# Patient Record
Sex: Male | Born: 1935 | Race: White | Hispanic: No | Marital: Married | Smoking: Never smoker
Health system: Southern US, Community
[De-identification: ages and names within clinical notes are randomized; demographics above are authoritative.]

## PROBLEM LIST (undated history)

## (undated) DIAGNOSIS — Z974 Presence of external hearing-aid: Secondary | ICD-10-CM

## (undated) DIAGNOSIS — C439 Malignant melanoma of skin, unspecified: Secondary | ICD-10-CM

## (undated) DIAGNOSIS — Z973 Presence of spectacles and contact lenses: Secondary | ICD-10-CM

## (undated) DIAGNOSIS — E7800 Pure hypercholesterolemia, unspecified: Secondary | ICD-10-CM

## (undated) DIAGNOSIS — L570 Actinic keratosis: Secondary | ICD-10-CM

## (undated) DIAGNOSIS — Z972 Presence of dental prosthetic device (complete) (partial): Secondary | ICD-10-CM

## (undated) DIAGNOSIS — K635 Polyp of colon: Secondary | ICD-10-CM

## (undated) DIAGNOSIS — K8020 Calculus of gallbladder without cholecystitis without obstruction: Secondary | ICD-10-CM

## (undated) DIAGNOSIS — R809 Proteinuria, unspecified: Secondary | ICD-10-CM

## (undated) DIAGNOSIS — S46119A Strain of muscle, fascia and tendon of long head of biceps, unspecified arm, initial encounter: Secondary | ICD-10-CM

## (undated) DIAGNOSIS — N529 Male erectile dysfunction, unspecified: Secondary | ICD-10-CM

## (undated) DIAGNOSIS — K449 Diaphragmatic hernia without obstruction or gangrene: Secondary | ICD-10-CM

## (undated) DIAGNOSIS — I4891 Unspecified atrial fibrillation: Secondary | ICD-10-CM

## (undated) DIAGNOSIS — K5790 Diverticulosis of intestine, part unspecified, without perforation or abscess without bleeding: Secondary | ICD-10-CM

## (undated) DIAGNOSIS — N400 Enlarged prostate without lower urinary tract symptoms: Secondary | ICD-10-CM

## (undated) HISTORY — DX: Actinic keratosis: L57.0

## (undated) HISTORY — PX: CATARACT EXTRACTION: SUR2

## (undated) HISTORY — DX: Male erectile dysfunction, unspecified: N52.9

## (undated) HISTORY — PX: COLONOSCOPY: SHX174

## (undated) HISTORY — DX: Malignant melanoma of skin, unspecified: C43.9

## (undated) HISTORY — DX: Pure hypercholesterolemia, unspecified: E78.00

## (undated) HISTORY — DX: Proteinuria, unspecified: R80.9

## (undated) HISTORY — DX: Benign prostatic hyperplasia without lower urinary tract symptoms: N40.0

## (undated) HISTORY — DX: Diaphragmatic hernia without obstruction or gangrene: K44.9

## (undated) HISTORY — DX: Strain of muscle, fascia and tendon of long head of biceps, unspecified arm, initial encounter: S46.119A

## (undated) HISTORY — DX: Polyp of colon: K63.5

---
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
## 1987-02-25 HISTORY — PX: INGUINAL HERNIA REPAIR: SHX194

## 2002-10-11 ENCOUNTER — Encounter (INDEPENDENT_AMBULATORY_CARE_PROVIDER_SITE_OTHER): Payer: Self-pay | Admitting: Specialist

## 2002-10-11 ENCOUNTER — Ambulatory Visit (HOSPITAL_COMMUNITY): Admission: RE | Admit: 2002-10-11 | Discharge: 2002-10-11 | Payer: Self-pay | Admitting: Gastroenterology

## 2003-02-25 HISTORY — PX: TOTAL KNEE ARTHROPLASTY: SHX125

## 2003-03-08 ENCOUNTER — Inpatient Hospital Stay (HOSPITAL_COMMUNITY): Admission: RE | Admit: 2003-03-08 | Discharge: 2003-03-13 | Payer: Self-pay | Admitting: Orthopedic Surgery

## 2004-02-25 DIAGNOSIS — E7800 Pure hypercholesterolemia, unspecified: Secondary | ICD-10-CM

## 2004-02-25 HISTORY — DX: Pure hypercholesterolemia, unspecified: E78.00

## 2009-01-24 DIAGNOSIS — S46119A Strain of muscle, fascia and tendon of long head of biceps, unspecified arm, initial encounter: Secondary | ICD-10-CM

## 2009-01-24 HISTORY — DX: Strain of muscle, fascia and tendon of long head of biceps, unspecified arm, initial encounter: S46.119A

## 2010-02-24 HISTORY — PX: TOTAL KNEE ARTHROPLASTY: SHX125

## 2010-03-25 LAB — DIFFERENTIAL
Basophils Relative: 0 % (ref 0–1)
Eosinophils Absolute: 0.1 10*3/uL (ref 0.0–0.7)
Eosinophils Relative: 2 % (ref 0–5)
Lymphs Abs: 1.2 10*3/uL (ref 0.7–4.0)
Monocytes Relative: 7 % (ref 3–12)

## 2010-03-25 LAB — COMPREHENSIVE METABOLIC PANEL
Albumin: 3.9 g/dL (ref 3.5–5.2)
Alkaline Phosphatase: 70 U/L (ref 39–117)
BUN: 19 mg/dL (ref 6–23)
CO2: 26 mEq/L (ref 19–32)
Chloride: 105 mEq/L (ref 96–112)
Glucose, Bld: 108 mg/dL — ABNORMAL HIGH (ref 70–99)
Potassium: 4 mEq/L (ref 3.5–5.1)
Total Bilirubin: 0.6 mg/dL (ref 0.3–1.2)

## 2010-03-25 LAB — CBC
MCH: 31.9 pg (ref 26.0–34.0)
MCHC: 34.8 g/dL (ref 30.0–36.0)
MCV: 91.5 fL (ref 78.0–100.0)
Platelets: 201 10*3/uL (ref 150–400)
RDW: 12.6 % (ref 11.5–15.5)

## 2010-03-25 LAB — URINALYSIS, ROUTINE W REFLEX MICROSCOPIC
Bilirubin Urine: NEGATIVE
Ketones, ur: NEGATIVE mg/dL
Nitrite: NEGATIVE
Protein, ur: NEGATIVE mg/dL
Urobilinogen, UA: 0.2 mg/dL (ref 0.0–1.0)

## 2010-03-25 LAB — SURGICAL PCR SCREEN: MRSA, PCR: NEGATIVE

## 2010-03-25 LAB — APTT: aPTT: 28 seconds (ref 24–37)

## 2010-04-03 ENCOUNTER — Inpatient Hospital Stay (HOSPITAL_COMMUNITY): Payer: Medicare Other

## 2010-04-03 ENCOUNTER — Inpatient Hospital Stay (HOSPITAL_COMMUNITY)
Admission: RE | Admit: 2010-04-03 | Discharge: 2010-04-08 | DRG: 470 | Disposition: A | Discharge status: Home Health | Payer: Medicare Other | Attending: Orthopedic Surgery | Admitting: Orthopedic Surgery

## 2010-04-03 DIAGNOSIS — Z96659 Presence of unspecified artificial knee joint: Secondary | ICD-10-CM

## 2010-04-03 DIAGNOSIS — E119 Type 2 diabetes mellitus without complications: Secondary | ICD-10-CM | POA: Diagnosis present

## 2010-04-03 DIAGNOSIS — M1710 Unilateral primary osteoarthritis, unspecified knee: Principal | ICD-10-CM | POA: Diagnosis present

## 2010-04-03 DIAGNOSIS — E7800 Pure hypercholesterolemia, unspecified: Secondary | ICD-10-CM | POA: Diagnosis present

## 2010-04-03 DIAGNOSIS — M959 Acquired deformity of musculoskeletal system, unspecified: Secondary | ICD-10-CM | POA: Diagnosis present

## 2010-04-03 DIAGNOSIS — I10 Essential (primary) hypertension: Secondary | ICD-10-CM | POA: Diagnosis present

## 2010-04-03 LAB — ABO/RH: ABO/RH(D): A POS

## 2010-04-03 LAB — GLUCOSE, CAPILLARY: Glucose-Capillary: 180 mg/dL — ABNORMAL HIGH (ref 70–99)

## 2010-04-03 LAB — HEMOGLOBIN AND HEMATOCRIT, BLOOD
HCT: 36.3 % — ABNORMAL LOW (ref 39.0–52.0)
Hemoglobin: 13 g/dL (ref 13.0–17.0)

## 2010-04-04 LAB — PROTIME-INR
INR: 1.18 (ref 0.00–1.49)
Prothrombin Time: 15.2 seconds (ref 11.6–15.2)

## 2010-04-04 LAB — HEMOGLOBIN AND HEMATOCRIT, BLOOD: Hemoglobin: 10.9 g/dL — ABNORMAL LOW (ref 13.0–17.0)

## 2010-04-04 LAB — GLUCOSE, CAPILLARY

## 2010-04-05 LAB — GLUCOSE, CAPILLARY: Glucose-Capillary: 123 mg/dL — ABNORMAL HIGH (ref 70–99)

## 2010-04-06 LAB — PROTIME-INR
INR: 1.73 — ABNORMAL HIGH (ref 0.00–1.49)
Prothrombin Time: 20.4 seconds — ABNORMAL HIGH (ref 11.6–15.2)

## 2010-04-07 LAB — PROTIME-INR
INR: 1.75 — ABNORMAL HIGH (ref 0.00–1.49)
Prothrombin Time: 20.6 seconds — ABNORMAL HIGH (ref 11.6–15.2)

## 2010-04-07 LAB — GLUCOSE, CAPILLARY
Glucose-Capillary: 209 mg/dL — ABNORMAL HIGH (ref 70–99)
Glucose-Capillary: 165 mg/dL — ABNORMAL HIGH (ref 70–99)
Glucose-Capillary: 147 mg/dL — ABNORMAL HIGH (ref 70–99)
Glucose-Capillary: 144 mg/dL — ABNORMAL HIGH (ref 70–99)
Glucose-Capillary: 141 mg/dL — ABNORMAL HIGH (ref 70–99)
Glucose-Capillary: 109 mg/dL — ABNORMAL HIGH (ref 70–99)
Glucose-Capillary: 128 mg/dL — ABNORMAL HIGH (ref 70–99)
Glucose-Capillary: 131 mg/dL — ABNORMAL HIGH (ref 70–99)
Glucose-Capillary: 140 mg/dL — ABNORMAL HIGH (ref 70–99)

## 2010-04-08 LAB — PROTIME-INR: INR: 2.03 — ABNORMAL HIGH (ref 0.00–1.49)

## 2010-04-08 LAB — GLUCOSE, CAPILLARY: Glucose-Capillary: 147 mg/dL — ABNORMAL HIGH (ref 70–99)

## 2010-04-08 NOTE — H&P (Addendum)
NAME:  Andre Walker, Andre Walker                 ACCOUNT NO.:  [id number]  MEDICAL RECORD NO.:  [id number]           PATIENT TYPE:  I  LOCATION:  1S                           FACILITY:  [facility]  PHYSICIAN:  Andre Walker. Gioffre, M.D.DATE OF BIRTH:  [birth year]  DATE OF ADMISSION:  03/15/2010 DATE OF DISCHARGE:                             HISTORY & PHYSICAL   CHIEF COMPLAINT:  Right knee pain.  BRIEF HISTORY:  Andre Walker has been followed by Dr. Darrelyn Walker for worsening pain in his right knee.  He had left total knee done in 2005 and he has done excellent with that.  The right knee has continued to worsen.  He has failed conservative treatment.  He is now to a point where he feels it is keeping him from doing things he would like to do and he presents for right total knee arthroplasty.  Andre Walker has been cleared for surgery by his primary care physician, Dr. Cain Walker.  MEDICATION ALLERGIES:  PENICILLIN.  The patient denies food, latex ormetal allergies.  CURRENT MEDICATIONS: 1. Metformin 500 mg 1 tablet p.o. b.i.d. 2. Simvastatin 20 mg 1 tablet p.o. daily. 3. Indomethacin 25 mg 2 tablets p.o. b.i.d.  He will discontinue this     5 days prior to surgery. 4. Terazosin 5 mg. 5. Lisinopril 5 mg.  PAST MEDICAL HISTORY: 1. End-stage arthritis of the right knee. 2. Hypertension. 3. Non-insulin dependent diabetes. 4. Hypercholesterolemia. 5. Arthritis.  PAST SURGICAL HISTORY: 1. Left total knee arthroplasty. 2. Hernia repair. 3. Appendectomy.  FAMILY HISTORY:  Father passed at the age of 83 in a car accident. Mother passed at the age of 61 also with a car accident.  SOCIAL HISTORY:  The patient is married.  He is retired.  He denies past or present use of alcohol or tobacco products.  He has 2 children.  He lives at home with his wife.  He does plan to go home following his hospital stay.  REVIEW OF SYSTEMS:  GENERAL:  Negative for fevers, chills or weight change.  HEENT/NEURO:  Negative  headache, blurred vision or insomnia. DERMATOLOGIC:  Negative for rash or lesion.  RESPIRATORY:  Negative for shortness of breath at rest or with exertion.  CARDIOVASCULAR:  Negative for chest pain or palpitations.  GI: Negative for nausea, vomiting or diarrhea.  GU: Negative for hematuria or dysuria.  MUSCULOSKELETAL: Positive for joint pain.  PHYSICAL EXAMINATION:  VITAL SIGNS:  Pulse 80, respirations 18, blood pressure 144/60 in the left arm. GENERAL:  Andre Walker is a pleasant 77 year old male, who is a stated height of 6 feet and a stated weight of 198 pounds.  He is alert and oriented x3, in no apparent distress. HEENT: Normocephalic, atraumatic.  Extraocular movements intact. NECK:  Supple.  Full range of motion without lymphadenopathy. CHEST:  Lungs are clear to auscultation bilaterally without wheezing. HEART:  Regular rate and rhythm without murmur.  S1, S2 sounds are appreciated. ABDOMEN: Bowel sounds present in all 4 quadrants.  Abdomen is soft, nontender, and nondistended to palpation. EXTREMITIES:  Right knee, he has moderate knee effusion.  No erythema.  Tenderness with palpation over the medial joint line.  Decreased range of motion, moderate crepitus throughout the range. SKIN:  Unremarkable. NEUROLOGIC:  Intact. PERIPHERAL VASCULAR:  Carotid pulses 2+ bilaterally without bruit.  RADIOGRAPHS:  AP and lateral view of the right knee reveals complete collapse of the medial joint space as well as patellofemoral involvement.  IMPRESSION:  End-stage arthritis of the right knee.  PLAN:  Right total knee arthroplasty to be performed by Dr. Darrelyn Walker.     Andre Walker, PAC   ______________________________ Andre Walker Andre Walker, M.D.    LD/MEDQ  D:  04/02/2010  T:  04/02/2010  Job:  [id number]  cc:   Andre Walker Fax: [phone]  Electronically Signed by Andre Walker M.D. on 04/08/2010 09:03:20 AM Electronically Signed by Andre Walker  on 04/08/2010 09:21:25 AM

## 2010-04-08 NOTE — Op Note (Signed)
NAME:  Andre Walker, Andre Walker                 ACCOUNT NO.:  [id number]  MEDICAL RECORD NO.:  [id number]           PATIENT TYPE:  I  LOCATION:  0005                         FACILITY:  [facility]  PHYSICIAN:  Georges Lynch. Virgia Kelner, M.D.DATE OF BIRTH:  1932-11-14  DATE OF PROCEDURE:  04/03/2010 DATE OF DISCHARGE:                              OPERATIVE REPORT   SURGEON:  Georges Lynch. Darrelyn Hillock, M.D.  ASSISTANT:  Rozell Searing, [facility]  PREOPERATIVE DIAGNOSIS:  Severe degenerative arthritis to the right knee with a varus deformity.  POSTOPERATIVE DIAGNOSIS:  Severe degenerative arthritis to the right knee with a varus deformity.  OPERATION:  Right total knee arthroplasty utilizing the DePuy system. The sizes used was a size patella 41 mm with 3 pegs.  The tibial tray was a size 5 cemented, all components were cemented by the way.  The tibial insert was a size 5, 12.5-mm thickness.  The femoral component was a size 5 right.  I did use gentamicin in the cement.  Also, we utilized some of the intercondylar cancellous bone in the bone graft up into the femoral canal as he had a very small few millimeters penetration of the medial femoral cortex.  DESCRIPTION OF PROCEDURE:  Under general anesthesia, routine orthopedic prepping and draping of the right lower extremity was carried out. Initially, he had an attempt by the anesthesiologist, Dr. Shireen Quan to do a femoral block on the right.  Apparently, the block was aborted as it was not satisfactory.  Following that, in the holding area, I did mark the appropriate leg.  The patient was on the proper table with the sterile prep and draping carried out in the right lower extremity.  He had 2 g IV Ancef.  This time, the leg was exsanguinated with Esmarch, tourniquet was elevated at 300 mmHg.  Prior to making the incision, the appropriate time-out was carried out.  Following that, the leg was exsanguinated with Esmarch, tourniquet was elevated to 300 mmHg.  I  then flexed the knee and made an anterior approach in the knee.  Bleeders were identified and cauterized.  I then carried out a median parapatellar approach, reflecting the patella laterally, flexed the knee, and did excise the anterior posterior cruciate ligaments as well as the medial lateral menisci.  I then removed 12 mm thickness off of the distal femur by utilizing intramedullary guide, and note, I believe there was a small penetration of that medial cortex, did bring the C-arm in to check, and there was a question about it, so I just inserted some bone graft material up into the canal medially just to make sure.  After the femur was prepared, we did measure the femur to be a size 5 right femur.  We then prepared the tibial plateau, removed 4 mm off the affected medial side of the tibial plateau after the plateau cut was made.  We then inserted our spacer blocks and checked for flexion and extension.  We had excellent stability and good motion.  I then removed the spacer blocks and then used my intramedullary guide for the tibia and made my appropriate keel cut  after we removed 4 mm thickness off the medial side tibia.  Note, we had a nice even cut of the tibia.  The tibia was smooth and even.  After we did that, we cut our keel cut.  We made our flexion and extension gap measurements after we cut our tibial plateau obviously.  I then went on and prepared the main part of the tibia by cutting my keel cut in the usual fashion.  Following that, I cut my notch cut out of the distal femur.  All temporary components were inserted, and we had excellent function with a 12.5-mm thickness tibial insert.  We did try both the 10-mm thickness and a 12.5.  I then did the resurfacing procedure on the patella.  Three drill holes were made in the patella for a size 41-mm patella.  Following that, we then removed all trial components and cemented all 3 components in simultaneously. As I mentioned  prior to doing that, I inserted 3 pieces of bone graft up into the femoral canal at the appropriate distance and then inserted my thrombin-soaked Gelfoam to prevent any leakage of cement.  All 3 components were cemented.  Then, the loose pieces of cement were finally removed after the cement was hardened.  We water picked out the knee and searched for cement posteriorly as well.  At that particular point, I then injected 10 mL of Surgiflo into the knee, closed the knee layered in the usual fashion over a Hemovac drain.  Tourniquet was let down prior to closing the wound.  We had good control of the bleeding.  The subcutaneous was closed with 0 Vicryl, skin with metal staples. Neosporin bundle dressing was applied.          ______________________________ Georges Lynch Darrelyn Hillock, M.D.     RAG/MEDQ  D:  04/03/2010  T:  04/03/2010  Job:  [id number]  Electronically Signed by Ranee Gosselin M.D. on 04/08/2010 09:03:21 AM

## 2010-05-06 NOTE — Discharge Summary (Addendum)
NAME:  Andre Walker, Andre Walker                 ACCOUNT NO.:  [id number]  MEDICAL RECORD NO.:  [id number]           PATIENT TYPE:  I  LOCATION:  1603                         FACILITY:  [facility]  PHYSICIAN:  Georges Lynch. Deah Ottaway, M.D.DATE OF BIRTH:  04/15/1932  DATE OF ADMISSION:  04/03/2010 DATE OF DISCHARGE:  04/08/2010                              DISCHARGE SUMMARY   ADMITTING DIAGNOSES: 1. End-stage arthritis of the right knee. 2. Hypertension. 3. Non-insulin dependent diabetes. 4. Hypercholesterolemia. 5. Arthritis.  DISCHARGE DIAGNOSES: 1. End-stage arthritis of the right knee status post right total knee     arthroplasty. 2. Hypertension. 3. Non-insulin-dependent diabetes. 4. Hypercholesterolemia. 5. Arthritis.  LABORATORY DATA:  Preoperative CBC revealed white count of 6.1, hemoglobin 13.8, hematocrit 39.6, and a platelet count of 201. Preoperative INR is 1.01.  Preoperative chemistry panel reveals very slightly elevated glucose at 108.  Preoperative urinalysis is unremarkable.  Preoperative PCR for MRSA and Staph aureus are both negative.  On postoperative day #1, the patient's hemoglobin had dropped to 10.9.  His INR had reached 1.18 on postoperative day 2.  His hemoglobin had dropped to 9.4.  His INR had reached 1.68.  Postoperative day #3, the patient's INR was at 1.73.  Postoperative day #4, it was 1.75.  On postop day #5, it was therapeutic at 2.03.  The patient's vital signs remained stable throughout his hospital stay.  PROCEDURE:  On April 03, 2010, Andre Walker was taken to the operating room by surgeon Dr. Ranee Gosselin, assistant Rozell Searing, PA-C.  He underwent right total knee arthroplasty.  Routine orthopedic prep and drape were carried out.  The procedure was performed under general anesthesia.  The patient did receive IV antibiotics preoperatively. There were no complications with the procedure.  The patient was returned to the recovery room in satisfactory  condition.  Postoperative radiographs revealed right total knee arthroplasty with expected postoperative changes.  HOSPITAL COURSE:  Andre Walker was admitted to [facility] on April 03, 2010.  He underwent the above-stated procedure without complication.  After adequate time in the recovery room, he was then taken to the orthopedic floor on PCA, reduced dose Dilaudid as well as muscle relaxants for pain control.  On operative day, he was started on Coumadin and heparin per pharmacy protocol.  On postoperative day #1, the patient's Foley was discontinued as was the reduced dose Dilaudid PCA.  Physical therapy was started.  On postoperative day #2, dressing was changed and wound appeared to be well-healing, no signs of infection.  On postop day #3, the patient continued to progress.  The patient was discharged from [facility] on April 08, 2010.  DISPOSITION:  Disposition to home on April 08, 2010.  DISCHARGE MEDICATIONS: 1. Acetaminophen. 2. Calcium carbonate. 3. Maalox. 4. Metformin. 5. Methocarbamol. 6. Multivitamin. 7. Simvastatin. 8. Terazosin. 9. Coumadin. 10.Calcium citrate. 11.CoQ10. 12.Glucosamine. 13.Lisinopril. 14.Percocet.  ACTIVITY:  The patient is to increase activity slowly.  He is to ambulate with a walker with assistance.  He is able to shower.  WOUND CARE:  He will need daily dressing change.  SPECIAL INSTRUCTIONS:  [name] will come out to do physical therapy and to monitor the patient's INR and dose of Coumadin appropriately.  FOLLOWUP:  Andre Walker will follow up with Dr. Darrelyn Hillock in the office 2 weeks from the day of surgery.  CONDITION ON DISCHARGE:  Improving.     Rozell Searing, PAC   ______________________________ Georges Lynch Darrelyn Hillock, M.D.    LD/MEDQ  D:  05/03/2010  T:  05/03/2010  Job:  [id number]  Electronically Signed by Rozell Searing  on 05/06/2010 08:53:26 AM Electronically Signed by Ranee Gosselin  M.D. on 05/08/2010 08:29:11 AM

## 2010-05-29 ENCOUNTER — Ambulatory Visit (INDEPENDENT_AMBULATORY_CARE_PROVIDER_SITE_OTHER): Payer: Medicare Other | Admitting: Family Medicine

## 2010-05-29 DIAGNOSIS — Z79899 Other long term (current) drug therapy: Secondary | ICD-10-CM

## 2010-05-29 DIAGNOSIS — E7800 Pure hypercholesterolemia, unspecified: Secondary | ICD-10-CM

## 2010-05-29 DIAGNOSIS — Z125 Encounter for screening for malignant neoplasm of prostate: Secondary | ICD-10-CM

## 2010-05-29 DIAGNOSIS — E119 Type 2 diabetes mellitus without complications: Secondary | ICD-10-CM

## 2010-06-06 ENCOUNTER — Ambulatory Visit: Payer: Medicare Other

## 2010-07-12 NOTE — Op Note (Signed)
NAME:  Andre Walker, Andre Walker                           ACCOUNT NO.:  [id number]   MEDICAL RECORD NO.:  [id number]                   PATIENT TYPE:  INP   LOCATION:  X002                                 FACILITY:  [facility]   PHYSICIAN:  Georges Lynch. Darrelyn Hillock, M.D.             DATE OF BIRTH:  1932-04-13   DATE OF PROCEDURE:  03/07/2002  DATE OF DISCHARGE:                                 OPERATIVE REPORT   SURGEON:  Georges Lynch. Darrelyn Hillock, M.D.   ASSISTANT:  Ebbie Ridge. Paitsel, P.A.   PREOPERATIVE DIAGNOSES:  Degenerative arthritis of his left knee with a genu  varus deformity.   POSTOPERATIVE DIAGNOSES:  Degenerative arthritis of his left knee with a  genu varus deformity.   OPERATION:  Osteonics left total knee arthroplasty utilizing a size 28 mm  patella, a size 11 left posterior cruciate stabilized femoral component, a  size 9 tibial tray with a 12 mm thickness insert.  Note, all three  components were cemented, and I did use vancomycin in the cement.   DESCRIPTION OF PROCEDURE:  First I attempted a spinal anesthesia and then a  general anesthesia and apparently he had no response to the spinal, so they  decided to give him a general anesthetic.  Following this, a routine  orthopedic prep draping of the left lower extremity was carried out.  The  leg was exsanguinated with an Esmarch, and the tourniquet was elevated to  350 mmHg.  At this time, an incision was made over the anterior aspect of  the left knee.  Bleeders were identified and cauterized.  Two flaps were  created.  I then carried out a median parapatellar approach to the left  knee, and the patella was reflected laterally, and all of the spurs were  removed.  I flexed the knee and did lateral medial meniscectomies and  excised the anterior-posterior cruciate ligaments.  His knee was quite  tight, so I did a nice medial release on him.  Following this, initial drill  hole was made in the intercondylar notch.  The #1 jig was inserted, and  we  removed 10 mm thickness off the distal femur.  A #2 jig then was inserted  for a size 11 left femur, and we did our anterior, posterior, and chamfering  cuts.  Following this, we then prepared the tibia by removing 4 mm thickness  off of the affected medial side of the tibia.  We then tried our trial  components and felt that a 12 mm thickness tray was the most stable.  We  then cut our patella, removed 10 mm thickness off the articular surface of  the patella.  Three drill holes were made in the patella for a size 28  patella.  Following this, we then thoroughly irrigated out the knee.  We  initially cut our groove cut before we did any trials.  We cut our patellar  notch cut and the intercondylar notch cut.  We cut that out prior to going  through our trials.  After this, we then cut our keel cut out of the  proximal tibia in the usual fashion for a size 9 tray.  We then thoroughly  waterpicked out the knee and cemented all three components in  simultaneously.  We thoroughly checked to make sure there were no loose  pieces of the cement.  We first tried a size 10 mm thickness tibial tray and  then went to a 12.  The 12 was the most stable.  We then inserted our  permanent tray after we made sure everything was cleaned out of the joint.  We then reduced the knee, went through range of motion, and had excellent  motion.  We then carried out a lateral release.  Note, we did use vancomycin  in the cement, as I mentioned.  After all three  components were cemented and the cement was dry, we then inserted a Hemovac  drain and closed the knee in layers in the usual fashion.  Sterile Neosporin  dressing were applied.  The patient left the operating room in satisfactory  condition.  He had 1 g of IV Ancef preop.                                               Ronald A. Darrelyn Hillock, M.D.    RAG/MEDQ  D:  03/08/2003  T:  03/08/2003  Job:  [id number]

## 2010-07-12 NOTE — H&P (Signed)
NAME:  Andre Walker, Andre Walker                             ACCOUNT NO.:  [id number]   MEDICAL RECORD NO.:  [id number]                    PATIENT TYPE:   LOCATION:                                       FACILITY:   PHYSICIAN:  Georges Lynch. Gioffre, M.D.             DATE OF BIRTH:  1932/11/25   DATE OF ADMISSION:  03/08/2003  DATE OF DISCHARGE:                                HISTORY & PHYSICAL   HISTORY:  The patient has had left knee pain for the past four to five  years.  Over the past several months he has had increasing pain.  He has  tried Celebrex in the past with no relief.  He has also had multiple  cortisone injections in this left knee without relief.  Due to his increased  pain and difficulty ambulating, he has elected to proceed with a left total  knee arthroplasty.   PAST MEDICAL HISTORY:  1. Degenerative arthritis.  2. Non-insulin-dependent diabetes, diet controlled.   ALLERGIES:  PENICILLIN causes a rash.   PRIMARY CARE PHYSICIAN:  Dr. Margrett Rud   CURRENT MEDICATIONS:  1. Hytrin 5 mg daily.  2. Celebrex 200 mg b.i.d.   FAMILY HISTORY:  Brother has coronary artery disease and diabetes.  Sister  also has diabetes.   REVIEW OF SYSTEMS:  GENERAL:  Denies weight change, fever, chills, fatigue.  HEENT:  Denies headache, visual changes, tinnitus, hearing loss, sore  throat.  CARDIOVASCULAR:  Denies chest pain, palpitations, shortness of  breath, orthopnea.  PULMONARY:  Denies dyspnea, wheezing, cough, sputum  production, hemoptysis.  GASTROINTESTINAL:  Denies dysphagia, nausea,  vomiting, hematemesis, abdominal pain.  GENITOURINARY:  Denies dysuria,  frequency, urgency, hematuria.  ENDOCRINE:  Denies polyuria, polydipsia,  appetite change, heat or cold intolerance.  MUSCULOSKELETAL:  The patient  has left knee pain, but denies weakness or numbness.  SKIN:  Denies itching,  rashes, masses, or moles.   PHYSICAL EXAMINATION:  VITAL SIGNS:  Temperature 98.3, pulse 62,  respirations 18, blood pressure 130/68 right arm sitting.  GENERAL:  A 77 year old male in no acute distress.  HEENT:  PERRL.  EOMs intact.  Pharynx clear.  TMs intact.  NECK:  Supple without masses.  CHEST:  Clear to auscultation bilaterally.  No wheezes, rales, or rhonchi  noted.  HEART:  Regular rate and rhythm without murmur.  ABDOMEN:  Positive bowel sounds, soft, nontender.  EXTREMITIES:  Examination of his left knee reveals decreased range of  motion, crepitus with range of motion and he lacks complete flexion.  SKIN:  Warm and dry.   LABORATORIES:  X-rays of his left knee show collapse of the medial joint  space with arthritic changes in the lateral aspect with spurring.   IMPRESSION:  Degenerative arthritis left knee.   PLAN:  The patient is to be admitted to [facility] on March 08, 2003 to undergo a left total knee  arthroplasty.     Ebbie Ridge. Paitsel, P.A.                     Ronald A. Darrelyn Hillock, M.D.    Tilden Dome  D:  02/28/2003  T:  02/28/2003  Job:  [id number]

## 2010-07-12 NOTE — Op Note (Signed)
NAME:  Andre Walker, Andre Walker                           ACCOUNT NO.:  [id number]   MEDICAL RECORD NO.:  [id number]                   PATIENT TYPE:  AMB   LOCATION:  ENDO                                 FACILITY:  [facility]   PHYSICIAN:  Petra Kuba, M.D.                 DATE OF BIRTH:  1933-01-15   DATE OF PROCEDURE:  10/11/2002  DATE OF DISCHARGE:                                 OPERATIVE REPORT   PROCEDURE:  Colonoscopy with polypectomy.   INDICATIONS FOR PROCEDURE:  Colon screening.   Consent was signed after risks, benefits, methods, and options were  thoroughly discussed in the office.   MEDICINES USED:  Demerol 70, Versed 8.   DESCRIPTION OF PROCEDURE:  Rectal inspection was pertinent for small  external hemorrhoids. Digital exam was negative. The video colonoscope was  inserted, easily advanced around the colon to the cecum. This required some  abdominal pressure but no position changes. No obvious abnormality was seen  on insertion. The cecum was identified by the appendiceal orifice and the  ileocecal valve. The prep was adequate. There was some liquid stool that  required washing and suctioning. On slow withdrawal through the colon, two  tiny to small ascending polyps were seen and were hot biopsied x1 and put in  the first container. A more distal transverse polyp was seen and was also  hot biopsied x1 and put in the same container, the scope was slowly  withdrawn. In the distal sigmoid and rectum, three tiny polyps were seen and  were all hot biopsied and put in the second container. No other  abnormalities were seen as we slowly withdrew back to the rectum. - Anal  rectal pull through and retroflexion confirmed some hemorrhoids, small.  The  scope was reinserted a short ways up the left side of the colon, air was  suctioned, scope removed. The patient tolerated the procedure well. There  was no obvious or immediate complications.   ENDOSCOPIC DIAGNOSES:  1. Internal and  external small hemorrhoids.  2. Three tiny rectal and distal sigmoid polyps hot biopsied.  3. Three transverse and ascending tiny to small polyps hot biopsied.  4. Otherwise within normal limits to the cecum.    PLAN:  Await pathology to determine future colonic screening. Happy to see  back p.r.n. otherwise return care to Dr. Andrey Campanile for the customary health  care maintenance to include yearly rectals and guaiacs.                                               Petra Kuba, M.D.    MEM/MEDQ  D:  10/11/2002  T:  10/11/2002  Job:  [id number]   cc:   [place]ale Haven. Andrey Campanile, M.D.  [address]  [place]  [place] [address]  Fax: [phone]

## 2010-07-12 NOTE — Discharge Summary (Signed)
NAME:  Andre Walker, Andre Walker                           ACCOUNT NO.:  [id number]   MEDICAL RECORD NO.:  [id number]                   PATIENT TYPE:  INP   LOCATION:  0480                                 FACILITY:  [facility]   PHYSICIAN:  Georges Lynch. Darrelyn Walker, M.D.             DATE OF BIRTH:  1932/07/17   DATE OF ADMISSION:  03/08/2003  DATE OF DISCHARGE:  03/13/2003                                 DISCHARGE SUMMARY   ADMISSION DIAGNOSES:  1. Degenerative arthritis, left knee.  2. Noninsulin-dependent diabetes.   DISCHARGE DIAGNOSES:  1. Degenerative arthritis, left knee, status post left total knee     arthroplasty.  2. Noninsulin-dependent diabetes.   PROCEDURES:  The patient was taken to the operating room on March 08, 2003, to undergo a left total knee arthroplasty.  Surgeon Ranee Gosselin,  M.D.  Assistant Terie Purser, P.A.-C.  The surgery was performed under  spinal anesthesia.  A Hemovac drain was placed at the time of surgery.   CONSULTS:  1. Physical therapy.  2. Occupational therapy.  3. Rehab.   BRIEF HISTORY:  This is a 77 year old male, who has had left knee pain for  the past 4-5 years and for the past several months, he has had increasing  pain.  He has tried Celebrex and other nonsteroidal anti-inflammatory  medications in the past without relief.  He has had multiple cortisone  injections in his left knee with no relief.  Due to the increased pain and  difficulty ambulating, he has elected to proceed with a left total knee  arthroplasty.   LABORATORY DATA:  Admission CBC:  WBC 7.4, RBC 4.67, hemoglobin 15.2,  hematocrit 43.1, platelet count 268, PT 12.2, INR 0.9, PTT 27.  Chemistry:  Sodium 135, potassium 4.1, chloride 103, CO2 28, glucose elevated at 207,  BUN 25, slightly elevated creatinine at 1.2, calcium 10.1, albumin 4.4.  Urinalysis:  11-20 WBC per high-powered field.  The patient's blood type is  A positive, negative antibody screen.  Preoperative EKGs reveal  normal sinus  rhythm at a rate of 62.  Preadmission chest x-ray:  No acute findings.  Preadmission x-ray of his left knee:  Collapse of the medial joint space  with arthritic changes in the lateral aspect with spurring.  Postoperative x-  ray left knee:  Left total knee prosthesis in good alignment.   HOSPITAL COURSE:  The patient was admitted to [facility] and taken  to the operating room for the above-stated procedure.  He tolerated the  procedure well and was allowed to return to the recovery room and then to  the orthopedic floor to continue his postoperative care.  A Hemovac drain  was placed at the time of surgery.  It was discontinued on postoperative day  one.  The patient was placed on PCA for pain control; the patient was weaned  over to oral analgesics by postoperative day three,  and the PCA was  discontinued.  Hemoglobin and hematocrit was monitored throughout  hospitalization.  There was a postoperative drop in his hemoglobin to 10.1,  but that stabilized prior to discharge, not requiring blood transfusion.  PT/OT and rehab was consulted.  PT showed good progress with gait training.  The patient was able to ambulate greater than 100 feet by postoperative day  five, and it was felt that the patient was not a candidate for rehab and  could go home.   DISPOSITION:  The patient was discharged home on March 13, 2003.   DISCHARGE MEDICATIONS:  1. Percocet 10/650, 1-2 q.6h. p.r.n. pain.  2. Robaxin 500 mg 1 q.6h. p.r.n. muscle spasm.  3. Coumadin 5 mg daily.   DIET:  As tolerated.   ACTIVITY:  Full weightbearing with walker.   HOME CARE:  [facility].   FOLLOW UP:  The patient is scheduled to follow up with Dr. Darrelyn Walker 2 weeks  from the date of the surgery.   CONDITION ON DISCHARGE:  Improved.     Andre Walker, P.A.                     Andre Walker, M.D.    [place] Dome  D:  04/05/2003  T:  04/05/2003  Job:  [id number]

## 2010-07-17 ENCOUNTER — Telehealth (INDEPENDENT_AMBULATORY_CARE_PROVIDER_SITE_OTHER): Payer: Medicare Other | Admitting: Family Medicine

## 2010-07-17 DIAGNOSIS — E7800 Pure hypercholesterolemia, unspecified: Secondary | ICD-10-CM

## 2010-07-17 MED ORDER — SIMVASTATIN 20 MG PO TABS: 20.0000 mg | ORAL_TABLET | Freq: Every day | ORAL | Status: DC

## 2010-07-17 NOTE — Telephone Encounter (Signed)
E-rx'd to pharmacy

## 2010-08-12 ENCOUNTER — Telehealth: Payer: Self-pay | Admitting: *Deleted

## 2010-08-12 ENCOUNTER — Other Ambulatory Visit (INDEPENDENT_AMBULATORY_CARE_PROVIDER_SITE_OTHER): Payer: Medicare Other

## 2010-08-12 DIAGNOSIS — D649 Anemia, unspecified: Secondary | ICD-10-CM

## 2010-08-12 LAB — HEMOCCULT GUIAC POC 1CARD (OFFICE)
Card #2 Fecal Occult Blod, POC: NEGATIVE
Fecal Occult Blood, POC: NEGATIVE

## 2010-08-12 NOTE — Telephone Encounter (Deleted)
Message copied by Melonie Florida on Mon Aug 12, 2010  2:43 PM ------      Message from: KNAPP, EVE      Created: Mon Aug 12, 2010  1:44 PM       Advise pt that 1/3 +hemoccult, 2 were negative. Still likely is related to hemorrhoids, but he may want to leave a message for Dr. Marlane Hatcher office advising them of 1/3 + hemoccult and see if they want to see him sooner than what was previously recommended

## 2010-08-12 NOTE — Telephone Encounter (Signed)
Message copied by Melonie Florida on Mon Aug 12, 2010  2:46 PM ------      Message from: KNAPP, EVE      Created: Mon Aug 12, 2010  1:44 PM       Advise pt that 1/3 +hemoccult, 2 were negative. Still likely is related to hemorrhoids, but he may want to leave a message for Dr. Marlane Hatcher office advising them of 1/3 + hemoccult and see if they want to see him sooner than what was previously recommended

## 2010-08-12 NOTE — Telephone Encounter (Signed)
Left message for patient informing him that 1/3 stool cards were positive and that he may want to give DrMagod's office a call to see if they want him to come in sooner than originally recommended.

## 2010-08-20 ENCOUNTER — Other Ambulatory Visit: Payer: Self-pay | Admitting: Dermatology

## 2010-08-21 ENCOUNTER — Other Ambulatory Visit: Payer: Medicare Other

## 2010-08-22 ENCOUNTER — Telehealth: Payer: Self-pay | Admitting: Family Medicine

## 2010-08-22 NOTE — Telephone Encounter (Signed)
FAXED LAST LABS & STOOL CARD RESULTS TO DR. MAGOD PER PT'S WIFE'S REQUEST-LM 6/28

## 2010-08-23 ENCOUNTER — Encounter: Payer: Self-pay | Admitting: Family Medicine

## 2010-09-09 ENCOUNTER — Other Ambulatory Visit: Payer: Self-pay | Admitting: *Deleted

## 2010-09-09 MED ORDER — LISINOPRIL 5 MG PO TABS: 5.0000 mg | ORAL_TABLET | Freq: Every day | ORAL | Status: DC

## 2010-09-20 ENCOUNTER — Encounter (INDEPENDENT_AMBULATORY_CARE_PROVIDER_SITE_OTHER): Payer: Self-pay | Admitting: Surgery

## 2010-09-20 ENCOUNTER — Ambulatory Visit (INDEPENDENT_AMBULATORY_CARE_PROVIDER_SITE_OTHER): Payer: Medicare Other | Admitting: Surgery

## 2010-09-20 VITALS — BP 118/72 | HR 68 | Ht 72.0 in | Wt 193.5 lb

## 2010-09-20 DIAGNOSIS — D0361 Melanoma in situ of right upper limb, including shoulder: Secondary | ICD-10-CM

## 2010-09-20 DIAGNOSIS — C4360 Malignant melanoma of unspecified upper limb, including shoulder: Secondary | ICD-10-CM

## 2010-09-20 NOTE — Progress Notes (Signed)
[name] came in today to  evaluate Andre Walker' melanoma of his right arm that Andre Walker i biopsied. It's been there for a while and l appears to be a fairly broad-based pigmented lesion about a centimeter and a half in diameter. Andre Walker's biopsy showed this to be a superficial spreading melanoma Clark's level at least 4 Breslow's at least 1.1 because the deep margin was involved.  Interestingly he has had a right biceps tendon ruptures where there is a muscle mass medial. His skin is fairly redundant and I think I might be able to close this primarily although I explained a skin graft if I need to do that.  I listened to his heart and lungs and that all sounds fine. Will set him up for a  lymphoscintigram to assess the right upper extremities drainage and we'll plan to do a excision melanoma and sentinel lymph node mapping and biopsy at [facility].

## 2010-09-23 ENCOUNTER — Other Ambulatory Visit (INDEPENDENT_AMBULATORY_CARE_PROVIDER_SITE_OTHER): Payer: Self-pay | Admitting: Surgery

## 2010-09-23 DIAGNOSIS — D0362 Melanoma in situ of left upper limb, including shoulder: Secondary | ICD-10-CM

## 2010-10-03 ENCOUNTER — Encounter (HOSPITAL_COMMUNITY)
Admission: RE | Admit: 2010-10-03 | Discharge: 2010-10-03 | Disposition: A | Discharge status: Home / Self Care | Payer: Medicare Other | Source: Ambulatory Visit | Attending: Surgery | Admitting: Surgery

## 2010-10-03 DIAGNOSIS — C4360 Malignant melanoma of unspecified upper limb, including shoulder: Secondary | ICD-10-CM | POA: Insufficient documentation

## 2010-10-03 DIAGNOSIS — D0361 Melanoma in situ of right upper limb, including shoulder: Secondary | ICD-10-CM

## 2010-10-03 MED ORDER — TECHNETIUM TC 99M SULFUR COLLOID FILTERED
0.5000 | Freq: Once | INTRAVENOUS | Status: AC | PRN
  Administered 2010-10-03: 0.5 via INTRADERMAL

## 2010-10-04 ENCOUNTER — Ambulatory Visit
Admission: RE | Admit: 2010-10-04 | Discharge: 2010-10-04 | Disposition: A | Discharge status: Home / Self Care | Payer: Medicare Other | Source: Ambulatory Visit | Attending: Surgery | Admitting: Surgery

## 2010-10-04 ENCOUNTER — Encounter (HOSPITAL_BASED_OUTPATIENT_CLINIC_OR_DEPARTMENT_OTHER)
Admission: RE | Admit: 2010-10-04 | Discharge: 2010-10-04 | Disposition: A | Discharge status: Home / Self Care | Payer: Medicare Other | Source: Ambulatory Visit | Attending: Surgery | Admitting: Surgery

## 2010-10-04 ENCOUNTER — Other Ambulatory Visit (INDEPENDENT_AMBULATORY_CARE_PROVIDER_SITE_OTHER): Payer: Self-pay | Admitting: Surgery

## 2010-10-04 DIAGNOSIS — Z01811 Encounter for preprocedural respiratory examination: Secondary | ICD-10-CM

## 2010-10-04 LAB — BASIC METABOLIC PANEL
CO2: 27 mEq/L (ref 19–32)
Calcium: 9.4 mg/dL (ref 8.4–10.5)
Chloride: 101 mEq/L (ref 96–112)
Glucose, Bld: 222 mg/dL — ABNORMAL HIGH (ref 70–99)
Potassium: 4.9 mEq/L (ref 3.5–5.1)
Sodium: 136 mEq/L (ref 135–145)

## 2010-10-08 ENCOUNTER — Ambulatory Visit (HOSPITAL_BASED_OUTPATIENT_CLINIC_OR_DEPARTMENT_OTHER)
Admission: RE | Admit: 2010-10-08 | Discharge: 2010-10-08 | Disposition: A | Discharge status: Home / Self Care | Payer: Medicare Other | Source: Ambulatory Visit | Attending: Surgery | Admitting: Surgery

## 2010-10-08 ENCOUNTER — Ambulatory Visit (HOSPITAL_COMMUNITY)
Admission: RE | Admit: 2010-10-08 | Discharge: 2010-10-08 | Disposition: A | Discharge status: Home / Self Care | Payer: Medicare Other | Source: Ambulatory Visit | Attending: Surgery | Admitting: Surgery

## 2010-10-08 ENCOUNTER — Other Ambulatory Visit (INDEPENDENT_AMBULATORY_CARE_PROVIDER_SITE_OTHER): Payer: Self-pay | Admitting: Surgery

## 2010-10-08 DIAGNOSIS — C4360 Malignant melanoma of unspecified upper limb, including shoulder: Secondary | ICD-10-CM

## 2010-10-08 DIAGNOSIS — E119 Type 2 diabetes mellitus without complications: Secondary | ICD-10-CM | POA: Insufficient documentation

## 2010-10-08 DIAGNOSIS — D0362 Melanoma in situ of left upper limb, including shoulder: Secondary | ICD-10-CM

## 2010-10-08 DIAGNOSIS — E669 Obesity, unspecified: Secondary | ICD-10-CM | POA: Insufficient documentation

## 2010-10-08 DIAGNOSIS — Z01812 Encounter for preprocedural laboratory examination: Secondary | ICD-10-CM | POA: Insufficient documentation

## 2010-10-08 DIAGNOSIS — I10 Essential (primary) hypertension: Secondary | ICD-10-CM | POA: Insufficient documentation

## 2010-10-08 HISTORY — PX: MELANOMA EXCISION: SHX5266

## 2010-10-08 LAB — GLUCOSE, CAPILLARY
Glucose-Capillary: 130 mg/dL — ABNORMAL HIGH (ref 70–99)
Glucose-Capillary: 125 mg/dL — ABNORMAL HIGH (ref 70–99)

## 2010-10-08 MED ORDER — TECHNETIUM TC 99M SULFUR COLLOID FILTERED
0.5000 | Freq: Once | INTRAVENOUS | Status: AC | PRN
  Administered 2010-10-08: 0.5 via INTRADERMAL

## 2010-10-17 ENCOUNTER — Ambulatory Visit (INDEPENDENT_AMBULATORY_CARE_PROVIDER_SITE_OTHER): Payer: Medicare Other | Admitting: Surgery

## 2010-10-17 ENCOUNTER — Encounter (INDEPENDENT_AMBULATORY_CARE_PROVIDER_SITE_OTHER): Payer: Self-pay | Admitting: Surgery

## 2010-10-17 VITALS — BP 122/62 | HR 76

## 2010-10-17 DIAGNOSIS — C439 Malignant melanoma of skin, unspecified: Secondary | ICD-10-CM

## 2010-10-17 NOTE — Progress Notes (Signed)
Mr. Flagg comes in today in followup after his melanoma was excised from his right arm. There was residual melanoma in the excisional specimen. Margins were all negative. The sentinel lymph node biopsy in the right axilla was negative. He is not having any problems the present time. I removed the nylon sutures from his excision site.. Steri-Strips were applied.  His arm is not swollen and it looks good. A copy of his path report was given to them. He did have some ecchymosis beneath his axillary biopsy site. Appears to be doing well. Return in 2 months for checkup. Advised to follow up with Dr. Terri Piedra for ongoing dermatologic screening.

## 2010-10-29 ENCOUNTER — Other Ambulatory Visit: Payer: Self-pay | Admitting: Gastroenterology

## 2010-10-29 HISTORY — PX: ESOPHAGOGASTRODUODENOSCOPY: SHX1529

## 2010-10-30 ENCOUNTER — Telehealth: Payer: Self-pay | Admitting: Family Medicine

## 2010-10-30 DIAGNOSIS — E119 Type 2 diabetes mellitus without complications: Secondary | ICD-10-CM

## 2010-10-30 MED ORDER — METFORMIN HCL ER 500 MG PO TB24: 500.0000 mg | ORAL_TABLET | Freq: Two times a day (BID) | ORAL | Status: DC

## 2010-10-30 MED ORDER — LISINOPRIL 5 MG PO TABS: 5.0000 mg | ORAL_TABLET | Freq: Every day | ORAL | Status: DC

## 2010-10-30 NOTE — Op Note (Signed)
  NAME:  Andre Walker, Andre Walker                 ACCOUNT NO.:  [id number]  MEDICAL RECORD NO.:  [id number]  LOCATION:  [facility]C                          FACILITY:  [facility]  PHYSICIAN:  Thornton Park. Daphine Deutscher, MD  DATE OF BIRTH:  04-Feb-1933  DATE OF PROCEDURE:  10/08/2010 DATE OF DISCHARGE:                              OPERATIVE REPORT   PREOPERATIVE DIAGNOSES:  Melanoma, right arm, Clark level IV, Breslow level indeterminate.  POSTOPERATIVE DIAGNOSES:  Melanoma, right arm, Clark level IV, Breslow level indeterminate.  PROCEDURE:  Right sentinel lymph node mapping and sentinel lymph node biopsy, wide re-excision of melanoma right arm with primary closure.  SURGEON:  Thornton Park. Daphine Deutscher, MD  ANESTHESIA:  General by LMA.  DESCRIPTION OF PROCEDURE:  This 78 year old man had melanoma biopsy by Dr.  Para Skeans.  He was referred and seen.  Informed consent was obtained regarding possible excision with split thicking of the skin graft and axillary sentinel lymph node sampling.  The patient was taken to room 3 [facility] on Tuesday, October 08, 2010, given general in the upper extremity which had  previous been marked and prepped with PCMX and draped sterilely.  I first mapped the axilla and found the hot spot, cut down on this, went deep and found about 2 cm breath node on one part which was hot and blue.  I had previously injected with about 0.5 mL of methylene blue.  I then excised this node using clips to the vascular supply to it and then sent this in toto with a marking suture on the hot part of node and sent it fresh.  The wound was closed 4-0 Vicryl and with Dermabond.  I then excised the lesion using an ellipse that was approximately 10 cm in length and this seemed to go well between 0.5 and 1 cm margin around the edges of the nevus, it had been biopsied by Dr. Terri Piedra.  This excised down and removed off the fascia.  It was marked the long suture proximally and a short suture medially.  The  wound rendered hemostasis. It was closed with 4-0 Vicryl and with a running 5-0 nylon.  The patient was given Vicodin 5/500 to take for pain and will followed up in the office in about 10 days for suture removal.     Molli Hazard B. Daphine Deutscher, MD     MBM/MEDQ  D:  10/08/2010  T:  10/08/2010  Job:  [id number]  cc:   Gelene Mink A. Worthy Rancher, M.D.  Electronically Signed by Luretha Murphy MD on 10/30/2010 01:37:28 PM

## 2010-10-30 NOTE — Telephone Encounter (Signed)
done

## 2010-10-30 NOTE — Telephone Encounter (Signed)
PT NEEDS METFORMIN 500 MG SENT TO [facility] [address]  THIS IS A NEW PHARM.  ALSO NEEDS LISINOPRIL 5 MG SENT IN TO [facility]

## 2010-12-02 ENCOUNTER — Ambulatory Visit (INDEPENDENT_AMBULATORY_CARE_PROVIDER_SITE_OTHER): Payer: Medicare Other | Admitting: Family Medicine

## 2010-12-02 ENCOUNTER — Encounter: Payer: Self-pay | Admitting: Family Medicine

## 2010-12-02 VITALS — BP 122/60 | HR 64 | Ht 72.0 in | Wt 191.0 lb

## 2010-12-02 DIAGNOSIS — Z79899 Other long term (current) drug therapy: Secondary | ICD-10-CM

## 2010-12-02 DIAGNOSIS — E119 Type 2 diabetes mellitus without complications: Secondary | ICD-10-CM

## 2010-12-02 DIAGNOSIS — E7800 Pure hypercholesterolemia, unspecified: Secondary | ICD-10-CM

## 2010-12-02 DIAGNOSIS — R809 Proteinuria, unspecified: Secondary | ICD-10-CM

## 2010-12-02 DIAGNOSIS — Z23 Encounter for immunization: Secondary | ICD-10-CM

## 2010-12-02 LAB — HEPATIC FUNCTION PANEL
AST: 19 U/L (ref 0–37)
Albumin: 4.7 g/dL (ref 3.5–5.2)
Alkaline Phosphatase: 106 U/L (ref 39–117)
Bilirubin, Direct: 0.1 mg/dL (ref 0.0–0.3)
Total Bilirubin: 0.5 mg/dL (ref 0.3–1.2)

## 2010-12-02 LAB — LIPID PANEL
Cholesterol: 143 mg/dL (ref 0–200)
Total CHOL/HDL Ratio: 3.3 Ratio
VLDL: 19 mg/dL (ref 0–40)

## 2010-12-02 LAB — POCT GLYCOSYLATED HEMOGLOBIN (HGB A1C): Hemoglobin A1C: 5.8

## 2010-12-02 LAB — GLUCOSE, RANDOM: Glucose, Bld: 129 mg/dL — ABNORMAL HIGH (ref 70–99)

## 2010-12-02 NOTE — Progress Notes (Signed)
Patient presents for 6 month follow-up.  He is fasting for labs today.  He has no other specific concerns or complaints.  Started taking 2000 mg of fish oil since last visit.  Denies side effects.  His diabetes has been well controlled. He checks his feet regularly and denies numbness or concerns.  He denies headaches, chest pain, edema, or dizziness.  Gets routine eye exams.  He had colonoscopy and EGD since last visit (for +hemoccult), all reportedly normal . Also had melanoma excised since last visit.  Past Medical History  Diagnosis Date  . Diabetes mellitus 2004  . BPH (benign prostatic hyperplasia)     controlled on Hytrin  . ED (erectile dysfunction)   . Colon polyps     Dr. Ewing Schlein  . Microalbuminuria     on ACEI for microalbuminuria (does NOT have HTN)  . Pure hypercholesterolemia 2006  . Actinic keratosis     Dr. Terri Piedra  . Traumatic partial tear of biceps tendon 01/2009    "popeye" injury  . Hearing loss     high frequency  . Hiatal hernia     small, noted on EGD 10/2010    Past Surgical History  Procedure Date  . Total knee arthroplasty 2012    right  . Appendectomy 1955  . Inguinal hernia repair 1989    right  . Total knee arthroplasty 2005    left  . Colonoscopy 11/09, 10/29/10    Dr. Ewing Schlein  . Esophagogastroduodenoscopy 10/29/10    Dr. Ewing Schlein  . Melanoma excision 10/08/10    R upper arm, Dr. Daphine Deutscher    History   Social History  . Marital Status: Married    Spouse Name: N/A    Number of Children: 2  . Years of Education: N/A   Occupational History  . Retired (from post office)    Social History Main Topics  . Smoking status: Never Smoker   . Smokeless tobacco: Never Used  . Alcohol Use: 0.0 oz/week    2-3 Glasses of wine per week     1 glass of wine 1-2 times per month.  . Drug Use: No  . Sexually Active: Not on file   Other Topics Concern  . Not on file   Social History Narrative   1 son in [place], 1 son in [place].  Still does some handyman  work    Family History  Problem Relation Age of Onset  . Diabetes Sister   . Diabetes Brother     Current outpatient prescriptions:aspirin 81 MG tablet, Take 81 mg by mouth daily.  , Disp: , Rfl: ;  Calcium Carbonate-Vitamin D (CALCIUM 600+D HIGH POTENCY) 600-400 MG-UNIT per tablet, Take 2 tablets by mouth daily.  , Disp: , Rfl: ;  Coenzyme Q10 (COQ10) 100 MG CAPS, Take 1 capsule by mouth daily.  , Disp: , Rfl: ;  fish oil-omega-3 fatty acids 1000 MG capsule, Take 2 g by mouth daily.  , Disp: , Rfl:  glucosamine-chondroitin 500-400 MG tablet, Take 2 tablets by mouth daily.  , Disp: , Rfl: ;  lisinopril (PRINIVIL,ZESTRIL) 5 MG tablet, Take 1 tablet (5 mg total) by mouth daily., Disp: 90 tablet, Rfl: 0;  metFORMIN (GLUCOPHAGE-XR) 500 MG 24 hr tablet, Take 1 tablet (500 mg total) by mouth 2 (two) times daily., Disp: 60 tablet, Rfl: 2;  Multiple Vitamins-Minerals (MULTIVITAMIN WITH MINERALS) tablet, Take 1 tablet by mouth daily.  , Disp: , Rfl:  sildenafil (VIAGRA) 100 MG tablet, Take 100 mg by  mouth daily as needed.  , Disp: , Rfl: ;  simvastatin (ZOCOR) 20 MG tablet, Take 1 tablet (20 mg total) by mouth at bedtime., Disp: 90 tablet, Rfl: 1;  terazosin (HYTRIN) 5 MG capsule, Take 5 mg by mouth at bedtime.  , Disp: , Rfl:   Allergies  Allergen Reactions  . Penicillins Rash   ROS:  Denies weight changes, headaches, dizziness, chest pain, URI symptoms, cough, SOB, GI complaints, denies heartburn, blood in stool, skin rashes or other complaints.  Denies numbness, tingling, back pain  PHYSICAL EXAM: BP 122/60  Pulse 64  Ht 6' (1.829 m)  Wt 191 lb (86.637 kg)  BMI 25.90 kg/m2 Well developed, pleasant male in no distress Neck: no lymphadenopathy, thyromegaly or carotid bruit Heart: regular rate and rhythm without murmur Lungs: clear bilaterally Abdomen: soft, nontender, no organomegaly Extremities: no edema, 2+ pulse Skin: WHSS RUE, no rashes Psych: normal mood, affect, hygiene and  grooming  ASSESSMENT/PLAN: 1. Type II or unspecified type diabetes mellitus without mention of complication, not stated as uncontrolled  POCT HgB A1C, Glucose, random  2. Need for prophylactic vaccination and inoculation against influenza  Flu vaccine greater than or equal to 3yo preservative free IM  3. Need for Tdap vaccination  Tdap vaccine greater than or equal to 7yo IM  4. Pure hypercholesterolemia  Lipid panel  5. Encounter for long-term (current) use of other medications  Hepatic function panel  6. Microalbuminuria     resolved on ACEI   F/u in 6 months for CPE

## 2010-12-03 ENCOUNTER — Encounter: Payer: Self-pay | Admitting: Family Medicine

## 2010-12-11 ENCOUNTER — Encounter (INDEPENDENT_AMBULATORY_CARE_PROVIDER_SITE_OTHER): Payer: Self-pay | Admitting: Surgery

## 2010-12-11 ENCOUNTER — Ambulatory Visit (INDEPENDENT_AMBULATORY_CARE_PROVIDER_SITE_OTHER): Payer: Medicare Other | Admitting: Surgery

## 2010-12-11 VITALS — BP 128/64 | HR 52 | Temp 98.0°F | Resp 16 | Ht 72.0 in | Wt 196.8 lb

## 2010-12-11 DIAGNOSIS — C439 Malignant melanoma of skin, unspecified: Secondary | ICD-10-CM

## 2010-12-11 NOTE — Progress Notes (Signed)
Incisions have healed very well.  No complications.  To see Dr. Terri Piedra within next few weeks.

## 2010-12-11 NOTE — Patient Instructions (Signed)
Follow up with Dr Terri Piedra for ongoing screening for skin problems

## 2011-01-26 ENCOUNTER — Other Ambulatory Visit: Payer: Self-pay | Admitting: Family Medicine

## 2011-02-02 ENCOUNTER — Other Ambulatory Visit: Payer: Self-pay | Admitting: Family Medicine

## 2011-02-03 ENCOUNTER — Telehealth: Payer: Self-pay | Admitting: *Deleted

## 2011-02-03 NOTE — Telephone Encounter (Signed)
Refill ok'd.

## 2011-04-24 ENCOUNTER — Other Ambulatory Visit: Payer: Self-pay | Admitting: Family Medicine

## 2011-04-24 DIAGNOSIS — E7800 Pure hypercholesterolemia, unspecified: Secondary | ICD-10-CM

## 2011-04-25 NOTE — Telephone Encounter (Signed)
done

## 2011-04-25 NOTE — Telephone Encounter (Signed)
Is this ok?

## 2011-05-22 ENCOUNTER — Other Ambulatory Visit: Payer: Self-pay | Admitting: Family Medicine

## 2011-05-30 ENCOUNTER — Encounter: Payer: Self-pay | Admitting: Internal Medicine

## 2011-06-04 ENCOUNTER — Encounter: Payer: Self-pay | Admitting: Family Medicine

## 2011-06-04 ENCOUNTER — Ambulatory Visit (INDEPENDENT_AMBULATORY_CARE_PROVIDER_SITE_OTHER): Payer: Medicare Other | Admitting: Family Medicine

## 2011-06-04 VITALS — BP 124/64 | HR 60 | Ht 70.25 in | Wt 198.0 lb

## 2011-06-04 DIAGNOSIS — Z0000 Encounter for general adult medical examination without abnormal findings: Secondary | ICD-10-CM

## 2011-06-04 DIAGNOSIS — Z23 Encounter for immunization: Secondary | ICD-10-CM

## 2011-06-04 DIAGNOSIS — Z79899 Other long term (current) drug therapy: Secondary | ICD-10-CM

## 2011-06-04 DIAGNOSIS — E7800 Pure hypercholesterolemia, unspecified: Secondary | ICD-10-CM

## 2011-06-04 DIAGNOSIS — Z125 Encounter for screening for malignant neoplasm of prostate: Secondary | ICD-10-CM

## 2011-06-04 DIAGNOSIS — E119 Type 2 diabetes mellitus without complications: Secondary | ICD-10-CM

## 2011-06-04 LAB — POCT URINALYSIS DIPSTICK
Bilirubin, UA: NEGATIVE
Glucose, UA: NEGATIVE
Ketones, UA: NEGATIVE
Leukocytes, UA: NEGATIVE
Nitrite, UA: NEGATIVE

## 2011-06-04 LAB — COMPREHENSIVE METABOLIC PANEL
Alkaline Phosphatase: 87 U/L (ref 39–117)
BUN: 29 mg/dL — ABNORMAL HIGH (ref 6–23)
CO2: 24 mEq/L (ref 19–32)
Creat: 0.97 mg/dL (ref 0.50–1.35)
Glucose, Bld: 120 mg/dL — ABNORMAL HIGH (ref 70–99)
Total Bilirubin: 0.6 mg/dL (ref 0.3–1.2)

## 2011-06-04 LAB — CBC WITH DIFFERENTIAL/PLATELET
Basophils Relative: 0 % (ref 0–1)
Eosinophils Absolute: 0.2 10*3/uL (ref 0.0–0.7)
Eosinophils Relative: 2 % (ref 0–5)
Lymphs Abs: 1.6 10*3/uL (ref 0.7–4.0)
MCH: 31.6 pg (ref 26.0–34.0)
MCHC: 33.3 g/dL (ref 30.0–36.0)
MCV: 95 fL (ref 78.0–100.0)
Platelets: 251 10*3/uL (ref 150–400)
RBC: 4.43 MIL/uL (ref 4.22–5.81)
RDW: 13 % (ref 11.5–15.5)

## 2011-06-04 LAB — TSH: TSH: 3.625 u[IU]/mL (ref 0.350–4.500)

## 2011-06-04 LAB — HEMOGLOBIN A1C
Hgb A1c MFr Bld: 6 % — ABNORMAL HIGH (ref <5.7)
Mean Plasma Glucose: 126 mg/dL — ABNORMAL HIGH (ref <117)

## 2011-06-04 LAB — LIPID PANEL
HDL: 45 mg/dL (ref >39)
Total CHOL/HDL Ratio: 3 Ratio
Triglycerides: 85 mg/dL (ref <150)

## 2011-06-04 NOTE — Progress Notes (Signed)
Andre Walker is a 78 y.o. male who presents for a complete physical.  He has the following concerns:  Diabetes follow-up:  Blood sugars at home are running 113-140.  Denies hypoglycemia.  Denies polydipsia and polyuria.  Last eye exam was 02/2011.  Patient follows a low sugar diet and checks feet regularly without concerns.  Hyperlipidemia follow-up:  Patient is reportedly following a low-fat, low cholesterol diet.  Compliant with medications and denies medication side effects.  Health Maintenance: Immunization History  Administered Date(s) Administered  . Influenza Split 12/02/2010  . Pneumococcal Polysaccharide 12/25/2004  . Td 04/24/2004  . Tdap 12/02/2010  . Zoster 10/23/2007   Last colonoscopy: 10/2010 had EGD and colonoscopy Last PSA: 05/2010 Dentist: twice yearly, has appt scheduled for Monday Ophtho: 02/2011 Exercise: [employer] 3x/week and walks daily, golfs 1x/week  Past Medical History  Diagnosis Date  . Diabetes mellitus 2004  . BPH (benign prostatic hyperplasia)     controlled on Hytrin  . ED (erectile dysfunction)   . Colon polyps     Dr. Ewing Schlein  . Microalbuminuria     on ACEI for microalbuminuria (does NOT have HTN)  . Pure hypercholesterolemia 2006  . Actinic keratosis     Dr. Terri Piedra  . Traumatic partial tear of biceps tendon 01/2009    "popeye" injury  . Hearing loss     high frequency  . Hiatal hernia     small, noted on EGD 10/2010    Past Surgical History  Procedure Date  . Total knee arthroplasty 2012    right  . Appendectomy 1955  . Inguinal hernia repair 1989    right  . Total knee arthroplasty 2005    left  . Colonoscopy 11/09, 10/29/10    Dr. Ewing Schlein  . Esophagogastroduodenoscopy 10/29/10    Dr. Ewing Schlein  . Melanoma excision 10/08/10    R upper arm, Dr. Daphine Deutscher    History   Social History  . Marital Status: Married    Spouse Name: N/A    Number of Children: 2  . Years of Education: N/A   Occupational History  . Retired (from post office)     Social History Main Topics  . Smoking status: Never Smoker   . Smokeless tobacco: Never Used  . Alcohol Use: 0.0 oz/week    2-3 Glasses of wine per week     1 glass of wine 1-2 times per month.  . Drug Use: No  . Sexually Active: Not on file   Other Topics Concern  . Not on file   Social History Narrative   1 son in [place], 1 son in [place].  Still does some handyman work    Family History  Problem Relation Age of Onset  . Diabetes Sister   . Diabetes Brother   . Heart disease Brother   . Hyperlipidemia Brother   . Hypertension Brother   . Stroke Brother   . Diabetes Sister   . Cancer Paternal Uncle    Current Outpatient Prescriptions on File Prior to Visit  Medication Sig Dispense Refill  . aspirin 81 MG tablet Take 81 mg by mouth daily.        . Calcium Carbonate-Vitamin D (CALCIUM 600+D HIGH POTENCY) 600-400 MG-UNIT per tablet Take 2 tablets by mouth daily.        . Coenzyme Q10 (COQ10) 100 MG CAPS Take 1 capsule by mouth daily.        . fish oil-omega-3 fatty acids 1000 MG capsule Take 2 g  by mouth daily.        Marland Kitchen glucosamine-chondroitin 500-400 MG tablet Take 2 tablets by mouth daily.        Marland Kitchen lisinopril (PRINIVIL,ZESTRIL) 5 MG tablet Take 1 tablet (5 mg total) by mouth daily.  90 tablet  0  . metFORMIN (GLUCOPHAGE-XR) 500 MG 24 hr tablet TAKE ONE TABLET BY MOUTH TWO TIMES DAILY.  60 tablet  5  . Multiple Vitamins-Minerals (MULTIVITAMIN WITH MINERALS) tablet Take 1 tablet by mouth daily.        . sildenafil (VIAGRA) 100 MG tablet Take 100 mg by mouth daily as needed.        . simvastatin (ZOCOR) 20 MG tablet TAKE ONE TABLET BY MOUTH AT BEDTIME  90 tablet  0  . terazosin (HYTRIN) 5 MG capsule TAKE ONE CAPSULE BY MOUTH AT BEDTIME  90 capsule  3    Allergies  Allergen Reactions  . Penicillins Rash   ROS:  The patient denies anorexia, fever, weight changes, headaches,  vision loss, decreased hearing, ear pain, hoarseness, chest pain, palpitations, dizziness,  syncope, dyspnea on exertion, cough, swelling, nausea, vomiting, diarrhea, constipation, abdominal pain, melena, hematochezia, indigestion/heartburn, hematuria, incontinence, nocturia, weakened urine stream, dysuria, genital lesions, joint pains, numbness, tingling, weakness, tremor, suspicious skin lesions (sees derm and is getting AK's treated), depression, anxiety, abnormal bleeding/bruising, or enlarged lymph nodes  PHYSICAL EXAM: BP 124/64  Pulse 60  Ht 5' 10.25" (1.784 m)  Wt 198 lb (89.812 kg)  BMI 28.21 kg/m2  General Appearance:    Alert, cooperative, no distress, appears stated age  Head:    Normocephalic, without obvious abnormality, atraumatic  Eyes:    PERRL, conjunctiva/corneas clear, EOM's intact, fundi    benign  Ears:    Normal TM's and external ear canals  Nose:   Nares normal, mucosa normal, no drainage or sinus   tenderness  Throat:   Lips, mucosa, and tongue normal; teeth and gums normal  Neck:   Supple, no lymphadenopathy;  thyroid:  no   enlargement/tenderness/nodules; no carotid   bruit or JVD  Back:    Spine nontender, no curvature, ROM normal, no CVA     tenderness  Lungs:     Clear to auscultation bilaterally without wheezes, rales or     ronchi; respirations unlabored  Chest Wall:    No tenderness or deformity   Heart:    Regular rate and rhythm, S1 and S2 normal, no murmur, rub   or gallop  Breast Exam:    No chest wall tenderness, masses or gynecomastia  Abdomen:     Soft, non-tender, nondistended, normoactive bowel sounds,    no masses, no hepatosplenomegaly  Genitalia:    Normal male external genitalia without lesions.  Testicles without masses.  No inguinal hernias.  Rectal:    Normal sphincter tone, no masses or tenderness; guaiac negative stool.  Prostate smooth, no nodules, not enlarged.  Extremities:   No clubbing, cyanosis or edema  Pulses:   2+ and symmetric all extremities  Skin:   Skin color, texture, turgor normal, no rashes or lesions.  Dry  skin on feet, onychomycotic toenails.  Normal diabetic foot exam.  Lymph nodes:   Cervical, supraclavicular, and axillary nodes normal  Neurologic:   CNII-XII intact, normal strength, sensation and gait; reflexes 2+ and symmetric throughout          Psych:   Normal mood, affect, hygiene and grooming.      ASSESSMENT/PLAN:  1. Routine general medical examination at  a health care facility  POCT Urinalysis Dipstick, Visual acuity screening  2. Pure hypercholesterolemia  Lipid panel, Comprehensive metabolic panel  3. Type II or unspecified type diabetes mellitus without mention of complication, not stated as uncontrolled  Comprehensive metabolic panel, TSH, Microalbumin / creatinine urine ratio, Hemoglobin A1c  4. Special screening for malignant neoplasm of prostate  PSA, Medicare  5. Encounter for long-term (current) use of other medications  Comprehensive metabolic panel, CBC with Differential  6. Need for pneumococcal vaccination  Pneumococcal polysaccharide vaccine 23-valent greater than or equal to 2yo subcutaneous/IM   Discussed PSA screening (risks/benefits), recommended at least 30 minutes of aerobic activity at least 5 days/week; proper sunscreen use reviewed; healthy diet and alcohol recommendations (less than or equal to 2 drinks/day) reviewed; regular seatbelt use; changing batteries in smoke detectors. Self-testicular exams. Immunization recommendations discussed--pneumovax given today.  Colonoscopy recommendations reviewed--UTD  F/u 6 months, sooner prn

## 2011-06-04 NOTE — Patient Instructions (Signed)

## 2011-06-05 ENCOUNTER — Encounter: Payer: Self-pay | Admitting: Family Medicine

## 2011-06-05 LAB — MICROALBUMIN / CREATININE URINE RATIO
Creatinine, Urine: 123.9 mg/dL
Microalb Creat Ratio: 8.4 mg/g (ref 0.0–30.0)
Microalb, Ur: 1.04 mg/dL (ref 0.00–1.89)

## 2011-07-15 ENCOUNTER — Other Ambulatory Visit: Payer: Self-pay | Admitting: Dermatology

## 2011-07-28 ENCOUNTER — Other Ambulatory Visit: Payer: Self-pay | Admitting: Family Medicine

## 2011-07-31 ENCOUNTER — Other Ambulatory Visit: Payer: Self-pay | Admitting: Family Medicine

## 2011-09-29 ENCOUNTER — Other Ambulatory Visit: Payer: Self-pay | Admitting: Family Medicine

## 2011-10-06 ENCOUNTER — Other Ambulatory Visit: Payer: Self-pay | Admitting: Family Medicine

## 2011-12-03 ENCOUNTER — Ambulatory Visit (INDEPENDENT_AMBULATORY_CARE_PROVIDER_SITE_OTHER): Payer: Medicare Other | Admitting: Family Medicine

## 2011-12-03 ENCOUNTER — Encounter: Payer: Self-pay | Admitting: Family Medicine

## 2011-12-03 VITALS — BP 130/70 | HR 60 | Ht 71.0 in | Wt 198.0 lb

## 2011-12-03 DIAGNOSIS — Z79899 Other long term (current) drug therapy: Secondary | ICD-10-CM

## 2011-12-03 DIAGNOSIS — L570 Actinic keratosis: Secondary | ICD-10-CM | POA: Insufficient documentation

## 2011-12-03 DIAGNOSIS — E7800 Pure hypercholesterolemia, unspecified: Secondary | ICD-10-CM

## 2011-12-03 DIAGNOSIS — E119 Type 2 diabetes mellitus without complications: Secondary | ICD-10-CM

## 2011-12-03 DIAGNOSIS — Z23 Encounter for immunization: Secondary | ICD-10-CM

## 2011-12-03 LAB — LIPID PANEL
HDL: 39 mg/dL — ABNORMAL LOW (ref >39)
LDL Cholesterol: 71 mg/dL (ref 0–99)

## 2011-12-03 LAB — HEPATIC FUNCTION PANEL
AST: 19 U/L (ref 0–37)
Albumin: 4.3 g/dL (ref 3.5–5.2)
Alkaline Phosphatase: 83 U/L (ref 39–117)
Total Bilirubin: 0.6 mg/dL (ref 0.3–1.2)
Total Protein: 6.6 g/dL (ref 6.0–8.3)

## 2011-12-03 MED ORDER — SIMVASTATIN 20 MG PO TABS: 20.0000 mg | ORAL_TABLET | Freq: Every day | ORAL | Status: DC

## 2011-12-03 MED ORDER — METFORMIN HCL ER 500 MG PO TB24: 1000.0000 mg | ORAL_TABLET | Freq: Every day | ORAL | Status: DC

## 2011-12-03 MED ORDER — INFLUENZA VIRUS VACC SPLIT PF IM SUSP: 0.5000 mL | Freq: Once | INTRAMUSCULAR | Status: DC

## 2011-12-03 NOTE — Progress Notes (Signed)
Chief Complaint  Patient presents with  . Diabetes    fasting med check.   HPI:  Diabetes follow-up: Blood sugars at home are running 104-135, mostly 110-125. Denies hypoglycemia. Denies polydipsia and polyuria. Last eye exam was 02/2011. Patient follows a low sugar diet and checks feet regularly without concerns.   Hyperlipidemia follow-up: Patient is reportedly following a low-fat, low cholesterol diet. Compliant with medications and denies medication side effects.  [facility], playing golf at least once a week, and walks 20 minutes daily  Past Medical History  Diagnosis Date  . Diabetes mellitus 2004  . BPH (benign prostatic hyperplasia)     controlled on Hytrin  . ED (erectile dysfunction)   . Colon polyps     Dr. Ewing Schlein  . Microalbuminuria     on ACEI for microalbuminuria (does NOT have HTN)  . Pure hypercholesterolemia 2006  . Actinic keratosis     Dr. Terri Piedra  . Traumatic partial tear of biceps tendon 01/2009    "popeye" injury  . Hearing loss     high frequency  . Hiatal hernia     small, noted on EGD 10/2010   Past Surgical History  Procedure Date  . Total knee arthroplasty 2012    right  . Appendectomy 1955  . Inguinal hernia repair 1989    right  . Total knee arthroplasty 2005    left  . Colonoscopy 11/09, 10/29/10    Dr. Ewing Schlein  . Esophagogastroduodenoscopy 10/29/10    Dr. Ewing Schlein  . Melanoma excision 10/08/10    R upper arm, Dr. Daphine Deutscher   History   Social History  . Marital Status: Married    Spouse Name: N/A    Number of Children: 2  . Years of Education: N/A   Occupational History  . Retired (from post office)    Social History Main Topics  . Smoking status: Never Smoker   . Smokeless tobacco: Never Used  . Alcohol Use: 0.0 oz/week    2-3 Glasses of wine per week     1 glass of wine 1-2 times per month.  . Drug Use: No  . Sexually Active: Not on file   Other Topics Concern  . Not on file   Social History Narrative   1 son in  [place], 1 son in [place].  Still does some handyman work   Current Outpatient Prescriptions on File Prior to Visit  Medication Sig Dispense Refill  . aspirin 81 MG tablet Take 81 mg by mouth daily.        . Calcium Carbonate-Vitamin D (CALCIUM 600+D HIGH POTENCY) 600-400 MG-UNIT per tablet Take 2 tablets by mouth daily.        . Coenzyme Q10 (COQ10) 100 MG CAPS Take 1 capsule by mouth daily.        . fish oil-omega-3 fatty acids 1000 MG capsule Take 2 g by mouth daily.        Marland Kitchen glucosamine-chondroitin 500-400 MG tablet Take 2 tablets by mouth daily.        Marland Kitchen lisinopril (PRINIVIL,ZESTRIL) 5 MG tablet Take 1 tablet (5 mg total) by mouth daily.  90 tablet  0  . metFORMIN (GLUCOPHAGE-XR) 500 MG 24 hr tablet TAKE ONE TABLET BY MOUTH TWO TIMES DAILY.  60 tablet  1  . Multiple Vitamins-Minerals (MULTIVITAMIN WITH MINERALS) tablet Take 1 tablet by mouth daily.        . simvastatin (ZOCOR) 20 MG tablet TAKE ONE TABLET BY MOUTH AT  BEDTIME  90 tablet  1  . terazosin (HYTRIN) 5 MG capsule TAKE ONE CAPSULE BY MOUTH AT BEDTIME  90 capsule  3  . DISCONTD: lisinopril (PRINIVIL,ZESTRIL) 5 MG tablet TAKE ONE TABLET BY MOUTH EVERY DAY  90 tablet  1   No current facility-administered medications on file prior to visit.   Allergies  Allergen Reactions  . Penicillins Rash   ROS:  Denies fever, URI symptoms, cough, shortness of breath, chest pain, headaches, dizziness, nausea, vomiting, heartburn, bowel changes, dysuria. Up to void just once each night. Denies myalgias or significant joint pains  PHYSICAL EXAM: BP 130/70  Pulse 60  Ht 5\' 11"  (1.803 m)  Wt 198 lb (89.812 kg)  BMI 27.62 kg/m2  Well developed, pleasant male in no distress  Neck: no lymphadenopathy, thyromegaly or carotid bruit  Heart: regular rate and rhythm without murmur  Lungs: clear bilaterally  Abdomen: soft, nontender, no organomegaly  Extremities: no edema, 2+ pulse  Skin: WHSS RUE, no rashes.  Extensive actinic damage to  both forearms. Psych: normal mood, affect, hygiene and grooming Neuro: alert and oriented, normal gait, strength, sensation.  Lab Results  Component Value Date   HGBA1C 5.9 12/03/2011   ASSESSMENT/PLAN: 1. Type II or unspecified type diabetes mellitus without mention of complication, not stated as uncontrolled  HgB A1c, metFORMIN (GLUCOPHAGE-XR) 500 MG 24 hr tablet, Glucose, random  2. Need for prophylactic vaccination and inoculation against influenza  influenza  inactive virus vaccine (FLUZONE/FLUARIX) injection 0.5 mL  3. Pure hypercholesterolemia  simvastatin (ZOCOR) 20 MG tablet, Hepatic function panel, Lipid panel  4. Encounter for long-term (current) use of other medications  Hepatic function panel  5. Actinic keratoses     AK's--gets treated every winter through Dr. Terri Piedra, has appt scheduled for next month. Lipid, glu, lft's today.  F/u April for CPE (med check plus with [facility])--will need yearly labs then (PSA, TSH, urine microalb, as well as CBC, in addition to c-met and lipids and A1c--wants to do at visit rather than prior).

## 2011-12-03 NOTE — Patient Instructions (Signed)
Continue your current medications and daily exercise. Follow up with Dr. Terri Piedra as scheduled for treatments of your precancerous skin lesions.  Return in April for a physical

## 2011-12-04 ENCOUNTER — Encounter: Payer: Self-pay | Admitting: Family Medicine

## 2012-01-13 ENCOUNTER — Other Ambulatory Visit: Payer: Self-pay | Admitting: Dermatology

## 2012-01-20 ENCOUNTER — Encounter (INDEPENDENT_AMBULATORY_CARE_PROVIDER_SITE_OTHER): Payer: Medicare Other | Admitting: Surgery

## 2012-03-05 ENCOUNTER — Encounter (INDEPENDENT_AMBULATORY_CARE_PROVIDER_SITE_OTHER): Payer: Self-pay | Admitting: Surgery

## 2012-03-05 ENCOUNTER — Ambulatory Visit (INDEPENDENT_AMBULATORY_CARE_PROVIDER_SITE_OTHER): Payer: Medicare Other | Admitting: Surgery

## 2012-03-05 VITALS — BP 132/66 | HR 64 | Temp 97.6°F | Resp 16 | Ht 72.0 in | Wt 203.2 lb

## 2012-03-05 DIAGNOSIS — C4360 Malignant melanoma of unspecified upper limb, including shoulder: Secondary | ICD-10-CM

## 2012-03-05 NOTE — Progress Notes (Signed)
Chief Complaint:  Recurrent or new melanoma Clark's level 4 lentiginous in right arm.  History of Present Illness:  Andre Walker is an 79 y.o. male who had a melanoma excised from the right arm with sentinel node biopsy in August 2012.  Recurrent nodule located near prior biosy site was biopsied by Dr. Terri Piedra with diagnosis showing  : Lentiginous. Clark's Level: IV Breslow's measurement (millimeters): At least 2.35 mm. Host response: Focally brisk. Mitoses: Greater than 1 per millimeter square. Regression: Not seen. Ulceration: Not seen. Satellitosis: Not seen. Vascular Invasion: Not seen. Margin: Laterally, close to deep edge.  Past Medical History  Diagnosis Date  . Diabetes mellitus 2004  . BPH (benign prostatic hyperplasia)     controlled on Hytrin  . ED (erectile dysfunction)   . Colon polyps     Dr. Ewing Schlein  . Microalbuminuria     on ACEI for microalbuminuria (does NOT have HTN)  . Pure hypercholesterolemia 2006  . Actinic keratosis     Dr. Terri Piedra  . Traumatic partial tear of biceps tendon 01/2009    "popeye" injury  . Hearing loss     high frequency  . Hiatal hernia     small, noted on EGD 10/2010    Past Surgical History  Procedure Date  . Total knee arthroplasty 2012    right  . Appendectomy 1955  . Inguinal hernia repair 1989    right  . Total knee arthroplasty 2005    left  . Colonoscopy 11/09, 10/29/10    Dr. Ewing Schlein  . Esophagogastroduodenoscopy 10/29/10    Dr. Ewing Schlein  . Melanoma excision 10/08/10    R upper arm, Dr. Daphine Deutscher    Current Outpatient Prescriptions  Medication Sig Dispense Refill  . aspirin 81 MG tablet Take 81 mg by mouth daily.        . Calcium Carbonate-Vitamin D (CALCIUM 600+D HIGH POTENCY) 600-400 MG-UNIT per tablet Take 2 tablets by mouth daily.        . Coenzyme Q10 (COQ10) 100 MG CAPS Take 1 capsule by mouth daily.        . fish oil-omega-3 fatty acids 1000 MG capsule Take 2 g by mouth daily.        Marland Kitchen glucosamine-chondroitin 500-400 MG  tablet Take 2 tablets by mouth daily.        Marland Kitchen lisinopril (PRINIVIL,ZESTRIL) 5 MG tablet Take 1 tablet (5 mg total) by mouth daily.  90 tablet  0  . metFORMIN (GLUCOPHAGE-XR) 500 MG 24 hr tablet Take 2 tablets (1,000 mg total) by mouth daily with breakfast.  60 tablet  5  . Multiple Vitamins-Minerals (MULTIVITAMIN WITH MINERALS) tablet Take 1 tablet by mouth daily.        . simvastatin (ZOCOR) 20 MG tablet Take 1 tablet (20 mg total) by mouth at bedtime.  90 tablet  1  . terazosin (HYTRIN) 5 MG capsule TAKE ONE CAPSULE BY MOUTH AT BEDTIME  90 capsule  3   Penicillins Family History  Problem Relation Age of Onset  . Diabetes Sister   . Diabetes Brother   . Heart disease Brother   . Hyperlipidemia Brother   . Hypertension Brother   . Stroke Brother   . Diabetes Sister   . Cancer Paternal Uncle    Social History:   reports that he has never smoked. He has never used smokeless tobacco. He reports that he drinks alcohol. He reports that he does not use illicit drugs.   REVIEW OF SYSTEMS - PERTINENT  POSITIVES ONLY: Non contributory  Physical Exam:   Blood pressure 132/66, pulse 64, temperature 97.6 F (36.4 C), temperature source Temporal, resp. rate 16, height 6' (1.829 m), weight 203 lb 3.2 oz (92.171 kg). Body mass index is 27.56 kg/(m^2).  Gen:  WDWN WM NAD  Neurological: Alert and oriented to person, place, and time. Motor and sensory function is grossly intact  Head: Normocephalic and atraumatic.  Eyes: Conjunctivae are normal. Pupils are equal, round, and reactive to light. No scleral icterus.  Neck: Normal range of motion. Neck supple. No tracheal deviation or thyromegaly present.  Cardiovascular:  SR without murmurs or gallops.  No carotid bruits Respiratory: Effort normal.  No respiratory distress. No chest wall tenderness. Breath sounds normal.  No wheezes, rales or rhonchi.  Abdomen:  nontender GU: Musculoskeletal: Normal range of motion. Extremities are nontender. No  cyanosis, edema or clubbing noted Lymphadenopathy: No cervical, preauricular, postauricular or axillary adenopathy is present Skin: Skin is warm and dry. No rash noted. No diaphoresis. No erythema. No pallor. Many solar keratosis.  Melanoma site is in prior excision site in the right arm.  Plan reexcision and hopeful primary closure.   Pscyh: Normal mood and affect. Behavior is normal. Judgment and thought content normal.   LABORATORY RESULTS: No results found for this or any previous visit (from the past 48 hour(s)).  RADIOLOGY RESULTS: No results found.  Problem List: Patient Active Problem List  Diagnosis  . Pure hypercholesterolemia  . Type II or unspecified type diabetes mellitus without mention of complication, not stated as uncontrolled  . Actinic keratoses    Assessment & Plan: Melanoma of right arm.  Plan excision of right arm melanoma with possible skin graft.      Matt B. Daphine Deutscher, MD, [facility] [phone] beeper [phone]  03/05/2012 3:29 PM

## 2012-03-10 ENCOUNTER — Encounter (HOSPITAL_BASED_OUTPATIENT_CLINIC_OR_DEPARTMENT_OTHER): Payer: Self-pay | Admitting: *Deleted

## 2012-03-10 NOTE — Progress Notes (Signed)
Pt was here 8/12 for melanoma and lymph node bx To come in for bmet-ekg No cardiac or resp problems

## 2012-03-11 ENCOUNTER — Encounter (HOSPITAL_BASED_OUTPATIENT_CLINIC_OR_DEPARTMENT_OTHER)
Admission: RE | Admit: 2012-03-11 | Discharge: 2012-03-11 | Disposition: A | Discharge status: Home / Self Care | Payer: Medicare Other | Source: Ambulatory Visit | Attending: Surgery | Admitting: Surgery

## 2012-03-11 ENCOUNTER — Other Ambulatory Visit: Payer: Self-pay

## 2012-03-11 LAB — BASIC METABOLIC PANEL
Chloride: 103 mEq/L (ref 96–112)
GFR calc Af Amer: 77 mL/min — ABNORMAL LOW (ref >90)
GFR calc non Af Amer: 66 mL/min — ABNORMAL LOW (ref >90)
Glucose, Bld: 153 mg/dL — ABNORMAL HIGH (ref 70–99)
Potassium: 4.4 mEq/L (ref 3.5–5.1)
Sodium: 138 mEq/L (ref 135–145)

## 2012-03-16 ENCOUNTER — Ambulatory Visit (HOSPITAL_BASED_OUTPATIENT_CLINIC_OR_DEPARTMENT_OTHER): Payer: Medicare Other | Admitting: Anesthesiology

## 2012-03-16 ENCOUNTER — Encounter (HOSPITAL_BASED_OUTPATIENT_CLINIC_OR_DEPARTMENT_OTHER)
Admission: RE | Disposition: A | Discharge status: Home / Self Care | Payer: Self-pay | Source: Ambulatory Visit | Attending: Surgery

## 2012-03-16 ENCOUNTER — Encounter (HOSPITAL_BASED_OUTPATIENT_CLINIC_OR_DEPARTMENT_OTHER): Payer: Self-pay | Admitting: Anesthesiology

## 2012-03-16 ENCOUNTER — Ambulatory Visit (HOSPITAL_BASED_OUTPATIENT_CLINIC_OR_DEPARTMENT_OTHER)
Admission: RE | Admit: 2012-03-16 | Discharge: 2012-03-16 | Disposition: A | Discharge status: Home / Self Care | Payer: Medicare Other | Source: Ambulatory Visit | Attending: Surgery | Admitting: Surgery

## 2012-03-16 DIAGNOSIS — E119 Type 2 diabetes mellitus without complications: Secondary | ICD-10-CM | POA: Insufficient documentation

## 2012-03-16 DIAGNOSIS — C4360 Malignant melanoma of unspecified upper limb, including shoulder: Secondary | ICD-10-CM | POA: Insufficient documentation

## 2012-03-16 DIAGNOSIS — Z01810 Encounter for preprocedural cardiovascular examination: Secondary | ICD-10-CM | POA: Insufficient documentation

## 2012-03-16 DIAGNOSIS — Z7982 Long term (current) use of aspirin: Secondary | ICD-10-CM | POA: Insufficient documentation

## 2012-03-16 DIAGNOSIS — K449 Diaphragmatic hernia without obstruction or gangrene: Secondary | ICD-10-CM | POA: Insufficient documentation

## 2012-03-16 DIAGNOSIS — Z96659 Presence of unspecified artificial knee joint: Secondary | ICD-10-CM | POA: Insufficient documentation

## 2012-03-16 DIAGNOSIS — E7800 Pure hypercholesterolemia, unspecified: Secondary | ICD-10-CM | POA: Insufficient documentation

## 2012-03-16 DIAGNOSIS — Z01812 Encounter for preprocedural laboratory examination: Secondary | ICD-10-CM | POA: Insufficient documentation

## 2012-03-16 DIAGNOSIS — Z79899 Other long term (current) drug therapy: Secondary | ICD-10-CM | POA: Insufficient documentation

## 2012-03-16 DIAGNOSIS — I10 Essential (primary) hypertension: Secondary | ICD-10-CM | POA: Insufficient documentation

## 2012-03-16 HISTORY — DX: Presence of dental prosthetic device (complete) (partial): Z97.2

## 2012-03-16 HISTORY — DX: Presence of spectacles and contact lenses: Z97.3

## 2012-03-16 HISTORY — PX: MELANOMA EXCISION: SHX5266

## 2012-03-16 LAB — POCT HEMOGLOBIN-HEMACUE: Hemoglobin: 15.1 g/dL (ref 13.0–17.0)

## 2012-03-16 SURGERY — EXCISION, MELANOMA
Anesthesia: General | Site: Arm Upper | Laterality: Right | Wound class: Clean

## 2012-03-16 MED ORDER — SODIUM CHLORIDE 0.9 % IV SOLN: 250.0000 mL | INTRAVENOUS | Status: DC | PRN

## 2012-03-16 MED ORDER — CHLORHEXIDINE GLUCONATE 4 % EX LIQD: 1.0000 "application " | Freq: Once | CUTANEOUS | Status: DC

## 2012-03-16 MED ORDER — ACETAMINOPHEN 325 MG PO TABS: 650.0000 mg | ORAL_TABLET | ORAL | Status: DC | PRN

## 2012-03-16 MED ORDER — LACTATED RINGERS IV SOLN
INTRAVENOUS | Status: DC
  Administered 2012-03-16 (×2): via INTRAVENOUS

## 2012-03-16 MED ORDER — EPHEDRINE SULFATE 50 MG/ML IJ SOLN
INTRAMUSCULAR | Status: DC | PRN
  Administered 2012-03-16 (×3): 10 mg via INTRAVENOUS

## 2012-03-16 MED ORDER — ONDANSETRON HCL 4 MG/2ML IJ SOLN: 4.0000 mg | Freq: Four times a day (QID) | INTRAMUSCULAR | Status: DC | PRN

## 2012-03-16 MED ORDER — LIDOCAINE HCL (CARDIAC) 20 MG/ML IV SOLN
INTRAVENOUS | Status: DC | PRN
  Administered 2012-03-16: 60 mg via INTRAVENOUS

## 2012-03-16 MED ORDER — SODIUM CHLORIDE 0.9 % IJ SOLN: 3.0000 mL | Freq: Two times a day (BID) | INTRAMUSCULAR | Status: DC

## 2012-03-16 MED ORDER — PROPOFOL 10 MG/ML IV BOLUS
INTRAVENOUS | Status: DC | PRN
  Administered 2012-03-16: 200 mg via INTRAVENOUS

## 2012-03-16 MED ORDER — OXYCODONE HCL 5 MG PO TABS: 5.0000 mg | ORAL_TABLET | Freq: Once | ORAL | Status: DC | PRN

## 2012-03-16 MED ORDER — HYDROMORPHONE HCL PF 1 MG/ML IJ SOLN: 0.2500 mg | INTRAMUSCULAR | Status: DC | PRN

## 2012-03-16 MED ORDER — ONDANSETRON HCL 4 MG/2ML IJ SOLN: 4.0000 mg | Freq: Once | INTRAMUSCULAR | Status: DC | PRN

## 2012-03-16 MED ORDER — OXYCODONE HCL 5 MG/5ML PO SOLN: 5.0000 mg | Freq: Once | ORAL | Status: DC | PRN

## 2012-03-16 MED ORDER — BUPIVACAINE HCL (PF) 0.5 % IJ SOLN
INTRAMUSCULAR | Status: DC | PRN
  Administered 2012-03-16: 10 mL

## 2012-03-16 MED ORDER — ONDANSETRON HCL 4 MG/2ML IJ SOLN
INTRAMUSCULAR | Status: DC | PRN
  Administered 2012-03-16: 4 mg via INTRAVENOUS

## 2012-03-16 MED ORDER — MIDAZOLAM HCL 5 MG/5ML IJ SOLN
INTRAMUSCULAR | Status: DC | PRN
  Administered 2012-03-16: 1 mg via INTRAVENOUS

## 2012-03-16 MED ORDER — ACETAMINOPHEN 650 MG RE SUPP: 650.0000 mg | RECTAL | Status: DC | PRN

## 2012-03-16 MED ORDER — SODIUM CHLORIDE 0.9 % IJ SOLN: 3.0000 mL | INTRAMUSCULAR | Status: DC | PRN

## 2012-03-16 MED ORDER — HEPARIN SODIUM (PORCINE) 5000 UNIT/ML IJ SOLN
5000.0000 [IU] | Freq: Once | INTRAMUSCULAR | Status: AC
  Administered 2012-03-16: 5000 [IU] via SUBCUTANEOUS

## 2012-03-16 MED ORDER — OXYCODONE HCL 5 MG PO TABS: 5.0000 mg | ORAL_TABLET | ORAL | Status: DC | PRN

## 2012-03-16 MED ORDER — DEXAMETHASONE SODIUM PHOSPHATE 4 MG/ML IJ SOLN
INTRAMUSCULAR | Status: DC | PRN
  Administered 2012-03-16: 10 mg via INTRAVENOUS

## 2012-03-16 MED ORDER — FENTANYL CITRATE 0.05 MG/ML IJ SOLN
INTRAMUSCULAR | Status: DC | PRN
  Administered 2012-03-16 (×3): 25 ug via INTRAVENOUS
  Administered 2012-03-16: 50 ug via INTRAVENOUS
  Administered 2012-03-16: 25 ug via INTRAVENOUS

## 2012-03-16 MED ORDER — OXYCODONE-ACETAMINOPHEN 5-325 MG PO TABS: 1.0000 | ORAL_TABLET | ORAL | Status: DC | PRN

## 2012-03-16 SURGICAL SUPPLY — 45 items
BANDAGE ELASTIC 4 VELCRO ST LF (GAUZE/BANDAGES/DRESSINGS) ×2 IMPLANT
BANDAGE GAUZE ELAST BULKY 4 IN (GAUZE/BANDAGES/DRESSINGS) ×2 IMPLANT
BENZOIN TINCTURE PRP APPL 2/3 (GAUZE/BANDAGES/DRESSINGS) IMPLANT
BLADE SURG 15 STRL LF DISP TIS (BLADE) ×1 IMPLANT
BLADE SURG 15 STRL SS (BLADE) ×1
BLADE SURG ROTATE 9660 (MISCELLANEOUS) IMPLANT
CANISTER SUCTION 1200CC (MISCELLANEOUS) IMPLANT
CLEANER CAUTERY TIP 5X5 PAD (MISCELLANEOUS) ×1 IMPLANT
CLOTH BEACON ORANGE TIMEOUT ST (SAFETY) ×2 IMPLANT
COVER MAYO STAND STRL (DRAPES) ×2 IMPLANT
COVER TABLE BACK 60X90 (DRAPES) ×2 IMPLANT
DECANTER SPIKE VIAL GLASS SM (MISCELLANEOUS) IMPLANT
DRAPE PED LAPAROTOMY (DRAPES) ×2 IMPLANT
DRAPE U-SHAPE 76X120 STRL (DRAPES) IMPLANT
DRSG EMULSION OIL 3X3 NADH (GAUZE/BANDAGES/DRESSINGS) ×2 IMPLANT
ELECT REM PT RETURN 9FT ADLT (ELECTROSURGICAL) ×2
ELECTRODE REM PT RTRN 9FT ADLT (ELECTROSURGICAL) ×1 IMPLANT
GAUZE SPONGE 4X4 12PLY STRL LF (GAUZE/BANDAGES/DRESSINGS) IMPLANT
GLOVE BIO SURGEON STRL SZ8 (GLOVE) ×2 IMPLANT
GLOVE ECLIPSE 6.5 STRL STRAW (GLOVE) ×2 IMPLANT
GLOVE SKINSENSE NS SZ7.0 (GLOVE) ×1
GLOVE SKINSENSE STRL SZ7.0 (GLOVE) ×1 IMPLANT
GOWN PREVENTION PLUS XLARGE (GOWN DISPOSABLE) ×2 IMPLANT
GOWN PREVENTION PLUS XXLARGE (GOWN DISPOSABLE) ×2 IMPLANT
NEEDLE 27GAX1X1/2 (NEEDLE) IMPLANT
NEEDLE HYPO 25X1 1.5 SAFETY (NEEDLE) ×2 IMPLANT
NS IRRIG 1000ML POUR BTL (IV SOLUTION) IMPLANT
PACK BASIN DAY SURGERY FS (CUSTOM PROCEDURE TRAY) ×2 IMPLANT
PAD CLEANER CAUTERY TIP 5X5 (MISCELLANEOUS) ×1
PENCIL BUTTON HOLSTER BLD 10FT (ELECTRODE) ×2 IMPLANT
SLEEVE SCD COMPRESS KNEE MED (MISCELLANEOUS) ×2 IMPLANT
STRIP CLOSURE SKIN 1/2X4 (GAUZE/BANDAGES/DRESSINGS) IMPLANT
SUT ETHILON 5 0 PS 2 18 (SUTURE) ×2 IMPLANT
SUT PROLENE 4 0 PS 2 18 (SUTURE) ×2 IMPLANT
SUT VIC AB 4-0 SH 18 (SUTURE) ×2 IMPLANT
SUT VIC AB 5-0 PS2 18 (SUTURE) IMPLANT
SUT VICRYL 3-0 CR8 SH (SUTURE) ×2 IMPLANT
SYR BULB 3OZ (MISCELLANEOUS) ×2 IMPLANT
SYR CONTROL 10ML LL (SYRINGE) ×2 IMPLANT
TOWEL OR 17X24 6PK STRL BLUE (TOWEL DISPOSABLE) ×2 IMPLANT
TRAY DSU PREP LF (CUSTOM PROCEDURE TRAY) ×2 IMPLANT
TUBE CONNECTING 20X1/4 (TUBING) IMPLANT
UNDERPAD 30X30 INCONTINENT (UNDERPADS AND DIAPERS) ×2 IMPLANT
WATER STERILE IRR 1000ML POUR (IV SOLUTION) ×2 IMPLANT
YANKAUER SUCT BULB TIP NO VENT (SUCTIONS) IMPLANT

## 2012-03-16 NOTE — H&P (View-Only) (Signed)
Chief Complaint:  Recurrent or new melanoma Clark's level 4 lentiginous in right arm.  History of Present Illness:  Andre Walker is an 79 y.o. male who had a melanoma excised from the right arm with sentinel node biopsy in August 2012.  Recurrent nodule located near prior biosy site was biopsied by Dr. Lupton with diagnosis showing  : Lentiginous. Clark's Level: IV Breslow's measurement (millimeters): At least 2.35 mm. Host response: Focally brisk. Mitoses: Greater than 1 per millimeter square. Regression: Not seen. Ulceration: Not seen. Satellitosis: Not seen. Vascular Invasion: Not seen. Margin: Laterally, close to deep edge.  Past Medical History  Diagnosis Date  . Diabetes mellitus 2004  . BPH (benign prostatic hyperplasia)     controlled on Hytrin  . ED (erectile dysfunction)   . Colon polyps     Dr. Magod  . Microalbuminuria     on ACEI for microalbuminuria (does NOT have HTN)  . Pure hypercholesterolemia 2006  . Actinic keratosis     Dr. Lupton  . Traumatic partial tear of biceps tendon 01/2009    "popeye" injury  . Hearing loss     high frequency  . Hiatal hernia     small, noted on EGD 10/2010    Past Surgical History  Procedure Date  . Total knee arthroplasty 2012    right  . Appendectomy 1955  . Inguinal hernia repair 1989    right  . Total knee arthroplasty 2005    left  . Colonoscopy 11/09, 10/29/10    Dr. Magod  . Esophagogastroduodenoscopy 10/29/10    Dr. Magod  . Melanoma excision 10/08/10    R upper arm, Dr. Rhiannan Kievit    Current Outpatient Prescriptions  Medication Sig Dispense Refill  . aspirin 81 MG tablet Take 81 mg by mouth daily.        . Calcium Carbonate-Vitamin D (CALCIUM 600+D HIGH POTENCY) 600-400 MG-UNIT per tablet Take 2 tablets by mouth daily.        . Coenzyme Q10 (COQ10) 100 MG CAPS Take 1 capsule by mouth daily.        . fish oil-omega-3 fatty acids 1000 MG capsule Take 2 g by mouth daily.        . glucosamine-chondroitin 500-400 MG  tablet Take 2 tablets by mouth daily.        . lisinopril (PRINIVIL,ZESTRIL) 5 MG tablet Take 1 tablet (5 mg total) by mouth daily.  90 tablet  0  . metFORMIN (GLUCOPHAGE-XR) 500 MG 24 hr tablet Take 2 tablets (1,000 mg total) by mouth daily with breakfast.  60 tablet  5  . Multiple Vitamins-Minerals (MULTIVITAMIN WITH MINERALS) tablet Take 1 tablet by mouth daily.        . simvastatin (ZOCOR) 20 MG tablet Take 1 tablet (20 mg total) by mouth at bedtime.  90 tablet  1  . terazosin (HYTRIN) 5 MG capsule TAKE ONE CAPSULE BY MOUTH AT BEDTIME  90 capsule  3   Penicillins Family History  Problem Relation Age of Onset  . Diabetes Sister   . Diabetes Brother   . Heart disease Brother   . Hyperlipidemia Brother   . Hypertension Brother   . Stroke Brother   . Diabetes Sister   . Cancer Paternal Uncle    Social History:   reports that he has never smoked. He has never used smokeless tobacco. He reports that he drinks alcohol. He reports that he does not use illicit drugs.   REVIEW OF SYSTEMS - PERTINENT   POSITIVES ONLY: Non contributory  Physical Exam:   Blood pressure 132/66, pulse 64, temperature 97.6 F (36.4 C), temperature source Temporal, resp. rate 16, height 6' (1.829 m), weight 203 lb 3.2 oz (92.171 kg). Body mass index is 27.56 kg/(m^2).  Gen:  WDWN WM NAD  Neurological: Alert and oriented to person, place, and time. Motor and sensory function is grossly intact  Head: Normocephalic and atraumatic.  Eyes: Conjunctivae are normal. Pupils are equal, round, and reactive to light. No scleral icterus.  Neck: Normal range of motion. Neck supple. No tracheal deviation or thyromegaly present.  Cardiovascular:  SR without murmurs or gallops.  No carotid bruits Respiratory: Effort normal.  No respiratory distress. No chest wall tenderness. Breath sounds normal.  No wheezes, rales or rhonchi.  Abdomen:  nontender GU: Musculoskeletal: Normal range of motion. Extremities are nontender. No  cyanosis, edema or clubbing noted Lymphadenopathy: No cervical, preauricular, postauricular or axillary adenopathy is present Skin: Skin is warm and dry. No rash noted. No diaphoresis. No erythema. No pallor. Many solar keratosis.  Melanoma site is in prior excision site in the right arm.  Plan reexcision and hopeful primary closure.   Pscyh: Normal mood and affect. Behavior is normal. Judgment and thought content normal.   LABORATORY RESULTS: No results found for this or any previous visit (from the past 48 hour(s)).  RADIOLOGY RESULTS: No results found.  Problem List: Patient Active Problem List  Diagnosis  . Pure hypercholesterolemia  . Type II or unspecified type diabetes mellitus without mention of complication, not stated as uncontrolled  . Actinic keratoses    Assessment & Plan: Melanoma of right arm.  Plan excision of right arm melanoma with possible skin graft.      Matt B. Laekyn Rayos, MD, FACS  [facility] [phone] beeper [phone]  03/05/2012 3:29 PM     

## 2012-03-16 NOTE — Anesthesia Preprocedure Evaluation (Signed)
Anesthesia Evaluation  Patient identified by MRN, date of birth, ID band Patient awake    Reviewed: Allergy & Precautions, H&P , NPO status , Patient's Chart, lab work & pertinent test results  Airway Mallampati: I TM Distance: >3 FB Neck ROM: Full    Dental  (+) Teeth Intact, Partial Upper and Dental Advisory Given   Pulmonary  breath sounds clear to auscultation        Cardiovascular hypertension, Pt. on medications Rhythm:Regular Rate:Normal     Neuro/Psych    GI/Hepatic hiatal hernia,   Endo/Other  diabetes, Well Controlled, Type 2, Oral Hypoglycemic Agents  Renal/GU      Musculoskeletal   Abdominal   Peds  Hematology   Anesthesia Other Findings   Reproductive/Obstetrics                           Anesthesia Physical Anesthesia Plan  ASA: III  Anesthesia Plan: General   Post-op Pain Management:    Induction: Intravenous  Airway Management Planned: LMA  Additional Equipment:   Intra-op Plan:   Post-operative Plan: Extubation in OR  Informed Consent: I have reviewed the patients History and Physical, chart, labs and discussed the procedure including the risks, benefits and alternatives for the proposed anesthesia with the patient or authorized representative who has indicated his/her understanding and acceptance.   Dental advisory given  Plan Discussed with: CRNA, Anesthesiologist and Surgeon  Anesthesia Plan Comments:         Anesthesia Quick Evaluation

## 2012-03-16 NOTE — Interval H&P Note (Signed)
History and Physical Interval Note:  03/16/2012 1:13 PM  Andre Walker  has presented today for surgery, with the diagnosis of melanoma of right arm  The various methods of treatment have been discussed with the patient and family. After consideration of risks, benefits and other options for treatment, the patient has consented to  Procedure(s) (LRB) with comments: MELANOMA EXCISION (Right) - excision of melanoma of right arm as a surgical intervention .  The patient's history has been reviewed, patient examined, no change in status, stable for surgery.  I have reviewed the patient's chart and labs.  Questions were answered to the patient's satisfaction.     Nil Xiong B

## 2012-03-16 NOTE — Transfer of Care (Signed)
Immediate Anesthesia Transfer of Care Note  Patient: Andre Walker  Procedure(s) Performed: Procedure(s) (LRB) with comments: MELANOMA EXCISION (Right) - excision of melanoma of right arm  Patient Location: PACU  Anesthesia Type:General  Level of Consciousness: awake and alert   Airway & Oxygen Therapy: Patient Spontanous Breathing and Patient connected to face mask oxygen  Post-op Assessment: Report given to PACU RN and Post -op Vital signs reviewed and stable  Post vital signs: Reviewed and stable  Complications: No apparent anesthesia complications

## 2012-03-16 NOTE — Anesthesia Postprocedure Evaluation (Signed)
  Anesthesia Post-op Note  Patient: Andre Walker  Procedure(s) Performed: Procedure(s) (LRB) with comments: MELANOMA EXCISION (Right) - excision of melanoma of right arm  Patient Location: PACU  Anesthesia Type:General  Level of Consciousness: awake, alert  and oriented  Airway and Oxygen Therapy: Patient Spontanous Breathing  Post-op Pain: mild  Post-op Assessment: Post-op Vital signs reviewed  Post-op Vital Signs: Reviewed  Complications: No apparent anesthesia complications

## 2012-03-16 NOTE — Brief Op Note (Signed)
03/16/2012  2:48 PM  PATIENT:  Andre Walker  79 y.o. male  PRE-OPERATIVE DIAGNOSIS:  melanoma of right arm  POST-OPERATIVE DIAGNOSIS:  melanoma of right arm  PROCEDURE:  Procedure(s) (LRB) with comments: MELANOMA EXCISION (Right) - excision of melanoma of right arm  SURGEON:  Surgeon(s) and Role:    * Valarie Merino, MD - Primary  PHYSICIAN ASSISTANT:   ASSISTANTS: none   ANESTHESIA:   general  EBL:  Total I/O In: 1000 [I.V.:1000] Out: -   BLOOD ADMINISTERED:none  DRAINS: none   LOCAL MEDICATIONS USED:  Marcaine   SPECIMEN:  Source of Specimen:  right arm  DISPOSITION OF SPECIMEN:  PATHOLOGY  COUNTS:  YES  TOURNIQUET:  * No tourniquets in log *  DICTATION: .Other Dictation: Dictation Number [id number]  PLAN OF CARE: Discharge to home after PACU  PATIENT DISPOSITION:  PACU - hemodynamically stable.   Delay start of Pharmacological VTE agent (>24hrs) due to surgical blood loss or risk of bleeding: not applicable

## 2012-03-16 NOTE — Anesthesia Procedure Notes (Signed)
Procedure Name: LMA Insertion Date/Time: 03/16/2012 1:25 PM Performed by: Caren Macadam Pre-anesthesia Checklist: Patient identified, Emergency Drugs available, Suction available and Patient being monitored Patient Re-evaluated:Patient Re-evaluated prior to inductionOxygen Delivery Method: Circle System Utilized Preoxygenation: Pre-oxygenation with 100% oxygen Intubation Type: IV induction Ventilation: Mask ventilation without difficulty LMA: LMA inserted LMA Size: 5.0 Number of attempts: 1 Airway Equipment and Method: bite block Placement Confirmation: positive ETCO2 and breath sounds checked- equal and bilateral Tube secured with: Tape Dental Injury: Teeth and Oropharynx as per pre-operative assessment

## 2012-03-17 ENCOUNTER — Encounter (HOSPITAL_BASED_OUTPATIENT_CLINIC_OR_DEPARTMENT_OTHER): Payer: Self-pay | Admitting: Surgery

## 2012-03-17 NOTE — Op Note (Signed)
NAME:  Andre Walker, Andre Walker                 ACCOUNT NO.:  [id number]  MEDICAL RECORD NO.:  [id number]  LOCATION:                                 FACILITY:  PHYSICIAN:  Thornton Park. Daphine Deutscher, MD  DATE OF BIRTH:  17-Dec-1932  DATE OF PROCEDURE:  03/16/2012 DATE OF DISCHARGE:                              OPERATIVE REPORT   PREOPERATIVE DIAGNOSIS:  Recurrent melanoma of the right arm.  PROCEDURE:  Reexcision with primary closure, melanoma, right arm.  SURGEON:  Thornton Park. Daphine Deutscher, MD  ASSISTANT:  None.  ANESTHESIA:  General.  LOCATION:  [facility], room 5.  DESCRIPTION OF PROCEDURE:  [name] was taken to room 5 and given general anesthesia and placed on the OR table in a supine position with the right arm abducted.  This area was prepped widely with PCMX and draped sterilely.  Preoperatively I had marked the area and the recurrence was right in the center of the old incision line and he and I marked it preoperatively.  I kind of created a series of bull's eyes around it, but then allowed my final excision margin which I think is more along the lines of about 2 cm to be gauged by mobilization of his very elastic skin.  After that I placed an elliptical incision marker and then followed that guideline to create an ellipse.  I subsequently placed a nylon suture on the distal end of the ellipse.  This was a distal margin.  Once I transected the dermis into the fat, I went down to the fascia and then used combination of sharp dissection with the scalpel and also the electrocautery to completely remove this full-thickness area off the fascia.  There was no evidence posteriorly that it penetrated through the fat and nor I could see any satellite lesions. The specimen was removed and placed for permanent sections.  The resultant large defect was able to be approximated after some mobilization with interrupted 3-0 Vicryl subcutaneously, which brought it together nicely and then I completed the  closure with running 4-0 nylon on the skin.  Adaptic, Kerlix, and Ace wrap were then applied to this.  I think these sutures need to stay in a minimum of 10 days and hopefully will be out between 10 and 12 days.  We will see him back in the office at that time.  FINAL DIAGNOSIS:  Recurrent melanoma of the right arm status post re- excision.  Prior negative sentinel lymph node.  Permanent sections are pending.     Thornton Park Daphine Deutscher, MD     MBM/MEDQ  D:  03/16/2012  T:  03/17/2012  Job:  [id number]  cc:   Gelene Mink A. Worthy Rancher, M.D.

## 2012-03-23 ENCOUNTER — Encounter (INDEPENDENT_AMBULATORY_CARE_PROVIDER_SITE_OTHER): Payer: Self-pay

## 2012-03-26 ENCOUNTER — Ambulatory Visit (INDEPENDENT_AMBULATORY_CARE_PROVIDER_SITE_OTHER): Payer: Medicare Other

## 2012-03-26 ENCOUNTER — Encounter (INDEPENDENT_AMBULATORY_CARE_PROVIDER_SITE_OTHER): Payer: Self-pay

## 2012-03-26 VITALS — BP 122/60 | HR 72 | Resp 16 | Ht 72.0 in | Wt 203.6 lb

## 2012-03-26 DIAGNOSIS — Z4802 Encounter for removal of sutures: Secondary | ICD-10-CM

## 2012-04-04 ENCOUNTER — Other Ambulatory Visit: Payer: Self-pay | Admitting: Family Medicine

## 2012-04-08 ENCOUNTER — Encounter (INDEPENDENT_AMBULATORY_CARE_PROVIDER_SITE_OTHER): Payer: Medicare Other | Admitting: Surgery

## 2012-04-09 ENCOUNTER — Telehealth (INDEPENDENT_AMBULATORY_CARE_PROVIDER_SITE_OTHER): Payer: Self-pay

## 2012-04-09 NOTE — Telephone Encounter (Signed)
LMOM for pt letting him know that his new appt has been set for [facility], March 3rd @ 3p

## 2012-04-23 ENCOUNTER — Telehealth (INDEPENDENT_AMBULATORY_CARE_PROVIDER_SITE_OTHER): Payer: Self-pay

## 2012-04-23 NOTE — Telephone Encounter (Signed)
Called pt to ask if could r/s the time for his appt on 3/3 from 3p to 130p.  Pt stated that this works for him.  I will r/s

## 2012-04-26 ENCOUNTER — Ambulatory Visit (INDEPENDENT_AMBULATORY_CARE_PROVIDER_SITE_OTHER): Payer: Medicare Other | Admitting: Surgery

## 2012-04-26 ENCOUNTER — Encounter (INDEPENDENT_AMBULATORY_CARE_PROVIDER_SITE_OTHER): Payer: Self-pay | Admitting: Surgery

## 2012-04-26 ENCOUNTER — Encounter (INDEPENDENT_AMBULATORY_CARE_PROVIDER_SITE_OTHER): Payer: Medicare Other | Admitting: Surgery

## 2012-04-26 VITALS — BP 142/60 | HR 60 | Temp 97.3°F | Resp 16 | Ht 72.0 in | Wt 201.4 lb

## 2012-04-26 DIAGNOSIS — C4361 Malignant melanoma of right upper limb, including shoulder: Secondary | ICD-10-CM

## 2012-04-26 DIAGNOSIS — C4360 Malignant melanoma of unspecified upper limb, including shoulder: Secondary | ICD-10-CM

## 2012-04-26 NOTE — Progress Notes (Signed)
Andre Walker 79 y.o.  Body mass index is 27.31 kg/(m^2).  Patient Active Problem List  Diagnosis  . Pure hypercholesterolemia  . Type II or unspecified type diabetes mellitus without mention of complication, not stated as uncontrolled  . Actinic keratoses  . Melanoma of right arm    Allergies  Allergen Reactions  . Penicillins Rash    Past Surgical History  Procedure Laterality Date  . Total knee arthroplasty  2012    right  . Appendectomy  1955  . Inguinal hernia repair  1989    right  . Total knee arthroplasty  2005    left  . Colonoscopy  11/09, 10/29/10    Dr. Ewing Schlein  . Esophagogastroduodenoscopy  10/29/10    Dr. Ewing Schlein  . Melanoma excision  10/08/10    R upper arm, Dr. Daphine Deutscher  . Melanoma excision  03/16/2012    Procedure: MELANOMA EXCISION;  Surgeon: Valarie Merino, MD;  Location: [facility];  Service: General;  Laterality: Right;  excision of melanoma of right arm   KNAPP,EVE A, MD No diagnosis found.  Andre Walker had a little Vicryl not visible and I removed that.His path report showed no residual melanoma. His arm is healing fine looks good. I will be glad to see him again whenever needed. Impression status post removal of melanoma of arm. Doing well Andre B. Daphine Deutscher, MD, [facility] [phone] beeper [phone]  04/26/2012 1:50 PM

## 2012-04-26 NOTE — Patient Instructions (Signed)
Thanks for your patience.  If you need further assistance after leaving the office, please call our office and speak with a [facility] nurse.  [phone].  If you want to leave a message for Dr. Martin, please call his office phone at [phone]. 

## 2012-05-18 ENCOUNTER — Other Ambulatory Visit: Payer: Self-pay | Admitting: Family Medicine

## 2012-06-02 ENCOUNTER — Ambulatory Visit (INDEPENDENT_AMBULATORY_CARE_PROVIDER_SITE_OTHER): Payer: Medicare Other | Admitting: Family Medicine

## 2012-06-02 ENCOUNTER — Encounter: Payer: Self-pay | Admitting: Family Medicine

## 2012-06-02 VITALS — BP 100/64 | HR 60 | Ht 71.0 in | Wt 196.0 lb

## 2012-06-02 DIAGNOSIS — Z125 Encounter for screening for malignant neoplasm of prostate: Secondary | ICD-10-CM

## 2012-06-02 DIAGNOSIS — R809 Proteinuria, unspecified: Secondary | ICD-10-CM

## 2012-06-02 DIAGNOSIS — E7800 Pure hypercholesterolemia, unspecified: Secondary | ICD-10-CM

## 2012-06-02 DIAGNOSIS — N529 Male erectile dysfunction, unspecified: Secondary | ICD-10-CM

## 2012-06-02 DIAGNOSIS — Z0000 Encounter for general adult medical examination without abnormal findings: Secondary | ICD-10-CM

## 2012-06-02 DIAGNOSIS — E119 Type 2 diabetes mellitus without complications: Secondary | ICD-10-CM

## 2012-06-02 LAB — POCT GLYCOSYLATED HEMOGLOBIN (HGB A1C): Hemoglobin A1C: 6.2

## 2012-06-02 MED ORDER — LISINOPRIL 5 MG PO TABS: ORAL_TABLET | ORAL | Status: DC

## 2012-06-02 MED ORDER — SIMVASTATIN 20 MG PO TABS: 20.0000 mg | ORAL_TABLET | Freq: Every day | ORAL | Status: DC

## 2012-06-02 MED ORDER — SILDENAFIL CITRATE 100 MG PO TABS: 50.0000 mg | ORAL_TABLET | Freq: Every day | ORAL | Status: DC | PRN

## 2012-06-02 MED ORDER — METFORMIN HCL ER 500 MG PO TB24: 1000.0000 mg | ORAL_TABLET | Freq: Every day | ORAL | Status: DC

## 2012-06-02 NOTE — Progress Notes (Signed)
Chief Complaint  Patient presents with  . Med check plus    fasting med check plus. No major concerns today, states that he is here just for routine exam.   Andre Walker is a 79 y.o. male who presents for a med check (f/u DM, hyperlipidemia) and Annual Wellness Visit.  He has no specific complaints today.  Diabetes follow-up:  Blood sugars at home are running 100-136.  Denies hypoglycemia.  Denies polydipsia and polyuria.  Last eye exam was 02/2012.  Patient follows a low sugar diet and checks feet regularly without concerns.  Hyperlipidemia follow-up: Patient is reportedly following a low-fat, low cholesterol diet. Compliant with medications and denies medication side effects.  Annual Wellness Visit: See attached/scanned questionnaires--ADL and depression screens are negative Other doctors caring for patient: Dr. Daphine Deutscher (general surgeon, recent melanoma excision) Dr. Terri Piedra (dermatologist) Dr. Dione Booze (ophtho) Dr. Providence Lanius (dentist) Dr. Ewing Schlein (GI)  They have their house for sale, planning to downsize, moving to [facility] (independent living) End of life issues:  He has living will and healthcare power of attorney  Immunization History  Administered Date(s) Administered  . Influenza Split 12/02/2010, 12/03/2011  . Pneumococcal Polysaccharide 12/25/2004, 06/04/2011  . Td 04/24/2004  . Tdap 12/02/2010  . Zoster 10/23/2007   Last colonoscopy: 10/2010 Last PSA: Ophtho: 02/2012 Dentist: twice yearly Exercise: [employer] 3x/week, yardwork, and walks  Past Medical History  Diagnosis Date  . Diabetes mellitus 2004  . BPH (benign prostatic hyperplasia)     controlled on Hytrin  . ED (erectile dysfunction)   . Colon polyps     Dr. Ewing Schlein  . Microalbuminuria     on ACEI for microalbuminuria (does NOT have HTN)  . Pure hypercholesterolemia 2006  . Actinic keratosis     Dr. Terri Piedra  . Traumatic partial tear of biceps tendon 01/2009    "popeye" injury  . Hearing loss     high  frequency  . Hiatal hernia     small, noted on EGD 10/2010  . Wears glasses   . Wears partial dentures     lower    Past Surgical History  Procedure Laterality Date  . Total knee arthroplasty  2012    right  . Appendectomy  1955  . Inguinal hernia repair  1989    right  . Total knee arthroplasty  2005    left  . Colonoscopy  11/09, 10/29/10    Dr. Ewing Schlein  . Esophagogastroduodenoscopy  10/29/10    Dr. Ewing Schlein  . Melanoma excision  10/08/10    R upper arm, Dr. Daphine Deutscher  . Melanoma excision  03/16/2012    Procedure: MELANOMA EXCISION;  Surgeon: Valarie Merino, MD;  Location: [facility];  Service: General;  Laterality: Right;  excision of melanoma of right arm    History   Social History  . Marital Status: Married    Spouse Name: N/A    Number of Children: 2  . Years of Education: N/A   Occupational History  . Retired (from post office)    Social History Main Topics  . Smoking status: Never Smoker   . Smokeless tobacco: Never Used  . Alcohol Use: 0.0 oz/week    2-3 Glasses of wine per week     Comment: 1 glass of wine 1-2 times per month.  . Drug Use: No  . Sexually Active: Not on file   Other Topics Concern  . Not on file   Social History Narrative   1 son  in [place], 1 son in [place].  Still does some handyman work    Family History  Problem Relation Age of Onset  . Diabetes Sister   . Diabetes Brother   . Heart disease Brother   . Hyperlipidemia Brother   . Hypertension Brother   . Stroke Brother   . Diabetes Sister   . Cancer Paternal Uncle    Current Outpatient Prescriptions on File Prior to Visit  Medication Sig Dispense Refill  . aspirin 81 MG tablet Take 81 mg by mouth daily.        . Calcium Carbonate-Vitamin D (CALCIUM 600+D HIGH POTENCY) 600-400 MG-UNIT per tablet Take 2 tablets by mouth daily.        . Coenzyme Q10 (COQ10) 100 MG CAPS Take 1 capsule by mouth daily.        . fish oil-omega-3 fatty acids 1000 MG capsule Take 2 g  by mouth daily.        Marland Kitchen glucosamine-chondroitin 500-400 MG tablet Take 2 tablets by mouth daily.        . Multiple Vitamins-Minerals (MULTIVITAMIN WITH MINERALS) tablet Take 1 tablet by mouth daily.        Marland Kitchen terazosin (HYTRIN) 5 MG capsule TAKE ONE CAPSULE BY MOUTH AT BEDTIME  90 capsule  0   No current facility-administered medications on file prior to visit.   Allergies  Allergen Reactions  . Penicillins Rash   ROS: The patient denies anorexia, fever, weight changes, headaches, vision loss, ear pain, hoarseness, chest pain, palpitations, dizziness, syncope, dyspnea on exertion, cough, swelling, nausea, vomiting, diarrhea, constipation, abdominal pain, melena, hematochezia, indigestion/heartburn, hematuria, incontinence, nocturia, weakened urine stream, dysuria, genital lesions, joint pains, numbness, tingling, weakness, tremor, suspicious skin lesions (sees derm and is getting AK's treated; and recent recurrence of melanoma removed), depression, anxiety, abnormal bleeding/bruising, or enlarged lymph nodes. Some high frequency hearing loss, and "selective hearing" per wife.  No other hearing problems noted.  Denies memory concerns  PHYSICAL EXAM: BP 100/64  Pulse 60  Ht 5\' 11"  (1.803 m)  Wt 196 lb (88.905 kg)  BMI 27.35 kg/m2  General Appearance:  Alert, cooperative, no distress, appears stated age   Head:  Normocephalic, without obvious abnormality, atraumatic   Eyes:  PERRL, conjunctiva/corneas clear, EOM's intact, fundi  benign   Ears:  Normal TM's and external ear canals   Nose:  Nares normal, mucosa normal, no drainage or sinus tenderness   Throat:  Lips, mucosa, and tongue normal; teeth and gums normal   Neck:  Supple, no lymphadenopathy; thyroid: no enlargement/tenderness/nodules; no carotid  bruit or JVD   Back:  Spine nontender, no curvature, ROM normal, no CVA tenderness   Lungs:  Clear to auscultation bilaterally without wheezes, rales or ronchi; respirations unlabored    Chest Wall:  No tenderness or deformity   Heart:  Regular rate and rhythm, S1 and S2 normal, no murmur, rub  or gallop   Breast Exam:  No chest wall tenderness, masses or gynecomastia   Abdomen:  Soft, non-tender, nondistended, normoactive bowel sounds,  no masses, no hepatosplenomegaly   Genitalia:  Normal male external genitalia without lesions. Testicles without masses. No inguinal hernias.   Rectal:  Normal sphincter tone, no masses or tenderness; guaiac negative stool. Prostate smooth, no nodules, mildly enlarged on R lobe>L.  No nodules   Extremities:  No clubbing, cyanosis or edema   Pulses:  2+ and symmetric all extremities   Skin:  Skin color, texture, turgor normal, no rashes or  lesions. Dry skin on feet, onychomycotic toenails. Normal diabetic foot exam.   Lymph nodes:  Cervical, supraclavicular, and axillary nodes normal   Neurologic:  CNII-XII intact, normal strength, sensation and gait; reflexes 2+ and symmetric throughout           Psych: Normal mood, affect, hygiene and grooming.    Lab Results  Component Value Date   HGBA1C 6.2 06/02/2012    ASSESSMENT/PLAN:  Type II or unspecified type diabetes mellitus without mention of complication, not stated as uncontrolled - Plan: HgB A1c, TSH, Comprehensive metabolic panel, Microalbumin / creatinine urine ratio, metFORMIN (GLUCOPHAGE-XR) 500 MG 24 hr tablet  Pure hypercholesterolemia - Plan: Comprehensive metabolic panel, Lipid panel, simvastatin (ZOCOR) 20 MG tablet  Special screening for malignant neoplasm of prostate - Plan: PSA, Medicare  Erectile dysfunction - Plan: sildenafil (VIAGRA) 100 MG tablet  Microalbuminuria - Plan: lisinopril (PRINIVIL,ZESTRIL) 5 MG tablet  Discussed PSA screening (risks/benefits), recommended at least 30 minutes of aerobic activity at least 5 days/week; proper sunscreen use reviewed; healthy diet and alcohol recommendations (less than or equal to 2 drinks/day) reviewed; regular seatbelt use;  changing batteries in smoke detectors.  Immunization recommendations discussed, UTD, see below.  Colonoscopy recommendations reviewed, UTD.  Discussed Hepatitis B vaccine recommendation for those with DM. He does not share his meter, nor have other risk factors.  Declines today.  AWV:  MOST form filled out. See copy of scanned form re: covered benefits

## 2012-06-02 NOTE — Patient Instructions (Signed)

## 2012-06-03 ENCOUNTER — Encounter: Payer: Self-pay | Admitting: Family Medicine

## 2012-06-03 LAB — COMPREHENSIVE METABOLIC PANEL
ALT: 22 U/L (ref 0–53)
AST: 18 U/L (ref 0–37)
Albumin: 4.3 g/dL (ref 3.5–5.2)
Alkaline Phosphatase: 76 U/L (ref 39–117)
Calcium: 9.2 mg/dL (ref 8.4–10.5)
Chloride: 103 mEq/L (ref 96–112)
Potassium: 4.2 mEq/L (ref 3.5–5.3)
Sodium: 139 mEq/L (ref 135–145)
Total Protein: 6.7 g/dL (ref 6.0–8.3)

## 2012-06-03 LAB — LIPID PANEL
Cholesterol: 134 mg/dL (ref 0–200)
Total CHOL/HDL Ratio: 3.1 Ratio
Triglycerides: 84 mg/dL (ref <150)
VLDL: 17 mg/dL (ref 0–40)

## 2012-06-03 LAB — TSH: TSH: 3.554 u[IU]/mL (ref 0.350–4.500)

## 2012-06-03 LAB — PSA, MEDICARE: PSA: 1.96 ng/mL (ref <=4.00)

## 2012-06-29 ENCOUNTER — Telehealth: Payer: Self-pay | Admitting: Family Medicine

## 2012-06-29 NOTE — Telephone Encounter (Signed)
FORM WAS DROPPED OFF TO BE COMPLETED. PT AND SPOUSE ARE WANTING TO MOVE TO ASSISTED LIVING AND THIS FORM PERTAINS TO THAT. SENDING BACK ON PT'S CHART TO BE ADDRESSED.  °

## 2012-07-19 ENCOUNTER — Other Ambulatory Visit: Payer: Self-pay | Admitting: Family Medicine

## 2012-08-13 ENCOUNTER — Telehealth: Payer: Self-pay | Admitting: Family Medicine

## 2012-08-13 MED ORDER — TERAZOSIN HCL 5 MG PO CAPS: ORAL_CAPSULE | ORAL | Status: DC

## 2012-08-13 NOTE — Telephone Encounter (Signed)
Hytrin renewed

## 2012-10-30 ENCOUNTER — Other Ambulatory Visit: Payer: Self-pay | Admitting: Family Medicine

## 2012-12-01 ENCOUNTER — Ambulatory Visit (INDEPENDENT_AMBULATORY_CARE_PROVIDER_SITE_OTHER): Payer: Medicare Other | Admitting: Family Medicine

## 2012-12-01 ENCOUNTER — Encounter: Payer: Self-pay | Admitting: Family Medicine

## 2012-12-01 VITALS — BP 120/58 | HR 60 | Ht 71.0 in | Wt 188.0 lb

## 2012-12-01 DIAGNOSIS — E7800 Pure hypercholesterolemia, unspecified: Secondary | ICD-10-CM

## 2012-12-01 DIAGNOSIS — Z23 Encounter for immunization: Secondary | ICD-10-CM

## 2012-12-01 DIAGNOSIS — E119 Type 2 diabetes mellitus without complications: Secondary | ICD-10-CM

## 2012-12-01 LAB — HEPATIC FUNCTION PANEL
AST: 14 U/L (ref 0–37)
Alkaline Phosphatase: 73 U/L (ref 39–117)
Bilirubin, Direct: 0.2 mg/dL (ref 0.0–0.3)
Total Bilirubin: 0.7 mg/dL (ref 0.3–1.2)

## 2012-12-01 MED ORDER — METFORMIN HCL ER 500 MG PO TB24: ORAL_TABLET | ORAL | Status: DC

## 2012-12-01 MED ORDER — SIMVASTATIN 20 MG PO TABS: ORAL_TABLET | ORAL | Status: DC

## 2012-12-01 NOTE — Patient Instructions (Signed)
Continue your current medications. Continue to check your feet regularly. Follow up regularly with the dermatologist--you have more pre-cancerous lesions that need to be treated (arms and legs)

## 2012-12-01 NOTE — Progress Notes (Signed)
Chief Complaint  Patient presents with  . Hypertension    fasting med check.   He has no specific complaints or concerns today, other than needing medication refills.  Diabetes follow-up: Blood sugars at home are running 105-130. Denies hypoglycemia. Denies polydipsia and polyuria. Last eye exam was 02/2012, scheduled for 02/2013. Patient follows a low sugar diet and checks feet regularly without concerns.   Hyperlipidemia follow-up: Patient is reportedly following a low-fat, low cholesterol diet. Compliant with medications and denies medication side effects.   Going to the [employer] 3 times/week and playing golf.  He is settled into his new home at [facility], and found a nice area to walk.  Past Medical History  Diagnosis Date  . Diabetes mellitus 2004  . BPH (benign prostatic hyperplasia)     controlled on Hytrin  . ED (erectile dysfunction)   . Colon polyps     Dr. Ewing Schlein  . Microalbuminuria     on ACEI for microalbuminuria (does NOT have HTN)  . Pure hypercholesterolemia 2006  . Actinic keratosis     Dr. Terri Piedra  . Traumatic partial tear of biceps tendon 01/2009    "popeye" injury  . Hearing loss     high frequency  . Hiatal hernia     small, noted on EGD 10/2010  . Wears glasses   . Wears partial dentures     lower   Past Surgical History  Procedure Laterality Date  . Total knee arthroplasty  2012    right  . Appendectomy  1955  . Inguinal hernia repair  1989    right  . Total knee arthroplasty  2005    left  . Colonoscopy  11/09, 10/29/10    Dr. Ewing Schlein  . Esophagogastroduodenoscopy  10/29/10    Dr. Ewing Schlein  . Melanoma excision  10/08/10    R upper arm, Dr. Daphine Deutscher  . Melanoma excision  03/16/2012    Procedure: MELANOMA EXCISION;  Surgeon: Valarie Merino, MD;  Location: [facility];  Service: General;  Laterality: Right;  excision of melanoma of right arm   History   Social History  . Marital Status: Married    Spouse Name: N/A    Number of  Children: 2  . Years of Education: N/A   Occupational History  . Retired (from post office)    Social History Main Topics  . Smoking status: Never Smoker   . Smokeless tobacco: Never Used  . Alcohol Use: 0.0 oz/week    2-3 Glasses of wine per week     Comment: 1 glass of wine 1-2 times per month.  . Drug Use: No  . Sexual Activity: Not on file   Other Topics Concern  . Not on file   Social History Narrative   1 son in [place], 1 son in [place].  Still does some handyman work.  Moved to [facility] [place] 09/2012    Current outpatient prescriptions:aspirin 81 MG tablet, Take 81 mg by mouth daily.  , Disp: , Rfl: ;  Calcium Carbonate-Vitamin D (CALCIUM 600+D HIGH POTENCY) 600-400 MG-UNIT per tablet, Take 2 tablets by mouth daily.  , Disp: , Rfl: ;  Coenzyme Q10 (COQ10) 100 MG CAPS, Take 1 capsule by mouth daily.  , Disp: , Rfl: ;  glucosamine-chondroitin 500-400 MG tablet, Take 2 tablets by mouth daily.  , Disp: , Rfl:  lisinopril (PRINIVIL,ZESTRIL) 5 MG tablet, TAKE ONE TABLET BY MOUTH EVERY DAY, Disp: 90 tablet, Rfl: 3;  metFORMIN (GLUCOPHAGE-XR)  500 MG 24 hr tablet, TAKE 2 TABLETS (1,000 MG TOTAL) BY MOUTH DAILY WITH BREAKFAST., Disp: 60 tablet, Rfl: 0;  Multiple Vitamins-Minerals (MULTIVITAMIN WITH MINERALS) tablet, Take 1 tablet by mouth daily.  , Disp: , Rfl: ;  Omega-3 Fatty Acids (FISH OIL) 1360 MG CAPS, Take 1 capsule by mouth daily., Disp: , Rfl:  sildenafil (VIAGRA) 100 MG tablet, Take 0.5-1 tablets (50-100 mg total) by mouth daily as needed for erectile dysfunction., Disp: 2 tablet, Rfl: 11;  simvastatin (ZOCOR) 20 MG tablet, TAKE 1 TABLET BY MOUTH DAILY, Disp: 90 tablet, Rfl: 1;  terazosin (HYTRIN) 5 MG capsule, TAKE ONE CAPSULE BY MOUTH AT BEDTIME, Disp: 90 capsule, Rfl: 1  Allergies  Allergen Reactions  . Penicillins Rash    ROS:  Denies headaches, dizziness, chest pain, palpitations, allergies or URI symptoms, cough, shortness of breath, edema.  Denies  numbness/tingling/weakness, depression, anxiety, insomnia.  Up once a night to void. No urinary complaints. No nausea, vomiting, heartburn, bowel changes or other concerns.  PHYSICAL EXAM: BP 120/58  Pulse 60  Ht 5\' 11"  (1.803 m)  Wt 188 lb (85.276 kg)  BMI 26.23 kg/m2  Well developed, pleasant male in no distress  Neck: no lymphadenopathy, thyromegaly or carotid bruit  Heart: regular rate and rhythm without murmur  Lungs: clear bilaterally  Abdomen: soft, nontender, no organomegaly  Extremities: no edema, 2+ pulse  Skin: WHSS RUE, no rashes. Extensive actinic damage to both forearms. AK's also noted on lower legs. Psych: normal mood, affect, hygiene and grooming  Neuro: alert and oriented, normal gait, strength, sensation. Diabetic foot exam performed:  He said "yes" to feeling monofilament many times when it wasn't touching him.  ?accuracy of testing.  Redid, and had some decrease in sensation over L 3rd toe, remainder normal.   HbA1c 5.8  ASSESSMENT/PLAN:  Type II or unspecified type diabetes mellitus without mention of complication, not stated as uncontrolled - well controlled.   - Plan: HgB A1c, metFORMIN (GLUCOPHAGE-XR) 500 MG 24 hr tablet, Glucose, random, HM Diabetes Foot Exam  Need for prophylactic vaccination and inoculation against influenza - Plan: Flu vaccine HIGH DOSE PF (Fluzone Tri High dose)  Pure hypercholesterolemia - at goal on current meds per labs 6 months ago.  continue current med/dose, check LFT's today - Plan: simvastatin (ZOCOR) 20 MG tablet, Hepatic function panel   Lipids have been very consistent--can plan to check just once yearly in April. Pt fasting today--check glucose and LFT's.  If LFT's remain consistently normal, then plan to just check them once yearly at April CPE's (and October med checks can be nonfasting, with just A1c).

## 2012-12-02 ENCOUNTER — Encounter: Payer: Self-pay | Admitting: Family Medicine

## 2013-01-12 ENCOUNTER — Emergency Department (HOSPITAL_COMMUNITY)
Admission: EM | Admit: 2013-01-12 | Discharge: 2013-01-12 | Disposition: A | Discharge status: Home / Self Care | Payer: Medicare Other | Attending: Emergency Medicine | Admitting: Emergency Medicine

## 2013-01-12 DIAGNOSIS — Y929 Unspecified place or not applicable: Secondary | ICD-10-CM | POA: Insufficient documentation

## 2013-01-12 DIAGNOSIS — Z860100 Personal history of colon polyps, unspecified: Secondary | ICD-10-CM | POA: Insufficient documentation

## 2013-01-12 DIAGNOSIS — Z8669 Personal history of other diseases of the nervous system and sense organs: Secondary | ICD-10-CM | POA: Insufficient documentation

## 2013-01-12 DIAGNOSIS — W1809XA Striking against other object with subsequent fall, initial encounter: Secondary | ICD-10-CM | POA: Insufficient documentation

## 2013-01-12 DIAGNOSIS — Z8719 Personal history of other diseases of the digestive system: Secondary | ICD-10-CM | POA: Insufficient documentation

## 2013-01-12 DIAGNOSIS — Y939 Activity, unspecified: Secondary | ICD-10-CM | POA: Insufficient documentation

## 2013-01-12 DIAGNOSIS — E7800 Pure hypercholesterolemia, unspecified: Secondary | ICD-10-CM | POA: Insufficient documentation

## 2013-01-12 DIAGNOSIS — S0181XA Laceration without foreign body of other part of head, initial encounter: Secondary | ICD-10-CM

## 2013-01-12 DIAGNOSIS — Z7982 Long term (current) use of aspirin: Secondary | ICD-10-CM | POA: Insufficient documentation

## 2013-01-12 DIAGNOSIS — Z880 Allergy status to penicillin: Secondary | ICD-10-CM | POA: Insufficient documentation

## 2013-01-12 DIAGNOSIS — Z87448 Personal history of other diseases of urinary system: Secondary | ICD-10-CM | POA: Insufficient documentation

## 2013-01-12 DIAGNOSIS — S0180XA Unspecified open wound of other part of head, initial encounter: Secondary | ICD-10-CM | POA: Insufficient documentation

## 2013-01-12 DIAGNOSIS — H9190 Unspecified hearing loss, unspecified ear: Secondary | ICD-10-CM | POA: Insufficient documentation

## 2013-01-12 DIAGNOSIS — Z79899 Other long term (current) drug therapy: Secondary | ICD-10-CM | POA: Insufficient documentation

## 2013-01-12 DIAGNOSIS — E119 Type 2 diabetes mellitus without complications: Secondary | ICD-10-CM | POA: Insufficient documentation

## 2013-01-12 NOTE — ED Notes (Signed)
Pt states he tripped on the sidewalk. Pt denies LOC. Pt has lac to chin. Bleeding controlled with pressure. Pt states he is not on any blood thinners. Pt also has small abrasion to L knee. Pt ambulatory to exam room with steady gait. Pt a/o x 4.

## 2013-01-12 NOTE — ED Provider Notes (Signed)
CSN: [id number]     Arrival date & time 01/12/13  2102 History  This chart was scribed for non-physician practitioner Cherrie Distance, PA-C working with Shon Baton, MD by Caryn Bee, ED Scribe. This patient was seen in room WTR8/WTR8 and the patient's care was started at 9:24 PM.    Chief Complaint  Patient presents with  . Laceration   The history is provided by the patient and the spouse. No language interpreter was used.   HPI Comments: Andre Walker is a 79 y.o. male who presents to the Emergency Department complaining of chin laceration that occurred when pt tripped and fell causing him to hit his chin on cement. Pt rates his pain as 2/10. He is not taking any blood thinning medication. Pt denies LOC, dental pain, jaw pain, or any other symptoms. Pt's tetanus is UTD.    Past Medical History  Diagnosis Date  . Diabetes mellitus 2004  . BPH (benign prostatic hyperplasia)     controlled on Hytrin  . ED (erectile dysfunction)   . Colon polyps     Dr. Ewing Schlein  . Microalbuminuria     on ACEI for microalbuminuria (does NOT have HTN)  . Pure hypercholesterolemia 2006  . Actinic keratosis     Dr. Terri Piedra  . Traumatic partial tear of biceps tendon 01/2009    "popeye" injury  . Hearing loss     high frequency  . Hiatal hernia     small, noted on EGD 10/2010  . Wears glasses   . Wears partial dentures     lower   Past Surgical History  Procedure Laterality Date  . Total knee arthroplasty  2012    right  . Appendectomy  1955  . Inguinal hernia repair  1989    right  . Total knee arthroplasty  2005    left  . Colonoscopy  11/09, 10/29/10    Dr. Ewing Schlein  . Esophagogastroduodenoscopy  10/29/10    Dr. Ewing Schlein  . Melanoma excision  10/08/10    R upper arm, Dr. Daphine Deutscher  . Melanoma excision  03/16/2012    Procedure: MELANOMA EXCISION;  Surgeon: Valarie Merino, MD;  Location: [facility];  Service: General;  Laterality: Right;  excision of melanoma of right arm    Family History  Problem Relation Age of Onset  . Diabetes Sister   . Diabetes Brother   . Heart disease Brother   . Hyperlipidemia Brother   . Hypertension Brother   . Stroke Brother   . Diabetes Sister   . Cancer Paternal Uncle    History  Substance Use Topics  . Smoking status: Never Smoker   . Smokeless tobacco: Never Used  . Alcohol Use: 0.0 oz/week    2-3 Glasses of wine per week     Comment: 1 glass of wine 1-2 times per month.    Review of Systems  HENT: Negative for dental problem and facial swelling.   Musculoskeletal: Negative for joint swelling.  Skin: Positive for wound.  Neurological: Negative for syncope.  All other systems reviewed and are negative.    Allergies  Penicillins  Home Medications   Current Outpatient Rx  Name  Route  Sig  Dispense  Refill  . aspirin 81 MG tablet   Oral   Take 81 mg by mouth daily.           . Calcium Carbonate-Vitamin D (CALCIUM 600+D HIGH POTENCY) 600-400 MG-UNIT per tablet   Oral  Take 2 tablets by mouth daily.           . Coenzyme Q10 (COQ10) 100 MG CAPS   Oral   Take 1 capsule by mouth daily.           Marland Kitchen glucosamine-chondroitin 500-400 MG tablet   Oral   Take 2 tablets by mouth daily.           Marland Kitchen lisinopril (PRINIVIL,ZESTRIL) 5 MG tablet      TAKE ONE TABLET BY MOUTH EVERY DAY   90 tablet   3   . metFORMIN (GLUCOPHAGE-XR) 500 MG 24 hr tablet      TAKE 2 TABLETS (1,000 MG TOTAL) BY MOUTH DAILY WITH BREAKFAST.   60 tablet   5   . Multiple Vitamins-Minerals (MULTIVITAMIN WITH MINERALS) tablet   Oral   Take 1 tablet by mouth daily.           . Omega-3 Fatty Acids (FISH OIL) 1360 MG CAPS   Oral   Take 1 capsule by mouth daily.         . sildenafil (VIAGRA) 100 MG tablet   Oral   Take 0.5-1 tablets (50-100 mg total) by mouth daily as needed for erectile dysfunction.   2 tablet   11   . simvastatin (ZOCOR) 20 MG tablet      TAKE 1 TABLET BY MOUTH DAILY   90 tablet   1   .  terazosin (HYTRIN) 5 MG capsule      TAKE ONE CAPSULE BY MOUTH AT BEDTIME   90 capsule   1    BP 144/73  Pulse 57  Temp(Src) 97.8 F (36.6 C) (Oral)  Resp 16  SpO2 98%  Physical Exam  Nursing note and vitals reviewed. Constitutional: He is oriented to person, place, and time. He appears well-developed and well-nourished. No distress.  HENT:  Head: Normocephalic.  2 cm laceration noted to the mandible - hemostatic  Eyes: EOM are normal.  Neck: Neck supple. No tracheal deviation present.  Cardiovascular: Normal rate.   Pulmonary/Chest: Effort normal. No respiratory distress.  Musculoskeletal: Normal range of motion.  Neurological: He is alert and oriented to person, place, and time.  Skin: Skin is warm and dry. Laceration (Chin) noted.  Psychiatric: He has a normal mood and affect. His behavior is normal.    ED Course  Procedures (including critical care time) DIAGNOSTIC STUDIES: Oxygen Saturation is 98% on room air, normal by my interpretation.    COORDINATION OF CARE: 9:27 PM-Discussed treatment plan with pt at bedside and pt agreed to plan.   Labs Review Labs Reviewed - No data to display Imaging Review No results found.  EKG Interpretation   None     LACERATION REPAIR Performed by: Cherrie Distance C. Authorized by: Patrecia Pour Consent: Verbal consent obtained. Risks and benefits: risks, benefits and alternatives were discussed Consent given by: patient Patient identity confirmed: provided demographic data Prepped and Draped in normal sterile fashion Wound explored  Laceration Location: chin  Laceration Length: 2 cm  No Foreign Bodies seen or palpated  Anesthesia: local infiltration  Local anesthetic: lidocaine 2 % without epinephrine  Anesthetic total: 2 ml  Irrigation method: syringe Amount of cleaning: standard  Skin closure: 6.0 nylon  Number of sutures: 5  Technique: simple interrupted  Patient tolerance: Patient tolerated the  procedure well with no immediate complications.  MDM  Chin laceration   Patient here with mechanical fall after tripping on the pavement at [facility], no LOC,  normal mentation, no evidence of fracture of mandible or teeth.  Wound hemostatic, not on blood thinners/  Sutured and patient tolerated well.    I personally performed the services described in this documentation, which was scribed in my presence. The recorded information has been reviewed and is accurate.    Izola Price Marisue Humble, [place] 01/12/13 2158

## 2013-01-12 NOTE — ED Notes (Signed)
Bacitracin and band-aid placed on wound. Pt instructed on further wound care.  

## 2013-01-13 NOTE — ED Provider Notes (Signed)
Medical screening examination/treatment/procedure(s) were performed by non-physician practitioner and as supervising physician I was immediately available for consultation/collaboration.  EKG Interpretation   None         Courtney F Horton, MD 01/13/13 1240 

## 2013-02-11 ENCOUNTER — Telehealth: Payer: Self-pay | Admitting: Family Medicine

## 2013-02-11 DIAGNOSIS — N400 Enlarged prostate without lower urinary tract symptoms: Secondary | ICD-10-CM

## 2013-02-11 MED ORDER — TERAZOSIN HCL 5 MG PO CAPS: ORAL_CAPSULE | ORAL | Status: DC

## 2013-02-11 NOTE — Telephone Encounter (Signed)
Looks like Dr. Susann Givens has been refilling this med for the last year and a half.  PMH states BPH well controlled on Hytrin. Med expired so not on current list, but refills are under history.  Refill done.

## 2013-02-11 NOTE — Telephone Encounter (Signed)
This medication is not in his record. I need your help on this one.

## 2013-02-11 NOTE — Telephone Encounter (Signed)
Fax refill request from [facility] for   Terazosin 5mg  #90  Last filled  11/17/12

## 2013-03-23 LAB — HM DIABETES EYE EXAM

## 2013-03-24 ENCOUNTER — Encounter: Payer: Self-pay | Admitting: Internal Medicine

## 2013-03-28 ENCOUNTER — Encounter: Payer: Self-pay | Admitting: *Deleted

## 2013-04-15 ENCOUNTER — Other Ambulatory Visit: Payer: Self-pay | Admitting: Family Medicine

## 2013-04-28 ENCOUNTER — Other Ambulatory Visit: Payer: Self-pay | Admitting: *Deleted

## 2013-04-28 ENCOUNTER — Telehealth: Payer: Self-pay | Admitting: Family Medicine

## 2013-04-28 DIAGNOSIS — E119 Type 2 diabetes mellitus without complications: Secondary | ICD-10-CM

## 2013-04-28 MED ORDER — METFORMIN HCL ER 500 MG PO TB24: ORAL_TABLET | ORAL | Status: DC

## 2013-04-28 NOTE — Telephone Encounter (Signed)
done

## 2013-04-28 NOTE — Telephone Encounter (Signed)
Done

## 2013-05-30 ENCOUNTER — Telehealth: Payer: Self-pay | Admitting: Internal Medicine

## 2013-05-30 DIAGNOSIS — E119 Type 2 diabetes mellitus without complications: Secondary | ICD-10-CM

## 2013-05-30 MED ORDER — METFORMIN HCL ER 500 MG PO TB24: ORAL_TABLET | ORAL | Status: DC

## 2013-05-30 NOTE — Telephone Encounter (Signed)
Refill request for metformin ER 500mg  #60 to [facility]

## 2013-05-30 NOTE — Telephone Encounter (Signed)
Medication refilled

## 2013-06-08 ENCOUNTER — Encounter: Payer: Medicare Other | Admitting: Family Medicine

## 2013-06-11 ENCOUNTER — Other Ambulatory Visit: Payer: Self-pay | Admitting: Family Medicine

## 2013-08-12 ENCOUNTER — Telehealth: Payer: Self-pay

## 2013-08-12 DIAGNOSIS — N400 Enlarged prostate without lower urinary tract symptoms: Secondary | ICD-10-CM

## 2013-08-12 NOTE — Telephone Encounter (Signed)
Medication refill request for Terazosin 5mg  sent to [facility]

## 2013-08-15 MED ORDER — TERAZOSIN HCL 5 MG PO CAPS: ORAL_CAPSULE | ORAL | Status: DC

## 2013-08-15 NOTE — Telephone Encounter (Signed)
Done

## 2013-08-22 ENCOUNTER — Other Ambulatory Visit: Payer: Self-pay | Admitting: Family Medicine

## 2013-09-12 ENCOUNTER — Encounter: Payer: Self-pay | Admitting: Family Medicine

## 2013-09-12 ENCOUNTER — Ambulatory Visit (INDEPENDENT_AMBULATORY_CARE_PROVIDER_SITE_OTHER): Payer: Medicare Other | Admitting: Family Medicine

## 2013-09-12 VITALS — BP 118/68 | HR 60 | Ht 71.0 in | Wt 191.0 lb

## 2013-09-12 DIAGNOSIS — R809 Proteinuria, unspecified: Secondary | ICD-10-CM

## 2013-09-12 DIAGNOSIS — E7800 Pure hypercholesterolemia, unspecified: Secondary | ICD-10-CM

## 2013-09-12 DIAGNOSIS — N400 Enlarged prostate without lower urinary tract symptoms: Secondary | ICD-10-CM

## 2013-09-12 DIAGNOSIS — E119 Type 2 diabetes mellitus without complications: Secondary | ICD-10-CM

## 2013-09-12 DIAGNOSIS — L570 Actinic keratosis: Secondary | ICD-10-CM

## 2013-09-12 DIAGNOSIS — Z0000 Encounter for general adult medical examination without abnormal findings: Secondary | ICD-10-CM

## 2013-09-12 DIAGNOSIS — Z79899 Other long term (current) drug therapy: Secondary | ICD-10-CM

## 2013-09-12 DIAGNOSIS — Z125 Encounter for screening for malignant neoplasm of prostate: Secondary | ICD-10-CM

## 2013-09-12 DIAGNOSIS — Z23 Encounter for immunization: Secondary | ICD-10-CM

## 2013-09-12 LAB — CBC WITH DIFFERENTIAL/PLATELET
Basophils Absolute: 0 10*3/uL (ref 0.0–0.1)
Basophils Relative: 0 % (ref 0–1)
EOS ABS: 0.1 10*3/uL (ref 0.0–0.7)
Eosinophils Relative: 2 % (ref 0–5)
HCT: 41 % (ref 39.0–52.0)
Hemoglobin: 14 g/dL (ref 13.0–17.0)
Lymphocytes Relative: 18 % (ref 12–46)
Lymphs Abs: 1.3 10*3/uL (ref 0.7–4.0)
MCH: 31.4 pg (ref 26.0–34.0)
MCHC: 34.1 g/dL (ref 30.0–36.0)
MCV: 91.9 fL (ref 78.0–100.0)
Monocytes Absolute: 0.4 10*3/uL (ref 0.1–1.0)
Monocytes Relative: 6 % (ref 3–12)
Neutro Abs: 5.4 10*3/uL (ref 1.7–7.7)
Neutrophils Relative %: 74 % (ref 43–77)
Platelets: 251 10*3/uL (ref 150–400)
RBC: 4.46 MIL/uL (ref 4.22–5.81)
RDW: 13.4 % (ref 11.5–15.5)
WBC: 7.3 10*3/uL (ref 4.0–10.5)

## 2013-09-12 LAB — LIPID PANEL
CHOL/HDL RATIO: 2.8 ratio
Cholesterol: 125 mg/dL (ref 0–200)
HDL: 45 mg/dL (ref >39)
LDL Cholesterol: 57 mg/dL (ref 0–99)
Triglycerides: 117 mg/dL (ref <150)
VLDL: 23 mg/dL (ref 0–40)

## 2013-09-12 LAB — COMPREHENSIVE METABOLIC PANEL
ALBUMIN: 4.2 g/dL (ref 3.5–5.2)
ALK PHOS: 76 U/L (ref 39–117)
ALT: 20 U/L (ref 0–53)
AST: 17 U/L (ref 0–37)
BUN: 21 mg/dL (ref 6–23)
CALCIUM: 9.3 mg/dL (ref 8.4–10.5)
CHLORIDE: 103 meq/L (ref 96–112)
CO2: 26 mEq/L (ref 19–32)
Creat: 1 mg/dL (ref 0.50–1.35)
GLUCOSE: 123 mg/dL — AB (ref 70–99)
POTASSIUM: 4.5 meq/L (ref 3.5–5.3)
SODIUM: 135 meq/L (ref 135–145)
TOTAL PROTEIN: 6.8 g/dL (ref 6.0–8.3)
Total Bilirubin: 0.7 mg/dL (ref 0.2–1.2)

## 2013-09-12 LAB — POCT GLYCOSYLATED HEMOGLOBIN (HGB A1C): HEMOGLOBIN A1C: 6.1

## 2013-09-12 LAB — TSH: TSH: 3.346 u[IU]/mL (ref 0.350–4.500)

## 2013-09-12 MED ORDER — SIMVASTATIN 20 MG PO TABS: 20.0000 mg | ORAL_TABLET | Freq: Every day | ORAL | Status: DC

## 2013-09-12 MED ORDER — LISINOPRIL 5 MG PO TABS: 5.0000 mg | ORAL_TABLET | Freq: Every day | ORAL | Status: DC

## 2013-09-12 MED ORDER — TERAZOSIN HCL 5 MG PO CAPS
ORAL_CAPSULE | ORAL | Status: DC
Start: 2013-09-12 — End: 2014-09-18

## 2013-09-12 MED ORDER — METFORMIN HCL ER 500 MG PO TB24: ORAL_TABLET | ORAL | Status: DC

## 2013-09-12 NOTE — Progress Notes (Signed)
Chief Complaint  Patient presents with  . Med check plus    fasting med check plus/AWV. No concerns today. Wants all med to go to [facility], except Metformin(30 day supply) and simvastatin (90 day) to go to [facility].    Andre Walker is a 80 y.o. male who presents for a med check (f/u DM, hyperlipidemia) and Annual Wellness Visit. He has no specific complaints today.   Diabetes follow-up: Blood sugars at home are running 100-139. Denies hypoglycemia. Denies polydipsia and polyuria. Last eye exam was 02/2013, scheduled for 02/2014. Patient follows a low sugar diet and checks feet regularly without concerns.   Hyperlipidemia follow-up: Patient is reportedly following a low-fat, low cholesterol diet. Compliant with medications and denies medication side effects. He had some aches when originally changed from Zocor to the generic, but coenzyme Q10 resolved those symptoms. He is wondering whether or not he still needs to take it.  Going to the [facility] 3 times/week and playing golf and walks 30 minutes daily.  Annual Wellness Visit:  See attached/scanned questionnaires--ADL and depression screens are negative. He had 1 fall in October 2014 where he tripped on sidewalk--needed stitches in his chin. Other doctors caring for patient:  Dr. Hassell Done (general surgeon, did melanoma excision, no regular f/u)  Dr. Allyson Sabal (dermatologist) --sees every 6 months Dr. Katy Fitch (ophtho) --yearly in January Dr. Lavone Neri (dentist) --twice yearly Dr. Watt Climes (GI)   They now live at [facility] (independent living)  End of life issues: He has living will and healthcare power of attorney   Immunization History  Administered Date(s) Administered  . Influenza Split 12/02/2010, 12/03/2011  . Influenza, High Dose Seasonal PF 12/01/2012  . Pneumococcal Polysaccharide-23 12/25/2004, 06/04/2011  . Td 04/24/2004  . Tdap 12/02/2010  . Zoster 10/23/2007   Last colonoscopy: 10/2010  Last PSA:  05/2012 Ophtho: 02/2013, yearly Dentist: twice yearly  Exercise: [employer] 3x/week, golf and walks  Past Medical History  Diagnosis Date  . Diabetes mellitus 2004  . BPH (benign prostatic hyperplasia)     controlled on Hytrin  . ED (erectile dysfunction)   . Colon polyps     Dr. Watt Climes  . Microalbuminuria     on ACEI for microalbuminuria (does NOT have HTN)  . Pure hypercholesterolemia 2006  . Actinic keratosis     Dr. Allyson Sabal  . Traumatic partial tear of biceps tendon 01/2009    "popeye" injury  . Hearing loss     high frequency  . Hiatal hernia     small, noted on EGD 10/2010  . Wears glasses   . Wears partial dentures     lower  . Melanoma     right arm    Past Surgical History  Procedure Laterality Date  . Total knee arthroplasty Right 2012  . Appendectomy  1955  . Inguinal hernia repair Right 1989  . Total knee arthroplasty Left 2005  . Colonoscopy  11/09, 10/29/10    Dr. Watt Climes  . Esophagogastroduodenoscopy  10/29/10    Dr. Watt Climes  . Melanoma excision  10/08/10    R upper arm, Dr. Hassell Done  . Melanoma excision  03/16/2012    Procedure: MELANOMA EXCISION;  Surgeon: Pedro Earls, MD;  Location: [facility];  Service: General;  Laterality: Right;  excision of melanoma of right arm    History   Social History  . Marital Status: Married    Spouse Name: N/A    Number of Children: 2  .  Years of Education: N/A   Occupational History  . Retired (from post office)    Social History Main Topics  . Smoking status: Never Smoker   . Smokeless tobacco: Never Used  . Alcohol Use: 0.0 oz/week    2-3 Glasses of wine per week     Comment: 1 glass of wine 1-2 times per month.  . Drug Use: No  . Sexual Activity: Not on file   Other Topics Concern  . Not on file   Social History Narrative   1 son in [place], 1 son in [place].  Still does some handyman work.  Moved to [place] 09/2012    Family History  Problem Relation Age of Onset  .  Diabetes Sister   . Diabetes Brother   . Heart disease Brother   . Hyperlipidemia Brother   . Hypertension Brother   . Stroke Brother   . Diabetes Sister   . Cancer Paternal Uncle    Outpatient Encounter Prescriptions as of 09/12/2013  Medication Sig  . aspirin 81 MG tablet Take 81 mg by mouth daily.    . Calcium Carbonate-Vitamin D (CALCIUM 600+D HIGH POTENCY) 600-400 MG-UNIT per tablet Take 2 tablets by mouth daily.    . Coenzyme Q10 (COQ10) 100 MG CAPS Take 1 capsule by mouth daily.    Marland Kitchen glucosamine-chondroitin 500-400 MG tablet Take 2 tablets by mouth daily.   Marland Kitchen lisinopril (PRINIVIL,ZESTRIL) 5 MG tablet Take 1 tablet (5 mg total) by mouth daily. TAKE ONE TABLET BY MOUTH EVERY DAY  . metFORMIN (GLUCOPHAGE-XR) 500 MG 24 hr tablet TAKE 2 TABLETS (1,000 MG TOTAL) BY MOUTH DAILY WITH BREAKFAST.  . Multiple Vitamins-Minerals (MULTIVITAMIN WITH MINERALS) tablet Take 1 tablet by mouth daily.    . Omega-3 Fatty Acids (FISH OIL) 1360 MG CAPS Take 1 capsule by mouth daily.  . simvastatin (ZOCOR) 20 MG tablet Take 1 tablet (20 mg total) by mouth at bedtime.  Marland Kitchen terazosin (HYTRIN) 5 MG capsule TAKE ONE CAPSULE BY MOUTH AT BEDTIME  . [DISCONTINUED] lisinopril (PRINIVIL,ZESTRIL) 5 MG tablet TAKE ONE TABLET BY MOUTH EVERY DAY  . [DISCONTINUED] metFORMIN (GLUCOPHAGE-XR) 500 MG 24 hr tablet TAKE 2 TABLETS (1,000 MG TOTAL) BY MOUTH DAILY WITH BREAKFAST.  . [DISCONTINUED] simvastatin (ZOCOR) 20 MG tablet Take 20 mg by mouth at bedtime. TAKE 1 TABLET BY MOUTH DAILY  . [DISCONTINUED] terazosin (HYTRIN) 5 MG capsule TAKE ONE CAPSULE BY MOUTH AT BEDTIME  . [DISCONTINUED] lisinopril (PRINIVIL,ZESTRIL) 5 MG tablet TAKE ONE TABLET BY MOUTH ONCE DAILY  . [DISCONTINUED] metFORMIN (GLUCOPHAGE-XR) 500 MG 24 hr tablet TAKE 2 TABLETS (1,000 MG TOTAL) BY MOUTH DAILY WITH BREAKFAST.  . [DISCONTINUED] simvastatin (ZOCOR) 20 MG tablet TAKE 1 TABLET BY MOUTH DAILY   Allergies  Allergen Reactions  . Penicillins Rash    ROS: The patient denies anorexia, fever, weight changes, headaches, vision loss, ear pain, hoarseness, chest pain, palpitations, dizziness, syncope, dyspnea on exertion, cough, swelling, nausea, vomiting, diarrhea, constipation, abdominal pain, melena, hematochezia, indigestion/heartburn, hematuria, incontinence, nocturia (just once a night), weakened urine stream, dysuria, genital lesions, joint pains, numbness, tingling, weakness, tremor, suspicious skin lesions (sees derm and is getting AK's treated), depression, anxiety, abnormal bleeding/bruising, or enlarged lymph nodes.  +ED.  Viagra was effective, but it was too expensive Some high frequency hearing loss, and "selective hearing" per wife. No other hearing problems noted. Denies memory concerns  PHYSICAL EXAM:  BP 118/68  Pulse 60  Ht 5\' 11"  (1.803 m)  Wt 191 lb (  86.637 kg)  BMI 26.65 kg/m2  General Appearance:  Alert, cooperative, no distress, appears stated age   Head:  Normocephalic, without obvious abnormality, atraumatic   Eyes:  PERRL, conjunctiva/corneas clear, EOM's intact, fundi  benign   Ears:  Normal TM's and external ear canals   Nose:  Nares normal, mucosa normal, no drainage or sinus tenderness   Throat:  Lips, mucosa, and tongue normal; teeth and gums normal   Neck:  Supple, no lymphadenopathy; thyroid: no enlargement/tenderness/nodules; no carotid  bruit or JVD   Back:  Spine nontender, no curvature, ROM normal, no CVA tenderness   Lungs:  Clear to auscultation bilaterally without wheezes, rales or ronchi; respirations unlabored   Chest Wall:  No tenderness or deformity   Heart:  Regular rate and rhythm, S1 and S2 normal, no murmur, rub  or gallop  Occasional skipped beat.  Breast Exam:  No chest wall tenderness, masses or gynecomastia   Abdomen:  Soft, non-tender, nondistended, normoactive bowel sounds,  no masses, no hepatosplenomegaly   Genitalia:  Normal male external genitalia without lesions. Testicles  without masses. No inguinal hernias.   Rectal:  Normal sphincter tone, no masses or tenderness; guaiac negative stool. Prostate smooth, no nodules, mildly enlarged on R lobe>L. No nodules   Extremities:  No clubbing, cyanosis or edema   Pulses:  2+ and symmetric all extremities   Skin:  Skin color, texture, turgor normal, no rashes. Many AK's on forearms bilaterally, and on LE's.  Onychomycotic great toenails. Decreased monofilament sensation to R great toe.  Callous (black/purple) on tip of R 2nd toe, very recently injured  Lymph nodes:  Cervical, supraclavicular, and axillary nodes normal   Neurologic:  CNII-XII intact, normal strength, sensation and gait; reflexes 2+ and symmetric throughout          Psych: Normal mood, affect, hygiene and grooming.    Lab Results  Component Value Date   HGBA1C 6.1 09/12/2013   ASSESSMENT/PLAN:  Medicare annual wellness visit, subsequent  Type II or unspecified type diabetes mellitus without mention of complication, not stated as uncontrolled - controlled - Plan: HgB A1c, metFORMIN (GLUCOPHAGE-XR) 500 MG 24 hr tablet, HM Diabetes Foot Exam, Comprehensive metabolic panel, TSH, Microalbumin / creatinine urine ratio  Pure hypercholesterolemia - Plan: simvastatin (ZOCOR) 20 MG tablet, Lipid panel, Comprehensive metabolic panel  BPH (benign prostatic hyperplasia) - Plan: terazosin (HYTRIN) 5 MG capsule  Microalbuminuria - resolved; continue low dose ACEI (never had h/o HTN) - Plan: lisinopril (PRINIVIL,ZESTRIL) 5 MG tablet, Microalbumin / creatinine urine ratio  Encounter for long-term (current) use of other medications - Plan: Comprehensive metabolic panel, CBC with Differential  Special screening for malignant neoplasm of prostate - Plan: PSA, Medicare  Need for prophylactic vaccination against Streptococcus pneumoniae (pneumococcus) - Plan: Pneumococcal conjugate vaccine 13-valent  Actinic keratoses - on all extremities, upper>lower.  continue routine  care/treatments with Dr. Allyson Sabal   Discussed PSA screening (risks/benefits), recommended at least 30 minutes of aerobic activity at least 5 days/week; proper sunscreen use reviewed; healthy diet and alcohol recommendations (less than or equal to 2 drinks/day) reviewed; regular seatbelt use; changing batteries in smoke detectors. Immunization recommendations discussed--Prevnar 13 today; high dose flu shot in the fall. Colonoscopy recommendations reviewed, UTD.  Discussed whether or not he might need another (to discuss with Dr. Juventino Slovak not due until 2017)  AWV: MOST form reviewed and updated.  Full Code, Full Care.  Can try stopping the coenzyme Q10--restart is muscle aches or side effects from the statin  recur after being off of it.  F/u with podiatrist if callous/lesion on right 2nd toe doesn't completely resolve  F/u 6 months, sooner prn

## 2013-09-12 NOTE — Patient Instructions (Addendum)
  HEALTH MAINTENANCE RECOMMENDATIONS:  It is recommended that you get at least 30 minutes of aerobic exercise at least 5 days/week (for weight loss, you may need as much as 60-90 minutes). This can be any activity that gets your heart rate up. This can be divided in 10-15 minute intervals if needed, but try and build up your endurance at least once a week.  Weight bearing exercise is also recommended twice weekly.  Eat a healthy diet with lots of vegetables, fruits and fiber.  "Colorful" foods have a lot of vitamins (ie green vegetables, tomatoes, red peppers, etc).  Limit sweet tea, regular sodas and alcoholic beverages, all of which has a lot of calories and sugar.  Up to 2 alcoholic drinks daily may be beneficial for men (unless trying to lose weight, watch sugars).  Drink a lot of water.  Sunscreen of at least SPF 30 should be used on all sun-exposed parts of the skin when outside between the hours of 10 am and 4 pm (not just when at beach or pool, but even with exercise, golf, tennis, and yard work!)  Use a sunscreen that says "broad spectrum" so it covers both UVA and UVB rays, and make sure to reapply every 1-2 hours.  Remember to change the batteries in your smoke detectors when changing your clock times in the spring and fall.  Use your seat belt every time you are in a car, and please drive safely and not be distracted with cell phones and texting while driving.  You can try stopping the coenzyme Q10--restart is muscle aches or side effects from the statin recur after being off of it.  Schedule a nurse visit for September/October for high dose flu shot

## 2013-09-13 ENCOUNTER — Encounter: Payer: Self-pay | Admitting: Family Medicine

## 2013-09-13 LAB — MICROALBUMIN / CREATININE URINE RATIO
CREATININE, URINE: 92.2 mg/dL
Microalb Creat Ratio: 11 mg/g (ref 0.0–30.0)
Microalb, Ur: 1.01 mg/dL (ref 0.00–1.89)

## 2013-09-13 LAB — PSA, MEDICARE: PSA: 2.89 ng/mL (ref <=4.00)

## 2013-11-21 ENCOUNTER — Other Ambulatory Visit: Payer: Self-pay | Admitting: Dermatology

## 2013-12-12 ENCOUNTER — Other Ambulatory Visit (INDEPENDENT_AMBULATORY_CARE_PROVIDER_SITE_OTHER): Payer: Medicare Other

## 2013-12-12 DIAGNOSIS — Z23 Encounter for immunization: Secondary | ICD-10-CM

## 2013-12-13 ENCOUNTER — Other Ambulatory Visit: Payer: Medicare Other

## 2014-01-13 ENCOUNTER — Other Ambulatory Visit: Payer: Self-pay | Admitting: Family Medicine

## 2014-03-09 ENCOUNTER — Other Ambulatory Visit: Payer: Self-pay | Admitting: Family Medicine

## 2014-03-14 ENCOUNTER — Telehealth: Payer: Self-pay | Admitting: Internal Medicine

## 2014-03-14 DIAGNOSIS — E7800 Pure hypercholesterolemia, unspecified: Secondary | ICD-10-CM

## 2014-03-14 DIAGNOSIS — E119 Type 2 diabetes mellitus without complications: Secondary | ICD-10-CM

## 2014-03-14 DIAGNOSIS — R972 Elevated prostate specific antigen [PSA]: Secondary | ICD-10-CM

## 2014-03-14 NOTE — Telephone Encounter (Signed)
I reviewed labs--I was doing to discuss possibly rechecking PSA at his visit, since it jumped a point in the last year.  I'm concerned about the velocity of rise, even though the total is well within the normal range.  When I tried to sign the order (using abnormal PSA velocity as diagnosis), it said insurance didn't cover the test.  He would need to sign ABN and be responsible for fee--which is something I planned to discuss with him at visit.  If he is desiring the labs doe be done in advance, then he needs to decide if he would like to have the PSA done (and sign the waiver, that he may need to pay for it), or elect to hold off on having it done.  I have the lab orders pended--it won't let me sign them without making a decision about the PSA, which requires discussion with patient (and ABN signed).

## 2014-03-14 NOTE — Telephone Encounter (Signed)
Pt will just wait and discuss this with you on his Wednesday appt

## 2014-03-14 NOTE — Telephone Encounter (Signed)
Pt called stating he would like to come in this coming Monday for his lab work that way he can have his labs for his Wednesday appt. Is this okay if pt comes in

## 2014-03-22 ENCOUNTER — Encounter: Payer: Self-pay | Admitting: Family Medicine

## 2014-03-22 ENCOUNTER — Ambulatory Visit (INDEPENDENT_AMBULATORY_CARE_PROVIDER_SITE_OTHER): Payer: Medicare Other | Admitting: Family Medicine

## 2014-03-22 VITALS — BP 120/64 | HR 60 | Ht 71.0 in | Wt 197.0 lb

## 2014-03-22 DIAGNOSIS — N400 Enlarged prostate without lower urinary tract symptoms: Secondary | ICD-10-CM

## 2014-03-22 DIAGNOSIS — Z125 Encounter for screening for malignant neoplasm of prostate: Secondary | ICD-10-CM

## 2014-03-22 DIAGNOSIS — E119 Type 2 diabetes mellitus without complications: Secondary | ICD-10-CM

## 2014-03-22 DIAGNOSIS — E7800 Pure hypercholesterolemia, unspecified: Secondary | ICD-10-CM

## 2014-03-22 DIAGNOSIS — R809 Proteinuria, unspecified: Secondary | ICD-10-CM

## 2014-03-22 DIAGNOSIS — R972 Elevated prostate specific antigen [PSA]: Secondary | ICD-10-CM

## 2014-03-22 MED ORDER — SIMVASTATIN 20 MG PO TABS: 20.0000 mg | ORAL_TABLET | Freq: Every day | ORAL | Status: DC

## 2014-03-22 MED ORDER — METFORMIN HCL ER 500 MG PO TB24: ORAL_TABLET | ORAL | Status: DC

## 2014-03-22 NOTE — Progress Notes (Signed)
Chief Complaint  Patient presents with  . Diabetes    nonfasting med check-has labs on chart.    He just started going to the [place]. He brings in copies of bloodwork done earlier this month at the [place]. See scanned labs. Results show: Lipids: total chol 131, TG 99, HDL 45, LDL 66.  A1c 6.5%.  Urine microalbumin/Cr ratio normal.   Normal CBC.  b-met normal except for glucose 143.  Normal LFT's.   Hep C antibody negative.  Diabetes follow-up: Blood sugars at home are running 105-140. Denies hypoglycemia. Denies polydipsia and polyuria. Last eye exam was 02/2013, scheduled for 04/2014. Patient follows a low sugar diet and checks feet regularly without concerns.  Denies numbness (very rare), no tingling, burning or sores.  Hyperlipidemia follow-up: Patient is reportedly following a low-fat, low cholesterol diet. Compliant with medications and denies medication side effects. He had some aches when originally changed from Zocor to the generic, but coenzyme Q10 resolved those symptoms. He has been on this regimen now for a few years, and denies any problems or side effects.  Going to the [facility] 3 times/week or exercises at the gym where he lives,and walks 30 minutes daily when weather permits. He usually plays golf once weekly (when weather permits, not recently).  BPH:  He has been on Hytrin for years without side effects.  He gets up 0-1 times per night.  Denies hesitancy or weakened stream.  Bladder empties well.  He was noted to have elevated PSA velocity--noted to have 0.9 increase in PSA between 2014 and 2015.  It is still well within the normal range Lab Results  Component Value Date   PSA 2.89 09/12/2013   PSA 1.96 06/02/2012   PSA 1.94 06/04/2011   ED:  [facility].  Dose is 128m, and usually 1/2 tablet is effective.  He got 6 tablets, and will plan to get refills through them in future.  PMH, PSH, SH and FH were reviewed and updated.  Outpatient  Encounter Prescriptions as of 03/22/2014  Medication Sig  . aspirin 81 MG tablet Take 81 mg by mouth daily.    . Calcium Carbonate-Vitamin D (CALCIUM 600+D HIGH POTENCY) 600-400 MG-UNIT per tablet Take 2 tablets by mouth daily.    . Coenzyme Q10 (COQ10) 100 MG CAPS Take 1 capsule by mouth daily.    .Marland Kitchenglucosamine-chondroitin 500-400 MG tablet Take 2 tablets by mouth daily.   .Marland Kitchenlisinopril (PRINIVIL,ZESTRIL) 5 MG tablet Take 1 tablet (5 mg total) by mouth daily. TAKE ONE TABLET BY MOUTH EVERY DAY  . metFORMIN (GLUCOPHAGE-XR) 500 MG 24 hr tablet TAKE 2 TABLETS (1,000 MG TOTAL) BY MOUTH DAILY WITH BREAKFAST.  . Multiple Vitamins-Minerals (MULTIVITAMIN WITH MINERALS) tablet Take 1 tablet by mouth daily.    . Omega-3 Fatty Acids (FISH OIL) 1360 MG CAPS Take 1 capsule by mouth daily.  . simvastatin (ZOCOR) 20 MG tablet Take 1 tablet (20 mg total) by mouth at bedtime.  .Marland Kitchenterazosin (HYTRIN) 5 MG capsule TAKE ONE CAPSULE BY MOUTH AT BEDTIME  . [DISCONTINUED] metFORMIN (GLUCOPHAGE-XR) 500 MG 24 hr tablet TAKE 2 TABLETS (1,000 MG TOTAL) BY MOUTH DAILY WITH BREAKFAST.  . [DISCONTINUED] simvastatin (ZOCOR) 20 MG tablet Take 1 tablet (20 mg total) by mouth at bedtime.  . [DISCONTINUED] simvastatin (ZOCOR) 20 MG tablet TAKE 1 TABLET BY MOUTH DAILY AT BEDTIME   Allergies  Allergen Reactions  . Penicillins Rash   ROS: denies  fevers, chills, URI symptoms (had one recently, resolved).  Denies cough, shortness of breath, chest pain, palpitations, nausea, vomiting, heartburn, bowel changes, bleeding, bruising, rashes.  Sees Dr. Allyson Sabal regularly.  Denies depression, anxiety, insomnia.  He fell once 3 months ago--tripped on a raised portion of sidewalk.  No injuries.  Denies any balance problems.  See HPI.  PHYSICAL EXAM: BP 120/64 mmHg  Pulse 60  Ht 5' 11"  (1.803 m)  Wt 197 lb (89.359 kg)  BMI 27.49 kg/m2  Well developed, pleasant male in no distress  Neck: no lymphadenopathy, thyromegaly or carotid bruit   Heart: regular rate and rhythm without murmur  Lungs: clear bilaterally  Abdomen: soft, nontender, no organomegaly  Extremities: no edema, 2+ pulse  Skin: WHSS RUE, no rashes. Extensive actinic damage to both forearms. Psych: normal mood, affect, hygiene and grooming  Neuro: alert and oriented, normal gait, strength, sensation. Normal diabetic foot exam Thickening and discoloration of 1st and 5th toes bilaterally, c/w onychomycosis  ASSESSMENT/PLAN:  Pure hypercholesterolemia - at goal - Plan: simvastatin (ZOCOR) 20 MG tablet  Type 2 diabetes mellitus, controlled - Plan: metFORMIN (GLUCOPHAGE-XR) 500 MG 24 hr tablet  Increased prostate specific antigen (PSA) velocity - no BPH/prostate symptoms. recheck PSA toay and refer if continues to rise; otherwise recheck at CPE to further monitor - Plan: PSA, Medicare  BPH (benign prostatic hypertrophy) - controlled by Hytrin - Plan: PSA, Medicare  Microalbuminuria - resolved; continue on ACEI   Elevated PSA velocity.  Discussed this in detail.  The cost of PSA is $45 and he is willing to pay this if not covered by insurance.  A 6 month check is recommended today.  If continued elevation noted, will refer to urology.   F/u 6 months--Med check plus/AWV Labs prior (delete future order for PSA if not needed--ie if PSA is now back to prior range)

## 2014-03-22 NOTE — Patient Instructions (Signed)
Continue your current medications.   Get your annual diabetic eye exam as scheduled. Continue daily exercise.  Return in 6 months for your physical, sooner if any problems.  We will contact you with the PSA results within the next 2 days.

## 2014-03-23 LAB — PSA, MEDICARE: PSA: 2.25 ng/mL (ref <=4.00)

## 2014-03-29 ENCOUNTER — Encounter: Payer: Self-pay | Admitting: Family Medicine

## 2014-03-29 ENCOUNTER — Ambulatory Visit (INDEPENDENT_AMBULATORY_CARE_PROVIDER_SITE_OTHER): Payer: Medicare Other | Admitting: Family Medicine

## 2014-03-29 VITALS — BP 130/62 | HR 64 | Temp 99.6°F | Ht 71.0 in | Wt 194.0 lb

## 2014-03-29 DIAGNOSIS — R350 Frequency of micturition: Secondary | ICD-10-CM

## 2014-03-29 DIAGNOSIS — N10 Acute tubulo-interstitial nephritis: Secondary | ICD-10-CM | POA: Diagnosis not present

## 2014-03-29 LAB — POCT URINALYSIS DIPSTICK
BILIRUBIN UA: NEGATIVE
GLUCOSE UA: NEGATIVE
Ketones, UA: NEGATIVE
Nitrite, UA: POSITIVE
Spec Grav, UA: 1.02
Urobilinogen, UA: NEGATIVE
pH, UA: 6

## 2014-03-29 MED ORDER — CIPROFLOXACIN HCL 500 MG PO TABS: 500.0000 mg | ORAL_TABLET | Freq: Two times a day (BID) | ORAL | Status: DC

## 2014-03-29 NOTE — Patient Instructions (Signed)
  Drink plenty of fluids (noncaffeinated). Take the antibiotic twice daily. Continue tylenol as needed for fever/chills. Go to the emergency room if your are vomiting, and unable to take your medication. Call us in 2 days if your fever is continuing/worse, if you aren't tolerating the medication, any nausea/vomiting, or other concerns.

## 2014-03-29 NOTE — Progress Notes (Signed)
Chief Complaint  Patient presents with  . Fever    just doesn't feel well. Feels shaky, he has the chills and is sweating.  Drank tea on Sunday (which he never does) and he had some increased urinary frequency Sun, Mon and last night. Reports decreased stream as well. Went to nurse at his residence yesterday @ 3:00pm had temp of 101.   2 days ago he started feeling bad--chills, shaky.  The day prior he had some tea with his dinner, and had gotten up 4x that night to void--he thought it might have been from the tea.  He didn't have any tea the following day, but nocturia recurred that night.  Doesn't notice much urinary frequency during the day, but up 4x/night to void.  He noticed a weaker stream yesterday, seems stronger today.  Urine has an odor.  Denies any hematuria.  Denies flank pain. Yesterday he had some mild lower abdominal pain, across whole abdomen--denies pain today. She saw the nurse at [facility] yesterday, and had temp of 101.  PMH, PSH, SH reviewed.  Outpatient Encounter Prescriptions as of 03/29/2014  Medication Sig Note  . acetaminophen (TYLENOL) 325 MG tablet Take 650 mg by mouth every 6 (six) hours as needed. 03/29/2014: Last dose 3am this morning.   Marland Kitchen aspirin 81 MG tablet Take 81 mg by mouth daily.     . Calcium Carbonate-Vitamin D (CALCIUM 600+D HIGH POTENCY) 600-400 MG-UNIT per tablet Take 2 tablets by mouth daily.     . Coenzyme Q10 (COQ10) 100 MG CAPS Take 1 capsule by mouth daily.     Marland Kitchen glucosamine-chondroitin 500-400 MG tablet Take 2 tablets by mouth daily.    Marland Kitchen lisinopril (PRINIVIL,ZESTRIL) 5 MG tablet Take 1 tablet (5 mg total) by mouth daily. TAKE ONE TABLET BY MOUTH EVERY DAY   . metFORMIN (GLUCOPHAGE-XR) 500 MG 24 hr tablet TAKE 2 TABLETS (1,000 MG TOTAL) BY MOUTH DAILY WITH BREAKFAST.   . Multiple Vitamins-Minerals (MULTIVITAMIN WITH MINERALS) tablet Take 1 tablet by mouth daily.     . Omega-3 Fatty Acids (FISH OIL) 1360 MG CAPS Take 1 capsule by mouth  daily.   . simvastatin (ZOCOR) 20 MG tablet Take 1 tablet (20 mg total) by mouth at bedtime.   Marland Kitchen terazosin (HYTRIN) 5 MG capsule TAKE ONE CAPSULE BY MOUTH AT BEDTIME   last dose of tylenol was 3am (7 hours ago).  Allergies  Allergen Reactions  . Penicillins Rash   ROS:  Denies nausea, vomiting.  Denies bleeding, bruising, rash.  Slight cough, no shortness of breath or other URI symptoms.  See HPI  PHYSICAL EXAM: BP 130/62 mmHg  Pulse 64  Temp(Src) 99.6 F (37.6 C) (Tympanic)  Ht 5\' 11"  (1.803 m)  Wt 194 lb (87.998 kg)  BMI 27.07 kg/m2 Pleasant male, in no distress. By the end of the visit, he was visibly shaking some when sitting at rest HEENT: conjunctiva and sclera clear.  OP clear, moist mucus membranes Neck: No lymphadenopathy or mass Heart: regular rate and rhythm Lungs: clear bilaterally Abdomen: nontender, soft, no mass Back: mild CVA tenderness on the right Extremities: no edema Skin: no rashes/lesions Psych: normal mood, affect Neuro: alert and oriented. Cranial nerves intact.  Normal gait, strength  Urine dip:  3+,, nitrite +, SG 1.020, trace blood  ASSESSMENT/PLAN:  Acute pyelonephritis - Plan: ciprofloxacin (CIPRO) 500 MG tablet, Urine culture  Urinary frequency - Plan: POCT Urinalysis Dipstick  Risks/side effects of prescribed medications were reviewed. Risks of untreated  infection reviewed. Advised that if he isn't responding to oral antibiotics, or if he starts vomiting and can't keep them down, that he might need admission for IV antibiotics.  He expressed understanding, and will be in touch if ongoing fevers, worsening symptoms/pain, nausea/vomiting develops.   Drink plenty of fluids (noncaffeinated). Take the antibiotic twice daily. Continue tylenol as needed for fever/chills. Go to the emergency room if your are vomiting, and unable to take your medication. Call us in 2 days if your fever is continuing/worse, if you aren't tolerating the medication,  any nausea/vomiting, or other concerns.

## 2014-04-01 LAB — URINE CULTURE: Colony Count: >=100000

## 2014-04-26 LAB — HM DIABETES EYE EXAM

## 2014-04-27 ENCOUNTER — Encounter: Payer: Self-pay | Admitting: Internal Medicine

## 2014-07-06 ENCOUNTER — Other Ambulatory Visit: Payer: Self-pay | Admitting: Family Medicine

## 2014-09-05 ENCOUNTER — Other Ambulatory Visit: Payer: Self-pay | Admitting: Family Medicine

## 2014-09-11 ENCOUNTER — Other Ambulatory Visit: Payer: Self-pay | Admitting: Family Medicine

## 2014-09-12 ENCOUNTER — Other Ambulatory Visit: Payer: Self-pay | Admitting: Family Medicine

## 2014-09-12 DIAGNOSIS — R809 Proteinuria, unspecified: Secondary | ICD-10-CM

## 2014-09-12 MED ORDER — LISINOPRIL 5 MG PO TABS: 5.0000 mg | ORAL_TABLET | Freq: Every day | ORAL | Status: DC

## 2014-09-12 NOTE — Telephone Encounter (Signed)
Andre Walker, I got this message from Dr. Johnsie Kindred patient so I refilled his BP medication for # 24 because he ran out.

## 2014-09-12 NOTE — Telephone Encounter (Addendum)
Pt say he spoke to [place] about needing refill on Lisinopril yesterday and they agreed to wait until his 09/18/14 appointment but patient just realized that he took his last pill today so need refill today. Also pt wants to be called if and when script sent to pharmacy

## 2014-09-13 ENCOUNTER — Other Ambulatory Visit: Payer: Medicare Other

## 2014-09-13 DIAGNOSIS — E119 Type 2 diabetes mellitus without complications: Secondary | ICD-10-CM

## 2014-09-13 DIAGNOSIS — E7800 Pure hypercholesterolemia, unspecified: Secondary | ICD-10-CM

## 2014-09-13 LAB — HEMOGLOBIN A1C
Hgb A1c MFr Bld: 6.5 % — ABNORMAL HIGH (ref <5.7)
Mean Plasma Glucose: 140 mg/dL — ABNORMAL HIGH (ref <117)

## 2014-09-13 LAB — COMPREHENSIVE METABOLIC PANEL
ALK PHOS: 68 U/L (ref 39–117)
ALT: 18 U/L (ref 0–53)
AST: 16 U/L (ref 0–37)
Albumin: 3.8 g/dL (ref 3.5–5.2)
BILIRUBIN TOTAL: 0.8 mg/dL (ref 0.2–1.2)
BUN: 23 mg/dL (ref 6–23)
CO2: 24 mEq/L (ref 19–32)
Calcium: 9.2 mg/dL (ref 8.4–10.5)
Chloride: 103 mEq/L (ref 96–112)
Creat: 0.94 mg/dL (ref 0.50–1.35)
GLUCOSE: 120 mg/dL — AB (ref 70–99)
Potassium: 4.3 mEq/L (ref 3.5–5.3)
SODIUM: 138 meq/L (ref 135–145)
TOTAL PROTEIN: 6.6 g/dL (ref 6.0–8.3)

## 2014-09-13 LAB — LIPID PANEL
CHOL/HDL RATIO: 3.1 ratio
Cholesterol: 119 mg/dL (ref 0–200)
HDL: 39 mg/dL — ABNORMAL LOW (ref >=40)
LDL Cholesterol: 65 mg/dL (ref 0–99)
TRIGLYCERIDES: 75 mg/dL (ref <150)
VLDL: 15 mg/dL (ref 0–40)

## 2014-09-13 LAB — TSH: TSH: 4.144 u[IU]/mL (ref 0.350–4.500)

## 2014-09-18 ENCOUNTER — Ambulatory Visit (INDEPENDENT_AMBULATORY_CARE_PROVIDER_SITE_OTHER): Payer: Medicare Other | Admitting: Family Medicine

## 2014-09-18 ENCOUNTER — Encounter: Payer: Self-pay | Admitting: Family Medicine

## 2014-09-18 VITALS — BP 110/62 | HR 64 | Ht 70.5 in | Wt 195.0 lb

## 2014-09-18 DIAGNOSIS — Z0000 Encounter for general adult medical examination without abnormal findings: Secondary | ICD-10-CM

## 2014-09-18 DIAGNOSIS — D692 Other nonthrombocytopenic purpura: Secondary | ICD-10-CM | POA: Insufficient documentation

## 2014-09-18 DIAGNOSIS — E7800 Pure hypercholesterolemia, unspecified: Secondary | ICD-10-CM

## 2014-09-18 DIAGNOSIS — Z1211 Encounter for screening for malignant neoplasm of colon: Secondary | ICD-10-CM

## 2014-09-18 DIAGNOSIS — N400 Enlarged prostate without lower urinary tract symptoms: Secondary | ICD-10-CM

## 2014-09-18 DIAGNOSIS — E119 Type 2 diabetes mellitus without complications: Secondary | ICD-10-CM | POA: Diagnosis not present

## 2014-09-18 DIAGNOSIS — Z125 Encounter for screening for malignant neoplasm of prostate: Secondary | ICD-10-CM

## 2014-09-18 LAB — HEMOCCULT GUIAC POC 1CARD (OFFICE): Fecal Occult Blood, POC: NEGATIVE

## 2014-09-18 MED ORDER — METFORMIN HCL ER 500 MG PO TB24: ORAL_TABLET | ORAL | Status: DC

## 2014-09-18 MED ORDER — TERAZOSIN HCL 5 MG PO CAPS: ORAL_CAPSULE | ORAL | Status: DC

## 2014-09-18 MED ORDER — SIMVASTATIN 20 MG PO TABS: ORAL_TABLET | ORAL | Status: DC

## 2014-09-18 NOTE — Patient Instructions (Addendum)
  HEALTH MAINTENANCE RECOMMENDATIONS:  It is recommended that you get at least 30 minutes of aerobic exercise at least 5 days/week (for weight loss, you may need as much as 60-90 minutes). This can be any activity that gets your heart rate up. This can be divided in 10-15 minute intervals if needed, but try and build up your endurance at least once a week.  Weight bearing exercise is also recommended twice weekly.  Eat a healthy diet with lots of vegetables, fruits and fiber.  "Colorful" foods have a lot of vitamins (ie green vegetables, tomatoes, red peppers, etc).  Limit sweet tea, regular sodas and alcoholic beverages, all of which has a lot of calories and sugar.  Up to 2 alcoholic drinks daily may be beneficial for men (unless trying to lose weight, watch sugars).  Drink a lot of water.  Sunscreen of at least SPF 30 should be used on all sun-exposed parts of the skin when outside between the hours of 10 am and 4 pm (not just when at beach or pool, but even with exercise, golf, tennis, and yard work!)  Use a sunscreen that says "broad spectrum" so it covers both UVA and UVB rays, and make sure to reapply every 1-2 hours.  Remember to change the batteries in your smoke detectors when changing your clock times in the spring and fall.  Use your seat belt every time you are in a car, and please drive safely and not be distracted with cell phones and texting while driving.    Mr. Schunk , Thank you for taking time to come for your Medicare Wellness Visit. I appreciate your ongoing commitment to your health goals. Please review the following plan we discussed and let me know if I can assist you in the future.   These are the goals we discussed: Goals    None      This is a list of the screening recommended for you and due dates:  Health Maintenance  Topic Date Due  . Urine Protein Check  09/13/2014  . Flu Shot  09/25/2014  . Hemoglobin A1C  03/16/2015  . Complete foot exam   03/23/2015  . Eye  exam for diabetics  04/26/2015  . Colon Cancer Screening  01/12/2020  . Tetanus Vaccine  12/01/2020  . Shingles Vaccine  Completed  . Pneumonia vaccines  Completed   Urine protein check was done at today's visit (to be done yearly). Get high dose flu shot in the Fall (September/October) The above colon cancer screening date is likely incorrect--you have a history of polyps, and often times is done every 5 years.  As we discussed, you should discuss with Dr. Watt Climes next year (2017) your screening options, which may include Cologard rather than colonoscopy, given your age (that is the DNA test done on the stool).    Please bring [place] copies of your Living Will and [place],

## 2014-09-18 NOTE — Progress Notes (Signed)
Chief Complaint  Patient presents with  . Med check plus    nonfasting, had labs done already. No concerns.    Andre Walker is a 81 y.o. male who presents for annual wellness visit and follow-up on chronic medical conditions.  Diabetes follow-up: Blood sugars at home are running 105-130's.  It was 115 this morning, 105 a few days ago. Denies hypoglycemia. Denies polydipsia and polyuria. Last eye exam was 04/2014. Patient follows a low sugar diet and checks feet regularly without concerns. Denies numbness, tingling, burning or sores.  Hyperlipidemia follow-up: Patient is reportedly following a low-fat, low cholesterol diet. Compliant with medications and denies medication side effects. He had some aches when originally changed from Zocor to the generic, but coenzyme Q10 resolved those symptoms. He has been on this regimen now for a few years, and denies any problems or side effects.  Going to the [facility] 3 times/week or exercises at the gym where he lives,and walks 30 minutes daily when weather permits. He usually plays golf once or twice weekly.  BPH: He has been on Hytrin for years without side effects. He gets up 0-1 times per night. Denies hesitancy or weakened stream. Bladder empties well. He was noted to have elevated PSA velocity--noted to have 0.9 increase in PSA between 2014 and 2015.recheck 6 months later was better. Lab Results  Component Value Date   PSA 2.25 03/22/2014   PSA 2.89 09/12/2013   PSA 1.96 06/02/2012   ED: [facility] for [facility]. Dose is 100mg , and often 1/2 tablet is effective.He plans to continue to see [place] yearly         Immunization History  Administered Date(s) Administered  . Influenza Split 12/02/2010, 12/03/2011  . Influenza, High Dose Seasonal PF 12/01/2012, 12/12/2013  . Pneumococcal Conjugate-13 09/12/2013  . Pneumococcal Polysaccharide-23 12/25/2004, 06/04/2011  . Td 04/24/2004  . Tdap 12/02/2010  . Zoster  10/23/2007   Last colonoscopy: 10/2010 Last PSA: 02/2014 Dentist: twice yearly Ophtho: yearly, now in March Exercise:  [employer] 3x/week, golf and walks daily  Other doctors caring for patient include: Dr. Hassell Done (general surgeon, did melanoma excision, no regular f/u)  Dr. Allyson Sabal (dermatologist) --sees every 6 months Dr. Katy Fitch (ophtho) --yearly in January Dr. Lavone Neri (dentist) --twice yearly Dr. Watt Climes (GI)  Dr. Hassell Done (surgeon who did melanoma excisions--no longer sees regularly)   Depression screen:  See screen in epic. Negative ADL screen:  See screen in epic.  Notable only for some hearing loss Fall screen: negative.  End of Life Discussion:  Patient has a living will and medical power of attorney  Past Medical History  Diagnosis Date  . Diabetes mellitus 2004  . BPH (benign prostatic hyperplasia)     controlled on Hytrin  . ED (erectile dysfunction)   . Colon polyps     Dr. Watt Climes  . Microalbuminuria     on ACEI for microalbuminuria (does NOT have HTN)  . Pure hypercholesterolemia 2006  . Actinic keratosis     Dr. Allyson Sabal  . Traumatic partial tear of biceps tendon 01/2009    "popeye" injury  . Hearing loss     high frequency  . Hiatal hernia     small, noted on EGD 10/2010  . Wears glasses   . Wears partial dentures     lower  . Melanoma     right arm    Past Surgical History  Procedure Laterality Date  . Total knee arthroplasty Right  2012  . Appendectomy  1955  . Inguinal hernia repair Right 1989  . Total knee arthroplasty Left 2005  . Colonoscopy  11/09, 10/29/10    Dr. Watt Climes  . Esophagogastroduodenoscopy  10/29/10    Dr. Watt Climes  . Melanoma excision  10/08/10    R upper arm, Dr. Hassell Done  . Melanoma excision  03/16/2012    Procedure: MELANOMA EXCISION;  Surgeon: Pedro Earls, MD;  Location: [facility];  Service: General;  Laterality: Right;  excision of melanoma of right arm    History   Social History  . Marital Status: Married    Spouse  Name: N/A  . Number of Children: 2  . Years of Education: N/A   Occupational History  . Retired (from post office)    Social History Main Topics  . Smoking status: Never Smoker   . Smokeless tobacco: Never Used  . Alcohol Use: 0.0 oz/week    2-3 Glasses of wine per week     Comment: 1 glass of wine 1-2 times per month.  . Drug Use: No  . Sexual Activity: Not on file   Other Topics Concern  . Not on file   Social History Narrative   1 son in [place], 1 son in [place].  Still does some handyman work (very little now).  Moved to [place] 09/2012    Family History  Problem Relation Age of Onset  . Diabetes Sister   . Diabetes Brother   . Heart disease Brother   . Hyperlipidemia Brother   . Hypertension Brother   . Stroke Brother   . Diabetes Sister   . Cancer Paternal Uncle   . Heart disease Paternal Uncle     Outpatient Encounter Prescriptions as of 09/18/2014  Medication Sig Note  . aspirin 81 MG tablet Take 81 mg by mouth daily.     . Calcium Carbonate-Vitamin D (CALCIUM 600+D HIGH POTENCY) 600-400 MG-UNIT per tablet Take 2 tablets by mouth daily.     . Coenzyme Q10 (COQ10) 100 MG CAPS Take 1 capsule by mouth daily.     Marland Kitchen glucosamine-chondroitin 500-400 MG tablet Take 2 tablets by mouth daily.    Marland Kitchen lisinopril (PRINIVIL,ZESTRIL) 5 MG tablet Take 1 tablet (5 mg total) by mouth daily. TAKE ONE TABLET BY MOUTH EVERY DAY   . metFORMIN (GLUCOPHAGE-XR) 500 MG 24 hr tablet TAKE 2 TABLETS (1,000 MG TOTAL) BY MOUTH DAILY WITH BREAKFAST.   . Multiple Vitamins-Minerals (MULTIVITAMIN WITH MINERALS) tablet Take 1 tablet by mouth daily.     . Omega-3 Fatty Acids (FISH OIL) 1360 MG CAPS Take 1 capsule by mouth daily.   . simvastatin (ZOCOR) 20 MG tablet TAKE 1 TABLET (20 MG TOTAL) BY MOUTH AT BEDTIME.   Marland Kitchen terazosin (HYTRIN) 5 MG capsule TAKE ONE CAPSULE BY MOUTH AT BEDTIME   . [DISCONTINUED] acetaminophen (TYLENOL) 325 MG tablet Take 650 mg by mouth every 6 (six) hours  as needed. 03/29/2014: Last dose 3am this morning.   . [DISCONTINUED] ciprofloxacin (CIPRO) 500 MG tablet Take 1 tablet (500 mg total) by mouth 2 (two) times daily.    No facility-administered encounter medications on file as of 09/18/2014.    Allergies  Allergen Reactions  . Penicillins Rash   ROS: The patient denies anorexia, fever, weight changes, headaches, vision loss, ear pain, hoarseness, chest pain, palpitations, dizziness, syncope, dyspnea on exertion, cough, swelling, nausea, vomiting, diarrhea, constipation, abdominal pain, melena, hematochezia, indigestion/heartburn, hematuria, incontinence, nocturia (just once a night),  weakened urine stream, dysuria, genital lesions, joint pains, numbness, tingling, weakness, tremor, suspicious skin lesions (sees derm), depression, anxiety, abnormal bleeding/bruising (easily bruises if he bumps his arms), or enlarged lymph nodes.  +ED.  Some high frequency hearing loss, and "selective hearing" per wife. No other hearing problems noted. Denies memory concerns  PHYSICAL EXAM:  BP 110/62 mmHg  Pulse 64  Ht 5' 10.5" (1.791 m)  Wt 195 lb (88.451 kg)  BMI 27.57 kg/m2  General Appearance:  Alert, cooperative, no distress, appears stated age   Head:  Normocephalic, without obvious abnormality, atraumatic   Eyes:  PERRL, conjunctiva/corneas clear, EOM's intact, fundi  benign   Ears:  Normal TM's and external ear canals   Nose:  Nares normal, mucosa normal, no drainage or sinus tenderness   Throat:  Lips, mucosa, and tongue normal; teeth and gums normal   Neck:  Supple, no lymphadenopathy; thyroid: no enlargement/tenderness/nodules; no carotid  bruit or JVD   Back:  Spine nontender, no curvature, ROM normal, no CVA tenderness. Scoliosis and prominence/kyphosis of the lower lumbar spine.  nontender.  Lungs:  Clear to auscultation bilaterally without wheezes, rales or ronchi; respirations unlabored   Chest Wall:  No tenderness or  deformity   Heart:  Regular rate and rhythm, S1 and S2 normal, no murmur, rub  or gallop Occasional skipped beat.  Breast Exam:  No chest wall tenderness, masses or gynecomastia   Abdomen:  Soft, non-tender, nondistended, normoactive bowel sounds,  no masses, no hepatosplenomegaly   Genitalia:  Normal male external genitalia without lesions. Testicles without masses. No inguinal hernias.   Rectal:  Normal sphincter tone, no masses or tenderness; guaiac negative stool. Prostate smooth, no nodules, mildly enlarged on R lobe>L (unchanged from prior exam). No nodules   Extremities:  No clubbing, cyanosis or edema   Pulses:  2+ and symmetric all extremities   Skin:  Skin color, texture, turgor normal, no rashes. Senile purpura on his arms.  Many AK's on forearms bilaterally, and on LE's. Onychomycotic great toenails. Decreased monofilament sensation to left heel   Lymph nodes:  Cervical, supraclavicular, and axillary nodes normal   Neurologic:  CNII-XII intact, normal strength, sensation and gait; reflexes 2+ and symmetric throughout    Psych: Normal mood, affect, hygiene and grooming.        Diabetic foot exam done.     Chemistry      Component Value Date/Time   NA 138 09/13/2014 0001   K 4.3 09/13/2014 0001   CL 103 09/13/2014 0001   CO2 24 09/13/2014 0001   BUN 23 09/13/2014 0001   CREATININE 0.94 09/13/2014 0001   CREATININE 1.05 03/11/2012 1200      Component Value Date/Time   CALCIUM 9.2 09/13/2014 0001   ALKPHOS 68 09/13/2014 0001   AST 16 09/13/2014 0001   ALT 18 09/13/2014 0001   BILITOT 0.8 09/13/2014 0001     Fasting glucose 120  Lab Results  Component Value Date   HGBA1C 6.5* 09/13/2014   Lab Results  Component Value Date   CHOL 119 09/13/2014   HDL 39* 09/13/2014   LDLCALC 65 09/13/2014   TRIG 75 09/13/2014   CHOLHDL 3.1 09/13/2014   Lab Results  Component Value Date   TSH 4.144 09/13/2014     ASSESSMENT/PLAN:  Medicare annual wellness visit, subsequent  Pure hypercholesterolemia - at goal on current regimen - Plan: simvastatin (ZOCOR) 20 MG tablet  Type 2 diabetes mellitus, controlled - controlled; h/o microalbuminuria, resolved on ACEI  due for check; continue diet, exercise, meds - Plan: Microalbumin / creatinine urine ratio, metFORMIN (GLUCOPHAGE-XR) 500 MG 24 hr tablet  Senile purpura  BPH (benign prostatic hyperplasia) - controlled - Plan: terazosin (HYTRIN) 5 MG capsule   Discussed PSA screening (risks/benefits), recommended at least 30 minutes of aerobic activity at least 5 days/week; proper sunscreen use reviewed; healthy diet and alcohol recommendations (less than or equal to 2 drinks/day) reviewed; regular seatbelt use; changing batteries in smoke detectors. Immunization recommendations discussed--UTD, high dose flu shot in the fall. Colonoscopy recommendations reviewed, UTD. Discussed whether or not he might need another (to discuss with Dr. Juventino Slovak not due until 2017; briefly discussed options, including Cologard)  PSA due in 6 months (1 year from the last).  He likely will go to the [place] in January and have labs done there--he will bring copies to his visit. Will have drawn here if not done there.  Borderline TSH. Discussed s/sx of hypothyroidism--to let us know prior to next labs if having any symptoms.  If not, check just once yearly (sooner only if symptoms).  AWV: MOST form reviewed and updated. Full Code, Full Care.  Medicare Attestation I have personally reviewed: The patient's medical and social history Their use of alcohol, tobacco or illicit drugs Their current medications and supplements The patient's functional ability including ADLs,fall risks, home safety risks, cognitive, and hearing and visual impairment Diet and physical activities Evidence for depression or mood disorders  The patient's weight, height, BMI, and visual acuity have been  recorded in the chart.  I have made referrals, counseling, and provided education to the patient based on review of the above and I have provided the patient with a written personalized care plan for preventive services.     Adrienne Trombetta A, MD   09/18/2014

## 2014-09-19 LAB — MICROALBUMIN / CREATININE URINE RATIO
Creatinine, Urine: 127.8 mg/dL
MICROALB UR: 1.2 mg/dL (ref <2.0)
MICROALB/CREAT RATIO: 9.4 mg/g (ref 0.0–30.0)

## 2014-12-06 ENCOUNTER — Other Ambulatory Visit (INDEPENDENT_AMBULATORY_CARE_PROVIDER_SITE_OTHER): Payer: Medicare Other

## 2014-12-06 DIAGNOSIS — Z23 Encounter for immunization: Secondary | ICD-10-CM

## 2014-12-08 ENCOUNTER — Other Ambulatory Visit: Payer: Self-pay | Admitting: Family Medicine

## 2014-12-11 ENCOUNTER — Telehealth: Payer: Self-pay | Admitting: Family Medicine

## 2014-12-11 MED ORDER — LISINOPRIL 5 MG PO TABS: 5.0000 mg | ORAL_TABLET | Freq: Every day | ORAL | Status: DC

## 2014-12-11 NOTE — Telephone Encounter (Signed)
RF request from [facility] for Lisinopril 5mg  #90

## 2015-02-14 LAB — MICROALBUMIN, URINE: Microalb, Ur: 3.38

## 2015-03-04 ENCOUNTER — Other Ambulatory Visit: Payer: Self-pay | Admitting: Family Medicine

## 2015-04-04 ENCOUNTER — Ambulatory Visit (INDEPENDENT_AMBULATORY_CARE_PROVIDER_SITE_OTHER): Payer: Medicare Other | Admitting: Family Medicine

## 2015-04-04 ENCOUNTER — Encounter: Payer: Self-pay | Admitting: *Deleted

## 2015-04-04 ENCOUNTER — Encounter: Payer: Self-pay | Admitting: Family Medicine

## 2015-04-04 VITALS — BP 120/68 | HR 60 | Ht 70.5 in | Wt 193.8 lb

## 2015-04-04 DIAGNOSIS — E7800 Pure hypercholesterolemia, unspecified: Secondary | ICD-10-CM | POA: Diagnosis not present

## 2015-04-04 DIAGNOSIS — Z5181 Encounter for therapeutic drug level monitoring: Secondary | ICD-10-CM

## 2015-04-04 DIAGNOSIS — Z125 Encounter for screening for malignant neoplasm of prostate: Secondary | ICD-10-CM

## 2015-04-04 DIAGNOSIS — E119 Type 2 diabetes mellitus without complications: Secondary | ICD-10-CM | POA: Diagnosis not present

## 2015-04-04 LAB — HEPATIC FUNCTION PANEL
ALK PHOS: 65 U/L (ref 40–115)
ALT: 18 U/L (ref 9–46)
AST: 16 U/L (ref 10–35)
Albumin: 3.9 g/dL (ref 3.6–5.1)
BILIRUBIN DIRECT: 0.2 mg/dL (ref <=0.2)
BILIRUBIN INDIRECT: 0.5 mg/dL (ref 0.2–1.2)
BILIRUBIN TOTAL: 0.7 mg/dL (ref 0.2–1.2)
Total Protein: 6.5 g/dL (ref 6.1–8.1)

## 2015-04-04 LAB — POCT GLYCOSYLATED HEMOGLOBIN (HGB A1C): Hemoglobin A1C: 6.4

## 2015-04-04 MED ORDER — METFORMIN HCL ER 500 MG PO TB24: ORAL_TABLET | ORAL | Status: DC

## 2015-04-04 NOTE — Patient Instructions (Signed)
Continue your current medications. Your diabetes and cholesterol are well controlled. Continue your regular exercise and healthy diet.

## 2015-04-04 NOTE — Progress Notes (Signed)
Chief Complaint  Patient presents with  . Diabetes    fasting med check.    Patient brings in labs done at the [place] on 12/20.  A1c 6.4%. Chol 121, TG 70, HDL 53, LDL 54. Urine microalb/Cr ratio 21.53.  CBC WBC 9.61, Hg 13.3, plt 254.  b-met normal with fasting glucose 126, Cr 1.06  Diabetes follow-up: Blood sugars at home are running 100-130. It was 119 this morning, 114 yesterday. Denies hypoglycemia. Denies polydipsia and polyuria. Last eye exam was 04/2014, scheduled for 04/28/15. Patient follows a low sugar diet and checks feet regularly without concerns.Just had diabetic foot exam done at the [place].  Denies numbness, tingling, burning or sores.  Hyperlipidemia follow-up: Patient is reportedly following a low-fat, low cholesterol diet. Compliant with medications and denies medication side effects. He had some aches when originally changed from Zocor to the generic, but coenzyme Q10 resolved those symptoms. He has been on this regimen now for a few years, and denies any problems or side effects.  Going to the gym at [facility] at least 3 days per week, and walks daily. He usually plays golf once or twice weekly.  BPH: He has been on Hytrin for years without side effects. He gets up 0-1 times per night. Denies hesitancy or weakened stream. Bladder empties well. He was noted to have elevated PSA velocity--noted to have 0.9 increase in PSA between 2014 and 2015.recheck 6 months later was better. Last PSA was 2.25 one year ago.  PMH, PSH, SH reviewed  Outpatient Encounter Prescriptions as of 04/04/2015  Medication Sig  . aspirin 81 MG tablet Take 81 mg by mouth daily.    . Calcium Carbonate-Vitamin D (CALCIUM 600+D HIGH POTENCY) 600-400 MG-UNIT per tablet Take 2 tablets by mouth daily.    . Coenzyme Q10 (COQ10) 100 MG CAPS Take 1 capsule by mouth daily.    Marland Kitchen glucosamine-chondroitin 500-400 MG tablet Take 2 tablets by mouth daily.   Marland Kitchen lisinopril (PRINIVIL,ZESTRIL) 5 MG tablet Take 1  tablet (5 mg total) by mouth daily.  . metFORMIN (GLUCOPHAGE-XR) 500 MG 24 hr tablet TAKE TWO TABLETS BY MOUTH ONCE DAILY WITH BREAKFAST  . Multiple Vitamins-Minerals (MULTIVITAMIN WITH MINERALS) tablet Take 1 tablet by mouth daily.    . Omega-3 Fatty Acids (FISH OIL) 1360 MG CAPS Take 1 capsule by mouth daily.  . simvastatin (ZOCOR) 20 MG tablet TAKE 1 TABLET (20 MG TOTAL) BY MOUTH AT BEDTIME.  Marland Kitchen terazosin (HYTRIN) 5 MG capsule TAKE ONE CAPSULE BY MOUTH AT BEDTIME  . [DISCONTINUED] metFORMIN (GLUCOPHAGE-XR) 500 MG 24 hr tablet TAKE TWO TABLETS BY MOUTH ONCE DAILY WITH BREAKFAST   No facility-administered encounter medications on file as of 04/04/2015.   Allergies  Allergen Reactions  . Penicillins Rash   ROS: no fever, chills, headaches, dizziness, URI symptoms, allergies, chest pain, palpitations, nausea, vomiting, abdominal pain, bowel changes, edema, bleeding, bruising, numbness, tingling, rashes, depression, anxiety, joint pains or any other complaints. He notes a bulging at his umbilicus that sometimes is noticeable through his shirt. Denies any pain/discomfort.  Full ROS negative.  PHYSICAL EXAM: BP 120/68 mmHg  Pulse 60  Ht 5' 10.5" (1.791 m)  Wt 193 lb 12.8 oz (87.907 kg)  BMI 27.41 kg/m2  Well developed, pleasant male in no distress  Neck: no lymphadenopathy, thyromegaly or carotid bruit  Heart: regular rate and rhythm without murmur  Lungs: clear bilaterally  Abdomen: soft, nontender, no organomegaly; Small, reducible, nontender umbilical hernia Extremities: no edema, 2+ pulse  Skin: Extensive actinic damage to both forearms. No rashes/lesions Psych: normal mood, affect, hygiene and grooming  Neuro: alert and oriented, normal gait, strength, sensation.   Lab Results  Component Value Date   HGBA1C 6.4 04/04/2015   ASSESSMENT/PLAN:  Diabetes mellitus without complication ([place]) - well controlled on current regimen - Plan: HgB A1c, metFORMIN (GLUCOPHAGE-XR) 500 MG  24 hr tablet, Comprehensive metabolic panel, TSH, Hemoglobin A1c, Microalbumin / creatinine urine ratio  Pure hypercholesterolemia - at goal per labs from [place] in December - Plan: Lipid panel  Controlled type 2 diabetes mellitus without complication, without long-term current use of insulin (HCC)  Medication monitoring encounter - Plan: Hepatic function panel, Comprehensive metabolic panel, Lipid panel  Screening for prostate cancer - risks/benefits of PSA screening discussed; pt would like screening - Plan: PSA, Medicare    PSA, LFT's. today Other labs done by [facility] in December (A1c repeated today because these labs not given to [place] until after it was already done in the office).  Discussed risks and benefits of PSA screening--did not encourage this, given his age, but he would like to have it checked.  Risks of overtreatment reviewed.   Return in 6 months for CPE/AWV/med check; fasting labs prior. Orders entered.

## 2015-04-05 LAB — PSA, MEDICARE: PSA: 3.16 ng/mL (ref <=4.00)

## 2015-04-11 ENCOUNTER — Encounter: Payer: Self-pay | Admitting: Internal Medicine

## 2015-05-01 LAB — HM DIABETES EYE EXAM

## 2015-06-02 ENCOUNTER — Other Ambulatory Visit: Payer: Self-pay | Admitting: Family Medicine

## 2015-06-06 ENCOUNTER — Ambulatory Visit (INDEPENDENT_AMBULATORY_CARE_PROVIDER_SITE_OTHER): Payer: Medicare Other | Admitting: Family Medicine

## 2015-06-06 ENCOUNTER — Encounter: Payer: Self-pay | Admitting: Family Medicine

## 2015-06-06 VITALS — BP 118/60 | HR 80 | Temp 97.9°F | Ht 70.5 in | Wt 196.8 lb

## 2015-06-06 DIAGNOSIS — R059 Cough, unspecified: Secondary | ICD-10-CM

## 2015-06-06 DIAGNOSIS — R05 Cough: Secondary | ICD-10-CM

## 2015-06-06 MED ORDER — AZITHROMYCIN 250 MG PO TABS: ORAL_TABLET | ORAL | Status: DC

## 2015-06-06 MED ORDER — BENZONATATE 200 MG PO CAPS: 200.0000 mg | ORAL_CAPSULE | Freq: Three times a day (TID) | ORAL | Status: DC | PRN

## 2015-06-06 NOTE — Progress Notes (Signed)
Chief Complaint  Patient presents with  . Cough    for over a week. No other symptoms. Getting just a little bit of green mucus up. No fevers. Has tried mucinex and robitussin DM and has not helped. Going on 14 day trip next Monday. Has also been taking allergy pill twice daily (not sure of name).   Started out as a tickle in his throat, then developed cough.  Has been coughing for a week. Cough is mostly dry, only rarely gets up a small amount of phlegm, slightly green.  Denies runny nose, sore throat, sinus pressure, ear pain, headaches.  No fever, chills.  No sick contacts.  Cough is worse at night. Denies heartburn, reflux, nausea. He has been taking mucinex-DM and tried robitussion prior to that, doesn't notice much help. Using some sort of allergy medication that is twice daily, doesn't seem to be helping.  Had cataract surgery last month.  PMH, PSH, SH reviewed and updated.  Allergies  Allergen Reactions  . Penicillins Rash   Outpatient Encounter Prescriptions as of 06/06/2015  Medication Sig  . aspirin 81 MG tablet Take 81 mg by mouth daily.    . Calcium Carbonate-Vitamin D (CALCIUM 600+D HIGH POTENCY) 600-400 MG-UNIT per tablet Take 2 tablets by mouth daily.    . Coenzyme Q10 (COQ10) 100 MG CAPS Take 1 capsule by mouth daily.    Marland Kitchen dextromethorphan-guaiFENesin (MUCINEX DM) 30-600 MG 12hr tablet Take 1 tablet by mouth 2 (two) times daily.  Marland Kitchen glucosamine-chondroitin 500-400 MG tablet Take 2 tablets by mouth daily.   Marland Kitchen guaifenesin (ROBITUSSIN) 100 MG/5ML syrup Take 200 mg by mouth 3 (three) times daily as needed for cough.  Marland Kitchen lisinopril (PRINIVIL,ZESTRIL) 5 MG tablet TAKE ONE TABLET BY MOUTH ONCE DAILY  . metFORMIN (GLUCOPHAGE-XR) 500 MG 24 hr tablet TAKE TWO TABLETS BY MOUTH ONCE DAILY WITH BREAKFAST  . Multiple Vitamins-Minerals (MULTIVITAMIN WITH MINERALS) tablet Take 1 tablet by mouth daily.    . Omega-3 Fatty Acids (FISH OIL) 1360 MG CAPS Take 1 capsule by mouth daily.  .  simvastatin (ZOCOR) 20 MG tablet TAKE 1 TABLET (20 MG TOTAL) BY MOUTH AT BEDTIME.  Marland Kitchen terazosin (HYTRIN) 5 MG capsule TAKE ONE CAPSULE BY MOUTH AT BEDTIME   No facility-administered encounter medications on file as of 06/06/2015.   ROS: see HPI. No nausea, vomiting, bowel changes, rashes, dizziness, headaches, chest pain or other concerns  PHYSICAL EXAM: BP 118/60 mmHg  Pulse 80  Temp(Src) 97.9 F (36.6 C) (Tympanic)  Ht 5' 10.5" (1.791 m)  Wt 196 lb 12.8 oz (89.268 kg)  BMI 27.83 kg/m2  Well developed, pleasant male in no distress. Speaking easily.  Occasional cough, dry HEENT: PERRL, EOMI, conjunctiva and sclera are clear. TM's and EAc's normal. Nasal mucosa is mildly edematous, pale, no purulence. OP is clear. Sinuses nontender Neck: no lymphadenopathy or mass Heart: regular rate and rhythm without murmur Lungs: clear bilaterally.  Fair air movement. No wheezes, rales, ronchi Skin: AK's and some bruising on forearms. Normal turgor, no rash Neuro: alert and oriented, cranial nerves intact. Normal strength, gait   ASSESSMENT/PLAN:  Cough - Plan: benzonatate (TESSALON) 200 MG capsule, azithromycin (ZITHROMAX) 250 MG tablet  Allergies vs URI No evidence of bacterial infection at this time.   Cough--I suspect your cough is related to either allergies or a cold, with postnasal drainage.  I don't see evidence of pneumonia, bronchitis or a sinus infection at this time. Continue to drink plenty of fluids. Continue the Mucinex (  plain or DM). If using the plain Mucinex (not the DM) and if the benzonatate/tessalon isn't helping with your cough, you can get a separate Delsym syrup to use twice daily, if needed (just at bedtime if that's when your cough mainly bothers you). Don't use both robitussin and mucinex together (have the same ingredients). Continue to use daily allergy medication (as directed on box).  Use the tessalon as needed for cough. Try and sleep with the head of the bed  elevated some. Start the antibiotic if you develop fever, increasing discolored phlegm, or if cough worsens/persists beyond 10 days and clearly getting worse rather than better.

## 2015-06-06 NOTE — Patient Instructions (Signed)
  Cough--I suspect your cough is related to either allergies or a cold, with postnasal drainage.  I don't see evidence of pneumonia, bronchitis or a sinus infection at this time. Continue to drink plenty of fluids. Continue the Mucinex (plain or DM). If using the plain Mucinex (not the DM) and if the benzonatate/tessalon isn't helping with your cough, you can get a separate Delsym syrup to use twice daily, if needed (just at bedtime if that's when your cough mainly bothers you). Don't use both robitussin and mucinex together (have the same ingredients). Continue to use daily allergy medication (as directed on box).  Use the tessalon as needed for cough. Try and sleep with the head of the bed elevated some. Start the antibiotic if you develop fever, increasing discolored phlegm, or if cough worsens/persists beyond 10 days and clearly getting worse rather than better.

## 2015-09-19 ENCOUNTER — Other Ambulatory Visit: Payer: Self-pay | Admitting: Family Medicine

## 2015-09-19 DIAGNOSIS — E119 Type 2 diabetes mellitus without complications: Secondary | ICD-10-CM

## 2015-10-01 ENCOUNTER — Other Ambulatory Visit: Payer: Self-pay | Admitting: Family Medicine

## 2015-10-01 DIAGNOSIS — E7800 Pure hypercholesterolemia, unspecified: Secondary | ICD-10-CM

## 2015-10-03 ENCOUNTER — Other Ambulatory Visit: Payer: Self-pay | Admitting: Family Medicine

## 2015-10-03 DIAGNOSIS — E7800 Pure hypercholesterolemia, unspecified: Secondary | ICD-10-CM

## 2015-10-03 NOTE — Telephone Encounter (Signed)
Has an appt 9/17 for cpe

## 2015-10-30 ENCOUNTER — Other Ambulatory Visit: Payer: Self-pay | Admitting: Family Medicine

## 2015-10-30 DIAGNOSIS — N400 Enlarged prostate without lower urinary tract symptoms: Secondary | ICD-10-CM

## 2015-10-30 NOTE — Telephone Encounter (Signed)
Pt has an appt this month for cpe

## 2015-11-05 ENCOUNTER — Other Ambulatory Visit: Payer: Self-pay | Admitting: Family Medicine

## 2015-11-05 DIAGNOSIS — E7800 Pure hypercholesterolemia, unspecified: Secondary | ICD-10-CM

## 2015-11-07 ENCOUNTER — Other Ambulatory Visit: Payer: Medicare Other

## 2015-11-07 DIAGNOSIS — E7800 Pure hypercholesterolemia, unspecified: Secondary | ICD-10-CM

## 2015-11-07 DIAGNOSIS — Z5181 Encounter for therapeutic drug level monitoring: Secondary | ICD-10-CM

## 2015-11-07 DIAGNOSIS — E119 Type 2 diabetes mellitus without complications: Secondary | ICD-10-CM

## 2015-11-07 LAB — LIPID PANEL
Cholesterol: 120 mg/dL — ABNORMAL LOW (ref 125–200)
HDL: 44 mg/dL (ref >=40)
LDL CALC: 61 mg/dL (ref <130)
Total CHOL/HDL Ratio: 2.7 Ratio (ref <=5.0)
Triglycerides: 75 mg/dL (ref <150)
VLDL: 15 mg/dL (ref <30)

## 2015-11-07 LAB — COMPREHENSIVE METABOLIC PANEL
ALBUMIN: 4 g/dL (ref 3.6–5.1)
ALT: 17 U/L (ref 9–46)
AST: 17 U/L (ref 10–35)
Alkaline Phosphatase: 65 U/L (ref 40–115)
BILIRUBIN TOTAL: 0.6 mg/dL (ref 0.2–1.2)
BUN: 24 mg/dL (ref 7–25)
CO2: 23 mmol/L (ref 20–31)
CREATININE: 1.06 mg/dL (ref 0.70–1.11)
Calcium: 9.3 mg/dL (ref 8.6–10.3)
Chloride: 105 mmol/L (ref 98–110)
GLUCOSE: 112 mg/dL — AB (ref 65–99)
Potassium: 4.3 mmol/L (ref 3.5–5.3)
SODIUM: 136 mmol/L (ref 135–146)
Total Protein: 6.7 g/dL (ref 6.1–8.1)

## 2015-11-07 LAB — TSH: TSH: 5.13 mIU/L — ABNORMAL HIGH (ref 0.40–4.50)

## 2015-11-08 LAB — MICROALBUMIN / CREATININE URINE RATIO
CREATININE, URINE: 92 mg/dL (ref 20–370)
Microalb Creat Ratio: 11 mcg/mg creat (ref <30)
Microalb, Ur: 1 mg/dL

## 2015-11-08 LAB — HEMOGLOBIN A1C
Hgb A1c MFr Bld: 6.1 % — ABNORMAL HIGH (ref <5.7)
MEAN PLASMA GLUCOSE: 128 mg/dL

## 2015-11-11 NOTE — Progress Notes (Signed)
Chief Complaint  Patient presents with  . Medicare Wellness    nonfasting awv/med check. No concerns. Wants all meds refilled for 90 days supply wiyth refills to [facility].    Andre Walker is a 82 y.o. male who presents for annual Medicare wellness visit and follow-up on chronic medical conditions.  He has the following concerns:  Diabetes follow-up: Blood sugars at home are running 105-135, mostly around 120's.  Denies hypoglycemia. Denies polydipsia and polyuria. Last eye exam was 04/28/15. Patient follows a low sugar diet and checks feet regularly without concerns.  Denies numbness, tingling, burning or sores. He stubbed his left 2nd toe recently, and noticed that the nail turned black.  He denies any pain.  Hyperlipidemia follow-up: Patient is reportedly following a low-fat, low cholesterol diet. Compliant with medications and denies medication side effects. He had some aches when originally changed from Zocor to the generic, but coenzyme Q10 resolved those symptoms. He has been on this regimen now for a few years, and denies any problems or side effects.  BPH: He has been on Hytrin for years without side effects. He gets up 0-1 times per night. Denies hesitancy or weakened stream. Bladder empties well. He was noted to have elevated PSA velocity--noted to have 0.9 increase in PSA between 2014 and 2015.recheck 6 months later was better. Last PSA was 3.16 in February of this year.  ED: [facility] tablets for $[facility]. He stopped using it because it wasn't effective. He hasn't taken it in a year. He hasn't tried any of the other ED medications, and isn't really interested.  He has had some borderline TSH values in the past, slightly higher with recent labs (see below).  He denies fatigue, weight gain, cold intolerance, bowel changes, hair/skin changes.  Immunization History  Administered Date(s) Administered  . Influenza  Split 12/02/2010, 12/03/2011  . Influenza, High Dose Seasonal PF 12/01/2012, 12/12/2013, 12/06/2014  . Pneumococcal Conjugate-13 09/12/2013  . Pneumococcal Polysaccharide-23 12/25/2004, 06/04/2011  . Td 04/24/2004  . Tdap 12/02/2010  . Zoster 10/23/2007   Last colonoscopy: 10/2010 Last PSA: Lab Results  Component Value Date   PSA 3.16 04/04/2015   PSA 2.25 03/22/2014   PSA 2.89 09/12/2013   Dentist: twice yearly Ophtho: yearly, now in March Exercise:  Going to the gym (classes and machines) at [employer] at least 3 days per week, and walks daily. He usually plays golf once or twice weekly.   Other doctors caring for patient include: Dr. Hassell Done (general surgeon, did melanoma excision, no regular f/u)  Dr. Allyson Sabal (dermatologist) --sees every 6 months Dr. Katy Fitch (ophtho) --yearly in March Dr. Lavone Neri (dentist) --twice yearly Dr. Watt Climes (GI)  [facility]  Depression screen:  See screen in epic. Negative ADL screen:  See screen in epic. Negative--he reports some mild hearing loss Fall screen: negative.  End of Life Discussion:  Patient has a living will and medical power of attorney  Past Medical History:  Diagnosis Date  . Actinic keratosis    Dr. Allyson Sabal  . BPH (benign prostatic hyperplasia)    controlled on Hytrin  . Colon polyps    Dr. Watt Climes  . Diabetes mellitus 2004  . ED (erectile dysfunction)   . Hearing loss    high frequency  . Hiatal hernia    small, noted on EGD 10/2010  . Melanoma ([place])    right arm  . Microalbuminuria    on ACEI for  microalbuminuria (does NOT have HTN)  . Pure hypercholesterolemia 2006  . Traumatic partial tear of biceps tendon 01/2009   "popeye" injury on right  . Wears glasses   . Wears partial dentures    lower    Past Surgical History:  Procedure Laterality Date  . APPENDECTOMY  1955  . CATARACT EXTRACTION Bilateral R 05/07/15, L 05/21/15   Dr. Katy Fitch  . COLONOSCOPY  11/09, 10/29/10   Dr. Watt Climes  .  ESOPHAGOGASTRODUODENOSCOPY  10/29/10   Dr. Watt Climes  . INGUINAL HERNIA REPAIR Right 1989  . MELANOMA EXCISION  10/08/10   R upper arm, Dr. Hassell Done  . MELANOMA EXCISION  03/16/2012   Procedure: MELANOMA EXCISION;  Surgeon: Pedro Earls, MD;  Location: [facility];  Service: General;  Laterality: Right;  excision of melanoma of right arm  . TOTAL KNEE ARTHROPLASTY Right 2012  . TOTAL KNEE ARTHROPLASTY Left 2005    Social History   Social History  . Marital status: Married    Spouse name: N/A  . Number of children: 2  . Years of education: N/A   Occupational History  . Retired (from post office)    Social History Main Topics  . Smoking status: Never Smoker  . Smokeless tobacco: Never Used  . Alcohol use 0.0 oz/week    2 - 3 Glasses of wine per week     Comment: 1 glass of wine 1-2 times per month.  . Drug use: No  . Sexual activity: Not on file   Other Topics Concern  . Not on file   Social History Narrative   1 son in [place], 1 son in [place].  Still does some handyman work (very little now).  Moved to [place] 09/2012    Family History  Problem Relation Age of Onset  . Diabetes Sister   . Diabetes Brother   . Heart disease Brother   . Hyperlipidemia Brother   . Hypertension Brother   . Stroke Brother   . Diabetes Sister   . Cancer Paternal Uncle   . Heart disease Paternal Uncle     Outpatient Encounter Prescriptions as of 11/13/2015  Medication Sig  . aspirin 81 MG tablet Take 81 mg by mouth daily.    . Calcium Carbonate-Vitamin D (CALCIUM 600+D HIGH POTENCY) 600-400 MG-UNIT per tablet Take 1 tablet by mouth 2 (two) times daily.   . Coenzyme Q10 (COQ10) 100 MG CAPS Take 1 capsule by mouth daily.    Marland Kitchen glucosamine-chondroitin 500-400 MG tablet Take 2 tablets by mouth daily.   Marland Kitchen lisinopril (PRINIVIL,ZESTRIL) 5 MG tablet TAKE ONE TABLET BY MOUTH ONCE DAILY  . metFORMIN (GLUCOPHAGE-XR) 500 MG 24 hr tablet TAKE TWO TABLETS BY MOUTH ONCE  DAILY WITH BREAKFAST  . Multiple Vitamins-Minerals (MULTIVITAMIN WITH MINERALS) tablet Take 1 tablet by mouth daily.    . Omega-3 Fatty Acids (FISH OIL) 1360 MG CAPS Take 1 capsule by mouth daily.  . simvastatin (ZOCOR) 20 MG tablet TAKE ONE TABLET BY MOUTH ONCE DAILY AT BEDTIME  . terazosin (HYTRIN) 5 MG capsule TAKE ONE CAPSULE BY MOUTH AT BEDTIME  . [DISCONTINUED] guaifenesin (ROBITUSSIN) 100 MG/5ML syrup Take 200 mg by mouth 3 (three) times daily as needed for cough.  . [DISCONTINUED] azithromycin (ZITHROMAX) 250 MG tablet Take 2 tablets by mouth on first day, then 1 tablet by mouth on days 2 through 5  . [DISCONTINUED] benzonatate (TESSALON) 200 MG capsule Take 1 capsule (200 mg total) by mouth 3 (three) times  daily as needed.  . [DISCONTINUED] dextromethorphan-guaiFENesin (MUCINEX DM) 30-600 MG 12hr tablet Take 1 tablet by mouth 2 (two) times daily.   No facility-administered encounter medications on file as of 11/13/2015.     Allergies  Allergen Reactions  . Penicillins Rash    ROS: The patient denies anorexia, fever, weight changes, headaches, vision loss, ear pain, hoarseness, chest pain, palpitations, dizziness, syncope, dyspnea on exertion, cough, swelling, nausea, vomiting, diarrhea, constipation, abdominal pain, melena, hematochezia, indigestion/heartburn, hematuria, incontinence, nocturia (just once a night), weakened urine stream, dysuria, genital lesions, joint pains (just slight pain in his left thumb periodically), numbness, tingling, weakness, tremor, suspicious skin lesions (sees derm), depression, anxiety, abnormal bleeding/bruising (easily bruises if he bumps his arms), or enlarged lymph nodes.  +ED.  Some high frequency hearing loss, and "selective hearing" per wife. No other hearing problems noted. Denies memory concerns   PHYSICAL EXAM:  BP 116/62 (BP Location: Left Arm, Patient Position: Sitting, Cuff Size: Normal)   Pulse 60   Ht 5' 11"  (1.803 m)   Wt 195 lb  6.4 oz (88.6 kg)   BMI 27.25 kg/m   Patient brought in his monitor to have it verified. BP 127/66 P 57 per his machine (see nurse's values above)  General Appearance:  Alert, cooperative, no distress, appears stated age   Head:  Normocephalic, without obvious abnormality, atraumatic   Eyes:  PERRL, conjunctiva/corneas clear, EOM's intact, fundi not well visualized  Ears:  Normal TM's and external ear canals   Nose:  Nares normal, mucosa normal, no drainage or sinus tenderness   Throat:  Lips, mucosa, and tongue normal; teeth and gums normal   Neck:  Supple, no lymphadenopathy; thyroid: no enlargement/tenderness/nodules; no carotid  bruit or JVD   Back:  Spine nontender, no curvature, ROM normal, no CVA tenderness. Scoliosis and prominence/kyphosis of the lower lumbar spine. nontender.  Lungs:  Clear to auscultation bilaterally without wheezes, rales or ronchi; respirations unlabored   Chest Wall:  No tenderness or deformity   Heart:  Regular rhythm, mild bradycardia (58-60); S1 and S2 normal, no murmur, rub or gallop.  Breast Exam:  No chest wall tenderness, masses or gynecomastia   Abdomen:  Soft, non-tender, nondistended, normoactive bowel sounds, no masses, no hepatosplenomegaly   Genitalia:  Normal male external genitalia without lesions. Testicles without masses. No inguinal hernias.   Rectal:  Normal sphincter tone, no masses or tenderness; guaiac negative stool. Prostate smooth, no nodules, mildly enlarged on R lobe>L (unchanged from prior exam). No nodules   Extremities:  No clubbing, cyanosis or edema   Pulses:  2+ and symmetric all extremities   Skin:  Skin color, texture, turgor normal, no rashes. Senile purpura on his arms.  Many AK's on forearms bilaterally, and on LE's. Onychomycotic great toenails; left 2nd toenail is black from recent injury, nontender. Normal monofilament sensation (somewhat inconsistent--took many trials and only felt some  on R 3rd toe)   Lymph nodes:  Cervical, supraclavicular, and axillary nodes normal   Neurologic:  CNII-XII intact, normal strength, sensation and gait; reflexes 2+ and symmetric throughout   Psych: Normal mood, affect, hygiene and grooming  Diabetic foot exam performed.   Lab Results  Component Value Date   HGBA1C 6.1 (H) 11/07/2015     Chemistry      Component Value Date/Time   NA 136 11/07/2015 0728   K 4.3 11/07/2015 0728   CL 105 11/07/2015 0728   CO2 23 11/07/2015 0728   BUN 24 11/07/2015 0728  CREATININE 1.06 11/07/2015 0728      Component Value Date/Time   CALCIUM 9.3 11/07/2015 0728   ALKPHOS 65 11/07/2015 0728   AST 17 11/07/2015 0728   ALT 17 11/07/2015 0728   BILITOT 0.6 11/07/2015 0728     Fasting glucose 112  Lab Results  Component Value Date   TSH 5.13 (H) 11/07/2015   Urine microalbumin/Cr ratio 11  Lab Results  Component Value Date   CHOL 120 (L) 11/07/2015   HDL 44 11/07/2015   LDLCALC 61 11/07/2015   TRIG 75 11/07/2015   CHOLHDL 2.7 11/07/2015    ASSESSMENT/PLAN:   Medicare annual wellness visit, subsequent  Pure hypercholesterolemia - at goal on current regimen - Plan: simvastatin (ZOCOR) 20 MG tablet  BPH (benign prostatic hypertrophy) - controlled on current regimen  Diabetes mellitus without complication ([place]) - well controlled on current regimen - Plan: lisinopril (PRINIVIL,ZESTRIL) 5 MG tablet, metFORMIN (GLUCOPHAGE-XR) 500 MG 24 hr tablet  Elevated TSH - asymptomatic--recheck in 6 months  Senile purpura ([place])  Need for prophylactic vaccination and inoculation against influenza - Plan: Flu vaccine HIGH DOSE PF (Fluzone High dose)   Elevated TSH--asymptomatic, mild.  Recheck TSH in 6 months.  Discussed PSA screening (risks/benefits), recommended at least 30 minutes of aerobic activity at least 5 days/week; weight bearing exercise at least 2x/week; proper sunscreen use reviewed; healthy diet and alcohol  recommendations (less than or equal to 2 drinks/day) reviewed; regular seatbelt use; changing batteries in smoke detectors. Immunization recommendations discussed--UTD, high dose flu shot given today. Colonoscopy recommendations reviewed, I don't have records of last colonoscopy (just the EGD)--not sure if there were polyps or not. He is to check with Dr. Watt Climes to see if due for colonoscopy.  If it is felt he doesn't need one, then I would recommend Cologard.   AWV: MOST form reviewed and updated. Full Code, Full Care.  Refills--Hytrin just done earlier this month, all other meds refilled today  F/u 6 mos with fasting labs prior TSH, PSA, c-met, glu, LFT, CBC  Medicare Attestation I have personally reviewed: The patient's medical and social history Their use of alcohol, tobacco or illicit drugs Their current medications and supplements The patient's functional ability including ADLs,fall risks, home safety risks, cognitive, and hearing and visual impairment Diet and physical activities Evidence for depression or mood disorders  The patient's weight, height, and BMI have been recorded in the chart.  I have made referrals, counseling, and provided education to the patient based on review of the above and I have provided the patient with a written personalized care plan for preventive services.     Ridley Dileo A, MD   11/11/2015

## 2015-11-13 ENCOUNTER — Ambulatory Visit (INDEPENDENT_AMBULATORY_CARE_PROVIDER_SITE_OTHER): Payer: Medicare Other | Admitting: Family Medicine

## 2015-11-13 ENCOUNTER — Encounter: Payer: Self-pay | Admitting: Family Medicine

## 2015-11-13 VITALS — BP 116/62 | HR 60 | Ht 71.0 in | Wt 195.4 lb

## 2015-11-13 DIAGNOSIS — N400 Enlarged prostate without lower urinary tract symptoms: Secondary | ICD-10-CM

## 2015-11-13 DIAGNOSIS — E7800 Pure hypercholesterolemia, unspecified: Secondary | ICD-10-CM | POA: Diagnosis not present

## 2015-11-13 DIAGNOSIS — Z0000 Encounter for general adult medical examination without abnormal findings: Secondary | ICD-10-CM

## 2015-11-13 DIAGNOSIS — R7989 Other specified abnormal findings of blood chemistry: Secondary | ICD-10-CM

## 2015-11-13 DIAGNOSIS — Z5181 Encounter for therapeutic drug level monitoring: Secondary | ICD-10-CM

## 2015-11-13 DIAGNOSIS — E119 Type 2 diabetes mellitus without complications: Secondary | ICD-10-CM | POA: Diagnosis not present

## 2015-11-13 DIAGNOSIS — Z125 Encounter for screening for malignant neoplasm of prostate: Secondary | ICD-10-CM | POA: Diagnosis not present

## 2015-11-13 DIAGNOSIS — D692 Other nonthrombocytopenic purpura: Secondary | ICD-10-CM

## 2015-11-13 DIAGNOSIS — R946 Abnormal results of thyroid function studies: Secondary | ICD-10-CM

## 2015-11-13 DIAGNOSIS — Z23 Encounter for immunization: Secondary | ICD-10-CM | POA: Diagnosis not present

## 2015-11-13 MED ORDER — SIMVASTATIN 20 MG PO TABS: 20.0000 mg | ORAL_TABLET | Freq: Every day | ORAL | 3 refills | Status: DC

## 2015-11-13 MED ORDER — LISINOPRIL 5 MG PO TABS: 5.0000 mg | ORAL_TABLET | Freq: Every day | ORAL | 1 refills | Status: DC

## 2015-11-13 MED ORDER — METFORMIN HCL ER 500 MG PO TB24: ORAL_TABLET | ORAL | 1 refills | Status: DC

## 2015-11-13 NOTE — Patient Instructions (Signed)
  HEALTH MAINTENANCE RECOMMENDATIONS:  It is recommended that you get at least 30 minutes of aerobic exercise at least 5 days/week (for weight loss, you may need as much as 60-90 minutes). This can be any activity that gets your heart rate up. This can be divided in 10-15 minute intervals if needed, but try and build up your endurance at least once a week.  Weight bearing exercise is also recommended twice weekly.  Eat a healthy diet with lots of vegetables, fruits and fiber.  "Colorful" foods have a lot of vitamins (ie green vegetables, tomatoes, red peppers, etc).  Limit sweet tea, regular sodas and alcoholic beverages, all of which has a lot of calories and sugar.  Up to 2 alcoholic drinks daily may be beneficial for men (unless trying to lose weight, watch sugars).  Drink a lot of water.  Sunscreen of at least SPF 30 should be used on all sun-exposed parts of the skin when outside between the hours of 10 am and 4 pm (not just when at beach or pool, but even with exercise, golf, tennis, and yard work!)  Use a sunscreen that says "broad spectrum" so it covers both UVA and UVB rays, and make sure to reapply every 1-2 hours.  Remember to change the batteries in your smoke detectors when changing your clock times in the spring and fall.  Use your seat belt every time you are in a car, and please drive safely and not be distracted with cell phones and texting while driving.    Andre Walker , Thank you for taking time to come for your Medicare Wellness Visit. I appreciate your ongoing commitment to your health goals. Please review the following plan we discussed and let me know if I can assist you in the future.   These are the goals we discussed: Goals    None      This is a list of the screening recommended for you and due dates:  Health Maintenance  Topic Date Due  . Complete foot exam   09/18/2015  . Flu Shot  09/25/2015  . Eye exam for diabetics  04/30/2016  . Hemoglobin A1C  05/06/2016  .  Tetanus Vaccine  12/01/2020  . Shingles Vaccine  Completed  . Pneumonia vaccines  Completed   Flu shot was given today. Diabetic foot exam was done today.  Please contact Dr. Perley Jain office to see if you are due for colonoscopy.  I have the endoscopy (upper) that was done in 2012, but not the colonoscopy report, so I don't know if you had any polyps at that time.  If you didn't have polyps, you may be a candidate for Cologard (the fecal DNA test, noninvasive, as a screen for colon cancer--if abnormal, then you WOULD need a colonoscopy).  Please discuss colonoscopy vs Cologard with Dr. Watt Climes.

## 2015-12-31 LAB — HM COLONOSCOPY

## 2016-01-14 ENCOUNTER — Encounter: Payer: Self-pay | Admitting: *Deleted

## 2016-01-14 DIAGNOSIS — C44722 Squamous cell carcinoma of skin of right lower limb, including hip: Secondary | ICD-10-CM | POA: Insufficient documentation

## 2016-01-24 ENCOUNTER — Encounter: Payer: Self-pay | Admitting: Family Medicine

## 2016-01-31 ENCOUNTER — Other Ambulatory Visit: Payer: Self-pay | Admitting: Family Medicine

## 2016-01-31 ENCOUNTER — Encounter: Payer: Self-pay | Admitting: Family Medicine

## 2016-01-31 DIAGNOSIS — N400 Enlarged prostate without lower urinary tract symptoms: Secondary | ICD-10-CM

## 2016-02-27 ENCOUNTER — Other Ambulatory Visit: Payer: Self-pay | Admitting: Dermatology

## 2016-04-28 ENCOUNTER — Encounter: Payer: Self-pay | Admitting: *Deleted

## 2016-04-29 ENCOUNTER — Other Ambulatory Visit: Payer: Self-pay | Admitting: Family Medicine

## 2016-04-29 DIAGNOSIS — N400 Enlarged prostate without lower urinary tract symptoms: Secondary | ICD-10-CM

## 2016-05-06 ENCOUNTER — Other Ambulatory Visit: Payer: Medicare Other

## 2016-05-06 ENCOUNTER — Telehealth: Payer: Self-pay

## 2016-05-06 DIAGNOSIS — Z125 Encounter for screening for malignant neoplasm of prostate: Secondary | ICD-10-CM

## 2016-05-06 DIAGNOSIS — N400 Enlarged prostate without lower urinary tract symptoms: Secondary | ICD-10-CM

## 2016-05-06 DIAGNOSIS — E119 Type 2 diabetes mellitus without complications: Secondary | ICD-10-CM

## 2016-05-06 DIAGNOSIS — R7989 Other specified abnormal findings of blood chemistry: Secondary | ICD-10-CM

## 2016-05-06 DIAGNOSIS — E7800 Pure hypercholesterolemia, unspecified: Secondary | ICD-10-CM

## 2016-05-06 DIAGNOSIS — Z5181 Encounter for therapeutic drug level monitoring: Secondary | ICD-10-CM

## 2016-05-06 LAB — CBC WITH DIFFERENTIAL/PLATELET
BASOS ABS: 0 {cells}/uL (ref 0–200)
Basophils Relative: 0 %
EOS ABS: 222 {cells}/uL (ref 15–500)
EOS PCT: 3 %
HEMATOCRIT: 40.4 % (ref 38.5–50.0)
HEMOGLOBIN: 13.7 g/dL (ref 13.2–17.1)
LYMPHS ABS: 1924 {cells}/uL (ref 850–3900)
Lymphocytes Relative: 26 %
MCH: 32.6 pg (ref 27.0–33.0)
MCHC: 33.9 g/dL (ref 32.0–36.0)
MCV: 96.2 fL (ref 80.0–100.0)
MONO ABS: 592 {cells}/uL (ref 200–950)
MPV: 9.5 fL (ref 7.5–12.5)
Monocytes Relative: 8 %
NEUTROS PCT: 63 %
Neutro Abs: 4662 cells/uL (ref 1500–7800)
Platelets: 241 10*3/uL (ref 140–400)
RBC: 4.2 MIL/uL (ref 4.20–5.80)
RDW: 13.1 % (ref 11.0–15.0)
WBC: 7.4 10*3/uL (ref 4.0–10.5)

## 2016-05-06 LAB — TSH: TSH: 5.92 mIU/L — ABNORMAL HIGH (ref 0.40–4.50)

## 2016-05-06 LAB — HEPATIC FUNCTION PANEL
ALBUMIN: 4.1 g/dL (ref 3.6–5.1)
ALK PHOS: 70 U/L (ref 40–115)
ALT: 18 U/L (ref 9–46)
AST: 15 U/L (ref 10–35)
BILIRUBIN INDIRECT: 0.5 mg/dL (ref 0.2–1.2)
Bilirubin, Direct: 0.2 mg/dL (ref <=0.2)
TOTAL PROTEIN: 6.5 g/dL (ref 6.1–8.1)
Total Bilirubin: 0.7 mg/dL (ref 0.2–1.2)

## 2016-05-06 LAB — GLUCOSE, RANDOM: Glucose, Bld: 132 mg/dL — ABNORMAL HIGH (ref 65–99)

## 2016-05-06 LAB — PSA: PSA: 2.2 ng/mL (ref <=4.0)

## 2016-05-06 NOTE — Telephone Encounter (Signed)
Pt came in for labs for CPE he left UA do you want sandy to do micro. On it today please advise

## 2016-05-06 NOTE — Telephone Encounter (Signed)
Thank you :)

## 2016-05-06 NOTE — Telephone Encounter (Signed)
Looks like he had urine microalbumin done in September, not needed today. If it upcoming visit is a CPE/AWV, and if he has [place] (I think he does), which pays for physicals, a urine dip can be done, with dx of annual physical and diabetes.  Thanks

## 2016-05-07 LAB — HEMOGLOBIN A1C
Hgb A1c MFr Bld: 6.2 % — ABNORMAL HIGH (ref <5.7)
MEAN PLASMA GLUCOSE: 131 mg/dL

## 2016-05-11 NOTE — Progress Notes (Signed)
Chief Complaint  Patient presents with  . Diabetes    nonfasting med check, labs already done. No concerns.    Complaining of hard stools, straining.  Stools softeners no longer seem to help. Bowels move every other day, hard.  Denies any hemorrhoids or bleeding. Denies any abdominal pain or bloating. Drinks at least 6-8 glasses/d (sips some whenever he walks by the water fountain).  Diabetes follow-up: Blood sugars at home are running 107-150, mostly around 120's.  Denies hypoglycemia. Denies polydipsia and polyuria. Last eye exam was 04/28/15, scheduled for next month. Patient follows a low sugar diet and checks feet regularly without concerns. Denies numbness, tingling, burning or sores.  Hyperlipidemia follow-up: Patient is reportedly following a low-fat, low cholesterol diet. Compliant with medications and denies medication side effects. He had some aches when originally changed from Zocor to the generic, but coenzyme Q10 resolved those symptoms. He has been on this regimen now for a few years, and denies any problems or side effects.  He has had some borderline and mildly elevated TSH values in the past, slightly higher with recent labs (see below).  He denies fatigue, weight gain, cold intolerance, hair/skin changes. Constipation, as discussed above.  BPH: He has been on Hytrin for years without side effects. He gets up 0-1 times per night. Denies hesitancy or weakened stream. Bladder empties well. At one point, he was noted to have elevated PSA velocity--noted to have 0.9 increase in PSA between 2014 and 2015.recheck 6 months later was better. See below.  ED: He previously used Viagra, but it wasn't effective.  Hasn't tried other medications and isn't interested at this time, unless we have samples for him to try. ([facility] tablets for $9.)   PMH, PSH, SH reviewed  Outpatient Encounter Prescriptions as of 05/12/2016  Medication Sig Note  .  aspirin 81 MG tablet Take 81 mg by mouth daily.     . Calcium Carbonate-Vitamin D (CALCIUM 600+D HIGH POTENCY) 600-400 MG-UNIT per tablet Take 1 tablet by mouth 2 (two) times daily.    . Coenzyme Q10 (COQ10) 100 MG CAPS Take 1 capsule by mouth daily.     Marland Kitchen glucosamine-chondroitin 500-400 MG tablet Take 2 tablets by mouth daily.    Marland Kitchen glucose blood test strip 1 each by Other route 2 (two) times daily. Use as instructed 04/28/2016: ONE TOUCH VERIO  . lisinopril (PRINIVIL,ZESTRIL) 5 MG tablet Take 1 tablet (5 mg total) by mouth daily.   . metFORMIN (GLUCOPHAGE-XR) 500 MG 24 hr tablet TAKE TWO TABLETS BY MOUTH ONCE DAILY WITH BREAKFAST   . Multiple Vitamins-Minerals (MULTIVITAMIN WITH MINERALS) tablet Take 1 tablet by mouth daily.     . Omega-3 Fatty Acids (FISH OIL PO) Take 1,600 mg by mouth daily.   Glory Rosebush DELICA LANCETS 40J MISC by Does not apply route.   . simvastatin (ZOCOR) 20 MG tablet Take 1 tablet (20 mg total) by mouth daily with breakfast.   . terazosin (HYTRIN) 5 MG capsule TAKE ONE CAPSULE BY MOUTH AT BEDTIME   . [DISCONTINUED] lisinopril (PRINIVIL,ZESTRIL) 5 MG tablet Take 1 tablet (5 mg total) by mouth daily.   . [DISCONTINUED] metFORMIN (GLUCOPHAGE-XR) 500 MG 24 hr tablet TAKE TWO TABLETS BY MOUTH ONCE DAILY WITH BREAKFAST   . tadalafil (CIALIS) 5 MG tablet Take 1 tablet daily or 2-4 tablets as needed before sexual activity. Effects may last 36 hours   . [DISCONTINUED] Omega-3 Fatty Acids (FISH OIL) 1360 MG  CAPS Take 1 capsule by mouth daily.    No facility-administered encounter medications on file as of 05/12/2016.    Allergies  Allergen Reactions  . Penicillins Rash    ROS: no fever, chills, headaches, dizziness, URI symptoms, allergies, chest pain, palpitations, nausea, vomiting, abdominal pain, bowel changes, edema, bleeding, bruising (has some easy bruising with bumping skin, unchanged), numbness, tingling, rashes, depression, anxiety, joint pains or any other complaints.   Umbilical hernia--unchanged, nontender. Occasional sneeze.   PHYSICAL EXAM:  BP 130/62 (BP Location: Left Arm, Patient Position: Sitting, Cuff Size: Normal)   Pulse 76   Ht 5' 11.75" (1.822 m)   Wt 197 lb 6.4 oz (89.5 kg)   BMI 26.96 kg/m   Wt Readings from Last 3 Encounters:  05/12/16 197 lb 6.4 oz (89.5 kg)  11/13/15 195 lb 6.4 oz (88.6 kg)  06/06/15 196 lb 12.8 oz (89.3 kg)    Well developed, pleasant male in no distress  HEENT: PERRL EOMI, conjunctiva and sclera are clear.  OP clear Neck: no lymphadenopathy, thyromegaly or carotid bruit  Heart: regular rate and rhythm without murmur  Lungs: clear bilaterally  Abdomen: soft, nontender, no organomegaly; Small, reducible, nontender umbilical hernia Back: no CVA tenderness Extremities: no edema, 2+ pulse  Skin: Extensive actinic damage to both forearms. No rashes/lesions Psych: normal mood, affect, hygiene and grooming  Neuro: alert and oriented, normal gait, strength, sensation.   Lab Results  Component Value Date   TSH 5.92 (H) 05/06/2016   Lab Results  Component Value Date   HGBA1C 6.2 (H) 05/06/2016   Fasting glucose 132  Lab Results  Component Value Date   ALT 18 05/06/2016   AST 15 05/06/2016   ALKPHOS 70 05/06/2016   BILITOT 0.7 05/06/2016   Lab Results  Component Value Date   PSA 2.2 05/06/2016   PSA 3.16 04/04/2015   PSA 2.25 03/22/2014   Lab Results  Component Value Date   WBC 7.4 05/06/2016   HGB 13.7 05/06/2016   HCT 40.4 05/06/2016   MCV 96.2 05/06/2016   PLT 241 05/06/2016    ASSESSMENT/PLAN:  Diabetes mellitus without complication ([facility]) - controlled; continue current regimen - Plan: lisinopril (PRINIVIL,ZESTRIL) 5 MG tablet, metFORMIN (GLUCOPHAGE-XR) 500 MG 24 hr tablet  Pure hypercholesterolemia - lipids previously at goal on current regimen, continue  Benign prostatic hyperplasia, unspecified whether lower urinary tract symptoms present - well controlled  Subclinical  hypothyroidism - recent constipation, otherwise no other symptoms; hold off on meds for now.  Reviewed sx, to contact us sooner if sx develop  Erectile dysfunction, unspecified erectile dysfunction type - trial of Cialis samples - Plan: tadalafil (CIALIS) 5 MG tablet  Constipation, unspecified constipation type - counseled re: high fiber diet, fluid intake, OTC meds and laxatives. If not improving, consider treating thyroid   No further PSA testing recommended (he previously expressed desire, so we have been doing); he is agreeable to following my recommendation to stop   Return in 6 months for CPE/AWV/med check; fasting labs prior. c-met, lipids A1c, microalb/Cr, TSH

## 2016-05-12 ENCOUNTER — Encounter: Payer: Self-pay | Admitting: Family Medicine

## 2016-05-12 ENCOUNTER — Ambulatory Visit (INDEPENDENT_AMBULATORY_CARE_PROVIDER_SITE_OTHER): Payer: Medicare Other | Admitting: Family Medicine

## 2016-05-12 VITALS — BP 130/62 | HR 76 | Ht 71.75 in | Wt 197.4 lb

## 2016-05-12 DIAGNOSIS — E7800 Pure hypercholesterolemia, unspecified: Secondary | ICD-10-CM | POA: Diagnosis not present

## 2016-05-12 DIAGNOSIS — E119 Type 2 diabetes mellitus without complications: Secondary | ICD-10-CM | POA: Diagnosis not present

## 2016-05-12 DIAGNOSIS — K5900 Constipation, unspecified: Secondary | ICD-10-CM

## 2016-05-12 DIAGNOSIS — N529 Male erectile dysfunction, unspecified: Secondary | ICD-10-CM

## 2016-05-12 DIAGNOSIS — E039 Hypothyroidism, unspecified: Secondary | ICD-10-CM

## 2016-05-12 DIAGNOSIS — N400 Enlarged prostate without lower urinary tract symptoms: Secondary | ICD-10-CM

## 2016-05-12 DIAGNOSIS — E038 Other specified hypothyroidism: Secondary | ICD-10-CM

## 2016-05-12 MED ORDER — METFORMIN HCL ER 500 MG PO TB24: ORAL_TABLET | ORAL | 1 refills | Status: DC

## 2016-05-12 MED ORDER — LISINOPRIL 5 MG PO TABS: 5.0000 mg | ORAL_TABLET | Freq: Every day | ORAL | 1 refills | Status: DC

## 2016-05-12 MED ORDER — TADALAFIL 5 MG PO TABS: ORAL_TABLET | ORAL | 0 refills | Status: DC

## 2016-05-12 NOTE — Patient Instructions (Addendum)
I recommend getting the new shingles vaccine (Shingrix) when available. You will need to check with your insurance to see if it is covered, and if covered by Medicare Part D, you need to get from the pharmacy rather than our office.  It is a series of 2 injections, spaced 2 months apart. (It is available currently at the pharmacy--we don't have our supply yet).  Continue your current medications.  Continue to get at least 6-8 glasses of water each day. Drink an extra glass for every cup of coffee that you have.  Continue the stool softeners. Try adding a fiber supplement (such as metamucil or Benefiber or Citracel) once or twice daily.  If this does not improve your bowels, then start taking Miralax. Start at 1 capful daily, but decrease if your stools become too loose or too frequent (some people can use 1/2 capful daily, vs taking a full capful just 2-3 times/week--every person is different).  If your constipation is not improving, and if you are also developing other thyroid symptoms (underactive)--such as fatigue, weight gain, changes in hair/skin/nails, moods, etc, please let us know, and we can start a thyroid medication prior to your next visit. Otherwise, we will plan to recheck the thyroid in 6 months.    Constipation, Adult Constipation is when a person has fewer bowel movements in a week than normal, has difficulty having a bowel movement, or has stools that are dry, hard, or larger than normal. Constipation may be caused by an underlying condition. It may become worse with age if a person takes certain medicines and does not take in enough fluids. Follow these instructions at home: Eating and drinking    Eat foods that have a lot of fiber, such as fresh fruits and vegetables, whole grains, and beans.  Limit foods that are high in fat, low in fiber, or overly processed, such as french fries, hamburgers, cookies, candies, and soda.  Drink enough fluid to keep your urine clear or pale  yellow. General instructions   Exercise regularly or as told by your health care provider.  Go to the restroom when you have the urge to go. Do not hold it in.  Take over-the-counter and prescription medicines only as told by your health care provider. These include any fiber supplements.  Practice pelvic floor retraining exercises, such as deep breathing while relaxing the lower abdomen and pelvic floor relaxation during bowel movements.  Watch your condition for any changes.  Keep all follow-up visits as told by your health care provider. This is important. Contact a health care provider if:  You have pain that gets worse.  You have a fever.  You do not have a bowel movement after 4 days.  You vomit.  You are not hungry.  You lose weight.  You are bleeding from the anus.  You have thin, pencil-like stools. Get help right away if:  You have a fever and your symptoms suddenly get worse.  You leak stool or have blood in your stool.  Your abdomen is bloated.  You have severe pain in your abdomen.  You feel dizzy or you faint. This information is not intended to replace advice given to you by your health care provider. Make sure you discuss any questions you have with your health care provider. Document Released: 11/09/2003 Document Revised: 08/31/2015 Document Reviewed: 08/01/2015 Elsevier Interactive Patient Education  2017 [employer].

## 2016-06-18 ENCOUNTER — Ambulatory Visit (INDEPENDENT_AMBULATORY_CARE_PROVIDER_SITE_OTHER): Payer: Medicare Other | Admitting: Family Medicine

## 2016-06-18 ENCOUNTER — Encounter: Payer: Self-pay | Admitting: Family Medicine

## 2016-06-18 VITALS — BP 110/50 | HR 52 | Temp 97.6°F | Resp 20 | Wt 195.8 lb

## 2016-06-18 DIAGNOSIS — J069 Acute upper respiratory infection, unspecified: Secondary | ICD-10-CM | POA: Diagnosis not present

## 2016-06-18 DIAGNOSIS — R05 Cough: Secondary | ICD-10-CM

## 2016-06-18 DIAGNOSIS — R059 Cough, unspecified: Secondary | ICD-10-CM

## 2016-06-18 MED ORDER — BENZONATATE 200 MG PO CAPS: 200.0000 mg | ORAL_CAPSULE | Freq: Three times a day (TID) | ORAL | 0 refills | Status: DC | PRN

## 2016-06-18 NOTE — Patient Instructions (Signed)
  Cough--I suspect your cough is related to either allergies or a cold, with postnasal drainage.  I don't see evidence of pneumonia, bronchitis or a sinus infection at this time. Continue to drink plenty of fluids. Continue the Mucinex (plain or DM) vs robitussin (I want you to have the expectorant guaifenesin in the medication that you are taking). Don't use both robitussin and mucinex together (have the same ingredients). Don't use a DM version of mucinex or robitussin along with Delsym (they both contain the same ingredient).  Continue your daily allergy medication (ie claritin).  Use the tessalon as needed for cough. Try and sleep with the head of the bed elevated some.  Contact us if you develop fever, discolored mucus or other worsening symptoms.

## 2016-06-18 NOTE — Progress Notes (Signed)
Chief Complaint  Patient presents with  . Cough    onset 1.5 weeks ago. He states he had same issues last yr and needed ABX with cough medication. refill Metformin please. reports taking Delsym with some relief.    He is complaining of a frequent dry cough, started about 1.5 weeks ago.  He used Robitussin, as well as Delsym.  It seemed to help some temporarily, cough returns when he doesn't take it.   Cough is worse at night, when laying down, and also in the mornings.  Some nights aren't bad, some nights are worse.  Cough is never productive. Denies any allergy symptoms--no sneezing, eye symptoms, runny nose.  He had a slight sore throat at the beginning, which resolved when the cough started.  Just occasional heartburn, no more than usual.  He just started taking an allergy medication (similar to claritin, from [facility]) two days ago.  He feels like he gets the same cough every year.  Chart was reviewed.  Seen 4/12 with similar symptoms, except that he had also been having some slight greenish phlegm.  He was treated with tessalon perles and given rx for z-pak to use if needed.  He doesn't think that he ever needed to fill the antibiotic.  PMH, PSH, SH reviewed  Current Outpatient Prescriptions on File Prior to Visit  Medication Sig Dispense Refill  . aspirin 81 MG tablet Take 81 mg by mouth daily.      . Calcium Carbonate-Vitamin D (CALCIUM 600+D HIGH POTENCY) 600-400 MG-UNIT per tablet Take 1 tablet by mouth 2 (two) times daily.     . Coenzyme Q10 (COQ10) 100 MG CAPS Take 1 capsule by mouth daily.      Marland Kitchen glucosamine-chondroitin 500-400 MG tablet Take 2 tablets by mouth daily.     Marland Kitchen glucose blood test strip 1 each by Other route 2 (two) times daily. Use as instructed    . lisinopril (PRINIVIL,ZESTRIL) 5 MG tablet Take 1 tablet (5 mg total) by mouth daily. 90 tablet 1  . metFORMIN (GLUCOPHAGE-XR) 500 MG 24 hr tablet TAKE TWO TABLETS BY MOUTH ONCE DAILY WITH BREAKFAST 180 tablet 1  . Multiple  Vitamins-Minerals (MULTIVITAMIN WITH MINERALS) tablet Take 1 tablet by mouth daily.      . Omega-3 Fatty Acids (FISH OIL PO) Take 1,600 mg by mouth daily.    Glory Rosebush DELICA LANCETS 56D MISC by Does not apply route.    . simvastatin (ZOCOR) 20 MG tablet Take 1 tablet (20 mg total) by mouth daily with breakfast. 90 tablet 3  . tadalafil (CIALIS) 5 MG tablet Take 1 tablet daily or 2-4 tablets as needed before sexual activity. Effects may last 36 hours 30 tablet 0  . terazosin (HYTRIN) 5 MG capsule TAKE ONE CAPSULE BY MOUTH AT BEDTIME 90 capsule 0   No current facility-administered medications on file prior to visit.    Allergies  Allergen Reactions  . Penicillins Rash   ROS: No headaches, dizziness, fever, chills, chest pain, shortness of breath, nausea, vomiting, diarrhea, bleeding, bruising, rash.  See HPI  PHYSICAL EXAM:  BP (!) 110/50   Pulse (!) 52   Temp 97.6 F (36.4 C) (Oral)   Resp 20   Wt 195 lb 12.8 oz (88.8 kg)   SpO2 96%   BMI 26.74 kg/m    Well appearing, pleasant, elderly male in no distress.  Occasional dry, hacky cough.  Speaking easily in full sentences HEENT: PERRL, EOMI, conjunctiva and sclera are clear. TM's normal.  Nasal mucosa is only mildly edematous, no erythema or drainage. Sinuses nontender. OP clear Neck: no lymphadenopathy or mass Heart: regular rate and rhythm, no murmur Lungs: clear bilaterally. Good air movement. No wheezes, rales, ronchi Skin: actinic changes on forearms. Normal turgor, no rash Psych: normal mood, affect, hygiene and grooming Neuro: alert and oriented, normal cranial nerves, strength, gait    ASSESSMENT/PLAN  Cough - Plan: benzonatate (TESSALON) 200 MG capsule  Upper respiratory infection, viral  Supportive measures reviewed, as well as s/sx for which he should seek further eval.    Cough--I suspect your cough is related to either allergies or a cold, with postnasal drainage.  I don't see evidence of pneumonia,  bronchitis or a sinus infection at this time. Continue to drink plenty of fluids. Continue the Mucinex (plain or DM) vs robitussin (I want you to have the expectorant guaifenesin in the medication that you are taking). Don't use both robitussin and mucinex together (have the same ingredients). Don't use a DM version of mucinex or robitussin along with Delsym (they both contain the same ingredient).  Continue your daily allergy medication (ie claritin).  Use the tessalon as needed for cough. Try and sleep with the head of the bed elevated some.  Contact us if you develop fever, discolored mucus or other worsening symptoms.

## 2016-07-21 ENCOUNTER — Other Ambulatory Visit: Payer: Self-pay | Admitting: Family Medicine

## 2016-07-21 DIAGNOSIS — N400 Enlarged prostate without lower urinary tract symptoms: Secondary | ICD-10-CM

## 2016-07-22 NOTE — Telephone Encounter (Signed)
Is this okay to refill? 

## 2016-09-10 LAB — HM DIABETES EYE EXAM

## 2016-09-19 ENCOUNTER — Telehealth: Payer: Self-pay | Admitting: Family Medicine

## 2016-09-19 NOTE — Telephone Encounter (Signed)
Received eye exam ordered from [facility]. Sending back for review.

## 2016-09-22 ENCOUNTER — Encounter: Payer: Self-pay | Admitting: *Deleted

## 2016-11-17 ENCOUNTER — Other Ambulatory Visit: Payer: Self-pay | Admitting: Family Medicine

## 2016-11-17 DIAGNOSIS — E119 Type 2 diabetes mellitus without complications: Secondary | ICD-10-CM

## 2016-11-17 DIAGNOSIS — E7800 Pure hypercholesterolemia, unspecified: Secondary | ICD-10-CM

## 2016-11-26 ENCOUNTER — Other Ambulatory Visit (INDEPENDENT_AMBULATORY_CARE_PROVIDER_SITE_OTHER): Payer: Medicare Other

## 2016-11-26 DIAGNOSIS — Z23 Encounter for immunization: Secondary | ICD-10-CM | POA: Diagnosis not present

## 2016-11-26 DIAGNOSIS — E7800 Pure hypercholesterolemia, unspecified: Secondary | ICD-10-CM

## 2016-11-26 DIAGNOSIS — E119 Type 2 diabetes mellitus without complications: Secondary | ICD-10-CM

## 2016-11-26 DIAGNOSIS — E038 Other specified hypothyroidism: Secondary | ICD-10-CM

## 2016-11-26 DIAGNOSIS — E039 Hypothyroidism, unspecified: Secondary | ICD-10-CM

## 2016-11-27 LAB — COMPREHENSIVE METABOLIC PANEL
AG Ratio: 1.7 (calc) (ref 1.0–2.5)
ALBUMIN MSPROF: 4 g/dL (ref 3.6–5.1)
ALT: 18 U/L (ref 9–46)
AST: 15 U/L (ref 10–35)
Alkaline phosphatase (APISO): 94 U/L (ref 40–115)
BUN: 24 mg/dL (ref 7–25)
CO2: 25 mmol/L (ref 20–32)
CREATININE: 1.05 mg/dL (ref 0.70–1.11)
Calcium: 9.2 mg/dL (ref 8.6–10.3)
Chloride: 108 mmol/L (ref 98–110)
Globulin: 2.3 g/dL (calc) (ref 1.9–3.7)
Glucose, Bld: 130 mg/dL — ABNORMAL HIGH (ref 65–99)
POTASSIUM: 4.4 mmol/L (ref 3.5–5.3)
SODIUM: 139 mmol/L (ref 135–146)
TOTAL PROTEIN: 6.3 g/dL (ref 6.1–8.1)
Total Bilirubin: 0.7 mg/dL (ref 0.2–1.2)

## 2016-11-27 LAB — MICROALBUMIN / CREATININE URINE RATIO
Creatinine, Urine: 78 mg/dL (ref 20–320)
MICROALB/CREAT RATIO: 10 ug/mg{creat} (ref <30)
Microalb, Ur: 0.8 mg/dL

## 2016-11-27 LAB — HEMOGLOBIN A1C
Hgb A1c MFr Bld: 6.1 % of total Hgb — ABNORMAL HIGH (ref <5.7)
MEAN PLASMA GLUCOSE: 128 (calc)
eAG (mmol/L): 7.1 (calc)

## 2016-11-27 LAB — LIPID PANEL
Cholesterol: 111 mg/dL (ref <200)
HDL: 37 mg/dL — ABNORMAL LOW (ref >40)
LDL CHOLESTEROL (CALC): 57 mg/dL
Non-HDL Cholesterol (Calc): 74 mg/dL (calc) (ref <130)
TRIGLYCERIDES: 91 mg/dL (ref <150)
Total CHOL/HDL Ratio: 3 (calc) (ref <5.0)

## 2016-11-27 LAB — TSH: TSH: 4.27 m[IU]/L (ref 0.40–4.50)

## 2016-12-02 NOTE — Progress Notes (Signed)
Chief Complaint  Patient presents with  . Medicare Wellness    nonfasting AWV, has labs done already. Only concern is that he would like his ears cleaned out today. Patient states that his FBS was 114 this am.    Andre Walker is a 83 y.o. male who presents for annual wellness visit and follow-up on chronic medical conditions.  He has the following concerns:  He would like his ears cleaned out today. He notices some decreased hearing, especially on the right.  Denies ear pain.  Diabetes follow-up: Blood sugars at home are running 107-130's, average about 125. Denies hypoglycemia. Denies polydipsia and polyuria. Last eye exam was in July, 2018, no retinopathy. Patient follows a low sugar diet and checks feet regularly without concerns. Denies numbness, tingling, burning or sores.  Hyperlipidemia follow-up: Patient is reportedly following a low-fat, low cholesterol diet. Compliant with medications and denies medication side effects. He had some aches when originally changed from Zocor to the generic, but coenzyme Q10 resolved those symptoms. He has been on this regimen now for a few years, and denies any problems or side effects.  He has had some borderline and mildly elevated TSH values in the past, never symptomatic. He denies fatigue, weight gain, cold intolerance, hair/skin changes.   Constipation:  6 months ago he reported having hard stools, straining.He is now taking fiber supplement and eating higher fiber diet.  Only 2x/week it is hard now.  Bowels move daly now.  Denies any hemorrhoidal bleeding.  Drinks at least 6-8 glasses/d (sips some whenever he walks by the water fountain).  BPH: He has been on Hytrin for years without side effects. He gets up 0-1 times per night. Denies hesitancy or weakened stream. Bladder empties well. At one point, he was noted to have elevated PSA velocity--noted to have 0.9 increase in PSA between 2014 and 2015.recheck 6 months later was better. We  had discussed at his last visit no longer screening with PSA.  ED: He previously used Viagra, but it wasn't effective.  Hasn't tried other medications and isn't interested at this time. He is not sexually active currently with his wife, and okay with that.  Immunization History  Administered Date(s) Administered  . Influenza Split 12/02/2010, 12/03/2011  . Influenza, High Dose Seasonal PF 12/01/2012, 12/12/2013, 12/06/2014, 11/13/2015, 11/26/2016  . Pneumococcal Conjugate-13 09/12/2013  . Pneumococcal Polysaccharide-23 12/25/2004, 06/04/2011  . Td 04/24/2004  . Tdap 12/02/2010  . Zoster 10/23/2007   He is on a waiting list for Shingrix at the pharmacy Last colonoscopy: 12/2015, showing int/external hemorrhoids, hyperplastic polyp and diverticulosis Last PSA: Lab Results  Component Value Date   PSA 2.2 05/06/2016   PSA 3.16 04/04/2015   PSA 2.25 03/22/2014   Dentist: twice yearly Ophtho: yearly, last in July Exercise: Going to the gym (classes and machines) at [employer] at least 3 days per week, and walks daily, at least 30 minutes. He usually plays golf once or twice weekly.   Other doctors caring for patient include: Dr. Hassell Done (general surgeon, did melanoma excision, no regular f/u)  Dr. Allyson Sabal (dermatologist) --sees every 6 months Dr. Katy Fitch (ophtho) --yearly in March Dr. Lavone Neri (dentist) --twice yearly Dr. Watt Climes (GI)  [facility]; reports he no longer qualifies due to income  Depression screen: See screen in epic. Negative ADL screen: See screen in epic. Negative--he reports some mild hearing loss (noticed more by his wife) Fall screen: negative.  End of Life Discussion: Patient hasa living  will and medical power of attorney  Past Medical History:  Diagnosis Date  . Actinic keratosis    Dr. Allyson Sabal  . BPH (benign prostatic hyperplasia)    controlled on Hytrin  . Colon polyps    Dr. Watt Climes  . Diabetes mellitus 2004  . ED  (erectile dysfunction)   . Hearing loss    high frequency  . Hiatal hernia    small, noted on EGD 10/2010  . Melanoma ([place])    right arm  . Microalbuminuria    on ACEI for microalbuminuria (does NOT have HTN)  . Pure hypercholesterolemia 2006  . Traumatic partial tear of biceps tendon 01/2009   "popeye" injury on right  . Wears glasses   . Wears partial dentures    lower    Past Surgical History:  Procedure Laterality Date  . APPENDECTOMY  1955  . CATARACT EXTRACTION Bilateral R 05/07/15, L 05/21/15   Dr. Katy Fitch  . COLONOSCOPY  11/09, 10/29/10   Dr. Watt Climes  . ESOPHAGOGASTRODUODENOSCOPY  10/29/10   Dr. Watt Climes  . INGUINAL HERNIA REPAIR Right 1989  . MELANOMA EXCISION  10/08/10   R upper arm, Dr. Hassell Done  . MELANOMA EXCISION  03/16/2012   Procedure: MELANOMA EXCISION;  Surgeon: Pedro Earls, MD;  Location: [facility];  Service: General;  Laterality: Right;  excision of melanoma of right arm  . TOTAL KNEE ARTHROPLASTY Right 2012  . TOTAL KNEE ARTHROPLASTY Left 2005    Social History   Social History  . Marital status: Married    Spouse name: N/A  . Number of children: 2  . Years of education: N/A   Occupational History  . Retired (from post office)    Social History Main Topics  . Smoking status: Never Smoker  . Smokeless tobacco: Never Used  . Alcohol use 0.0 oz/week    2 - 3 Glasses of wine per week     Comment: 1 glass of wine 1-2 times per month.  . Drug use: No  . Sexual activity: Not on file   Other Topics Concern  . Not on file   Social History Narrative   1 son in [place], 1 son in [place].  Still does some handyman work (very little now).  Moved to [place] 09/2012    Family History  Problem Relation Age of Onset  . Diabetes Sister   . Diabetes Brother   . Heart disease Brother   . Hyperlipidemia Brother   . Hypertension Brother   . Stroke Brother   . Diabetes Sister   . Cancer Paternal Uncle   . Heart disease  Paternal Uncle     Outpatient Encounter Prescriptions as of 12/03/2016  Medication Sig Note  . aspirin 81 MG tablet Take 81 mg by mouth daily.     . Calcium Carbonate-Vitamin D (CALCIUM 600+D HIGH POTENCY) 600-400 MG-UNIT per tablet Take 1 tablet by mouth 2 (two) times daily.    . Coenzyme Q10 (COQ10) 100 MG CAPS Take 1 capsule by mouth daily.     Marland Kitchen glucosamine-chondroitin 500-400 MG tablet Take 2 tablets by mouth daily.    Marland Kitchen glucose blood test strip 1 each by Other route 2 (two) times daily. Use as instructed 04/28/2016: ONE TOUCH VERIO  . lisinopril (PRINIVIL,ZESTRIL) 5 MG tablet TAKE 1 TABLET BY MOUTH ONCE DAILY   . metFORMIN (GLUCOPHAGE-XR) 500 MG 24 hr tablet TAKE TWO TABLETS BY MOUTH ONCE DAILY WITH BREAKFAST   . Multiple Vitamins-Minerals (MULTIVITAMIN WITH  MINERALS) tablet Take 1 tablet by mouth daily.     . Omega-3 Fatty Acids (FISH OIL PO) Take 1,600 mg by mouth daily.   Glory Rosebush DELICA LANCETS 71I MISC by Does not apply route.   . simvastatin (ZOCOR) 20 MG tablet TAKE ONE TABLET BY MOUTH ONCE DAILY WITH BREAKFAST   . terazosin (HYTRIN) 5 MG capsule TAKE 1 CAPSULE BY MOUTH AT BEDTIME   . [DISCONTINUED] benzonatate (TESSALON) 200 MG capsule Take 1 capsule (200 mg total) by mouth 3 (three) times daily as needed.   . [DISCONTINUED] tadalafil (CIALIS) 5 MG tablet Take 1 tablet daily or 2-4 tablets as needed before sexual activity. Effects may last 36 hours    No facility-administered encounter medications on file as of 12/03/2016.     Allergies  Allergen Reactions  . Penicillins Rash    ROS: The patient denies anorexia, fever, weight changes, headaches, vision loss, ear pain, hoarseness, chest pain, palpitations, dizziness, syncope, dyspnea on exertion, cough, swelling, nausea, vomiting, diarrhea, constipation, abdominal pain, melena, hematochezia, indigestion/heartburn, hematuria, incontinence, nocturia (just once a night), weakened urine stream, dysuria, genital lesions, numbness,  tingling, weakness, tremor, suspicious skin lesions (sees derm), depression, anxiety, abnormal bleeding/bruising (easily bruises if he bumps his arms), or enlarged lymph nodes.  +ED.  Rare heartburn, about once a month (spicy, spaghetti). Some high frequency hearing loss, TV is loud.  Denies memory concerns. Mild arthritis in his thumbs.   PHYSICAL EXAM:  BP 120/60 (BP Location: Left Arm, Patient Position: Sitting, Cuff Size: Normal)   Pulse 60   Ht 5\' 11"  (1.803 m)   Wt 194 lb 9.6 oz (88.3 kg)   BMI 27.14 kg/m   Wt Readings from Last 3 Encounters:  12/03/16 194 lb 9.6 oz (88.3 kg)  06/18/16 195 lb 12.8 oz (88.8 kg)  05/12/16 197 lb 6.4 oz (89.5 kg)    General Appearance:  Alert, cooperative, no distress, appears stated age   Head:  Normocephalic, without obvious abnormality, atraumatic   Eyes:  PERRL, conjunctiva/corneas clear, EOM's intact, fundi normal  Ears:  Cerumen impaction R>L. EAC's otherwise normal. After ear lavage, TM's noted to be normal bilaterally  Nose:  Nares normal, mucosa normal, no drainage or sinus tenderness   Throat:  Lips, mucosa, and tongue normal; teeth and gums normal   Neck:  Supple, no lymphadenopathy; thyroid: no enlargement/tenderness/nodules; no carotid bruit or JVD   Back:  Spine nontender, no curvature, ROM normal, no CVA tenderness. Scoliosis and prominence/kyphosis of the lower lumbar spine. nontender.  Lungs:  Clear to auscultation bilaterally without wheezes, rales or ronchi; respirations unlabored   Chest Wall:  No tenderness or deformity   Heart:  Regular rate and rhythm, S1 and S2 normal, no murmur, rub or gallop.  Breast Exam:  No chest wall tenderness, masses or gynecomastia   Abdomen:  Soft, non-tender, nondistended, normoactive bowel sounds, no masses, no hepatosplenomegaly. Small, soft, easily reducible umbilical hernia  Genitalia:  Normal male external genitalia without lesions. Testicles without masses. No  inguinal hernias.   Rectal:  Normal sphincter tone, no masses or tenderness; guaiac negative stool. Prostate smooth, no nodules, mildly enlarged (right is a little larger than left, unchanged from prior exam). No nodules   Extremities:  No clubbing, cyanosis or edema   Pulses:  2+ and symmetric all extremities   Skin:  Skin color, texture, turgor normal, no rashes. Senile purpura on his arms. Many AK's on forearms and legs bilaterally. Onychomycotic 1st and 5th toenails bilaterally.  Normal monofilament sensation  Lymph nodes:  Cervical, supraclavicular, and axillary nodes normal   Neurologic:  CNII-XII intact, normal strength, sensation and gait; reflexes 2+ and symmetric throughout   Psych:  Normal mood, affect, hygiene and grooming  Diabetic foot exam performed.   Lab Results  Component Value Date   HGBA1C 6.1 (H) 11/26/2016     Chemistry      Component Value Date/Time   NA 139 11/26/2016 0745   K 4.4 11/26/2016 0745   CL 108 11/26/2016 0745   CO2 25 11/26/2016 0745   BUN 24 11/26/2016 0745   CREATININE 1.05 11/26/2016 0745      Component Value Date/Time   CALCIUM 9.2 11/26/2016 0745   ALKPHOS 70 05/06/2016 0846   AST 15 11/26/2016 0745   ALT 18 11/26/2016 0745   BILITOT 0.7 11/26/2016 0745     Fasting glucose 130 Microalbumin/Cr ratio 10  Lab Results  Component Value Date   TSH 4.27 11/26/2016   Lab Results  Component Value Date   CHOL 111 11/26/2016   HDL 37 (L) 11/26/2016   LDLCALC 61 11/07/2015   TRIG 91 11/26/2016   CHOLHDL 3.0 11/26/2016     ASSESSMENT/PLAN:  Medicare annual wellness visit, subsequent  Diabetes mellitus without complication ([place]) - controlled - Plan: metFORMIN (GLUCOPHAGE-XR) 500 MG 24 hr tablet  Type 2 diabetes mellitus with hypercholesterolemia ([place])  Pure hypercholesterolemia - at goal  Benign prostatic hyperplasia, unspecified whether lower urinary tract symptoms present - stable on  hytrin  Erectile dysfunction, unspecified erectile dysfunction type - no longer desires any treatment  Constipation, unspecified constipation type - resolved with high fiber diet and fiber supplement  Senile purpura ([place])  Medication monitoring encounter  Immunization due - Plan: Pneumococcal polysaccharide vaccine 23-valent greater than or equal to 2yo subcutaneous/IM  Diabetes mellitus without complication ([place]) - controlled; continue current regimen - Plan: metFORMIN (GLUCOPHAGE-XR) 500 MG 24 hr tablet  Bilateral impacted cerumen - ear canal was soaked and lavaged, but required curette by MD to remove the wax. He tolerated well, and TM and EAC normal   Actinic keratosis - many, on upper and lower extremities; scheduled for f/u with his derm soon   Recommended at least 30 minutes of aerobic activity at least 5 days/week; weight bearing exercise at least 2x/week; proper sunscreen use reviewed; healthy diet and alcohol recommendations (less than or equal to 2 drinks/day) reviewed; regular seatbelt use; changing batteries in smoke detectors. Immunization recommendations discussed--pneumovax booster today, due to DM. Shingrix recommended, and he is on the waiting list to get from the pharmacy. Colonoscopy recommendations reviewed, UTD.   AWV: MOST form reviewed and updated. Full Code, Full Care.   F/u 6 mos with fasting labs prior A1c, glu, LFT's, CBC    Medicare Attestation I have personally reviewed: The patient's medical and social history Their use of alcohol, tobacco or illicit drugs Their current medications and supplements The patient's functional ability including ADLs,fall risks, home safety risks, cognitive, and hearing and visual impairment Diet and physical activities Evidence for depression or mood disorders  The patient's weight, height, and BMI have been recorded in the chart.  I have made referrals, counseling, and provided education to the patient based on  review of the above and I have provided the patient with a written personalized care plan for preventive services.

## 2016-12-03 ENCOUNTER — Encounter: Payer: Self-pay | Admitting: Family Medicine

## 2016-12-03 ENCOUNTER — Ambulatory Visit (INDEPENDENT_AMBULATORY_CARE_PROVIDER_SITE_OTHER): Payer: Medicare Other | Admitting: Family Medicine

## 2016-12-03 VITALS — BP 120/60 | HR 60 | Ht 71.0 in | Wt 194.6 lb

## 2016-12-03 DIAGNOSIS — K5900 Constipation, unspecified: Secondary | ICD-10-CM | POA: Diagnosis not present

## 2016-12-03 DIAGNOSIS — L570 Actinic keratosis: Secondary | ICD-10-CM | POA: Diagnosis not present

## 2016-12-03 DIAGNOSIS — E119 Type 2 diabetes mellitus without complications: Secondary | ICD-10-CM | POA: Diagnosis not present

## 2016-12-03 DIAGNOSIS — Z23 Encounter for immunization: Secondary | ICD-10-CM

## 2016-12-03 DIAGNOSIS — Z0000 Encounter for general adult medical examination without abnormal findings: Secondary | ICD-10-CM | POA: Diagnosis not present

## 2016-12-03 DIAGNOSIS — N400 Enlarged prostate without lower urinary tract symptoms: Secondary | ICD-10-CM

## 2016-12-03 DIAGNOSIS — E1169 Type 2 diabetes mellitus with other specified complication: Secondary | ICD-10-CM | POA: Diagnosis not present

## 2016-12-03 DIAGNOSIS — Z5181 Encounter for therapeutic drug level monitoring: Secondary | ICD-10-CM | POA: Diagnosis not present

## 2016-12-03 DIAGNOSIS — E7800 Pure hypercholesterolemia, unspecified: Secondary | ICD-10-CM

## 2016-12-03 DIAGNOSIS — N529 Male erectile dysfunction, unspecified: Secondary | ICD-10-CM

## 2016-12-03 DIAGNOSIS — D692 Other nonthrombocytopenic purpura: Secondary | ICD-10-CM

## 2016-12-03 DIAGNOSIS — H6123 Impacted cerumen, bilateral: Secondary | ICD-10-CM

## 2016-12-03 MED ORDER — METFORMIN HCL ER 500 MG PO TB24: ORAL_TABLET | ORAL | 1 refills | Status: DC

## 2016-12-03 NOTE — Patient Instructions (Signed)
  HEALTH MAINTENANCE RECOMMENDATIONS:  It is recommended that you get at least 30 minutes of aerobic exercise at least 5 days/week (for weight loss, you may need as much as 60-90 minutes). This can be any activity that gets your heart rate up. This can be divided in 10-15 minute intervals if needed, but try and build up your endurance at least once a week.  Weight bearing exercise is also recommended twice weekly.  Eat a healthy diet with lots of vegetables, fruits and fiber.  "Colorful" foods have a lot of vitamins (ie green vegetables, tomatoes, red peppers, etc).  Limit sweet tea, regular sodas and alcoholic beverages, all of which has a lot of calories and sugar.  Up to 2 alcoholic drinks daily may be beneficial for men (unless trying to lose weight, watch sugars).  Drink a lot of water.  Sunscreen of at least SPF 30 should be used on all sun-exposed parts of the skin when outside between the hours of 10 am and 4 pm (not just when at beach or pool, but even with exercise, golf, tennis, and yard work!)  Use a sunscreen that says "broad spectrum" so it covers both UVA and UVB rays, and make sure to reapply every 1-2 hours.  Remember to change the batteries in your smoke detectors when changing your clock times in the spring and fall.  Use your seat belt every time you are in a car, and please drive safely and not be distracted with cell phones and texting while driving.    Andre Walker , Thank you for taking time to come for your Medicare Wellness Visit. I appreciate your ongoing commitment to your health goals. Please review the following plan we discussed and let me know if I can assist you in the future.   These are the goals we discussed: Goals    None      This is a list of the screening recommended for you and due dates:  Health Maintenance  Topic Date Due  . Complete foot exam   11/12/2016  . Hemoglobin A1C  05/27/2017  . Eye exam for diabetics  09/10/2017  . Tetanus Vaccine   12/01/2020  . Flu Shot  Completed  . Pneumonia vaccines  Completed   Your foot exam was done today (and was normal). You were given a pneumovax booster today.  I recommend getting the new shingles vaccine (Shingrix)--I know you are already on the waiting list to get this at the pharmacy.  It is a series of 2 injections, spaced 2 months apart. You need to be sure to get both shots within a 6 month period (ideally 2 months apart, if available).

## 2017-01-24 ENCOUNTER — Observation Stay (HOSPITAL_COMMUNITY)
Admission: EM | Admit: 2017-01-24 | Discharge: 2017-01-25 | Disposition: A | Discharge status: Home / Self Care | Payer: Medicare Other | Attending: Internal Medicine | Admitting: Internal Medicine

## 2017-01-24 ENCOUNTER — Encounter (HOSPITAL_COMMUNITY): Payer: Self-pay | Admitting: Nurse Practitioner

## 2017-01-24 ENCOUNTER — Emergency Department (HOSPITAL_COMMUNITY): Payer: Medicare Other

## 2017-01-24 DIAGNOSIS — R350 Frequency of micturition: Secondary | ICD-10-CM | POA: Diagnosis not present

## 2017-01-24 DIAGNOSIS — Z85820 Personal history of malignant melanoma of skin: Secondary | ICD-10-CM | POA: Insufficient documentation

## 2017-01-24 DIAGNOSIS — I10 Essential (primary) hypertension: Secondary | ICD-10-CM | POA: Diagnosis not present

## 2017-01-24 DIAGNOSIS — Z880 Allergy status to penicillin: Secondary | ICD-10-CM | POA: Diagnosis not present

## 2017-01-24 DIAGNOSIS — E119 Type 2 diabetes mellitus without complications: Secondary | ICD-10-CM | POA: Diagnosis not present

## 2017-01-24 DIAGNOSIS — K8020 Calculus of gallbladder without cholecystitis without obstruction: Secondary | ICD-10-CM | POA: Insufficient documentation

## 2017-01-24 DIAGNOSIS — Z86010 Personal history of colonic polyps: Secondary | ICD-10-CM | POA: Insufficient documentation

## 2017-01-24 DIAGNOSIS — Z7984 Long term (current) use of oral hypoglycemic drugs: Secondary | ICD-10-CM | POA: Insufficient documentation

## 2017-01-24 DIAGNOSIS — N200 Calculus of kidney: Secondary | ICD-10-CM | POA: Diagnosis not present

## 2017-01-24 DIAGNOSIS — N179 Acute kidney failure, unspecified: Secondary | ICD-10-CM | POA: Diagnosis not present

## 2017-01-24 DIAGNOSIS — Z96653 Presence of artificial knee joint, bilateral: Secondary | ICD-10-CM | POA: Diagnosis not present

## 2017-01-24 DIAGNOSIS — N3000 Acute cystitis without hematuria: Secondary | ICD-10-CM | POA: Diagnosis not present

## 2017-01-24 DIAGNOSIS — J309 Allergic rhinitis, unspecified: Secondary | ICD-10-CM | POA: Diagnosis not present

## 2017-01-24 DIAGNOSIS — N400 Enlarged prostate without lower urinary tract symptoms: Secondary | ICD-10-CM | POA: Insufficient documentation

## 2017-01-24 DIAGNOSIS — E785 Hyperlipidemia, unspecified: Secondary | ICD-10-CM | POA: Insufficient documentation

## 2017-01-24 DIAGNOSIS — N529 Male erectile dysfunction, unspecified: Secondary | ICD-10-CM | POA: Insufficient documentation

## 2017-01-24 DIAGNOSIS — N401 Enlarged prostate with lower urinary tract symptoms: Secondary | ICD-10-CM | POA: Diagnosis not present

## 2017-01-24 DIAGNOSIS — Z7982 Long term (current) use of aspirin: Secondary | ICD-10-CM | POA: Diagnosis not present

## 2017-01-24 DIAGNOSIS — I700 Atherosclerosis of aorta: Secondary | ICD-10-CM | POA: Diagnosis not present

## 2017-01-24 DIAGNOSIS — N390 Urinary tract infection, site not specified: Secondary | ICD-10-CM | POA: Diagnosis present

## 2017-01-24 DIAGNOSIS — R7989 Other specified abnormal findings of blood chemistry: Secondary | ICD-10-CM

## 2017-01-24 DIAGNOSIS — E7800 Pure hypercholesterolemia, unspecified: Secondary | ICD-10-CM | POA: Diagnosis not present

## 2017-01-24 DIAGNOSIS — Z79899 Other long term (current) drug therapy: Secondary | ICD-10-CM | POA: Insufficient documentation

## 2017-01-24 DIAGNOSIS — R739 Hyperglycemia, unspecified: Secondary | ICD-10-CM

## 2017-01-24 LAB — I-STAT CG4 LACTIC ACID, ED: LACTIC ACID, VENOUS: 2.33 mmol/L — AB (ref 0.5–1.9)

## 2017-01-24 LAB — URINALYSIS, ROUTINE W REFLEX MICROSCOPIC
Bilirubin Urine: NEGATIVE
GLUCOSE, UA: 50 mg/dL — AB
KETONES UR: NEGATIVE mg/dL
NITRITE: NEGATIVE
PH: 5 (ref 5.0–8.0)
Protein, ur: 30 mg/dL — AB
Specific Gravity, Urine: 1.018 (ref 1.005–1.030)

## 2017-01-24 LAB — BASIC METABOLIC PANEL
ANION GAP: 9 (ref 5–15)
BUN: 35 mg/dL — ABNORMAL HIGH (ref 6–20)
CALCIUM: 9.5 mg/dL (ref 8.9–10.3)
CO2: 24 mmol/L (ref 22–32)
Chloride: 104 mmol/L (ref 101–111)
Creatinine, Ser: 1.6 mg/dL — ABNORMAL HIGH (ref 0.61–1.24)
GFR calc non Af Amer: 38 mL/min — ABNORMAL LOW (ref >60)
GFR, EST AFRICAN AMERICAN: 45 mL/min — AB (ref >60)
GLUCOSE: 225 mg/dL — AB (ref 65–99)
POTASSIUM: 4 mmol/L (ref 3.5–5.1)
Sodium: 137 mmol/L (ref 135–145)

## 2017-01-24 LAB — CBC
HEMATOCRIT: 37.2 % — AB (ref 39.0–52.0)
HEMOGLOBIN: 12.8 g/dL — AB (ref 13.0–17.0)
MCH: 32.7 pg (ref 26.0–34.0)
MCHC: 34.4 g/dL (ref 30.0–36.0)
MCV: 95.1 fL (ref 78.0–100.0)
Platelets: 236 10*3/uL (ref 150–400)
RBC: 3.91 MIL/uL — AB (ref 4.22–5.81)
RDW: 12.9 % (ref 11.5–15.5)
WBC: 14.7 10*3/uL — ABNORMAL HIGH (ref 4.0–10.5)

## 2017-01-24 LAB — LACTIC ACID, PLASMA
LACTIC ACID, VENOUS: 1.7 mmol/L (ref 0.5–1.9)
Lactic Acid, Venous: 1.4 mmol/L (ref 0.5–1.9)

## 2017-01-24 LAB — GLUCOSE, CAPILLARY: Glucose-Capillary: 143 mg/dL — ABNORMAL HIGH (ref 65–99)

## 2017-01-24 MED ORDER — INSULIN ASPART 100 UNIT/ML ~~LOC~~ SOLN
0.0000 [IU] | Freq: Three times a day (TID) | SUBCUTANEOUS | Status: DC
  Administered 2017-01-25 (×2): 1 [IU] via SUBCUTANEOUS

## 2017-01-24 MED ORDER — SODIUM CHLORIDE 0.9 % IV BOLUS (SEPSIS)
1000.0000 mL | Freq: Once | INTRAVENOUS | Status: AC
  Administered 2017-01-24: 1000 mL via INTRAVENOUS

## 2017-01-24 MED ORDER — ADULT MULTIVITAMIN W/MINERALS CH
1.0000 | ORAL_TABLET | Freq: Every day | ORAL | Status: DC
  Administered 2017-01-24 – 2017-01-25 (×2): 1 via ORAL
  Filled 2017-01-24 (×2): qty 1

## 2017-01-24 MED ORDER — GLUCOSAMINE-CHONDROITIN 500-400 MG PO TABS: 2.0000 | ORAL_TABLET | Freq: Every day | ORAL | Status: DC

## 2017-01-24 MED ORDER — ACETAMINOPHEN 325 MG PO TABS: 650.0000 mg | ORAL_TABLET | Freq: Four times a day (QID) | ORAL | Status: DC | PRN

## 2017-01-24 MED ORDER — CALCIUM CARBONATE-VITAMIN D 500-200 MG-UNIT PO TABS
1.0000 | ORAL_TABLET | Freq: Two times a day (BID) | ORAL | Status: DC
Start: 2017-01-24 — End: 2017-01-25
  Administered 2017-01-24 – 2017-01-25 (×2): 1 via ORAL
  Filled 2017-01-24 (×2): qty 1

## 2017-01-24 MED ORDER — SODIUM CHLORIDE 0.9 % IV SOLN
INTRAVENOUS | Status: AC
  Administered 2017-01-24: 20:00:00 via INTRAVENOUS

## 2017-01-24 MED ORDER — TERAZOSIN HCL 5 MG PO CAPS
5.0000 mg | ORAL_CAPSULE | Freq: Every day | ORAL | Status: DC
  Administered 2017-01-24: 5 mg via ORAL
  Filled 2017-01-24: qty 1

## 2017-01-24 MED ORDER — GLUCOSE BLOOD VI STRP: 1.0000 | ORAL_STRIP | Freq: Two times a day (BID) | Status: DC

## 2017-01-24 MED ORDER — ACETAMINOPHEN 650 MG RE SUPP: 650.0000 mg | Freq: Four times a day (QID) | RECTAL | Status: DC | PRN

## 2017-01-24 MED ORDER — DEXTROSE 5 % IV SOLN
1.0000 g | Freq: Once | INTRAVENOUS | Status: AC
  Administered 2017-01-24: 1 g via INTRAVENOUS
  Filled 2017-01-24: qty 10

## 2017-01-24 MED ORDER — COQ10 100 MG PO CAPS: 1.0000 | ORAL_CAPSULE | Freq: Every day | ORAL | Status: DC

## 2017-01-24 MED ORDER — ASPIRIN EC 81 MG PO TBEC
81.0000 mg | DELAYED_RELEASE_TABLET | Freq: Every day | ORAL | Status: DC
  Administered 2017-01-25: 81 mg via ORAL
  Filled 2017-01-24: qty 1

## 2017-01-24 MED ORDER — LORATADINE 10 MG PO TABS
10.0000 mg | ORAL_TABLET | Freq: Every day | ORAL | Status: DC
Start: 2017-01-25 — End: 2017-01-25
  Administered 2017-01-25: 10 mg via ORAL
  Filled 2017-01-24: qty 1

## 2017-01-24 MED ORDER — DEXTROSE 5 % IV SOLN
1.0000 g | INTRAVENOUS | Status: DC
  Administered 2017-01-25: 1 g via INTRAVENOUS
  Filled 2017-01-24 (×2): qty 10

## 2017-01-24 NOTE — Progress Notes (Signed)
Pt has arrived from ED to room 1514. Alert, oriented, and without c/o. VS WNL

## 2017-01-24 NOTE — ED Notes (Signed)
Pt has been asked to give a urine sample, pt states he is unable to at this time, urinal supplied.

## 2017-01-24 NOTE — Progress Notes (Signed)
PHARMACIST - PHYSICIAN ORDER COMMUNICATION  CONCERNING: P&T Medication Policy on Herbal Medications  DESCRIPTION:  This patient's order for:  Co-Q 10, Glucosamine-chondroitin  has been noted.  This product(s) is classified as an "herbal" or natural product. Due to a lack of definitive safety studies or FDA approval, nonstandard manufacturing practices, plus the potential risk of unknown drug-drug interactions while on inpatient medications, the Pharmacy and Therapeutics Committee does not permit the use of "herbal" or natural products of this type within [facility].   ACTION TAKEN: The pharmacy department is unable to verify this order at this time and your patient has been informed of this safety policy. Please reevaluate patient's clinical condition at discharge and address if the herbal or natural product(s) should be resumed at that time.  Hershal Coria, PharmD, BCPS Pager: [phone] 01/24/2017 7:16 PM

## 2017-01-24 NOTE — ED Triage Notes (Signed)
Patient reported having some urinary frequency and urgency for the past 2 days. He reports having a low grade temp and chills throughout the night. He woke up this morning wet from sweating and assumes he had a fever during the night. He took 2 Tylenol today at noon for fever (100.5) and chills. Denies any burning, reports pain in left flank area.

## 2017-01-24 NOTE — ED Notes (Signed)
Report given to Wendy, RN.

## 2017-01-24 NOTE — H&P (Signed)
History and Physical    Andre Walker TIW:[id number] DOB: 09-Feb-1933 DOA: 01/24/2017  PCP: Rita Ohara, MD  Patient coming from:  home  Chief Complaint:   chills  HPI: Andre Walker is a 83 y.o. male with medical history significant of BPH, DM comes in with one day of chills and urinary frequency with lower back pain.  No dysuria.  No cough.  No sob.  No hematuria.  He has had this before but its been awhile.  No recent abx.  No n/v/d.  Pt found to have uti and referred for admission for uti with mild aki.  Review of Systems: As per HPI otherwise 10 point review of systems negative.   Past Medical History:  Diagnosis Date  . Actinic keratosis    Dr. Allyson Sabal  . BPH (benign prostatic hyperplasia)    controlled on Hytrin  . Colon polyps    Dr. Watt Climes  . Diabetes mellitus 2004  . ED (erectile dysfunction)   . Hearing loss    high frequency  . Hiatal hernia    small, noted on EGD 10/2010  . Melanoma ([place])    right arm  . Microalbuminuria    on ACEI for microalbuminuria (does NOT have HTN)  . Pure hypercholesterolemia 2006  . Traumatic partial tear of biceps tendon 01/2009   "popeye" injury on right  . Wears glasses   . Wears partial dentures    lower    Past Surgical History:  Procedure Laterality Date  . APPENDECTOMY  1955  . CATARACT EXTRACTION Bilateral R 05/07/15, L 05/21/15   Dr. Katy Fitch  . COLONOSCOPY  11/09, 10/29/10   Dr. Watt Climes  . ESOPHAGOGASTRODUODENOSCOPY  10/29/10   Dr. Watt Climes  . INGUINAL HERNIA REPAIR Right 1989  . MELANOMA EXCISION  10/08/10   R upper arm, Dr. Hassell Done  . MELANOMA EXCISION  03/16/2012   Procedure: MELANOMA EXCISION;  Surgeon: Pedro Earls, MD;  Location: [facility];  Service: General;  Laterality: Right;  excision of melanoma of right arm  . TOTAL KNEE ARTHROPLASTY Right 2012  . TOTAL KNEE ARTHROPLASTY Left 2005     reports that  has never smoked. he has never used smokeless tobacco. He reports that he drinks alcohol. He reports that  he does not use drugs.  Allergies  Allergen Reactions  . Penicillins Rash    Has patient had a PCN reaction causing immediate rash, facial/tongue/throat swelling, SOB or lightheadedness with hypotension: No Has patient had a PCN reaction causing severe rash involving mucus membranes or skin necrosis: No Has patient had a PCN reaction that required hospitalization: No Has patient had a PCN reaction occurring within the last 10 years: No If all of the above answers are "NO", then may proceed with Cephalosporin use.     Family History  Problem Relation Age of Onset  . Diabetes Sister   . Diabetes Brother   . Heart disease Brother   . Hyperlipidemia Brother   . Hypertension Brother   . Stroke Brother   . Diabetes Sister   . Cancer Paternal Uncle   . Heart disease Paternal Uncle     Prior to Admission medications   Medication Sig Start Date End Date Taking? Authorizing Provider  aspirin 81 MG tablet Take 81 mg by mouth daily.     Yes [provider]  Calcium Carbonate-Vitamin D (CALCIUM 600+D HIGH POTENCY) 600-400 MG-UNIT per tablet Take 1 tablet by mouth 2 (two) times daily.    Yes  [provider]  Coenzyme Q10 (COQ10) 100 MG CAPS Take 1 capsule by mouth daily.     Yes [provider]  glucosamine-chondroitin 500-400 MG tablet Take 2 tablets by mouth daily.    Yes [provider]  glucose blood test strip 1 each by Other route 2 (two) times daily. Use as instructed   Yes [provider]  lisinopril (PRINIVIL,ZESTRIL) 5 MG tablet TAKE 1 TABLET BY MOUTH ONCE DAILY 11/17/16  Yes Rita Ohara, MD  loratadine (CLARITIN) 10 MG tablet Take 10 mg by mouth daily.   Yes [provider]  metFORMIN (GLUCOPHAGE-XR) 500 MG 24 hr tablet TAKE TWO TABLETS BY MOUTH ONCE DAILY WITH BREAKFAST 12/03/16  Yes Rita Ohara, MD  Multiple Vitamins-Minerals (MULTIVITAMIN WITH MINERALS) tablet Take 1 tablet by mouth daily.     Yes [provider]    Omega-3 Fatty Acids (FISH OIL PO) Take 1,600 mg by mouth daily.   Yes [provider]  [facility] DELICA LANCETS 94B MISC by Does not apply route.   Yes [provider]  simvastatin (ZOCOR) 20 MG tablet TAKE ONE TABLET BY MOUTH ONCE DAILY WITH BREAKFAST 11/17/16  Yes Rita Ohara, MD  terazosin (HYTRIN) 5 MG capsule TAKE 1 CAPSULE BY MOUTH AT BEDTIME 07/22/16  Toma Aran, MD    Physical Exam: Vitals:   01/24/17 1532 01/24/17 1602 01/24/17 1636 01/24/17 1638  BP: (!) 101/49 (!) 96/52 (!) 121/53 (!) 121/53  Pulse: 68 65 65 64  Resp: 19 19 19 19   Temp:      TempSrc:      SpO2: 90% 95% 96%   Weight:      Height:          Constitutional: NAD, calm, comfortable Vitals:   01/24/17 1532 01/24/17 1602 01/24/17 1636 01/24/17 1638  BP: (!) 101/49 (!) 96/52 (!) 121/53 (!) 121/53  Pulse: 68 65 65 64  Resp: 19 19 19 19   Temp:      TempSrc:      SpO2: 90% 95% 96%   Weight:      Height:       Eyes: PERRL, lids and conjunctivae normal ENMT: Mucous membranes are moist. Posterior pharynx clear of any exudate or lesions.Normal dentition.  Neck: normal, supple, no masses, no thyromegaly Respiratory: clear to auscultation bilaterally, no wheezing, no crackles. Normal respiratory effort. No accessory muscle use.  Cardiovascular: Regular rate and rhythm, no murmurs / rubs / gallops. No extremity edema. 2+ pedal pulses. No carotid bruits.  Abdomen: no tenderness, no masses palpated. No hepatosplenomegaly. Bowel sounds positive.  Musculoskeletal: no clubbing / cyanosis. No joint deformity upper and lower extremities. Good ROM, no contractures. Normal muscle tone.  Skin: no rashes, lesions, ulcers. No induration Neurologic: CN 2-12 grossly intact. Sensation intact, DTR normal. Strength 5/5 in all 4.  Psychiatric: Normal judgment and insight. Alert and oriented x 3. Normal mood.    Labs on Admission: I have personally reviewed following labs and imaging studies  CBC: Recent Labs   Lab 01/24/17 1423  WBC 14.7*  HGB 12.8*  HCT 37.2*  MCV 95.1  PLT 096   Basic Metabolic Panel: Recent Labs  Lab 01/24/17 1423  NA 137  K 4.0  CL 104  CO2 24  GLUCOSE 225*  BUN 35*  CREATININE 1.60*  CALCIUM 9.5   GFR: Estimated Creatinine Clearance: 39.1 mL/min (A) (by C-G formula based on SCr of 1.6 mg/dL (H)). Liver Function Tests: No results for input(s): AST, ALT,  ALKPHOS, BILITOT, PROT, ALBUMIN in the last 168 hours. No results for input(s): LIPASE, AMYLASE in the last 168 hours. No results for input(s): AMMONIA in the last 168 hours. Coagulation Profile: No results for input(s): INR, PROTIME in the last 168 hours. Cardiac Enzymes: No results for input(s): CKTOTAL, CKMB, CKMBINDEX, TROPONINI in the last 168 hours. BNP (last 3 results) No results for input(s): PROBNP in the last 8760 hours. HbA1C: No results for input(s): HGBA1C in the last 72 hours. CBG: No results for input(s): GLUCAP in the last 168 hours. Lipid Profile: No results for input(s): CHOL, HDL, LDLCALC, TRIG, CHOLHDL, LDLDIRECT in the last 72 hours. Thyroid Function Tests: No results for input(s): TSH, T4TOTAL, FREET4, T3FREE, THYROIDAB in the last 72 hours. Anemia Panel: No results for input(s): VITAMINB12, FOLATE, FERRITIN, TIBC, IRON, RETICCTPCT in the last 72 hours. Urine analysis:    Component Value Date/Time   COLORURINE AMBER (A) 01/24/2017 1421   APPEARANCEUR TURBID (A) 01/24/2017 1421   LABSPEC 1.018 01/24/2017 1421   PHURINE 5.0 01/24/2017 1421   GLUCOSEU 50 (A) 01/24/2017 1421   HGBUR MODERATE (A) 01/24/2017 1421   BILIRUBINUR NEGATIVE 01/24/2017 1421   BILIRUBINUR neg 03/29/2014 0943   KETONESUR NEGATIVE 01/24/2017 1421   PROTEINUR 30 (A) 01/24/2017 1421   UROBILINOGEN negative 03/29/2014 0943   UROBILINOGEN 0.2 03/25/2010 1030   NITRITE NEGATIVE 01/24/2017 1421   LEUKOCYTESUR LARGE (A) 01/24/2017 1421   Sepsis Labs:  !!!!!!!!!!!!!!!!!!!!!!!!!!!!!!!!!!!!!!!!!!!! @LABRCNTIP (procalcitonin:4,lacticidven:4) )No results found for this or any previous visit (from the past 240 hour(s)).   Radiological Exams on Admission: Ct Renal Stone Study  Result Date: 01/24/2017 CLINICAL DATA:  Left flank pain, low grade fever, urinary frequency, and urgency for past 2 days. EXAM: CT ABDOMEN AND PELVIS WITHOUT CONTRAST TECHNIQUE: Multidetector CT imaging of the abdomen and pelvis was performed following the standard protocol without IV contrast. COMPARISON:  None. FINDINGS: Lower chest: No acute findings. Hepatobiliary: No mass visualized on this unenhanced exam. Multiple tiny calcified gallstones noted, however there is no evidence of cholecystitis or biliary ductal dilatation. Pancreas: No mass or inflammatory process visualized on this unenhanced exam. Spleen:  Within normal limits in size. Adrenals/Urinary tract: A few tiny 1-2 mm nonobstructing calculi are seen in the left kidney. No evidence of ureteral calculi or hydronephrosis. Parenchymal scarring seen in the upper pole of right kidney. Stomach/Bowel: No evidence of obstruction, inflammatory process, or abnormal fluid collections. Vascular/Lymphatic: No pathologically enlarged lymph nodes identified. No evidence of abdominal aortic aneurysm. Aortic atherosclerosis. Reproductive: Mildly enlarged prostate with mass effect on bladder base. Other:  None. Musculoskeletal: No suspicious bone lesions identified. Advanced degenerative disc disease is seen at multiple levels in the lower thoracic and lumbar spine. IMPRESSION: Tiny nonobstructing left renal calculi. No evidence of ureteral calculi, hydronephrosis, or other acute findings. Cholelithiasis.  No radiographic evidence of cholecystitis. Mildly enlarged prostate. Aortic atherosclerosis. Electronically Signed   By: Earle Gell M.D.   On: 01/24/2017 15:45     Assessment/Plan 83 yo male with chills, subjective fever, increased  urinary frequency  Principal Problem:   UTI (urinary tract infection)- rocephin.  Imaging shows no complications.  urine cx obtained.  Lactic mildly elevated, ivf and serial until normalizes  Active Problems:   Type 2 diabetes mellitus, controlled (HCC)-hold metformin and place on ssi   Benign prostatic hyperplasia- noted   AKI (acute kidney injury) (Experiment)- cr bump up to 1.6 normal is 1.  Hold ace inh.  Ivf.  Repeat in am.  DVT prophylaxis:  scds Code Status: full Family Communication: wife Disposition Plan:  Per day team Consults called:  none Admission status:  observation   Suzie Vandam A MD [facility]  If 7PM-7AM, please contact night-coverage [email or web address] Password [email or web address]  01/24/2017, 5:05 PM

## 2017-01-24 NOTE — ED Notes (Signed)
Date and time results received: 01/24/17 15:31   Test: Lactic Acid Critical Value: 2.33

## 2017-01-24 NOTE — ED Provider Notes (Signed)
[facility] DEPT Provider Note   CSN: [id number] Arrival date & time: 01/24/17  1351     History   Chief Complaint Chief Complaint  Patient presents with  . Urinary Frequency  . Fever  . Chills    HPI Andre Walker is a 83 y.o. male.  HPI  83 y.o. male with a hx of DM, presents to the Emergency Department today due to chills as well as polyuria since Thursday. Notes one episode of emesis PTA. No diarrhea. No CP/SOB. Rates right sided flank pain. 2/10. Throbbing. Denies dysuria. Mainly urinary frequency. Notes subjective fevers. Took tylenol around noon PTA. Endorses chills at night with sweats. No meds PTA. No other symptoms noted   Past Medical History:  Diagnosis Date  . Actinic keratosis    Dr. Allyson Sabal  . BPH (benign prostatic hyperplasia)    controlled on Hytrin  . Colon polyps    Dr. Watt Climes  . Diabetes mellitus 2004  . ED (erectile dysfunction)   . Hearing loss    high frequency  . Hiatal hernia    small, noted on EGD 10/2010  . Melanoma ([place])    right arm  . Microalbuminuria    on ACEI for microalbuminuria (does NOT have HTN)  . Pure hypercholesterolemia 2006  . Traumatic partial tear of biceps tendon 01/2009   "popeye" injury on right  . Wears glasses   . Wears partial dentures    lower    Patient Active Problem List   Diagnosis Date Noted  . Squamous cell carcinoma of leg, right 01/14/2016  . Senile purpura ([place]) 09/18/2014  . Increased prostate specific antigen (PSA) velocity 03/22/2014  . BPH (benign prostatic hypertrophy) 03/22/2014  . Melanoma of right arm 03/05/2012  . Actinic keratoses 12/03/2011  . Pure hypercholesterolemia 12/02/2010  . Type 2 diabetes mellitus, controlled ([place]) 12/02/2010    Past Surgical History:  Procedure Laterality Date  . APPENDECTOMY  1955  . CATARACT EXTRACTION Bilateral R 05/07/15, L 05/21/15   Dr. Katy Fitch  . COLONOSCOPY  11/09, 10/29/10   Dr. Watt Climes  . ESOPHAGOGASTRODUODENOSCOPY  10/29/10    Dr. Watt Climes  . INGUINAL HERNIA REPAIR Right 1989  . MELANOMA EXCISION  10/08/10   R upper arm, Dr. Hassell Done  . MELANOMA EXCISION  03/16/2012   Procedure: MELANOMA EXCISION;  Surgeon: Pedro Earls, MD;  Location: [facility];  Service: General;  Laterality: Right;  excision of melanoma of right arm  . TOTAL KNEE ARTHROPLASTY Right 2012  . TOTAL KNEE ARTHROPLASTY Left 2005       Home Medications    Prior to Admission medications   Medication Sig Start Date End Date Taking? Authorizing Provider  aspirin 81 MG tablet Take 81 mg by mouth daily.      [provider]  Calcium Carbonate-Vitamin D (CALCIUM 600+D HIGH POTENCY) 600-400 MG-UNIT per tablet Take 1 tablet by mouth 2 (two) times daily.     [provider]  Coenzyme Q10 (COQ10) 100 MG CAPS Take 1 capsule by mouth daily.      [provider]  glucosamine-chondroitin 500-400 MG tablet Take 2 tablets by mouth daily.     [provider]  glucose blood test strip 1 each by Other route 2 (two) times daily. Use as instructed    [provider]  lisinopril (PRINIVIL,ZESTRIL) 5 MG tablet TAKE 1 TABLET BY MOUTH ONCE DAILY 11/17/16   Rita Ohara, MD  metFORMIN (GLUCOPHAGE-XR) 500 MG 24 hr tablet  TAKE TWO TABLETS BY MOUTH ONCE DAILY WITH BREAKFAST 12/03/16   Rita Ohara, MD  Multiple Vitamins-Minerals (MULTIVITAMIN WITH MINERALS) tablet Take 1 tablet by mouth daily.      [provider]  Omega-3 Fatty Acids (FISH OIL PO) Take 1,600 mg by mouth daily.    [provider]  [facility] DELICA LANCETS 50K MISC by Does not apply route.    [provider]  simvastatin (ZOCOR) 20 MG tablet TAKE ONE TABLET BY MOUTH ONCE DAILY WITH BREAKFAST 11/17/16   Rita Ohara, MD  terazosin (HYTRIN) 5 MG capsule TAKE 1 CAPSULE BY MOUTH AT BEDTIME 07/22/16   Rita Ohara, MD    Family History Family History  Problem Relation Age of Onset  . Diabetes Sister   . Diabetes Brother   . Heart  disease Brother   . Hyperlipidemia Brother   . Hypertension Brother   . Stroke Brother   . Diabetes Sister   . Cancer Paternal Uncle   . Heart disease Paternal Uncle     Social History Social History   Tobacco Use  . Smoking status: Never Smoker  . Smokeless tobacco: Never Used  Substance Use Topics  . Alcohol use: Yes    Alcohol/week: 0.0 oz    Types: 2 - 3 Glasses of wine per week    Comment: 1 glass of wine 1-2 times per month.  . Drug use: No     Allergies   Penicillins   Review of Systems Review of Systems ROS reviewed and all are negative for acute change except as noted in the HPI.  Physical Exam Updated Vital Signs BP (!) 115/52 (BP Location: Right Arm)   Pulse 98   Temp 97.7 F (36.5 C) (Oral)   Resp 18   Ht 6' (1.829 m)   Wt 86.2 kg (190 lb)   SpO2 96%   BMI 25.77 kg/m   Physical Exam  Constitutional: He is oriented to person, place, and time. He appears well-developed and well-nourished. No distress.  HENT:  Head: Normocephalic and atraumatic.  Right Ear: Tympanic membrane, external ear and ear canal normal.  Left Ear: Tympanic membrane, external ear and ear canal normal.  Nose: Nose normal.  Mouth/Throat: Uvula is midline, oropharynx is clear and moist and mucous membranes are normal. No trismus in the jaw. No oropharyngeal exudate, posterior oropharyngeal erythema or tonsillar abscesses.  Eyes: EOM are normal. Pupils are equal, round, and reactive to light.  Neck: Normal range of motion. Neck supple. No tracheal deviation present.  Cardiovascular: Normal rate, regular rhythm, S1 normal, S2 normal, normal heart sounds, intact distal pulses and normal pulses.  Pulmonary/Chest: Effort normal and breath sounds normal. No respiratory distress. He has no decreased breath sounds. He has no wheezes. He has no rhonchi. He has no rales.  Abdominal: Normal appearance and bowel sounds are normal. There is no tenderness. There is CVA tenderness. There is no  rigidity, no rebound, no guarding, no tenderness at McBurney's point and negative Murphy's sign.  Right CVA tenderness. Abdomen soft. Non tender  Musculoskeletal: Normal range of motion.  Neurological: He is alert and oriented to person, place, and time.  Skin: Skin is warm and dry.  Psychiatric: He has a normal mood and affect. His speech is normal and behavior is normal. Thought content normal.  Nursing note and vitals reviewed.    ED Treatments / Results  Labs (all labs ordered are listed, but only abnormal results are displayed) Labs Reviewed  URINALYSIS, ROUTINE W  REFLEX MICROSCOPIC - Abnormal; Notable for the following components:      Result Value   Color, Urine AMBER (*)    APPearance TURBID (*)    Glucose, UA 50 (*)    Hgb urine dipstick MODERATE (*)    Protein, ur 30 (*)    Leukocytes, UA LARGE (*)    Bacteria, UA MANY (*)    Squamous Epithelial / LPF 0-5 (*)    All other components within normal limits  BASIC METABOLIC PANEL - Abnormal; Notable for the following components:   Glucose, Bld 225 (*)    BUN 35 (*)    Creatinine, Ser 1.60 (*)    GFR calc non Af Amer 38 (*)    GFR calc Af Amer 45 (*)    All other components within normal limits  CBC - Abnormal; Notable for the following components:   WBC 14.7 (*)    RBC 3.91 (*)    Hemoglobin 12.8 (*)    HCT 37.2 (*)    All other components within normal limits  I-STAT CG4 LACTIC ACID, ED - Abnormal; Notable for the following components:   Lactic Acid, Venous 2.33 (*)    All other components within normal limits  URINE CULTURE    EKG  EKG Interpretation None       Radiology Ct Renal Stone Study  Result Date: 01/24/2017 CLINICAL DATA:  Left flank pain, low grade fever, urinary frequency, and urgency for past 2 days. EXAM: CT ABDOMEN AND PELVIS WITHOUT CONTRAST TECHNIQUE: Multidetector CT imaging of the abdomen and pelvis was performed following the standard protocol without IV contrast. COMPARISON:  None.  FINDINGS: Lower chest: No acute findings. Hepatobiliary: No mass visualized on this unenhanced exam. Multiple tiny calcified gallstones noted, however there is no evidence of cholecystitis or biliary ductal dilatation. Pancreas: No mass or inflammatory process visualized on this unenhanced exam. Spleen:  Within normal limits in size. Adrenals/Urinary tract: A few tiny 1-2 mm nonobstructing calculi are seen in the left kidney. No evidence of ureteral calculi or hydronephrosis. Parenchymal scarring seen in the upper pole of right kidney. Stomach/Bowel: No evidence of obstruction, inflammatory process, or abnormal fluid collections. Vascular/Lymphatic: No pathologically enlarged lymph nodes identified. No evidence of abdominal aortic aneurysm. Aortic atherosclerosis. Reproductive: Mildly enlarged prostate with mass effect on bladder base. Other:  None. Musculoskeletal: No suspicious bone lesions identified. Advanced degenerative disc disease is seen at multiple levels in the lower thoracic and lumbar spine. IMPRESSION: Tiny nonobstructing left renal calculi. No evidence of ureteral calculi, hydronephrosis, or other acute findings. Cholelithiasis.  No radiographic evidence of cholecystitis. Mildly enlarged prostate. Aortic atherosclerosis. Electronically Signed   By: Earle Gell M.D.   On: 01/24/2017 15:45    Procedures Procedures (including critical care time)  Medications Ordered in ED Medications - No data to display   Initial Impression / Assessment and Plan / ED Course  I have reviewed the triage vital signs and the nursing notes.  Pertinent labs & imaging results that were available during my care of the patient were reviewed by me and considered in my medical decision making (see chart for details).  Final Clinical Impressions(s) / ED Diagnoses  {I have reviewed and evaluated the relevant laboratory values. {I have reviewed and evaluated the relevant imaging studies.  {I have reviewed the relevant  previous healthcare records.  {I obtained HPI from historian. {Patient discussed with supervising physician.  ED Course:  Assessment: Pt is a 83 y.o. male with a hx of  DM, presents to the Emergency Department today due to chills as well as polyuria since Thursday. Notes one episode of emesis PTA. No diarrhea. No CP/SOB. Rates right sided flank pain. 2/10. Throbbing. Denies dysuria. Mainly urinary frequency. Notes subjective fevers. Took tylenol around noon PTA. Endorses chills at night with sweats. No meds PTA. On exam, pt in NAD. Nontoxic/nonseptic appearing. VSS. BP soft at 92/50. Afebrile. Lungs CTA. Heart RRR. Abdomen nontender soft. iIld right CVA tenderness. UA with evidence of UTI. Culture sent. iStat Lactic 2.. CBC with WBC 14. CMP with elevation of Creatinine 1.60. CT renal without evidence of Hydro or stone. Given Rocephin and 1L NS bolus in ED. Seen by attending physician. Plan is to Admit due to elevated creatinine, source of infection, and soft BP.    Disposition/Plan:  Admit Pt acknowledges and agrees with plan  Supervising Physician Davonna Belling, MD  Final diagnoses:  Urinary tract infection without hematuria, site unspecified  Elevated serum creatinine  Hyperglycemia    ED Discharge Orders    None       Shary Decamp, PA-C 01/24/17 [place], Nathan, MD 01/24/17 2350

## 2017-01-24 NOTE — ED Notes (Signed)
Lab needs urine culture. Urine sent without culture.

## 2017-01-25 ENCOUNTER — Other Ambulatory Visit: Payer: Self-pay

## 2017-01-25 DIAGNOSIS — R350 Frequency of micturition: Secondary | ICD-10-CM | POA: Diagnosis not present

## 2017-01-25 DIAGNOSIS — E119 Type 2 diabetes mellitus without complications: Secondary | ICD-10-CM

## 2017-01-25 DIAGNOSIS — N390 Urinary tract infection, site not specified: Secondary | ICD-10-CM | POA: Diagnosis not present

## 2017-01-25 DIAGNOSIS — N179 Acute kidney failure, unspecified: Secondary | ICD-10-CM | POA: Diagnosis not present

## 2017-01-25 DIAGNOSIS — N401 Enlarged prostate with lower urinary tract symptoms: Secondary | ICD-10-CM | POA: Diagnosis not present

## 2017-01-25 DIAGNOSIS — R7989 Other specified abnormal findings of blood chemistry: Secondary | ICD-10-CM | POA: Diagnosis not present

## 2017-01-25 DIAGNOSIS — N3000 Acute cystitis without hematuria: Secondary | ICD-10-CM | POA: Diagnosis not present

## 2017-01-25 LAB — CBC
HEMATOCRIT: 33.8 % — AB (ref 39.0–52.0)
Hemoglobin: 11.2 g/dL — ABNORMAL LOW (ref 13.0–17.0)
MCH: 31.9 pg (ref 26.0–34.0)
MCHC: 33.1 g/dL (ref 30.0–36.0)
MCV: 96.3 fL (ref 78.0–100.0)
PLATELETS: 208 10*3/uL (ref 150–400)
RBC: 3.51 MIL/uL — AB (ref 4.22–5.81)
RDW: 12.8 % (ref 11.5–15.5)
WBC: 9.9 10*3/uL (ref 4.0–10.5)

## 2017-01-25 LAB — GLUCOSE, CAPILLARY
GLUCOSE-CAPILLARY: 149 mg/dL — AB (ref 65–99)
Glucose-Capillary: 121 mg/dL — ABNORMAL HIGH (ref 65–99)

## 2017-01-25 LAB — BASIC METABOLIC PANEL
Anion gap: 8 (ref 5–15)
BUN: 35 mg/dL — AB (ref 6–20)
CO2: 23 mmol/L (ref 22–32)
CREATININE: 1.33 mg/dL — AB (ref 0.61–1.24)
Calcium: 9.1 mg/dL (ref 8.9–10.3)
Chloride: 106 mmol/L (ref 101–111)
GFR, EST AFRICAN AMERICAN: 56 mL/min — AB (ref >60)
GFR, EST NON AFRICAN AMERICAN: 48 mL/min — AB (ref >60)
Glucose, Bld: 131 mg/dL — ABNORMAL HIGH (ref 65–99)
POTASSIUM: 4.1 mmol/L (ref 3.5–5.1)
SODIUM: 137 mmol/L (ref 135–145)

## 2017-01-25 MED ORDER — LISINOPRIL 5 MG PO TABS: 5.0000 mg | ORAL_TABLET | Freq: Every day | ORAL | Status: DC

## 2017-01-25 MED ORDER — CEFUROXIME AXETIL 500 MG PO TABS: 500.0000 mg | ORAL_TABLET | Freq: Two times a day (BID) | ORAL | 0 refills | Status: AC

## 2017-01-25 NOTE — Discharge Summary (Signed)
Physician Discharge Summary  BEN HABERMANN ZOX:[id number] DOB: 1932-11-16 DOA: 01/24/2017  PCP: Rita Ohara, MD  Admit date: 01/24/2017 Discharge date: 01/25/2017  Time spent: 30 minutes  Recommendations for Outpatient Follow-up:  Repeat basic metabolic panel to follow electrolytes and renal function Reassess blood pressure and make sure the patient has resumed his lisinopril (this medication was held at the moment of discharge given the acute kidney injury). Follow final urine culture results and sensitivity and make any changes to his antibiotic regimen as needed.  Discharge Diagnoses:  Principal Problem:   UTI (urinary tract infection) Active Problems:   Type 2 diabetes mellitus, controlled (HCC)   Benign prostatic hyperplasia   AKI (acute kidney injury) (HCC)   Elevated serum creatinine Dyslipidemia Hypertension Allergic rhinitis  Discharge Condition: Improved and stable.  Patient denies chest pain, no nausea, no vomiting, no dysuria.  Has been discharged with instructions to keep himself well-hydrated and to follow-up with PCP in 1 week.  Diet recommendation: Heart healthy and modified carbohydrates diet  Filed Weights   01/24/17 1419  Weight: 86.2 kg (190 lb)    History of present illness:  As per H&P written by Dr. Shanon Brow on 01/24/17 83 y.o. male with medical history significant of BPH, DM comes in with one day of chills and urinary frequency with lower back pain.  No dysuria.  No cough.  No sob.  No hematuria.  He has had this before but its been awhile.  No recent abx.  No n/v/d.  Pt found to have uti and referred for admission for uti with mild aki.  Hospital Course:  1-UTI:  -Rapidly improved with IV fluids resuscitation and IV antibiotics. -Urine culture pending at the moment of discharge -Empirical treatment with Ceftin 500 mg twice a day decided -Patient instructed to keep himself well-hydrated and to follow-up with PCP in 1 week.  No nausea, no vomiting, no dysuria  no fever and normal WBCs at discharge.  2-acute kidney injury -Appears to be secondary to dehydration along with UTI and continue use of nephrotoxic agents. -Improve and essentially back to normal after fluid resuscitation and is stopping nephrotoxic agents -Treatment for his UTI will be continue with antibiotics -Patient instructed to keep yourself hydrated -Lisinopril will be kept on hold for the next 2 days and the patient has been advised to follow-up with PCP in 1 week to follow renal function trend.  3-type 2 diabetes mellitus: Controlled and on oral hypoglycemic regimen.  -Will resume metformin -Advised to follow modified carbohydrate diet.  4-hypertension: -Stable and well-controlled -Patient lisinopril held at the moment of discharge due to acute kidney injury -Advised to maintain a low-sodium diet -Reassess BP and resume medications noted outpatient follow-up visit.  5-BPH -Will continue the use of terazosin   6-allergic rhinitis -Continue loratadine.  7-dyslipidemia -Continue Co-Q10 and fish oil   Procedures:  See below for x-ray reports  Consultations:  None  Discharge Exam: Vitals:   01/24/17 2129 01/25/17 0500  BP: 125/65 (!) 115/59  Pulse: 67 95  Resp: 18 18  Temp: 99.2 F (37.3 C)   SpO2: 98% 94%    General: Afebrile, no chest pain, no shortness of breath, no nausea, no vomiting, no dysuria and demanding to go home. Cardiovascular: S1 and S2, no rubs, no gallops, no JVD Respiratory: Good air movement bilaterally, no wheezing, no crackles, normal respiratory effort. Abdomen: Soft, nontender, nondistended, positive bowel sounds. Extremities: No edema, no cyanosis or clubbing.  Discharge Instructions   Discharge Instructions  Diet - low sodium heart healthy   Complete by:  As directed    Discharge instructions   Complete by:  As directed    Take medications as prescribed Keep yourself well hydrated  Hold lisinopril until 01/27/17 Arrange  follow up with PCP in 1 week     Allergies as of 01/25/2017      Reactions   Penicillins Rash   Has patient had a PCN reaction causing immediate rash, facial/tongue/throat swelling, SOB or lightheadedness with hypotension: No Has patient had a PCN reaction causing severe rash involving mucus membranes or skin necrosis: No Has patient had a PCN reaction that required hospitalization: No Has patient had a PCN reaction occurring within the last 10 years: No If all of the above answers are "NO", then may proceed with Cephalosporin use.      Medication List    TAKE these medications   aspirin 81 MG tablet Take 81 mg by mouth daily.   CALCIUM 600+D HIGH POTENCY 600-400 MG-UNIT tablet Generic drug:  Calcium Carbonate-Vitamin D Take 1 tablet by mouth 2 (two) times daily.   cefUROXime 500 MG tablet Commonly known as:  CEFTIN Take 1 tablet (500 mg total) by mouth 2 (two) times daily for 8 days.   CoQ10 100 MG Caps Take 1 capsule by mouth daily.   FISH OIL PO Take 1,600 mg by mouth daily.   glucosamine-chondroitin 500-400 MG tablet Take 2 tablets by mouth daily.   glucose blood test strip 1 each by Other route 2 (two) times daily. Use as instructed   lisinopril 5 MG tablet Commonly known as:  PRINIVIL,ZESTRIL Take 1 tablet (5 mg total) by mouth daily. Resume medication in 2 days What changed:  additional instructions   loratadine 10 MG tablet Commonly known as:  CLARITIN Take 10 mg by mouth daily.   metFORMIN 500 MG 24 hr tablet Commonly known as:  GLUCOPHAGE-XR TAKE TWO TABLETS BY MOUTH ONCE DAILY WITH BREAKFAST   multivitamin with minerals tablet Take 1 tablet by mouth daily.   ONETOUCH DELICA LANCETS 35H Misc by Does not apply route.   simvastatin 20 MG tablet Commonly known as:  ZOCOR TAKE ONE TABLET BY MOUTH ONCE DAILY WITH BREAKFAST   terazosin 5 MG capsule Commonly known as:  HYTRIN TAKE 1 CAPSULE BY MOUTH AT BEDTIME      Allergies  Allergen Reactions   . Penicillins Rash    Has patient had a PCN reaction causing immediate rash, facial/tongue/throat swelling, SOB or lightheadedness with hypotension: No Has patient had a PCN reaction causing severe rash involving mucus membranes or skin necrosis: No Has patient had a PCN reaction that required hospitalization: No Has patient had a PCN reaction occurring within the last 10 years: No If all of the above answers are "NO", then may proceed with Cephalosporin use.    Follow-up Information    Rita Ohara, MD. Schedule an appointment as soon as possible for a visit in 1 week(s).   Specialty:  Family Medicine Contact information: [address] [place] [address] [phone]           The results of significant diagnostics from this hospitalization (including imaging, microbiology, ancillary and laboratory) are listed below for reference.    Significant Diagnostic Studies: Ct Renal Stone Study  Result Date: 01/24/2017 CLINICAL DATA:  Left flank pain, low grade fever, urinary frequency, and urgency for past 2 days. EXAM: CT ABDOMEN AND PELVIS WITHOUT CONTRAST TECHNIQUE: Multidetector CT imaging of the abdomen and  pelvis was performed following the standard protocol without IV contrast. COMPARISON:  None. FINDINGS: Lower chest: No acute findings. Hepatobiliary: No mass visualized on this unenhanced exam. Multiple tiny calcified gallstones noted, however there is no evidence of cholecystitis or biliary ductal dilatation. Pancreas: No mass or inflammatory process visualized on this unenhanced exam. Spleen:  Within normal limits in size. Adrenals/Urinary tract: A few tiny 1-2 mm nonobstructing calculi are seen in the left kidney. No evidence of ureteral calculi or hydronephrosis. Parenchymal scarring seen in the upper pole of right kidney. Stomach/Bowel: No evidence of obstruction, inflammatory process, or abnormal fluid collections. Vascular/Lymphatic: No pathologically enlarged lymph  nodes identified. No evidence of abdominal aortic aneurysm. Aortic atherosclerosis. Reproductive: Mildly enlarged prostate with mass effect on bladder base. Other:  None. Musculoskeletal: No suspicious bone lesions identified. Advanced degenerative disc disease is seen at multiple levels in the lower thoracic and lumbar spine. IMPRESSION: Tiny nonobstructing left renal calculi. No evidence of ureteral calculi, hydronephrosis, or other acute findings. Cholelithiasis.  No radiographic evidence of cholecystitis. Mildly enlarged prostate. Aortic atherosclerosis. Electronically Signed   By: Earle Gell M.D.   On: 01/24/2017 15:45   Labs: Basic Metabolic Panel: Recent Labs  Lab 01/24/17 1423 01/25/17 0615  NA 137 137  K 4.0 4.1  CL 104 106  CO2 24 23  GLUCOSE 225* 131*  BUN 35* 35*  CREATININE 1.60* 1.33*  CALCIUM 9.5 9.1   CBC: Recent Labs  Lab 01/24/17 1423 01/25/17 0615  WBC 14.7* 9.9  HGB 12.8* 11.2*  HCT 37.2* 33.8*  MCV 95.1 96.3  PLT 236 208   CBG: Recent Labs  Lab 01/24/17 2132 01/25/17 0746 01/25/17 1128  GLUCAP 143* 121* 149*     Signed: Barton Dubois MD.  [facility] 01/25/2017, 12:26 PM

## 2017-01-26 ENCOUNTER — Telehealth: Payer: Self-pay | Admitting: Family Medicine

## 2017-01-26 LAB — URINE CULTURE: CULTURE: NO GROWTH

## 2017-01-26 NOTE — Telephone Encounter (Signed)
Pt was called concerning recent hospital stay. He states he is feeling better. Current and discharge meds were reconciled. Pt was placed on cefuroxime and he did get that filled and took first does this am. Per discharge summary pt was to discontinue lisinopril for 2 days. He is aware. He also stated he was almost out terazosin but has enough to last until his appt that was made for Thursday. Pt was informed to bring all meds with him and he verified understanding. After hour protocol was discussed and again he verified understanding.

## 2017-01-28 NOTE — Progress Notes (Signed)
Chief Complaint  Patient presents with  . Hospitalization Follow-up    was hopitalized, UTI. Feels much better. On abx, did give urine sample so I UA and it was negative.     Patient presents for transition of care/hospital follow-up visit. He was admitted 12/1-12/2/18 with UTI, AKI/elevated Cr.  He was treated with Ceftin. He was given rx for 8 days, and is taking this without side effects. He was told to hold his lisinopril for 2 days, then resume it at usual dose. He did this, as directed. He reports feeling well, no urinary complaints, or side effects of antibiotic.  He is here to recheck his BP being back on lisinopril, f/u b-met to reassess his electrolytes and renal function.   Hospital course and studies reviewed.   Chemistry      Component Value Date/Time   NA 137 01/25/2017 0615   K 4.1 01/25/2017 0615   CL 106 01/25/2017 0615   CO2 23 01/25/2017 0615   BUN 35 (H) 01/25/2017 0615   CREATININE 1.33 (H) 01/25/2017 0615   CREATININE 1.05 11/26/2016 0745      Component Value Date/Time   CALCIUM 9.1 01/25/2017 0615   ALKPHOS 70 05/06/2016 0846   AST 15 11/26/2016 0745   ALT 18 11/26/2016 0745   BILITOT 0.7 11/26/2016 0745     Urine culture did not show any growth.  PMH, PSH, SH reviewed  Outpatient Encounter Medications as of 01/29/2017  Medication Sig Note  . aspirin 81 MG tablet Take 81 mg by mouth daily.     . Calcium Carbonate-Vitamin D (CALCIUM 600+D HIGH POTENCY) 600-400 MG-UNIT per tablet Take 1 tablet by mouth 2 (two) times daily.    . cefUROXime (CEFTIN) 500 MG tablet Take 1 tablet (500 mg total) by mouth 2 (two) times daily for 8 days.   . Coenzyme Q10 (COQ10) 100 MG CAPS Take 1 capsule by mouth daily.     Marland Kitchen glucosamine-chondroitin 500-400 MG tablet Take 2 tablets by mouth daily.    Marland Kitchen glucose blood test strip 1 each by Other route 2 (two) times daily. Use as instructed 04/28/2016: ONE TOUCH VERIO  . lisinopril (PRINIVIL,ZESTRIL) 5 MG tablet Take 1 tablet (5  mg total) by mouth daily. Resume medication in 2 days   . loratadine (CLARITIN) 10 MG tablet Take 10 mg by mouth daily.   . metFORMIN (GLUCOPHAGE-XR) 500 MG 24 hr tablet TAKE TWO TABLETS BY MOUTH ONCE DAILY WITH BREAKFAST   . Multiple Vitamins-Minerals (MULTIVITAMIN WITH MINERALS) tablet Take 1 tablet by mouth daily.     . Omega-3 Fatty Acids (FISH OIL PO) Take 1,600 mg by mouth daily.   Glory Rosebush DELICA LANCETS 64P MISC by Does not apply route.   . [DISCONTINUED] simvastatin (ZOCOR) 20 MG tablet TAKE ONE TABLET BY MOUTH ONCE DAILY WITH BREAKFAST   . [DISCONTINUED] terazosin (HYTRIN) 5 MG capsule TAKE 1 CAPSULE BY MOUTH AT BEDTIME    No facility-administered encounter medications on file as of 01/29/2017.    Allergies  Allergen Reactions  . Penicillins Rash    Has patient had a PCN reaction causing immediate rash, facial/tongue/throat swelling, SOB or lightheadedness with hypotension: No Has patient had a PCN reaction causing severe rash involving mucus membranes or skin necrosis: No Has patient had a PCN reaction that required hospitalization: No Has patient had a PCN reaction occurring within the last 10 years: No If all of the above answers are "NO", then may proceed with Cephalosporin use.  ROS:  No fever, chills, headache, dizziness, chest pain, palpitations, shortness of breath, URI symptoms.  No further vomiting (once prior to admission).  Denies abdominal pain, urinary complaints, bowel changes, rashes, bleeding, bruising, mood changes or other concerns.   PHYSICAL EXAM:  BP 128/72   Pulse 60   Temp 97.6 F (36.4 C) (Oral)   Ht _0  (1.803 m)   Wt 196 lb 12.8 oz (89.3 kg)   BMI 27.45 kg/m   Well appearing, pleasant elderly male in no distress HEENT: PERRL, EOMI, conjunctiva and sclera are clear. OP clear, moist mucus membranes Neck: no lymphadenopathy or mass Heart: regular rate and rhythm Lungs: clear bilaterally Back: no spinal or CVA tenderness Abdomen: soft,  nontender, no mass Extremities: no edema Psych: normal mood, affect, hygiene and grooming Skin: normal turgor, no rash Neuro: alert and oriented, cranial nerves intact, normal gait  Urine dip was normal today   ASSESSMENT/PLAN:   AKI (acute kidney injury) ([place]) - recheck b-met today - Plan: Basic metabolic panel  Acute cystitis without hematuria - treated in hospital with IV fluids, ABX, discharged on Ceftin. Culture had no growth. Recheck urine today is normal. Complete ABX - Plan: POCT Urinalysis DIP (Proadvantage Device)  Type 2 diabetes mellitus with hypercholesterolemia ([place]) - continue current regimen

## 2017-01-29 ENCOUNTER — Ambulatory Visit (INDEPENDENT_AMBULATORY_CARE_PROVIDER_SITE_OTHER): Payer: Medicare Other | Admitting: Family Medicine

## 2017-01-29 ENCOUNTER — Encounter: Payer: Self-pay | Admitting: Family Medicine

## 2017-01-29 ENCOUNTER — Other Ambulatory Visit: Payer: Self-pay | Admitting: *Deleted

## 2017-01-29 VITALS — BP 128/72 | HR 60 | Temp 97.6°F | Ht 71.0 in | Wt 196.8 lb

## 2017-01-29 DIAGNOSIS — N3000 Acute cystitis without hematuria: Secondary | ICD-10-CM | POA: Diagnosis not present

## 2017-01-29 DIAGNOSIS — E1169 Type 2 diabetes mellitus with other specified complication: Secondary | ICD-10-CM | POA: Diagnosis not present

## 2017-01-29 DIAGNOSIS — E7800 Pure hypercholesterolemia, unspecified: Secondary | ICD-10-CM

## 2017-01-29 DIAGNOSIS — N179 Acute kidney failure, unspecified: Secondary | ICD-10-CM | POA: Diagnosis not present

## 2017-01-29 DIAGNOSIS — N400 Enlarged prostate without lower urinary tract symptoms: Secondary | ICD-10-CM

## 2017-01-29 LAB — POCT URINALYSIS DIP (PROADVANTAGE DEVICE)
Bilirubin, UA: NEGATIVE
Glucose, UA: NEGATIVE mg/dL
Ketones, POC UA: NEGATIVE mg/dL
Leukocytes, UA: NEGATIVE
NITRITE UA: NEGATIVE
PH UA: 6 (ref 5.0–8.0)
Protein Ur, POC: NEGATIVE mg/dL
RBC UA: NEGATIVE
Specific Gravity, Urine: 1.015
Urobilinogen, Ur: NEGATIVE

## 2017-01-29 MED ORDER — SIMVASTATIN 20 MG PO TABS: ORAL_TABLET | ORAL | 1 refills | Status: DC

## 2017-01-29 MED ORDER — TERAZOSIN HCL 5 MG PO CAPS: 5.0000 mg | ORAL_CAPSULE | Freq: Every day | ORAL | 1 refills | Status: DC

## 2017-01-30 LAB — BASIC METABOLIC PANEL
BUN: 23 mg/dL (ref 7–25)
CALCIUM: 9.2 mg/dL (ref 8.6–10.3)
CO2: 25 mmol/L (ref 20–32)
Chloride: 105 mmol/L (ref 98–110)
Creat: 1.07 mg/dL (ref 0.70–1.11)
Glucose, Bld: 109 mg/dL — ABNORMAL HIGH (ref 65–99)
POTASSIUM: 4.7 mmol/L (ref 3.5–5.3)
SODIUM: 138 mmol/L (ref 135–146)

## 2017-02-13 ENCOUNTER — Ambulatory Visit (INDEPENDENT_AMBULATORY_CARE_PROVIDER_SITE_OTHER): Payer: Medicare Other | Admitting: Family Medicine

## 2017-02-13 ENCOUNTER — Encounter: Payer: Self-pay | Admitting: Family Medicine

## 2017-02-13 VITALS — BP 120/60 | HR 69 | Temp 98.3°F | Resp 16 | Wt 197.2 lb

## 2017-02-13 DIAGNOSIS — N3001 Acute cystitis with hematuria: Secondary | ICD-10-CM | POA: Diagnosis not present

## 2017-02-13 DIAGNOSIS — R350 Frequency of micturition: Secondary | ICD-10-CM

## 2017-02-13 LAB — POCT URINALYSIS DIP (PROADVANTAGE DEVICE)
BILIRUBIN UA: NEGATIVE
GLUCOSE UA: NEGATIVE mg/dL
Ketones, POC UA: NEGATIVE mg/dL
NITRITE UA: POSITIVE — AB
Specific Gravity, Urine: 1.02
UUROB: NEGATIVE
pH, UA: 6 (ref 5.0–8.0)

## 2017-02-13 MED ORDER — SULFAMETHOXAZOLE-TRIMETHOPRIM 800-160 MG PO TABS: 1.0000 | ORAL_TABLET | Freq: Two times a day (BID) | ORAL | 0 refills | Status: DC

## 2017-02-13 NOTE — Progress Notes (Signed)
   Subjective:    Patient ID: Andre Walker, male    DOB: 06/29/1932, 83 y.o.   MRN: [id number]  HPI He complains of a 6-day history of polyuria, urgency but no fever, chills, abdominal or back pain.  He was recently in the hospital and treated for a UTI.  He was placed on a cephalosporin.  Review of the record indicates the culture was negative.   Review of Systems     Objective:   Physical Exam Alert and in no distress.  Abdominal exam shows no masses or tenderness.  Urine microscopic was loaded with bacteria and red cells as well as white cells.       Assessment & Plan:  Urinary frequency - Plan: POCT Urinalysis DIP (Proadvantage Device)  Acute cystitis with hematuria - Plan: sulfamethoxazole-trimethoprim (BACTRIM DS,SEPTRA DS) 800-160 MG tablet, CULTURE, URINE COMPREHENSIVE  Also recommend he use Azo-Standard to help with his symptoms.  Recommend he return here in 2 weeks for a recheck and consider referral to urology.

## 2017-02-13 NOTE — Patient Instructions (Signed)
Drink plenty of fluids but also get Azo-Standard

## 2017-02-16 LAB — CULTURE, URINE COMPREHENSIVE
MICRO NUMBER: 81440309
SPECIMEN QUALITY:: ADEQUATE

## 2017-02-23 ENCOUNTER — Encounter: Payer: Self-pay | Admitting: Family Medicine

## 2017-02-23 ENCOUNTER — Ambulatory Visit (INDEPENDENT_AMBULATORY_CARE_PROVIDER_SITE_OTHER): Payer: Medicare Other | Admitting: Family Medicine

## 2017-02-23 VITALS — BP 120/68 | HR 72 | Temp 97.7°F | Ht 71.0 in | Wt 195.8 lb

## 2017-02-23 DIAGNOSIS — R300 Dysuria: Secondary | ICD-10-CM | POA: Diagnosis not present

## 2017-02-23 DIAGNOSIS — N400 Enlarged prostate without lower urinary tract symptoms: Secondary | ICD-10-CM | POA: Diagnosis not present

## 2017-02-23 DIAGNOSIS — N390 Urinary tract infection, site not specified: Secondary | ICD-10-CM | POA: Diagnosis not present

## 2017-02-23 LAB — POCT URINALYSIS DIP (PROADVANTAGE DEVICE)
Bilirubin, UA: NEGATIVE
Blood, UA: NEGATIVE
Glucose, UA: NEGATIVE mg/dL
Ketones, POC UA: NEGATIVE mg/dL
LEUKOCYTES UA: NEGATIVE
NITRITE UA: NEGATIVE
PROTEIN UA: NEGATIVE mg/dL
Specific Gravity, Urine: 1.015
UUROB: NEGATIVE
pH, UA: 6 (ref 5.0–8.0)

## 2017-02-23 NOTE — Patient Instructions (Signed)
  We are going to refer you to [facility] for consultation regarding your recent urinary tract infections (two this month) and enlarged prostate.  If you develop any recurrent urinary symptoms prior to your appointment there, please come here right away.  Your urine today looks completely clear, infection has resolved.

## 2017-02-23 NOTE — Progress Notes (Signed)
Chief Complaint  Patient presents with  . Follow-up    on UTI, is feeling better-took last abx last night. Did UA today and it was negative.     He was seen by Dr. Redmond School 12/21 and diagnosed with UTI.  Urine culture showed e.coli, sensitive to the  Bactrim DS that he was prescribed.  He took the last pill last night, and his symptoms completely resolved.  He had been having urinary frequency and dysuria.    Gets up 2-3 times times at night to void.  No frequency during the day.  Dysuria and urgency/frequency noted at last visit has resolved. Hasn't seen urologist in a long time. He has BPH and takes Hytrin. He denies urinary retention or other urinary complaints, except as noted  PMH, PSH, SH reviewed  Outpatient Encounter Medications as of 02/23/2017  Medication Sig Note  . aspirin 81 MG tablet Take 81 mg by mouth daily.     . Calcium Carbonate-Vitamin D (CALCIUM 600+D HIGH POTENCY) 600-400 MG-UNIT per tablet Take 1 tablet by mouth 2 (two) times daily.    . Coenzyme Q10 (COQ10) 100 MG CAPS Take 1 capsule by mouth daily.     Marland Kitchen glucosamine-chondroitin 500-400 MG tablet Take 2 tablets by mouth daily.    Marland Kitchen glucose blood test strip 1 each by Other route 2 (two) times daily. Use as instructed 04/28/2016: ONE TOUCH VERIO  . lisinopril (PRINIVIL,ZESTRIL) 5 MG tablet Take 1 tablet (5 mg total) by mouth daily. Resume medication in 2 days   . loratadine (CLARITIN) 10 MG tablet Take 10 mg by mouth daily.   . metFORMIN (GLUCOPHAGE-XR) 500 MG 24 hr tablet TAKE TWO TABLETS BY MOUTH ONCE DAILY WITH BREAKFAST   . Multiple Vitamins-Minerals (MULTIVITAMIN WITH MINERALS) tablet Take 1 tablet by mouth daily.     . Omega-3 Fatty Acids (FISH OIL PO) Take 1,600 mg by mouth daily.   Glory Rosebush DELICA LANCETS 01U MISC by Does not apply route.   . simvastatin (ZOCOR) 20 MG tablet TAKE ONE TABLET BY MOUTH ONCE DAILY WITH BREAKFAST   . terazosin (HYTRIN) 5 MG capsule Take 1 capsule (5 mg total) by mouth at bedtime.    . [DISCONTINUED] sulfamethoxazole-trimethoprim (BACTRIM DS,SEPTRA DS) 800-160 MG tablet Take 1 tablet by mouth 2 (two) times daily.    No facility-administered encounter medications on file as of 02/23/2017.    Allergies  Allergen Reactions  . Penicillins Rash    Has patient had a PCN reaction causing immediate rash, facial/tongue/throat swelling, SOB or lightheadedness with hypotension: No Has patient had a PCN reaction causing severe rash involving mucus membranes or skin necrosis: No Has patient had a PCN reaction that required hospitalization: No Has patient had a PCN reaction occurring within the last 10 years: No If all of the above answers are "NO", then may proceed with Cephalosporin use.    ROS:  No fever, chills, nausea, vomiting, diarrhea, bleeding, bruising, rashes. No URI symptoms, chest pain, cough, shortness of breath, edema. +mild nocturia, per HPI.  Moods are good.  Getting fitted for hearing aids through the [facility]:  BP 120/68   Pulse 72   Temp 97.7 F (36.5 C) (Tympanic)   Ht 5\' 11"  (1.803 m)   Wt 195 lb 12.8 oz (88.8 kg)   BMI 27.31 kg/m   Well developed pleasant male in no distress HEENT: conjunctiva and sclera are clear, EOMI Neck: no lymphadenopathy or mass Heart: regular rate and rhythm Lungs: clear bilaterally  Back: no CVA tenderness Abdomen: soft, nontender, no mass  Urine dip today--normal  ASSESSMENT/PLAN:  Frequent UTI - in male pt with BPH.  2 infections within a month, with normal u/a between episodes. Refer to urology for consult (? elevated PVR a risk?) - Plan: Ambulatory referral to Urology  Benign prostatic hyperplasia, unspecified whether lower urinary tract symptoms present - on Hytrin, and symptoms are pretty well controlled - Plan: Ambulatory referral to Urology  Dysuria - resolved s/p course of trimeth-sulfa - Plan: POCT Urinalysis DIP (Proadvantage Device)   We are going to refer you to [facility] for  consultation regarding your recent urinary tract infections (two this month) and enlarged prostate.  If you develop any recurrent urinary symptoms prior to your appointment there, please come here right away.  Your urine today looks completely clear, infection has resolved.

## 2017-02-27 ENCOUNTER — Other Ambulatory Visit: Payer: Self-pay | Admitting: Family Medicine

## 2017-02-27 DIAGNOSIS — E119 Type 2 diabetes mellitus without complications: Secondary | ICD-10-CM

## 2017-04-09 ENCOUNTER — Telehealth: Payer: Self-pay | Admitting: Family Medicine

## 2017-04-09 NOTE — Telephone Encounter (Signed)
What Beverlee Nims said is correct.  We do not prescribe medications for cough without evaluating (to ensure there is no infection, vs other reasons for cough--allergies, reflux, etc).  OTC recommendations include Mucinex DM

## 2017-04-09 NOTE — Telephone Encounter (Signed)
This was sent to me.

## 2017-04-09 NOTE — Telephone Encounter (Signed)
Patient advised.

## 2017-04-09 NOTE — Telephone Encounter (Signed)
Pt said he has a really bad cough again same as last year and you gave him a cough med that worked really well. He states Venzonatate.  I advised we do not call out meds if we haven't seen you recently and he said you would. Pt ph [phone]   [facility]  [employer].

## 2017-04-13 ENCOUNTER — Encounter: Payer: Self-pay | Admitting: Family Medicine

## 2017-04-13 ENCOUNTER — Ambulatory Visit (INDEPENDENT_AMBULATORY_CARE_PROVIDER_SITE_OTHER): Payer: Medicare Other | Admitting: Family Medicine

## 2017-04-13 DIAGNOSIS — R059 Cough, unspecified: Secondary | ICD-10-CM

## 2017-04-13 DIAGNOSIS — R05 Cough: Secondary | ICD-10-CM | POA: Diagnosis not present

## 2017-04-13 MED ORDER — BENZONATATE 200 MG PO CAPS: 200.0000 mg | ORAL_CAPSULE | Freq: Three times a day (TID) | ORAL | 0 refills | Status: DC | PRN

## 2017-04-13 NOTE — Patient Instructions (Signed)
  Drink plenty of water. Use the tessalon/benzonatate as needed for cough. If you have any chest congestion or thick mucus, restart the mucinex (guaifenesin, expectorant to break up the mucus/phlegm).  Return if you develop fever, shortness of breath, pain with breathing or other new symptoms.  I suspect this is related to a recent virus, as opposed to when you usually come in April where it is felt to be related to allergies.  Continue your allergy medication.

## 2017-04-13 NOTE — Progress Notes (Signed)
Chief Complaint  Patient presents with  . Cough    off and on x 2 weeks. Started night of [employer]. No body aches,chills or fever. Doesn't bring up much mucus, very dry. Tried mucinex and delsym and not helping.    He gets a cold every year this time of year, and gets a persistent cough.  He had a cold in early February (sore throat, then cough).  Throat is better. Never had a lot of nasal congestion, ear pain, sinus pain.  He just has cough.  Cough is usually dry, only occasionally gets up phlegm (slightly yellow-green), maybe just once a day.  Denies chest congestion, shortness of breath, chest pain.  He has been taking Delsym syrup since Friday, and prior to that was taking Mucinex DM.  No other OTC medications.  He is asking for benzonatate, as this has worked in the past.  (chart reviewed--he usually comes in in April for his cough, possible allergy component).   Saw urologist in January, who added a new medication (didn't bring it with him). Notes are not yet scanned into Epic  PMH, PSH, SH reviewed  Outpatient Encounter Medications as of 04/13/2017  Medication Sig Note  . aspirin 81 MG tablet Take 81 mg by mouth daily.     . Calcium Carbonate-Vitamin D (CALCIUM 600+D HIGH POTENCY) 600-400 MG-UNIT per tablet Take 1 tablet by mouth 2 (two) times daily.    . Coenzyme Q10 (COQ10) 100 MG CAPS Take 1 capsule by mouth daily.     Marland Kitchen Dextromethorphan Polistirex (DELSYM PO) Take 10 mLs by mouth every 12 (twelve) hours.   Marland Kitchen glucosamine-chondroitin 500-400 MG tablet Take 2 tablets by mouth daily.    Marland Kitchen glucose blood test strip 1 each by Other route 2 (two) times daily. Use as instructed 04/28/2016: ONE TOUCH VERIO  . lisinopril (PRINIVIL,ZESTRIL) 5 MG tablet Take 1 tablet (5 mg total) by mouth daily. Resume medication in 2 days   . loratadine (CLARITIN) 10 MG tablet Take 10 mg by mouth daily.   . metFORMIN (GLUCOPHAGE-XR) 500 MG 24 hr tablet TAKE TWO TABLETS BY MOUTH ONCE DAILY WITH BREAKFAST    . Multiple Vitamins-Minerals (MULTIVITAMIN WITH MINERALS) tablet Take 1 tablet by mouth daily.     . Omega-3 Fatty Acids (FISH OIL PO) Take 1,600 mg by mouth daily.   Glory Rosebush DELICA LANCETS 42H MISC by Does not apply route.   . simvastatin (ZOCOR) 20 MG tablet TAKE ONE TABLET BY MOUTH ONCE DAILY WITH BREAKFAST   . terazosin (HYTRIN) 5 MG capsule Take 1 capsule (5 mg total) by mouth at bedtime.   . [DISCONTINUED] lisinopril (PRINIVIL,ZESTRIL) 5 MG tablet TAKE 1 TABLET BY MOUTH ONCE DAILY    No facility-administered encounter medications on file as of 04/13/2017.    Taking another medication from urologist as well.  Allergies  Allergen Reactions  . Penicillins Rash    Has patient had a PCN reaction causing immediate rash, facial/tongue/throat swelling, SOB or lightheadedness with hypotension: No Has patient had a PCN reaction causing severe rash involving mucus membranes or skin necrosis: No Has patient had a PCN reaction that required hospitalization: No Has patient had a PCN reaction occurring within the last 10 years: No If all of the above answers are "NO", then may proceed with Cephalosporin use.    ROS: no fever, chills, chest pain, shortness of breath, headache, dizziness, GI or GU complaints, bleeding, bruising, rash, myalgias or other complaints. See HPI  PHYSICAL EXAM:  BP  118/68   Pulse 60   Temp (!) 97.3 F (36.3 C) (Tympanic)   Ht 5\' 11"  (1.803 m)   Wt 194 lb 3.2 oz (88.1 kg)   BMI 27.09 kg/m    Well appearing, pleasant male, with only sporadic dry cough, in no distress. Speaking easily in full sentences HEENT: PERRL, EOMI, conjunctiva and sclera are clear. TM's and EAC's normal. Nasal mucosa is mildly edematous on the left with clear mucus, normal on the right. Sinuses are nontender. OP is clear Neck: no lymphadenopathy or mass Heart: bradycardic, no murmur Lungs: clear bilaterally. No wheezes, rales, ronchi Skin: normal turgor, no rash Neuro: alert and  oriented, cranial nerves intact, normal gait   ASSESSMENT/PLAN:  Cough - Plan: benzonatate (TESSALON) 200 MG capsule  Postviral cough No evidence of bacterial infection. Suspect some postnasal drainage. Continue current allergy regimen. Use tessalon prn.   Drink plenty of water. Use the tessalon/benzonatate as needed for cough. If you have any chest congestion or thick mucus, restart the mucinex (guaifenesin, expectorant to break up the mucus/phlegm).  Return if you develop fever, shortness of breath, pain with breathing or other new symptoms.  I suspect this is related to a recent virus, as opposed to when you usually come in April where it is felt to be related to allergies.  Continue your allergy medication.

## 2017-05-27 ENCOUNTER — Other Ambulatory Visit: Payer: Self-pay | Admitting: Family Medicine

## 2017-05-27 DIAGNOSIS — E119 Type 2 diabetes mellitus without complications: Secondary | ICD-10-CM

## 2017-06-03 ENCOUNTER — Other Ambulatory Visit: Payer: Medicare Other

## 2017-06-03 DIAGNOSIS — Z5181 Encounter for therapeutic drug level monitoring: Secondary | ICD-10-CM

## 2017-06-03 DIAGNOSIS — E119 Type 2 diabetes mellitus without complications: Secondary | ICD-10-CM

## 2017-06-03 NOTE — Addendum Note (Signed)
Addended by: Carolee Rota F on: 06/03/2017 08:25 AM   Modules accepted: Orders

## 2017-06-04 LAB — CBC WITH DIFFERENTIAL/PLATELET
BASOS ABS: 0 10*3/uL (ref 0.0–0.2)
Basos: 0 %
EOS (ABSOLUTE): 0.3 10*3/uL (ref 0.0–0.4)
Eos: 4 %
Hematocrit: 40 % (ref 37.5–51.0)
Hemoglobin: 13.6 g/dL (ref 13.0–17.7)
IMMATURE GRANS (ABS): 0 10*3/uL (ref 0.0–0.1)
Immature Granulocytes: 0 %
LYMPHS ABS: 2.3 10*3/uL (ref 0.7–3.1)
LYMPHS: 30 %
MCH: 32.1 pg (ref 26.6–33.0)
MCHC: 34 g/dL (ref 31.5–35.7)
MCV: 94 fL (ref 79–97)
Monocytes Absolute: 0.4 10*3/uL (ref 0.1–0.9)
Monocytes: 5 %
NEUTROS ABS: 4.8 10*3/uL (ref 1.4–7.0)
Neutrophils: 61 %
PLATELETS: 253 10*3/uL (ref 150–379)
RBC: 4.24 x10E6/uL (ref 4.14–5.80)
RDW: 13.4 % (ref 12.3–15.4)
WBC: 7.8 10*3/uL (ref 3.4–10.8)

## 2017-06-04 LAB — HEPATIC FUNCTION PANEL
ALK PHOS: 97 IU/L (ref 39–117)
ALT: 24 IU/L (ref 0–44)
AST: 20 IU/L (ref 0–40)
Albumin: 4.2 g/dL (ref 3.5–4.7)
BILIRUBIN, DIRECT: 0.21 mg/dL (ref 0.00–0.40)
Bilirubin Total: 0.7 mg/dL (ref 0.0–1.2)
TOTAL PROTEIN: 6.6 g/dL (ref 6.0–8.5)

## 2017-06-04 LAB — HEMOGLOBIN A1C
ESTIMATED AVERAGE GLUCOSE: 143 mg/dL
HEMOGLOBIN A1C: 6.6 % — AB (ref 4.8–5.6)

## 2017-06-04 LAB — GLUCOSE, RANDOM: GLUCOSE: 135 mg/dL — AB (ref 65–99)

## 2017-06-07 NOTE — Progress Notes (Signed)
Chief Complaint  Patient presents with  . Diabetes    nonfasting med check, labs already done.     BPH and UTI in 01/2017. He was referred to [facility], and saw them the end of January. He had normal PVR.  He didn't think further w/u was needed. Due to some obstructive symptoms, finasteride was added to his hytrin. He denies any side effects.  He gets up a little less often at night (varies between 0-3 times/night).  He will see them again for a 6 month follow-up this summer.  Denies dysuria, abdominal pain, urinary frequency.  Diabetes follow-up: Blood sugars at home are running 120-150.  They changed the food service at [facility]. It is buffet, and he thinks they now serve more carbs/startches.  Denies any changes to what he eats at home, continues to exercise regularly. Denies hypoglycemia. Denies polydipsia and polyuria. Last eye exam was in July, 2018, no retinopathy. Patient follows a low sugar diet and checks feet regularly without concerns. Denies numbness, tingling, burning or sores.  Hyperlipidemia follow-up: Patient is reportedly following a low-fat, low cholesterol diet. Compliant with medications and denies medication side effects. He had some aches when originally changed from Zocor to the generic, but coenzyme Q10 resolved those symptoms. He has been on this regimen now for a few years, and denies any problems or side effects.  Constipation:  No further issues with constipation--eats high fiber diet, and no longer having problems with hard stools like he was 6 months ago.  PMH, PSH, SH reviewed  Outpatient Encounter Medications as of 06/08/2017  Medication Sig Note  . aspirin 81 MG tablet Take 81 mg by mouth daily.     . Calcium Carbonate-Vitamin D (CALCIUM 600+D HIGH POTENCY) 600-400 MG-UNIT per tablet Take 1 tablet by mouth 2 (two) times daily.    . Coenzyme Q10 (COQ10) 100 MG CAPS Take 1 capsule by mouth daily.     . finasteride (PROSCAR) 5 MG tablet    .  glucosamine-chondroitin 500-400 MG tablet Take 2 tablets by mouth daily.    Marland Kitchen glucose blood test strip 1 each by Other route 2 (two) times daily. Use as instructed 04/28/2016: ONE TOUCH VERIO  . lisinopril (PRINIVIL,ZESTRIL) 5 MG tablet TAKE 1 TABLET BY MOUTH ONCE DAILY   . loratadine (CLARITIN) 10 MG tablet Take 10 mg by mouth daily.   . metFORMIN (GLUCOPHAGE-XR) 500 MG 24 hr tablet TAKE TWO TABLETS BY MOUTH ONCE DAILY WITH BREAKFAST   . Multiple Vitamins-Minerals (MULTIVITAMIN WITH MINERALS) tablet Take 1 tablet by mouth daily.     . Omega-3 Fatty Acids (FISH OIL PO) Take 1,600 mg by mouth daily.   Glory Rosebush DELICA LANCETS 16W MISC by Does not apply route.   . simvastatin (ZOCOR) 20 MG tablet TAKE ONE TABLET BY MOUTH ONCE DAILY WITH BREAKFAST   . terazosin (HYTRIN) 5 MG capsule Take 1 capsule (5 mg total) by mouth at bedtime.   . [DISCONTINUED] benzonatate (TESSALON) 200 MG capsule Take 1 capsule (200 mg total) by mouth 3 (three) times daily as needed.   . [DISCONTINUED] Dextromethorphan Polistirex (DELSYM PO) Take 10 mLs by mouth every 12 (twelve) hours.   . [DISCONTINUED] lisinopril (PRINIVIL,ZESTRIL) 5 MG tablet Take 1 tablet (5 mg total) by mouth daily. Resume medication in 2 days    No facility-administered encounter medications on file as of 06/08/2017.    Allergies  Allergen Reactions  . Penicillins Rash    Has patient had a PCN reaction causing  immediate rash, facial/tongue/throat swelling, SOB or lightheadedness with hypotension: No Has patient had a PCN reaction causing severe rash involving mucus membranes or skin necrosis: No Has patient had a PCN reaction that required hospitalization: No Has patient had a PCN reaction occurring within the last 10 years: No If all of the above answers are "NO", then may proceed with Cephalosporin use.    ROS:  Denies fever, chills, URI symptoms, headaches, dizziness, chest pain, GI or GU complaints, bleeding, bruising, rash, depression or any  other complaints.   PHYSICAL EXAM:  BP 120/70   Pulse 68   Ht 5' 10.5" (1.791 m)   Wt 197 lb (89.4 kg)   BMI 27.87 kg/m   Wt Readings from Last 3 Encounters:  06/08/17 197 lb (89.4 kg)  04/13/17 194 lb 3.2 oz (88.1 kg)  02/23/17 195 lb 12.8 oz (88.8 kg)   Well developed, pleasant male in no distress  HEENT: PERRL EOMI, conjunctiva and sclera are clear.  OP clear. Has hearing aids bilaterally. L TM and EAC was normal (pt asked for it to be checked) Neck: no lymphadenopathy, thyromegaly or carotid bruit  Heart: regular rate and rhythm without murmur  Lungs: clear bilaterally  Abdomen: soft, nontender, no mass Back: no CVA tenderness Extremities: no edema, 2+ pulse  Skin: Extensive actinic damage to both forearms, also AK on legs. No rashes Psych: normal mood, affect, hygiene and grooming  Neuro: alert and oriented, normal gait, strength, sensation.   Lab Results  Component Value Date   HGBA1C 6.6 (H) 06/03/2017   Fasting glucose 135  Lab Results  Component Value Date   ALT 24 06/03/2017   AST 20 06/03/2017   ALKPHOS 97 06/03/2017   BILITOT 0.7 06/03/2017   Lab Results  Component Value Date   WBC 7.8 06/03/2017   HGB 13.6 06/03/2017   HCT 40.0 06/03/2017   MCV 94 06/03/2017   PLT 253 06/03/2017    ASSESSMENT/PLAN:   Type 2 diabetes mellitus with hypercholesterolemia ([facility]) - Sugars running higher than in past; overall still controlled. Diet/portions reviewed in detail. Cont metformin at same dose - Plan: Comprehensive metabolic panel, Microalbumin / creatinine urine ratio, TSH, Hemoglobin A1c, Lipid panel  Diabetes mellitus without complication ([facility]) - controlled - Plan: metFORMIN (GLUCOPHAGE-XR) 500 MG 24 hr tablet  Benign prostatic hyperplasia, unspecified whether lower urinary tract symptoms present - now on hytrin and proscar, and doing well. no recurrent UTI symptoms  Pure hypercholesterolemia - continue statin, lowfat and low cholesterol diet -  Plan: Lipid panel  F/u 6 mos AWV/med check with fasting labs prior c-met, lipids, TSH, urine microalb, A1c

## 2017-06-08 ENCOUNTER — Ambulatory Visit (INDEPENDENT_AMBULATORY_CARE_PROVIDER_SITE_OTHER): Payer: Medicare Other | Admitting: Family Medicine

## 2017-06-08 ENCOUNTER — Encounter: Payer: Self-pay | Admitting: Family Medicine

## 2017-06-08 VITALS — BP 120/70 | HR 68 | Ht 70.5 in | Wt 197.0 lb

## 2017-06-08 DIAGNOSIS — E119 Type 2 diabetes mellitus without complications: Secondary | ICD-10-CM

## 2017-06-08 DIAGNOSIS — E1169 Type 2 diabetes mellitus with other specified complication: Secondary | ICD-10-CM

## 2017-06-08 DIAGNOSIS — E7800 Pure hypercholesterolemia, unspecified: Secondary | ICD-10-CM

## 2017-06-08 DIAGNOSIS — N400 Enlarged prostate without lower urinary tract symptoms: Secondary | ICD-10-CM | POA: Diagnosis not present

## 2017-06-08 MED ORDER — METFORMIN HCL ER 500 MG PO TB24: ORAL_TABLET | ORAL | 1 refills | Status: DC

## 2017-06-08 NOTE — Patient Instructions (Signed)
Your sugars are running higher than on your previous visits. Your diabetes is still considered controlled.  Try and cut back on the starches/carbs in your meals (portion sizes, limit to 1/4 plate for all starches, see below).   If you sugars continue to rise, we may need to adjust your medications. Goal is <130-140 for mornings, ideally <120.    MyPlate from USDA The general, healthful diet is based on the 2010 Dietary Guidelines for Americans. The amount of food you need to eat from each food group depends on your age, sex, and level of physical activity and can be individualized by a dietitian. Go to [email or web address] for more information. What do I need to know about the MyPlate plan?  Enjoy your food, but eat less.  Avoid oversized portions. ?  of your plate should include fruits and vegetables. ?  of your plate should be grains. ?  of your plate should be protein. Grains  Make at least half of your grains whole grains.  For a 2,000 calorie daily food plan, eat 6 oz every day.  1 oz is about 1 slice bread, 1 cup cereal, or  cup cooked rice, cereal, or pasta. Vegetables  Make half your plate fruits and vegetables.  For a 2,000 calorie daily food plan, eat 2 cups every day.  1 cup is about 1 cup raw or cooked vegetables or vegetable juice or 2 cups raw leafy greens. Fruits  Make half your plate fruits and vegetables.  For a 2,000 calorie daily food plan, eat 2 cups every day.  1 cup is about 1 cup fruit or 100% fruit juice or  cup dried fruit. Protein  For a 2,000 calorie daily food plan, eat 5 oz every day.  1 oz is about 1 oz meat, poultry, or fish,  cup cooked beans, 1 egg, 1 Tbsp peanut butter, or  oz nuts or seeds. Dairy  Switch to fat-free or low-fat (1%) milk.  For a 2,000 calorie daily food plan, eat 3 cups every day.  1 cup is about 1 cup milk or yogurt or soy milk (soy beverage), 1 oz natural cheese, or 2 oz processed cheese. Fats, Oils, and  Empty Calories  Only small amounts of oils are recommended.  Empty calories are calories from solid fats or added sugars.  Compare sodium in foods like soup, bread, and frozen meals. Choose the foods with lower numbers.  Drink water instead of sugary drinks. What foods can I eat? Grains Whole grains such as whole wheat, quinoa, millet, and bulgur. Bread, rolls, and pasta made from whole grains. Brown or wild rice. Hot or cold cereals made from whole grains and without added sugar. Vegetables All fresh vegetables, especially fresh red, dark green, or orange vegetables. Peas and beans. Low-sodium frozen or canned vegetables prepared without added salt. Low-sodium vegetable juices. Fruits All fresh, frozen, and dried fruits. Canned fruit packed in water or fruit juice without added sugar. Fruit juices without added sugar. Meats and Other Protein Sources Boiled, baked, or grilled lean meat trimmed of fat. Skinless poultry. Fresh seafood and shellfish. Canned seafood packed in water. Unsalted nuts and unsalted nut butters. Tofu. Dried beans and pea. Eggs. Dairy Low-fat or fat-free milk, yogurt, and cheeses. Sweets and Desserts Frozen desserts made from low-fat milk. Fats and Oils Olive, peanut, and canola oils and margarine. Salad dressing and mayonnaise made from these oils. Other Soups and casseroles made from allowed ingredients and without added fat or salt. The items listed  above may not be a complete list of recommended foods or beverages. Contact your dietitian for more options. What foods are not recommended? Grains Sweetened, low-fiber cereals. Packaged baked goods. Snack crackers and chips. Cheese crackers, butter crackers, and biscuits. Frozen waffles, sweet breads, doughnuts, pastries, packaged baking mixes, pancakes, cakes, and cookies. Vegetables Regular canned or frozen vegetables or vegetables prepared with salt. Canned tomatoes. Canned tomato sauce. Fried vegetables.  Vegetables in cream sauce or cheese sauce. Fruits Fruits packed in syrup or made with added sugar. Meats and Other Protein Sources Marbled or fatty meats such as ribs. Poultry with skin. Fried meats, poultry, eggs, or fish. Sausages, hot dogs, and deli meats such as pastrami, bologna, or salami. Dairy Whole milk, cream, cheeses made from whole milk, sour cream. Ice cream or yogurt made from whole milk or with added sugar. Beverages For adults, no more than one alcoholic drink per day. Regular soft drinks or other sugary beverages. Juice drinks. Sweets and Desserts Sugary or fatty desserts, candy, and other sweets. Fats and Oils Solid shortening or partially hydrogenated oils. Solid margarine. Margarine that contains trans fats. Butter. The items listed above may not be a complete list of foods and beverages to avoid. Contact your dietitian for more information. This information is not intended to replace advice given to you by your health care provider. Make sure you discuss any questions you have with your health care provider. Document Released: 03/02/2007 Document Revised: 07/19/2015 Document Reviewed: 01/19/2013 Elsevier Interactive Patient Education  [employer].

## 2017-07-22 ENCOUNTER — Other Ambulatory Visit: Payer: Self-pay | Admitting: Family Medicine

## 2017-07-22 DIAGNOSIS — N400 Enlarged prostate without lower urinary tract symptoms: Secondary | ICD-10-CM

## 2017-08-18 ENCOUNTER — Other Ambulatory Visit: Payer: Self-pay | Admitting: Family Medicine

## 2017-08-18 DIAGNOSIS — E119 Type 2 diabetes mellitus without complications: Secondary | ICD-10-CM

## 2017-08-18 DIAGNOSIS — E7800 Pure hypercholesterolemia, unspecified: Secondary | ICD-10-CM

## 2017-09-15 LAB — HM DIABETES EYE EXAM

## 2017-10-02 ENCOUNTER — Encounter: Payer: Self-pay | Admitting: Family Medicine

## 2017-12-07 ENCOUNTER — Other Ambulatory Visit (INDEPENDENT_AMBULATORY_CARE_PROVIDER_SITE_OTHER): Payer: Medicare Other

## 2017-12-07 DIAGNOSIS — E7800 Pure hypercholesterolemia, unspecified: Secondary | ICD-10-CM

## 2017-12-07 DIAGNOSIS — Z23 Encounter for immunization: Secondary | ICD-10-CM

## 2017-12-07 DIAGNOSIS — E1169 Type 2 diabetes mellitus with other specified complication: Secondary | ICD-10-CM

## 2017-12-08 LAB — LIPID PANEL
CHOL/HDL RATIO: 3.1 ratio (ref 0.0–5.0)
Cholesterol, Total: 120 mg/dL (ref 100–199)
HDL: 39 mg/dL — AB (ref >39)
LDL CALC: 62 mg/dL (ref 0–99)
TRIGLYCERIDES: 94 mg/dL (ref 0–149)
VLDL Cholesterol Cal: 19 mg/dL (ref 5–40)

## 2017-12-08 LAB — COMPREHENSIVE METABOLIC PANEL
ALT: 24 IU/L (ref 0–44)
AST: 21 IU/L (ref 0–40)
Albumin/Globulin Ratio: 1.8 (ref 1.2–2.2)
Albumin: 4.2 g/dL (ref 3.5–4.7)
Alkaline Phosphatase: 107 IU/L (ref 39–117)
BUN/Creatinine Ratio: 26 — ABNORMAL HIGH (ref 10–24)
BUN: 27 mg/dL (ref 8–27)
Bilirubin Total: 0.3 mg/dL (ref 0.0–1.2)
CHLORIDE: 105 mmol/L (ref 96–106)
CO2: 23 mmol/L (ref 20–29)
Calcium: 9.5 mg/dL (ref 8.6–10.2)
Creatinine, Ser: 1.04 mg/dL (ref 0.76–1.27)
GFR, EST AFRICAN AMERICAN: 76 mL/min/{1.73_m2} (ref >59)
GFR, EST NON AFRICAN AMERICAN: 66 mL/min/{1.73_m2} (ref >59)
GLUCOSE: 132 mg/dL — AB (ref 65–99)
Globulin, Total: 2.4 g/dL (ref 1.5–4.5)
Potassium: 4.5 mmol/L (ref 3.5–5.2)
Sodium: 143 mmol/L (ref 134–144)
Total Protein: 6.6 g/dL (ref 6.0–8.5)

## 2017-12-08 LAB — MICROALBUMIN / CREATININE URINE RATIO
Creatinine, Urine: 86.8 mg/dL
Microalb/Creat Ratio: 26.8 mg/g creat (ref 0.0–30.0)
Microalbumin, Urine: 23.3 ug/mL

## 2017-12-08 LAB — HEMOGLOBIN A1C
Est. average glucose Bld gHb Est-mCnc: 140 mg/dL
Hgb A1c MFr Bld: 6.5 % — ABNORMAL HIGH (ref 4.8–5.6)

## 2017-12-08 LAB — TSH: TSH: 6.36 u[IU]/mL — ABNORMAL HIGH (ref 0.450–4.500)

## 2017-12-08 NOTE — Progress Notes (Signed)
Chief Complaint  Patient presents with  . Medicare Wellness    nonfasting AWV, labs already done. No new concerns. Did have both Shingrix at [facility] [place] will call them for dates.     Andre Walker is a 84 y.o. male who presents for annual wellness visit and follow-up on chronic medical conditions.  He has the following concerns:  Diabetes follow-up: Blood sugars at home are running 100-130 in the mornings, doesn't check other times of the day. Denies hypoglycemia. Denies polydipsia and polyuria. Last eye exam was in July, 2019, no retinopathy. Patient follows a low sugar diet and checks feet regularly without concerns. Denies numbness, tingling, burning or sores. Has thickened fungal toenails, denies any pain/discomfort.  Hyperlipidemia follow-up: Patient is reportedly following a low-fat, low cholesterol diet. Compliant with medications and denies medication side effects. He had some aches when originally changed from Zocor to the generic, but coenzyme Q10 resolved those symptoms. He has been on this regimen now for a few years, and denies any problems or side effects.  BPH:  Under the care of [facility] (since getting admitted 01/2017, had UTI, AKI). He had normal PVR in 02/2017.  Due to some obstructive symptoms, finasteride was added to his hytrin. He denies any side effects. He gets up a little less often at night (once or twice a night). He last saw urologist in July. Normal stream, empties bladder well.  He has had some borderline and mildly elevated TSH values in the past, never symptomatic. He denies fatigue, weight gain, cold intolerance, hair/skin changes.  Weight is up a little from [place] lives, otherwise denies any thyroid symptoms. He had labs done prior to today's visit, see below.   Immunization History  Administered Date(s) Administered  . Influenza Split 12/02/2010, 12/03/2011  . Influenza, High Dose Seasonal PF 12/01/2012, 12/12/2013, 12/06/2014,  11/13/2015, 11/26/2016, 12/07/2017  . Pneumococcal Conjugate-13 09/12/2013  . Pneumococcal Polysaccharide-23 12/25/2004, 06/04/2011, 12/03/2016  . Td 04/24/2004  . Tdap 12/02/2010  . Zoster 10/23/2007  . Zoster Recombinat (Shingrix) 10/01/2017  reports he also had 2nd Shingrix through [facility]) Last colonoscopy: 12/2015, showing int/external hemorrhoids, hyperplastic polyp and diverticulosis Last PSA: no longer being screened Lab Results  Component Value Date   PSA 2.2 05/06/2016   PSA 3.16 04/04/2015   PSA 2.25 03/22/2014   Dentist: twice yearly Ophtho: yearly, last in July Exercise: Going to the gym (classes and [place] at least 3 days per week, and walks daily, at least 30 minutes. He usually plays golf once a week.  Other doctors caring for patient include: Dr. Hassell Done (general surgeon, did melanoma excision, no regular f/u)  Dr. Pearline Cables (since Dr. Allyson Sabal retired--dermatologist) --sees every 6 months Dr. Katy Fitch (ophtho) --yearly Dr. Lavone Neri (dentist) --twice yearly Dr. Watt Climes (GI)  [facility], didn't [facility] for a year, back to qualifying, but hasn't been back yet.  Got hearing aids fixed at [facility].  Depression screen:  Negative Functional status survey: unremarkable (hearing aids were fixed) Fall screen: negative. Mini-Cog screen: normal (score of 5)  End of Life Discussion: Patient hasa living will and medical power of attorney  Past Medical History:  Diagnosis Date  . Actinic keratosis    Dr. Allyson Sabal  . BPH (benign prostatic hyperplasia)    controlled on Hytrin  . Colon polyps    Dr. Watt Climes  . Diabetes mellitus 2004  . ED (erectile dysfunction)   . Hearing loss    high frequency  .  Hiatal hernia    small, noted on EGD 10/2010  . Melanoma ([place])    right arm  . Microalbuminuria    on ACEI for microalbuminuria (does NOT have HTN)  . Pure hypercholesterolemia 2006  . Traumatic partial tear of biceps tendon 01/2009    "popeye" injury on right  . Wears glasses   . Wears partial dentures    lower    Past Surgical History:  Procedure Laterality Date  . APPENDECTOMY  1955  . CATARACT EXTRACTION Bilateral R 05/07/15, L 05/21/15   Dr. Katy Fitch  . COLONOSCOPY  11/09, 10/29/10   Dr. Watt Climes  . ESOPHAGOGASTRODUODENOSCOPY  10/29/10   Dr. Watt Climes  . INGUINAL HERNIA REPAIR Right 1989  . MELANOMA EXCISION  10/08/10   R upper arm, Dr. Hassell Done  . MELANOMA EXCISION  03/16/2012   Procedure: MELANOMA EXCISION;  Surgeon: Pedro Earls, MD;  Location: [facility];  Service: General;  Laterality: Right;  excision of melanoma of right arm  . TOTAL KNEE ARTHROPLASTY Right 2012  . TOTAL KNEE ARTHROPLASTY Left 2005    Social History   Socioeconomic History  . Marital status: Married    Spouse name: Not on file  . Number of children: 2  . Years of education: Not on file  . Highest education level: Not on file  Occupational History  . Occupation: Retired (from post office)  Social Needs  . Financial resource strain: Not on file  . Food insecurity:    Worry: Not on file    Inability: Not on file  . Transportation needs:    Medical: Not on file    Non-medical: Not on file  Tobacco Use  . Smoking status: Never Smoker  . Smokeless tobacco: Never Used  Substance and Sexual Activity  . Alcohol use: Yes    Alcohol/week: 2.0 - 3.0 standard drinks    Types: 2 - 3 Glasses of wine per week    Comment: 1 glass of wine 1-2 times per month.  . Drug use: No  . Sexual activity: Not Currently    Partners: Female  Lifestyle  . Physical activity:    Days per week: Not on file    Minutes per session: Not on file  . Stress: Not on file  Relationships  . Social connections:    Talks on phone: Not on file    Gets together: Not on file    Attends religious service: Not on file    Active member of club or organization: Not on file    Attends meetings of clubs or organizations: Not on file    Relationship status:  Not on file  . Intimate partner violence:    Fear of current or ex partner: Not on file    Emotionally abused: Not on file    Physically abused: Not on file    Forced sexual activity: Not on file  Other Topics Concern  . Not on file  Social History Narrative   1 son in [place], 1 son in [place].  Still does some handyman work (very little now).  Moved to [place] 09/2012    Family History  Problem Relation Age of Onset  . Diabetes Sister   . Diabetes Brother   . Heart disease Brother   . Hyperlipidemia Brother   . Hypertension Brother   . Stroke Brother   . Diabetes Sister   . Cancer Paternal Uncle   . Heart disease Paternal Uncle     Outpatient  Encounter Medications as of 12/09/2017  Medication Sig Note  . aspirin 81 MG tablet Take 81 mg by mouth daily.     . Calcium Carbonate-Vitamin D (CALCIUM 600+D HIGH POTENCY) 600-400 MG-UNIT per tablet Take 1 tablet by mouth 2 (two) times daily.    . Coenzyme Q10 (COQ10) 100 MG CAPS Take 1 capsule by mouth daily.     . finasteride (PROSCAR) 5 MG tablet    . glucosamine-chondroitin 500-400 MG tablet Take 2 tablets by mouth daily.    Marland Kitchen glucose blood test strip 1 each by Other route 2 (two) times daily. Use as instructed 04/28/2016: ONE TOUCH VERIO  . lisinopril (PRINIVIL,ZESTRIL) 5 MG tablet TAKE 1 TABLET BY MOUTH ONCE DAILY   . loratadine (CLARITIN) 10 MG tablet Take 10 mg by mouth daily.   . metFORMIN (GLUCOPHAGE-XR) 500 MG 24 hr tablet TAKE TWO TABLETS BY MOUTH ONCE DAILY WITH BREAKFAST   . Multiple Vitamins-Minerals (MULTIVITAMIN WITH MINERALS) tablet Take 1 tablet by mouth daily.     . Omega-3 Fatty Acids (FISH OIL PO) Take 1,600 mg by mouth daily.   Glory Rosebush DELICA LANCETS 32R MISC by Does not apply route.   . simvastatin (ZOCOR) 20 MG tablet TAKE 1 TABLET BY MOUTH ONCE DAILY WITH BREAKFAST   . terazosin (HYTRIN) 5 MG capsule TAKE 1 CAPSULE BY MOUTH AT BEDTIME    No facility-administered encounter medications on  file as of 12/09/2017.     Allergies  Allergen Reactions  . Penicillins Rash    Has patient had a PCN reaction causing immediate rash, facial/tongue/throat swelling, SOB or lightheadedness with hypotension: No Has patient had a PCN reaction causing severe rash involving mucus membranes or skin necrosis: No Has patient had a PCN reaction that required hospitalization: No Has patient had a PCN reaction occurring within the last 10 years: No If all of the above answers are "NO", then may proceed with Cephalosporin use.     ROS: The patient denies anorexia, fever, weight changes, headaches, vision loss, ear pain, hoarseness, chest pain, palpitations, dizziness, syncope, dyspnea on exertion, cough, swelling, nausea, vomiting, diarrhea, constipation, abdominal pain, melena, hematochezia, indigestion/heartburn, hematuria, incontinence, nocturia (just once a night), weakened urine stream, dysuria, genital lesions, numbness, tingling, weakness, tremor, rashes (sees derm, had many things frozen today), depression, anxiety, abnormal bleeding/bruising (easily bruises if he bumps his arms), or enlarged lymph nodes.  +ED.  Rare heartburn, about once a month (spicy, spaghetti), uses Tums prn, rarely. 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 Mild arthritis in his thumbs. No other joint pains.   PHYSICAL EXAM:  BP 118/60   Pulse 60   Ht 5\' 11"  (1.803 m)   Wt 199 lb 6.4 oz (90.4 kg)   BMI 27.81 kg/m   Wt Readings from Last 3 Encounters:  12/09/17 199 lb 6.4 oz (90.4 kg)  06/08/17 197 lb (89.4 kg)  04/13/17 194 lb 3.2 oz (88.1 kg)    General Appearance:  Alert, cooperative, no distress, appears stated age   Head:  Normocephalic, without obvious abnormality, atraumatic   Eyes:  PERRL, conjunctiva/corneas clear, EOM's intact, fundi normal  Ears:  TM's and EAC's normal.   Nose:  Nares normal, mucosa normal, no drainage or sinus tenderness   Throat:   Lips, mucosa, and tongue normal; teeth and gums normal   Neck:  Supple, no lymphadenopathy; thyroid: no enlargement/ tenderness/nodules; no carotid bruit or JVD   Back:  Spine nontender, no curvature, ROM normal, no CVA  tenderness. Scoliosis and prominence/kyphosis of the lower lumbar spine. nontender.  Lungs:  Clear to auscultation bilaterally without wheezes, rales or ronchi; respirations unlabored   Chest Wall:  No tenderness or deformity   Heart:  Regular rate and rhythm,S1 and S2 normal, no murmur, rub or gallop.  Breast Exam:  No chest wall tenderness, masses or gynecomastia   Abdomen:  Soft, non-tender, nondistended, normoactive bowel sounds, no masses, no hepatosplenomegaly. Small, soft, easily reducible umbilical hernia  Genitalia:  Normal male external genitalia without lesions. Testicles without masses. No inguinal hernias.   Rectal:  Normal sphincter tone, no masses or tenderness; guaiac negative stool. Prostate smooth, no nodules, mild-mod enlarged (right is a little larger than left, unchanged from prior exam). No nodules   Extremities:  No clubbing, cyanosis or edema   Pulses:  2+ and symmetric all extremities   Skin:  Skin color, texture, turgor normal, no rashes. Senile purpura on his arms. Many AK's on forearms and legs bilaterally, some of which are very red, as they were treated with cryotherapy earlier today.  Bandages to left medial lower leg (saw dermatologist earlier today). Onychomycotic 1st and 5th toenails bilaterally.  Normal monofilament sensation  Lymph nodes:  Cervical, supraclavicular, and axillary nodes normal   Neurologic:  CNII-XII intact, normal strength, sensation and gait; reflexes 2+ and symmetric throughout   Psych:  Normal mood, affect, hygiene and grooming  Diabetic foot exam performed.   Lab Results  Component Value Date   HGBA1C 6.5 (H) 12/07/2017     Chemistry      Component Value Date/Time   NA  143 12/07/2017 0836   K 4.5 12/07/2017 0836   CL 105 12/07/2017 0836   CO2 23 12/07/2017 0836   BUN 27 12/07/2017 0836   CREATININE 1.04 12/07/2017 0836   CREATININE 1.07 01/29/2017 1242      Component Value Date/Time   CALCIUM 9.5 12/07/2017 0836   ALKPHOS 107 12/07/2017 0836   AST 21 12/07/2017 0836   ALT 24 12/07/2017 0836   BILITOT 0.3 12/07/2017 0836     Fasting glucose 132  Urine microalb/Cr ratio 26.8  Lab Results  Component Value Date   TSH 6.360 (H) 12/07/2017    Lab Results  Component Value Date   CHOL 120 12/07/2017   HDL 39 (L) 12/07/2017   LDLCALC 62 12/07/2017   TRIG 94 12/07/2017   CHOLHDL 3.1 12/07/2017    ASSESSMENT/PLAN:  Medicare annual wellness visit, subsequent  Type 2 diabetes mellitus with hypercholesterolemia ([place]) - controlled; continue healthy diet, regular exercise. Some abdominal fat/weight loss rec - Plan: TSH, Hemoglobin A1c, Glucose, random  Benign prostatic hyperplasia, unspecified whether lower urinary tract symptoms present - symptoms improved, stable on 2 meds.  Pure hypercholesterolemia - lipid at goal  Subclinical hypothyroidism - asymptomatic; recheck TSH at next visit, sooner if sx develop (reviewed) - Plan: TSH  Senile purpura ([place])  Diabetes mellitus without complication ([place]) - controlled - Plan: metFORMIN (GLUCOPHAGE-XR) 500 MG 24 hr tablet  Medication monitoring encounter - Plan: CBC with Differential/Platelet, Hemoglobin A1c, Hepatic function panel, Glucose, random    Get shingrix #2 date from [facility]  Recommended at least 30 minutes of aerobic activity at least 5 days/week; weight bearing exercise at least 2x/week; proper sunscreen use reviewed; healthy diet and alcohol recommendations (less than or equal to 2 drinks/day) reviewed; regular seatbelt use; changing batteries in smoke detectors. Immunization recommendations discussed--continue yearly high dose flu shots.  Colonoscopy recommendations reviewed,  UTD.   AWV:  MOST form reviewed and updated. Full Code, Full Care.   F/u 6 mos with fasting labs prior A1c, glu, LFT's, CBC, TSH    Medicare Attestation I have personally reviewed: The patient's medical and social history Their use of alcohol, tobacco or illicit drugs Their current medications and supplements The patient's functional ability including ADLs,fall risks, home safety risks, cognitive, and hearing and visual impairment Diet and physical activities Evidence for depression or mood disorders  The patient's weight, height and BMI have been recorded in the chart.  I have made referrals, counseling, and provided education to the patient based on review of the above and I have provided the patient with a written personalized care plan for preventive services.

## 2017-12-08 NOTE — Patient Instructions (Addendum)
HEALTH MAINTENANCE RECOMMENDATIONS:  It is recommended that you get at least 30 minutes of aerobic exercise at least 5 days/week (for weight loss, you may need as much as 60-90 minutes). This can be any activity that gets your heart rate up. This can be divided in 10-15 minute intervals if needed, but try and build up your endurance at least once a week.  Weight bearing exercise is also recommended twice weekly.  Eat a healthy diet with lots of vegetables, fruits and fiber.  "Colorful" foods have a lot of vitamins (ie green vegetables, tomatoes, red peppers, etc).  Limit sweet tea, regular sodas and alcoholic beverages, all of which has a lot of calories and sugar.  Up to 2 alcoholic drinks daily may be beneficial for men (unless trying to lose weight, watch sugars).  Drink a lot of water.  Sunscreen of at least SPF 30 should be used on all sun-exposed parts of the skin when outside between the hours of 10 am and 4 pm (not just when at beach or pool, but even with exercise, golf, tennis, and yard work!)  Use a sunscreen that says "broad spectrum" so it covers both UVA and UVB rays, and make sure to reapply every 1-2 hours.  Remember to change the batteries in your smoke detectors when changing your clock times in the spring and fall.  Use your seat belt every time you are in a car, and please drive safely and not be distracted with cell phones and texting while driving.   Mr. Andre Walker , Thank you for taking time to come for your Medicare Wellness Visit. I appreciate your ongoing commitment to your health goals. Please review the following plan we discussed and let me know if I can assist you in the future.   This is a list of the screening recommended for you and due dates:  Health Maintenance  Topic Date Due  . Complete foot exam   12/03/2017  . Hemoglobin A1C  06/08/2018  . Eye exam for diabetics  09/16/2018  . Tetanus Vaccine  12/01/2020  . Flu Shot  Completed  . Pneumonia vaccines  Completed    Foot exam was done today.  Your cholesterol panel is good: Lab Results  Component Value Date   CHOL 120 12/07/2017   HDL 39 (L) 12/07/2017   LDLCALC 62 12/07/2017   TRIG 94 12/07/2017   CHOLHDL 3.1 12/07/2017   Fasting sugar was 132  Lab Results  Component Value Date   HGBA1C 6.5 (H) 12/07/2017     Chemistry      Component Value Date/Time   NA 143 12/07/2017 0836   K 4.5 12/07/2017 0836   CL 105 12/07/2017 0836   CO2 23 12/07/2017 0836   BUN 27 12/07/2017 0836   CREATININE 1.04 12/07/2017 0836   CREATININE 1.07 01/29/2017 1242      Component Value Date/Time   CALCIUM 9.5 12/07/2017 0836   ALKPHOS 107 12/07/2017 0836   AST 21 12/07/2017 0836   ALT 24 12/07/2017 0836   BILITOT 0.3 12/07/2017 0836     Thyroid test was back up to being slightly high (mildly underactive): Lab Results  Component Value Date   TSH 6.360 (H) 12/07/2017   If you develop any fatigue, weight gain, constipation, feeling cold all the time (and not due to the cold weather!), hair loss, skin or nail changes that could be related to underactive thyroid, we can always recheck the thyroid (and start treatment) prior to your next  visit in April, just let us know.

## 2017-12-09 ENCOUNTER — Encounter: Payer: Self-pay | Admitting: Family Medicine

## 2017-12-09 ENCOUNTER — Ambulatory Visit (INDEPENDENT_AMBULATORY_CARE_PROVIDER_SITE_OTHER): Payer: Medicare Other | Admitting: Family Medicine

## 2017-12-09 ENCOUNTER — Other Ambulatory Visit: Payer: Self-pay | Admitting: *Deleted

## 2017-12-09 VITALS — BP 118/60 | HR 60 | Ht 71.0 in | Wt 199.4 lb

## 2017-12-09 DIAGNOSIS — E119 Type 2 diabetes mellitus without complications: Secondary | ICD-10-CM

## 2017-12-09 DIAGNOSIS — D692 Other nonthrombocytopenic purpura: Secondary | ICD-10-CM

## 2017-12-09 DIAGNOSIS — Z5181 Encounter for therapeutic drug level monitoring: Secondary | ICD-10-CM

## 2017-12-09 DIAGNOSIS — E039 Hypothyroidism, unspecified: Secondary | ICD-10-CM

## 2017-12-09 DIAGNOSIS — E7800 Pure hypercholesterolemia, unspecified: Secondary | ICD-10-CM | POA: Diagnosis not present

## 2017-12-09 DIAGNOSIS — Z0000 Encounter for general adult medical examination without abnormal findings: Secondary | ICD-10-CM

## 2017-12-09 DIAGNOSIS — E1169 Type 2 diabetes mellitus with other specified complication: Secondary | ICD-10-CM

## 2017-12-09 DIAGNOSIS — E038 Other specified hypothyroidism: Secondary | ICD-10-CM

## 2017-12-09 DIAGNOSIS — N400 Enlarged prostate without lower urinary tract symptoms: Secondary | ICD-10-CM

## 2017-12-09 MED ORDER — ONETOUCH DELICA LANCETS 33G MISC: 1.0000 | Freq: Every day | 2 refills | Status: DC

## 2017-12-09 MED ORDER — METFORMIN HCL ER 500 MG PO TB24: ORAL_TABLET | ORAL | 1 refills | Status: DC

## 2017-12-09 MED ORDER — GLUCOSE BLOOD VI STRP: 1.0000 | ORAL_STRIP | Freq: Every day | 2 refills | Status: DC

## 2018-01-11 ENCOUNTER — Other Ambulatory Visit: Payer: Self-pay | Admitting: Family Medicine

## 2018-01-11 DIAGNOSIS — N400 Enlarged prostate without lower urinary tract symptoms: Secondary | ICD-10-CM

## 2018-02-13 ENCOUNTER — Other Ambulatory Visit: Payer: Self-pay | Admitting: Family Medicine

## 2018-02-13 DIAGNOSIS — E119 Type 2 diabetes mellitus without complications: Secondary | ICD-10-CM

## 2018-02-13 DIAGNOSIS — E7800 Pure hypercholesterolemia, unspecified: Secondary | ICD-10-CM

## 2018-03-01 ENCOUNTER — Encounter: Payer: Self-pay | Admitting: Family Medicine

## 2018-03-01 ENCOUNTER — Ambulatory Visit (INDEPENDENT_AMBULATORY_CARE_PROVIDER_SITE_OTHER): Payer: Medicare Other | Admitting: Family Medicine

## 2018-03-01 VITALS — BP 110/60 | HR 64 | Temp 99.4°F | Ht 71.0 in | Wt 198.2 lb

## 2018-03-01 DIAGNOSIS — R1013 Epigastric pain: Secondary | ICD-10-CM

## 2018-03-01 DIAGNOSIS — R112 Nausea with vomiting, unspecified: Secondary | ICD-10-CM

## 2018-03-01 LAB — COMPREHENSIVE METABOLIC PANEL
ALK PHOS: 118 IU/L — AB (ref 39–117)
ALT: 23 IU/L (ref 0–44)
AST: 18 IU/L (ref 0–40)
Albumin/Globulin Ratio: 1.8 (ref 1.2–2.2)
Albumin: 4.2 g/dL (ref 3.5–4.7)
BILIRUBIN TOTAL: 0.5 mg/dL (ref 0.0–1.2)
BUN / CREAT RATIO: 22 (ref 10–24)
BUN: 20 mg/dL (ref 8–27)
CO2: 24 mmol/L (ref 20–29)
CREATININE: 0.92 mg/dL (ref 0.76–1.27)
Calcium: 9 mg/dL (ref 8.6–10.2)
Chloride: 104 mmol/L (ref 96–106)
GFR calc Af Amer: 89 mL/min/{1.73_m2} (ref >59)
GFR, EST NON AFRICAN AMERICAN: 77 mL/min/{1.73_m2} (ref >59)
GLUCOSE: 180 mg/dL — AB (ref 65–99)
Globulin, Total: 2.3 g/dL (ref 1.5–4.5)
POTASSIUM: 4.3 mmol/L (ref 3.5–5.2)
SODIUM: 139 mmol/L (ref 134–144)
TOTAL PROTEIN: 6.5 g/dL (ref 6.0–8.5)

## 2018-03-01 LAB — CBC WITH DIFFERENTIAL/PLATELET
BASOS: 0 %
Basophils Absolute: 0 10*3/uL (ref 0.0–0.2)
EOS (ABSOLUTE): 0 10*3/uL (ref 0.0–0.4)
Eos: 0 %
Hematocrit: 37.2 % — ABNORMAL LOW (ref 37.5–51.0)
Hemoglobin: 12.7 g/dL — ABNORMAL LOW (ref 13.0–17.7)
LYMPHS ABS: 0.9 10*3/uL (ref 0.7–3.1)
Lymphs: 7 %
MCH: 31.7 pg (ref 26.6–33.0)
MCHC: 34.1 g/dL (ref 31.5–35.7)
MCV: 93 fL (ref 79–97)
MONOS ABS: 0.8 10*3/uL (ref 0.1–0.9)
Monocytes: 6 %
Neutrophils Absolute: 11.5 10*3/uL — ABNORMAL HIGH (ref 1.4–7.0)
Neutrophils: 87 %
Platelets: 267 10*3/uL (ref 150–450)
RBC: 4.01 x10E6/uL — ABNORMAL LOW (ref 4.14–5.80)
RDW: 13.2 % (ref 11.6–15.4)
WBC: 13.2 10*3/uL — ABNORMAL HIGH (ref 3.4–10.8)

## 2018-03-01 LAB — LIPASE: Lipase: 16 U/L (ref 13–78)

## 2018-03-01 MED ORDER — ONDANSETRON 4 MG PO TBDP: 4.0000 mg | ORAL_TABLET | Freq: Three times a day (TID) | ORAL | 0 refills | Status: DC | PRN

## 2018-03-01 NOTE — Progress Notes (Signed)
Chief Complaint  Patient presents with  . Emesis    stomach started hurting last night and then he began vomiting around 11:00pm and vomited 3-4 times since last time being 8:00 this am. Stomach is really sore. No diarrhea or other symptoms. Had a corn dog for dinner, same as wife.    Patient presents with complaint of abdominal pain and vomiting.  Stomach pain started 10:30-11pm last night, and has vomited 3-4 times since then.  Most emesis looked like food, some appeared dark/black.  He has taken some pepto bismol.  He does feel somewhat better after vomiting.   Currently has a lot of pain across his upper stomach. Denies nausea. Had some ginger ale and had apple sauce 9am. Last BM was yesterday afternoon, normal. Denies right sided pain or any radiation to the back.  He has been having some indigestion recently, needs to take Tums once or twice a week.   Not taking any NSAIDs  PMH, PSH, SH reviewed  Outpatient Encounter Medications as of 03/01/2018  Medication Sig Note  . acetaminophen (TYLENOL) 325 MG tablet Take 650 mg by mouth every 6 (six) hours as needed. 03/01/2018: Last dose 7am  . aspirin 81 MG tablet Take 81 mg by mouth daily.     Marland Kitchen bismuth subsalicylate (PEPTO BISMOL) 262 MG/15ML suspension Take 30 mLs by mouth every 6 (six) hours as needed. 03/01/2018: Took at 1 am  . Calcium Carbonate-Vitamin D (CALCIUM 600+D HIGH POTENCY) 600-400 MG-UNIT per tablet Take 1 tablet by mouth 2 (two) times daily.    . Coenzyme Q10 (COQ10) 100 MG CAPS Take 1 capsule by mouth daily.     . finasteride (PROSCAR) 5 MG tablet    . glucosamine-chondroitin 500-400 MG tablet Take 2 tablets by mouth daily.    Marland Kitchen glucose blood test strip 1 each by Other route daily. Use as instructed   . lisinopril (PRINIVIL,ZESTRIL) 5 MG tablet TAKE 1 TABLET BY MOUTH ONCE DAILY   . loratadine (CLARITIN) 10 MG tablet Take 10 mg by mouth daily.   . metFORMIN (GLUCOPHAGE-XR) 500 MG 24 hr tablet TAKE TWO TABLETS BY MOUTH ONCE DAILY  WITH BREAKFAST   . Multiple Vitamins-Minerals (MULTIVITAMIN WITH MINERALS) tablet Take 1 tablet by mouth daily.     . Omega-3 Fatty Acids (FISH OIL PO) Take 1,600 mg by mouth daily.   Glory Rosebush DELICA LANCETS 86V MISC 1 each by Does not apply route daily.   . simvastatin (ZOCOR) 20 MG tablet TAKE 1 TABLET BY MOUTH ONCE DAILY WITH BREAKFAST   . terazosin (HYTRIN) 5 MG capsule TAKE 1 CAPSULE BY MOUTH AT BEDTIME   . ondansetron (ZOFRAN ODT) 4 MG disintegrating tablet Take 1 tablet (4 mg total) by mouth every 8 (eight) hours as needed for nausea or vomiting.    No facility-administered encounter medications on file as of 03/01/2018.    (zofran rx'd today, not taking prior to today's visit)  Allergies  Allergen Reactions  . Penicillins Rash    Has patient had a PCN reaction causing immediate rash, facial/tongue/throat swelling, SOB or lightheadedness with hypotension: No Has patient had a PCN reaction causing severe rash involving mucus membranes or skin necrosis: No Has patient had a PCN reaction that required hospitalization: No Has patient had a PCN reaction occurring within the last 10 years: No If all of the above answers are "NO", then may proceed with Cephalosporin use.    ROS:  No fever, chills, headaches, dizziness. No URI symptoms, cough, shortness  of breath or chest pain. No bleeding, bruising or rash. Couldn't sleep due to pain last night. See HPI   PHYSICAL EXAM:  BP 110/60   Pulse 64   Temp 99.4 F (37.4 C) (Tympanic)   Ht 5\' 11"  (1.803 m)   Wt 198 lb 3.2 oz (89.9 kg)   BMI 27.64 kg/m   Elderly male, appearing more fatigued than his usual self, but overall in good spirits,  He was in mild distress during abdominal exam only HEENT: conjunctiva and sclera are clear,EOMI.  OP--normal, moist mucus membranes Neck: no lymphadenopathy or mass Heart: regular rate and rhythm Lungs: clear bilaterally Abdomen: active BS.  Tender across entire upper abdomen.  He has a small  umbilical hernia, nontender and reducible.  Lower abdomen is soft, nontender.  No rebound tenderness Extremities: no edema Skin: normal turgor, no rash Psych: normal mood, affect, hygiene and grooming  ASSESSMENT/PLAN:  Epigastric pain - suspect acute/viral gastritis. Treat with PPI, zofran prn, bland diet, hydration. red flag symptomsfor ER visit reviewed - Plan: CBC with Differential/Platelet, Comprehensive metabolic panel, Lipase  Nausea and vomiting, intractability of vomiting not specified, unspecified vomiting type - Plan: CBC with Differential/Platelet, Comprehensive metabolic panel, Lipase, ondansetron (ZOFRAN ODT) 4 MG disintegrating tablet    Take prilosec OTC (omeprazole 20mg ) twice daily for the next few days until better. Continue tylenol as needed for pain. Take the ondansetron only if needed for nausea. We are checking stat labs and will be in touch later today. If you vomit blood, have signs of dehydration as we discussed, then you may need to go to the ER for faster evaluation.   Addendum: Lab Results  Component Value Date   WBC 13.2 (H) 03/01/2018   HGB 12.7 (L) 03/01/2018   HCT 37.2 (L) 03/01/2018   MCV 93 03/01/2018   PLT 267 03/01/2018   Some left shift Normal chem panel x glu 180 (nonfasting) Normal lipase. Pt had slept a lot of the day, at some chicken noodle soup, and was feeling somewhat better when called at 5pm with his results. To contact us if symptoms persist/worsen.

## 2018-03-01 NOTE — Patient Instructions (Signed)
  Take prilosec OTC (omeprazole 20mg ) twice daily for the next few days until better. Continue tylenol as needed for pain. Take the ondansetron only if needed for nausea. We are checking stat labs and will be in touch later today. If you vomit blood, have signs of dehydration as we discussed, then you may need to go to the ER for faster evaluation.

## 2018-05-11 ENCOUNTER — Other Ambulatory Visit: Payer: Self-pay | Admitting: Family Medicine

## 2018-05-11 DIAGNOSIS — E7800 Pure hypercholesterolemia, unspecified: Secondary | ICD-10-CM

## 2018-05-14 ENCOUNTER — Other Ambulatory Visit: Payer: Self-pay | Admitting: Family Medicine

## 2018-05-14 DIAGNOSIS — E119 Type 2 diabetes mellitus without complications: Secondary | ICD-10-CM

## 2018-05-31 ENCOUNTER — Other Ambulatory Visit: Payer: Medicare Other

## 2018-06-02 ENCOUNTER — Other Ambulatory Visit: Payer: Self-pay

## 2018-06-02 ENCOUNTER — Other Ambulatory Visit: Payer: Medicare Other

## 2018-06-02 DIAGNOSIS — E7800 Pure hypercholesterolemia, unspecified: Principal | ICD-10-CM

## 2018-06-02 DIAGNOSIS — E039 Hypothyroidism, unspecified: Secondary | ICD-10-CM

## 2018-06-02 DIAGNOSIS — E1169 Type 2 diabetes mellitus with other specified complication: Secondary | ICD-10-CM

## 2018-06-02 DIAGNOSIS — Z5181 Encounter for therapeutic drug level monitoring: Secondary | ICD-10-CM

## 2018-06-02 DIAGNOSIS — E038 Other specified hypothyroidism: Secondary | ICD-10-CM

## 2018-06-03 LAB — CBC WITH DIFFERENTIAL/PLATELET
Basophils Absolute: 0 10*3/uL (ref 0.0–0.2)
Basos: 0 %
EOS (ABSOLUTE): 0.2 10*3/uL (ref 0.0–0.4)
Eos: 2 %
Hematocrit: 40.1 % (ref 37.5–51.0)
Hemoglobin: 13.5 g/dL (ref 13.0–17.7)
Immature Grans (Abs): 0 10*3/uL (ref 0.0–0.1)
Immature Granulocytes: 0 %
Lymphocytes Absolute: 2.1 10*3/uL (ref 0.7–3.1)
Lymphs: 29 %
MCH: 32.3 pg (ref 26.6–33.0)
MCHC: 33.7 g/dL (ref 31.5–35.7)
MCV: 96 fL (ref 79–97)
Monocytes Absolute: 0.5 10*3/uL (ref 0.1–0.9)
Monocytes: 6 %
Neutrophils Absolute: 4.5 10*3/uL (ref 1.4–7.0)
Neutrophils: 63 %
Platelets: 277 10*3/uL (ref 150–450)
RBC: 4.18 x10E6/uL (ref 4.14–5.80)
RDW: 12.2 % (ref 11.6–15.4)
WBC: 7.3 10*3/uL (ref 3.4–10.8)

## 2018-06-03 LAB — TSH: TSH: 6.76 u[IU]/mL — ABNORMAL HIGH (ref 0.450–4.500)

## 2018-06-03 LAB — HEPATIC FUNCTION PANEL
ALT: 24 IU/L (ref 0–44)
AST: 20 IU/L (ref 0–40)
Albumin: 4.3 g/dL (ref 3.6–4.6)
Alkaline Phosphatase: 105 IU/L (ref 39–117)
Bilirubin Total: 0.7 mg/dL (ref 0.0–1.2)
Bilirubin, Direct: 0.19 mg/dL (ref 0.00–0.40)
Total Protein: 6.6 g/dL (ref 6.0–8.5)

## 2018-06-03 LAB — HEMOGLOBIN A1C
Est. average glucose Bld gHb Est-mCnc: 143 mg/dL
Hgb A1c MFr Bld: 6.6 % — ABNORMAL HIGH (ref 4.8–5.6)

## 2018-06-03 LAB — GLUCOSE, RANDOM: Glucose: 145 mg/dL — ABNORMAL HIGH (ref 65–99)

## 2018-06-04 ENCOUNTER — Other Ambulatory Visit: Payer: Medicare Other

## 2018-06-07 ENCOUNTER — Encounter: Payer: Medicare Other | Admitting: Family Medicine

## 2018-06-10 ENCOUNTER — Other Ambulatory Visit: Payer: Self-pay | Admitting: Family Medicine

## 2018-06-10 DIAGNOSIS — E119 Type 2 diabetes mellitus without complications: Secondary | ICD-10-CM

## 2018-07-21 ENCOUNTER — Other Ambulatory Visit: Payer: Self-pay | Admitting: Family Medicine

## 2018-07-21 DIAGNOSIS — N400 Enlarged prostate without lower urinary tract symptoms: Secondary | ICD-10-CM

## 2018-08-01 NOTE — Progress Notes (Signed)
Start time: 1:30 End time: 1:55 Converted to telephone visit 1:40, after frequent freezing of the video and audio, interfering with ability to understand each other.  Virtual Visit via Video Note  I connected with Andre Walker on 08/02/2018 by a video enabled telemedicine application and verified that I am speaking with the correct person using two identifiers.  Location: Patient: at home, wife is with him. Provider: Office   I discussed the limitations of evaluation and management by telemedicine and the availability of in person appointments. The patient expressed understanding and agreed to proceed. He consents to filing insurance for today's visit.  History of Present Illness:  Chief Complaint  Patient presents with  . Medication Management    Patient presents for 6 month follow-up.  He had labs done prior to appointment (done in April, prior to original appointment, which was rescheduled). See below for results.  Diabetes follow-up: Blood sugars at home are running106-135 in the mornings, averaging about 120-125; doesn't check other times of the day. Denies hypoglycemia. Denies polydipsia and polyuria. He reports compliance with metformin and denies side effects.  Last eye exam was in July, 2019, no retinopathy. Patient follows a low sugar diet and checks feet regularly without concerns. Denies numbness, tingling, burning or sores. Has thickened fungal toenails, denies any pain/discomfort. He has h/o microalbuminuria, and continues on lisinopril without side effects. Denies dizziness or cough.  Hyperlipidemia follow-up: Patient is reportedly following a low-fat, low cholesterol diet. Compliant with medications (simvastatin) and denies medication side effects. Last lipids were at goal: Lab Results  Component Value Date   CHOL 120 12/07/2017   HDL 39 (L) 12/07/2017   LDLCALC 62 12/07/2017   TRIG 94 12/07/2017   CHOLHDL 3.1 12/07/2017    BPH:  Under the care of [facility] (since getting admitted 01/2017, had UTI, AKI). He had normal PVR in 02/2017. Due to some obstructive symptoms, finasteride was added to his hytrin. He denies any side effects. He continues on this regimen, getting up once or twice a night. He last saw urologist in July (no notes received).. Normal stream, empties bladder well. Up 1-2x/night to void.  He has had some borderline and mildly elevated TSH values in the past,never symptomatic. He denies fatigue, weight gain, cold intolerance, hair/skin changes.    PMH, PSH, SH reviewed  Outpatient Encounter Medications as of 08/02/2018  Medication Sig  . aspirin 81 MG tablet Take 81 mg by mouth daily.    . Calcium Carbonate-Vitamin D (CALCIUM 600+D HIGH POTENCY) 600-400 MG-UNIT per tablet Take 1 tablet by mouth 2 (two) times daily.   . Coenzyme Q10 (COQ10) 100 MG CAPS Take 1 capsule by mouth daily.    . finasteride (PROSCAR) 5 MG tablet   . glucosamine-chondroitin 500-400 MG tablet Take 2 tablets by mouth daily.   Marland Kitchen glucose blood test strip 1 each by Other route daily. Use as instructed  . lisinopril (PRINIVIL,ZESTRIL) 5 MG tablet Take 1 tablet by mouth once daily  . loratadine (CLARITIN) 10 MG tablet Take 10 mg by mouth daily.  . metFORMIN (GLUCOPHAGE-XR) 500 MG 24 hr tablet TAKE 2 TABLETS BY MOUTH ONCE DAILY WITH BREAKFAST  . Multiple Vitamins-Minerals (MULTIVITAMIN WITH MINERALS) tablet Take 1 tablet by mouth daily.    . Omega-3 Fatty Acids (FISH OIL PO) Take 1,600 mg by mouth daily.  . ondansetron (ZOFRAN ODT) 4 MG disintegrating tablet Take 1 tablet (4 mg total) by mouth every 8 (eight) hours as needed for nausea or  vomiting.  Glory Rosebush DELICA LANCETS 26S MISC 1 each by Does not apply route daily.  . simvastatin (ZOCOR) 20 MG tablet Take 1 tablet by mouth once daily with breakfast  . terazosin (HYTRIN) 5 MG capsule Take 1 capsule by mouth at bedtime  . acetaminophen (TYLENOL) 325 MG tablet Take 650 mg by mouth every 6 (six) hours as  needed.  . bismuth subsalicylate (PEPTO BISMOL) 262 MG/15ML suspension Take 30 mLs by mouth every 6 (six) hours as needed.   No facility-administered encounter medications on file as of 08/02/2018.    Allergies  Allergen Reactions  . Penicillins Rash    Has patient had a PCN reaction causing immediate rash, facial/tongue/throat swelling, SOB or lightheadedness with hypotension: No Has patient had a PCN reaction causing severe rash involving mucus membranes or skin necrosis: No Has patient had a PCN reaction that required hospitalization: No Has patient had a PCN reaction occurring within the last 10 years: No If all of the above answers are "NO", then may proceed with Cephalosporin use.     ROS:  No fever, chills, headaches, dizziness, URI symptoms, chest pain, shortness of breath.  No change in hair/skin/nails/bowels/moods. Moods are good. Weight loss noted since getting meals delivered, eating less, and walking more. Clothes are just slightly looser.  He reports "feeling great!"   Observations/Objective:  BP 113/72   Pulse 91   Temp 97.9 F (36.6 C)   Ht 5' 11" (1.803 m)   Wt 187 lb (84.8 kg)   BMI 26.08 kg/m    (weight was on home scale, when completely "stripped down")  Wt Readings from Last 3 Encounters:  03/01/18 198 lb 3.2 oz (89.9 kg)  12/09/17 199 lb 6.4 oz (90.4 kg)  06/08/17 197 lb (89.4 kg)   Exam is limited due to the virtual nature of the visit. He is alert, oriented, cranial nerves grossly intact.  He is in good spirits, normal mood, affect, grooming.   Lab Results  Component Value Date   HGBA1C 6.6 (H) 06/02/2018   Fasting glucose 145  Lab Results  Component Value Date   TSH 6.760 (H) 06/02/2018   Lab Results  Component Value Date   WBC 7.3 06/02/2018   HGB 13.5 06/02/2018   HCT 40.1 06/02/2018   MCV 96 06/02/2018   PLT 277 06/02/2018    Assessment and Plan:  Type 2 diabetes mellitus with hypercholesterolemia ([place]) - well controlled;  reviewed proper diet, exercise. Cont metformin - Plan: Hemoglobin A1c, Microalbumin / creatinine urine ratio, Comprehensive metabolic panel  Subclinical hypothyroidism - continues to be asymptomatic; continue to monitor, no treatment at this time - Plan: TSH  Pure hypercholesterolemia - at goal on current regimen per last check. recheck prior to next visit - Plan: simvastatin (ZOCOR) 20 MG tablet, Lipid panel  Benign prostatic hyperplasia, unspecified whether lower urinary tract symptoms present - controlled with current regimen  Diabetes mellitus with microalbuminuria ([place]) - controlled; continue current regimen - Plan: lisinopril (ZESTRIL) 5 MG tablet  Medication monitoring encounter - Plan: Hemoglobin A1c, Microalbumin / creatinine urine ratio, Comprehensive metabolic panel, Lipid panel   F/u as scheduled for [facility] in November. He would like labs prior. c-met, lipids, TSH, urine microalb, A1c  Diabetic yearly eye exam is due again in July. F/u with urologist in July (and ask to send [place] the visit notes).   Follow Up Instructions:  AVS will be mailed to patient.  I discussed the assessment and treatment plan with the patient.  The patient was provided an opportunity to ask questions and all were answered. The patient agreed with the plan and demonstrated an understanding of the instructions.   The patient was advised to call back or seek an in-person evaluation if the symptoms worsen or if the condition fails to improve as anticipated.  I provided 25 minutes of non-face-to-face time during this encounter.   Vikki Ports, MD

## 2018-08-02 ENCOUNTER — Ambulatory Visit (INDEPENDENT_AMBULATORY_CARE_PROVIDER_SITE_OTHER): Payer: Medicare Other | Admitting: Family Medicine

## 2018-08-02 ENCOUNTER — Other Ambulatory Visit: Payer: Self-pay

## 2018-08-02 ENCOUNTER — Encounter: Payer: Self-pay | Admitting: Family Medicine

## 2018-08-02 VITALS — BP 113/72 | HR 91 | Temp 97.9°F | Ht 71.0 in | Wt 187.0 lb

## 2018-08-02 DIAGNOSIS — E7800 Pure hypercholesterolemia, unspecified: Secondary | ICD-10-CM

## 2018-08-02 DIAGNOSIS — N400 Enlarged prostate without lower urinary tract symptoms: Secondary | ICD-10-CM | POA: Diagnosis not present

## 2018-08-02 DIAGNOSIS — E039 Hypothyroidism, unspecified: Secondary | ICD-10-CM | POA: Diagnosis not present

## 2018-08-02 DIAGNOSIS — Z5181 Encounter for therapeutic drug level monitoring: Secondary | ICD-10-CM

## 2018-08-02 DIAGNOSIS — E038 Other specified hypothyroidism: Secondary | ICD-10-CM | POA: Insufficient documentation

## 2018-08-02 DIAGNOSIS — E1169 Type 2 diabetes mellitus with other specified complication: Secondary | ICD-10-CM | POA: Diagnosis not present

## 2018-08-02 DIAGNOSIS — R809 Proteinuria, unspecified: Secondary | ICD-10-CM

## 2018-08-02 DIAGNOSIS — E1129 Type 2 diabetes mellitus with other diabetic kidney complication: Secondary | ICD-10-CM

## 2018-08-02 MED ORDER — SIMVASTATIN 20 MG PO TABS: 20.0000 mg | ORAL_TABLET | Freq: Every day | ORAL | 1 refills | Status: DC

## 2018-08-02 MED ORDER — LISINOPRIL 5 MG PO TABS: 5.0000 mg | ORAL_TABLET | Freq: Every day | ORAL | 1 refills | Status: DC

## 2018-08-02 NOTE — Patient Instructions (Signed)
Your diabetes remains controlled. Continue to limit your sugar/sweets and carbs in your diet, and continue to get daily exercise.  Continue your metformin at the current dose.  Your next diabetic eye exam is due in July, be sure to schedule this.  I believe you are due to follow-up with your urologist in July. We didn't get the notes from your visit last year.  Please remind them to send the office notes from your next visit--thanks!  We sent in refills for your lisinopril and simvastatin.  Call your pharmacy when other refills are needed (I think your metformin will be due in another 4-5 weeks).  Your thyroid test continues to show that it is mildly underactive.  Since you do not have any symptoms related to the thyroid, we are going to continue to just monitor, and not start treatment.  Below is some information on hypothyroidism, so that you can read up on it.  If you develop any symptoms, please be sure to let us know.  We will be rechecking your thyroid prior to your next appointment.   Hypothyroidism  Hypothyroidism is when the thyroid gland does not make enough of certain hormones (it is underactive). The thyroid gland is a small gland located in the lower front part of the neck, just in front of the windpipe (trachea). This gland makes hormones that help control how the body uses food for energy (metabolism) as well as how the heart and brain function. These hormones also play a role in keeping your bones strong. When the thyroid is underactive, it produces too little of the hormones thyroxine (T4) and triiodothyronine (T3). What are the causes? This condition may be caused by:  Hashimoto's disease. This is a disease in which the body's disease-fighting system (immune system) attacks the thyroid gland. This is the most common cause.  Viral infections.  Pregnancy.  Certain medicines.  Birth defects.  Past radiation treatments to the head or neck for cancer.  Past treatment with  radioactive iodine.  Past exposure to radiation in the environment.  Past surgical removal of part or all of the thyroid.  Problems with a gland in the center of the brain (pituitary gland).  Lack of enough iodine in the diet. What increases the risk? You are more likely to develop this condition if:  You are male.  You have a family history of thyroid conditions.  You use a medicine called lithium.  You take medicines that affect the immune system (immunosuppressants). What are the signs or symptoms? Symptoms of this condition include:  Feeling as though you have no energy (lethargy).  Not being able to tolerate cold.  Weight gain that is not explained by a change in diet or exercise habits.  Lack of appetite.  Dry skin.  Coarse hair.  Menstrual irregularity.  Slowing of thought processes.  Constipation.  Sadness or depression. How is this diagnosed? This condition may be diagnosed based on:  Your symptoms, your medical history, and a physical exam.  Blood tests. You may also have imaging tests, such as an ultrasound or MRI. How is this treated? This condition is treated with medicine that replaces the thyroid hormones that your body does not make. After you begin treatment, it may take several weeks for symptoms to go away. Follow these instructions at home:  Take over-the-counter and prescription medicines only as told by your health care provider.  If you start taking any new medicines, tell your health care provider.  Keep all follow-up  visits as told by your health care provider. This is important. ? As your condition improves, your dosage of thyroid hormone medicine may change. ? You will need to have blood tests regularly so that your health care provider can monitor your condition. Contact a health care provider if:  Your symptoms do not get better with treatment.  You are taking thyroid replacement medicine and you: ? Sweat a lot. ? Have  tremors. ? Feel anxious. ? Lose weight rapidly. ? Cannot tolerate heat. ? Have emotional swings. ? Have diarrhea. ? Feel weak. Get help right away if you have:  Chest pain.  An irregular heartbeat.  A rapid heartbeat.  Difficulty breathing. Summary  Hypothyroidism is when the thyroid gland does not make enough of certain hormones (it is underactive).  When the thyroid is underactive, it produces too little of the hormones thyroxine (T4) and triiodothyronine (T3).  The most common cause is Hashimoto's disease, a disease in which the body's disease-fighting system (immune system) attacks the thyroid gland. The condition can also be caused by viral infections, medicine, pregnancy, or past radiation treatment to the head or neck.  Symptoms may include weight gain, dry skin, constipation, feeling as though you do not have energy, and not being able to tolerate cold.  This condition is treated with medicine to replace the thyroid hormones that your body does not make. This information is not intended to replace advice given to you by your health care provider. Make sure you discuss any questions you have with your health care provider. Document Released: 02/10/2005 Document Revised: 01/21/2017 Document Reviewed: 01/21/2017 Elsevier Interactive Patient Education  2019 [employer].

## 2018-08-02 NOTE — Progress Notes (Signed)
Done

## 2018-08-12 ENCOUNTER — Telehealth: Payer: Self-pay | Admitting: *Deleted

## 2018-08-12 ENCOUNTER — Other Ambulatory Visit: Payer: Self-pay | Admitting: *Deleted

## 2018-08-12 DIAGNOSIS — E119 Type 2 diabetes mellitus without complications: Secondary | ICD-10-CM

## 2018-08-12 MED ORDER — METFORMIN HCL ER 500 MG PO TB24: ORAL_TABLET | ORAL | 0 refills | Status: DC

## 2018-08-12 NOTE — Telephone Encounter (Signed)
The response for these recalls--if they have an affected lot, does the pharmacy carry any that were not recalled. If not, we should check with another pharmacy.  If not available at other pharmacies, then we need to change the medication to the short-acting metformin.  But as far as I know, this isn't an issue across all pharmacies yet.

## 2018-08-12 NOTE — Telephone Encounter (Signed)
Patient called and stated that [facility] called him and let him know that his metformin has been recalled and that he should contact his doctor.

## 2018-09-22 ENCOUNTER — Encounter: Payer: Self-pay | Admitting: Family Medicine

## 2018-09-22 LAB — HM DIABETES EYE EXAM

## 2018-10-20 ENCOUNTER — Other Ambulatory Visit: Payer: Self-pay | Admitting: Family Medicine

## 2018-10-20 DIAGNOSIS — N400 Enlarged prostate without lower urinary tract symptoms: Secondary | ICD-10-CM

## 2018-11-02 ENCOUNTER — Other Ambulatory Visit: Payer: Self-pay | Admitting: Family Medicine

## 2018-11-02 DIAGNOSIS — E119 Type 2 diabetes mellitus without complications: Secondary | ICD-10-CM

## 2018-11-02 NOTE — Telephone Encounter (Signed)
Pt has an appt in november 

## 2018-12-22 ENCOUNTER — Other Ambulatory Visit (INDEPENDENT_AMBULATORY_CARE_PROVIDER_SITE_OTHER): Payer: Medicare Other

## 2018-12-22 ENCOUNTER — Other Ambulatory Visit: Payer: Self-pay

## 2018-12-22 DIAGNOSIS — Z23 Encounter for immunization: Secondary | ICD-10-CM

## 2018-12-22 DIAGNOSIS — E038 Other specified hypothyroidism: Secondary | ICD-10-CM

## 2018-12-22 DIAGNOSIS — E7800 Pure hypercholesterolemia, unspecified: Secondary | ICD-10-CM

## 2018-12-22 DIAGNOSIS — E1169 Type 2 diabetes mellitus with other specified complication: Secondary | ICD-10-CM

## 2018-12-22 DIAGNOSIS — Z5181 Encounter for therapeutic drug level monitoring: Secondary | ICD-10-CM

## 2018-12-22 DIAGNOSIS — E039 Hypothyroidism, unspecified: Secondary | ICD-10-CM

## 2018-12-23 LAB — COMPREHENSIVE METABOLIC PANEL
ALT: 40 IU/L (ref 0–44)
AST: 29 IU/L (ref 0–40)
Albumin/Globulin Ratio: 1.7 (ref 1.2–2.2)
Albumin: 4.1 g/dL (ref 3.6–4.6)
Alkaline Phosphatase: 237 IU/L — ABNORMAL HIGH (ref 39–117)
BUN/Creatinine Ratio: 19 (ref 10–24)
BUN: 20 mg/dL (ref 8–27)
Bilirubin Total: 0.6 mg/dL (ref 0.0–1.2)
CO2: 22 mmol/L (ref 20–29)
Calcium: 9.5 mg/dL (ref 8.6–10.2)
Chloride: 102 mmol/L (ref 96–106)
Creatinine, Ser: 1.07 mg/dL (ref 0.76–1.27)
GFR calc Af Amer: 73 mL/min/{1.73_m2} (ref >59)
GFR calc non Af Amer: 63 mL/min/{1.73_m2} (ref >59)
Globulin, Total: 2.4 g/dL (ref 1.5–4.5)
Glucose: 130 mg/dL — ABNORMAL HIGH (ref 65–99)
Potassium: 4.6 mmol/L (ref 3.5–5.2)
Sodium: 140 mmol/L (ref 134–144)
Total Protein: 6.5 g/dL (ref 6.0–8.5)

## 2018-12-23 LAB — LIPID PANEL
Chol/HDL Ratio: 2.3 ratio (ref 0.0–5.0)
Cholesterol, Total: 93 mg/dL — ABNORMAL LOW (ref 100–199)
HDL: 40 mg/dL (ref >39)
LDL Chol Calc (NIH): 39 mg/dL (ref 0–99)
Triglycerides: 63 mg/dL (ref 0–149)
VLDL Cholesterol Cal: 14 mg/dL (ref 5–40)

## 2018-12-23 LAB — HEMOGLOBIN A1C
Est. average glucose Bld gHb Est-mCnc: 143 mg/dL
Hgb A1c MFr Bld: 6.6 % — ABNORMAL HIGH (ref 4.8–5.6)

## 2018-12-23 LAB — MICROALBUMIN / CREATININE URINE RATIO
Creatinine, Urine: 58.8 mg/dL
Microalb/Creat Ratio: 41 mg/g creat — ABNORMAL HIGH (ref 0–29)
Microalbumin, Urine: 24.4 ug/mL

## 2018-12-23 LAB — TSH: TSH: 6.31 u[IU]/mL — ABNORMAL HIGH (ref 0.450–4.500)

## 2018-12-26 NOTE — Patient Instructions (Signed)
  HEALTH MAINTENANCE RECOMMENDATIONS:  It is recommended that you get at least 30 minutes of aerobic exercise at least 5 days/week (for weight loss, you may need as much as 60-90 minutes). This can be any activity that gets your heart rate up. This can be divided in 10-15 minute intervals if needed, but try and build up your endurance at least once a week.  Weight bearing exercise is also recommended twice weekly.  Eat a healthy diet with lots of vegetables, fruits and fiber.  "Colorful" foods have a lot of vitamins (ie green vegetables, tomatoes, red peppers, etc).  Limit sweet tea, regular sodas and alcoholic beverages, all of which has a lot of calories and sugar.  Up to 2 alcoholic drinks daily may be beneficial for men (unless trying to lose weight, watch sugars).  Drink a lot of water.  Sunscreen of at least SPF 30 should be used on all sun-exposed parts of the skin when outside between the hours of 10 am and 4 pm (not just when at beach or pool, but even with exercise, golf, tennis, and yard work!)  Use a sunscreen that says "broad spectrum" so it covers both UVA and UVB rays, and make sure to reapply every 1-2 hours.  Remember to change the batteries in your smoke detectors when changing your clock times in the spring and fall. Carbon monoxide detectors are recommended for your home.  Use your seat belt every time you are in a car, and please drive safely and not be distracted with cell phones and texting while driving.   Andre Walker , Thank you for taking time to come for your Medicare Wellness Visit. I appreciate your ongoing commitment to your health goals. Please review the following plan we discussed and let me know if I can assist you in the future.    This is a list of the screening recommended for you and due dates:  Health Maintenance  Topic Date Due  . Complete foot exam   12/10/2018  . Hemoglobin A1C  06/22/2019  . Eye exam for diabetics  09/22/2019  . Tetanus Vaccine  12/01/2020   . Flu Shot  Completed  . Pneumonia vaccines  Completed   Your diabetic foot exam was done today.

## 2018-12-26 NOTE — Progress Notes (Signed)
Chief Complaint  Patient presents with  . Medicare Wellness    nonfasting AWV. Only concern is that he feels like his balance is not as great as it used to be.     Andre Walker is a 85 y.o. male who presents for annual wellness visit and follow-up on chronic medical conditions.  He had labs done prior to his visit (see below.)   Balance doesn't seem as good.  Doesn't feel as steady when walking.  It isn't every day he feels this way, comes and goes.  Denies any allergies, congestion.  Diabetes follow-up: Blood sugars at home are running104-135 in the mornings (usually 115-125).  Denies hypoglycemia. Denies polydipsia and polyuria. He reports compliance with metformin and denies side effects.   Last eye exam was in July, 2020, no retinopathy. Patient follows a low sugar diet and checks feet regularly without concerns. Denies numbness, tingling, burning or sores. Has thickened fungal toenails, denies any pain/discomfort. He has h/o microalbuminuria, and continues on lisinopril without side effects. Denies dizziness or cough.  BP's at home run 115 (110-120)/60 range at home, consistently.   +weight loss--related to smaller portions now that food is being delivered.  Hyperlipidemia follow-up: Patient is reportedly following a low-fat, low cholesterol diet. Compliant with medications (simvastatin) and denies medication side effects. See below for recent labs.  BPH: Under the care of [facility] (since getting admitted 01/2017, had UTI, AKI).  Due to some obstructive symptoms, finasteride was added to his hytrin.He last saw urologist Dr. Gilford Rile in 09/2018.  (pt states had urine checked, no exam done). He no longer takes the finasteride (not sure exactly why it was stopped, but hasn't noticed any change--thinks it was stopped about a year ago). (last notes from urologist received 09/2017) Normal stream, empties bladder well. Up 1-2x/night to void.  He has had some borderline and mildly  elevated TSH values in the past,never symptomatic. He denies fatigue, cold intolerance, hair/skin changes. He has had weight loss since decreased meal size/food intake since pandemic (food being delivered, limited portions).   Immunization History  Administered Date(s) Administered  . Fluad Quad(high Dose 65+) 12/22/2018  . Influenza Split 12/02/2010, 12/03/2011  . Influenza, High Dose Seasonal PF 12/01/2012, 12/12/2013, 12/06/2014, 11/13/2015, 11/26/2016, 12/07/2017  . Pneumococcal Conjugate-13 09/12/2013  . Pneumococcal Polysaccharide-23 12/25/2004, 06/04/2011, 12/03/2016  . Td 04/24/2004  . Tdap 12/02/2010  . Zoster 10/23/2007  . Zoster Recombinat (Shingrix) 06/30/2017, 10/11/2017   Last colonoscopy:12/2015, showing int/external hemorrhoids, hyperplastic polyp and diverticulosis Last PSA: no longer being screened; under care of urologist. Dentist: twice yearly Ophtho: yearly,last in July Exercise: Walking at least 30 minutes daily. Gym just reopened 3 weeks ago, going 3x/week for an hour.  Does weight machines. Golfed for the first time since March last week.   Other doctors caring for patient include: Dr. Lovena Neighbours (urologist) Dr. Hassell Done (general surgeon, did melanoma excision, no regular f/u)  Dr. Pearline Cables (dermatologist) --sees every 6 months Dr. Katy Fitch (ophtho) --yearly Dr. Lavone Neri (dentist) --twice yearly Dr. Watt Climes (GI)  [facility]used [facility], didn't qualify for a year, back to qualifying, but hasn't been back Got hearing aids fixed at [facility].  Depression screen:  Negative Functional status survey: unremarkable Fall screen: negative. Mini-Cog screen: normal (score of 5)  End of Life Discussion: Patient hasa living will and medical power of attorney  Past Medical History:  Diagnosis Date  . Actinic keratosis    Dr. Allyson Sabal  . BPH (benign prostatic hyperplasia)    controlled  on Hytrin  . Colon polyps    Dr. Watt Climes  . Diabetes mellitus 2004  . ED  (erectile dysfunction)   . Hearing loss    high frequency  . Hiatal hernia    small, noted on EGD 10/2010  . Melanoma ([place])    right arm  . Microalbuminuria    on ACEI for microalbuminuria (does NOT have HTN)  . Pure hypercholesterolemia 2006  . Traumatic partial tear of biceps tendon 01/2009   "popeye" injury on right  . Wears glasses   . Wears partial dentures    lower    Past Surgical History:  Procedure Laterality Date  . APPENDECTOMY  1955  . CATARACT EXTRACTION Bilateral R 05/07/15, L 05/21/15   Dr. Katy Fitch  . COLONOSCOPY  11/09, 10/29/10   Dr. Watt Climes  . ESOPHAGOGASTRODUODENOSCOPY  10/29/10   Dr. Watt Climes  . INGUINAL HERNIA REPAIR Right 1989  . MELANOMA EXCISION  10/08/10   R upper arm, Dr. Hassell Done  . MELANOMA EXCISION  03/16/2012   Procedure: MELANOMA EXCISION;  Surgeon: Pedro Earls, MD;  Location: [facility];  Service: General;  Laterality: Right;  excision of melanoma of right arm  . TOTAL KNEE ARTHROPLASTY Right 2012  . TOTAL KNEE ARTHROPLASTY Left 2005    Social History   Socioeconomic History  . Marital status: Married    Spouse name: Not on file  . Number of children: 2  . Years of education: Not on file  . Highest education level: Not on file  Occupational History  . Occupation: Retired (from post office)  Social Needs  . Financial resource strain: Not on file  . Food insecurity    Worry: Not on file    Inability: Not on file  . Transportation needs    Medical: Not on file    Non-medical: Not on file  Tobacco Use  . Smoking status: Never Smoker  . Smokeless tobacco: Never Used  Substance and Sexual Activity  . Alcohol use: Yes    Alcohol/week: 2.0 - 3.0 standard drinks    Types: 2 - 3 Glasses of wine per week    Comment: 1 glass of wine 1-2 times per month.  . Drug use: No  . Sexual activity: Not Currently    Partners: Female  Lifestyle  . Physical activity    Days per week: Not on file    Minutes per session: Not on file  .  Stress: Not on file  Relationships  . Social Herbalist on phone: Not on file    Gets together: Not on file    Attends religious service: Not on file    Active member of club or organization: Not on file    Attends meetings of clubs or organizations: Not on file    Relationship status: Not on file  . Intimate partner violence    Fear of current or ex partner: Not on file    Emotionally abused: Not on file    Physically abused: Not on file    Forced sexual activity: Not on file  Other Topics Concern  . Not on file  Social History Narrative   1 son in [place], 1 son in [place].    Previously did handyman work (very little now).     Moved to [place] 09/2012   3 grandchildren    Family History  Problem Relation Age of Onset  . Diabetes Sister   . Diabetes Brother   .  Heart disease Brother   . Hyperlipidemia Brother   . Hypertension Brother   . Stroke Brother   . Diabetes Sister   . Cancer Paternal Uncle   . Heart disease Paternal Uncle     Outpatient Encounter Medications as of 12/27/2018  Medication Sig Note  . aspirin 81 MG tablet Take 81 mg by mouth daily.     . Calcium Carbonate-Vitamin D (CALCIUM 600+D HIGH POTENCY) 600-400 MG-UNIT per tablet Take 1 tablet by mouth 2 (two) times daily.    . Coenzyme Q10 (COQ10) 100 MG CAPS Take 1 capsule by mouth daily.     Marland Kitchen glucosamine-chondroitin 500-400 MG tablet Take 2 tablets by mouth daily.    Marland Kitchen glucose blood test strip 1 each by Other route daily. Use as instructed   . lisinopril (ZESTRIL) 5 MG tablet Take 1 tablet (5 mg total) by mouth daily.   Marland Kitchen loratadine (CLARITIN) 10 MG tablet Take 10 mg by mouth daily.   . metFORMIN (GLUCOPHAGE-XR) 500 MG 24 hr tablet TAKE 2 TABLETS BY MOUTH ONCE DAILY WITH BREAKFAST   . Multiple Vitamins-Minerals (MULTIVITAMIN WITH MINERALS) tablet Take 1 tablet by mouth daily.     . Omega-3 Fatty Acids (FISH OIL PO) Take 1,600 mg by mouth daily.   Glory Rosebush DELICA LANCETS  12I MISC 1 each by Does not apply route daily.   . simvastatin (ZOCOR) 20 MG tablet Take 1 tablet (20 mg total) by mouth at bedtime.   Marland Kitchen terazosin (HYTRIN) 5 MG capsule Take 1 capsule by mouth at bedtime   . acetaminophen (TYLENOL) 325 MG tablet Take 650 mg by mouth every 6 (six) hours as needed.   . bismuth subsalicylate (PEPTO BISMOL) 262 MG/15ML suspension Take 30 mLs by mouth every 6 (six) hours as needed.   . finasteride (PROSCAR) 5 MG tablet Take 5 mg by mouth daily.  12/27/2018: Pt states this was stopped by urologist  . ondansetron (ZOFRAN ODT) 4 MG disintegrating tablet Take 1 tablet (4 mg total) by mouth every 8 (eight) hours as needed for nausea or vomiting. (Patient not taking: Reported on 08/02/2018)    No facility-administered encounter medications on file as of 12/27/2018.     Allergies  Allergen Reactions  . Penicillins Rash    Has patient had a PCN reaction causing immediate rash, facial/tongue/throat swelling, SOB or lightheadedness with hypotension: No Has patient had a PCN reaction causing severe rash involving mucus membranes or skin necrosis: No Has patient had a PCN reaction that required hospitalization: No Has patient had a PCN reaction occurring within the last 10 years: No If all of the above answers are "NO", then may proceed with Cephalosporin use.     ROS: The patient denies anorexia, fever, weight changes, headaches, vision loss, ear pain, hoarseness, chest pain, palpitations, dizziness, syncope, dyspnea on exertion, cough, swelling, nausea, vomiting, diarrhea, constipation, abdominal pain, melena, hematochezia, indigestion/heartburn, hematuria, incontinence, nocturia (just once or twice a night), weakened urine stream, dysuria, genital lesions,numbness, tingling, weakness, tremor, rashes, depression, anxiety, abnormal bleeding/bruising (easily bruises if he bumps his arms), or enlarged lymph nodes.  Rare heartburn, about once a month (spicy, spaghetti), uses Tums  prn, rarely. 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 11-12# weight loss in the last year, related to eating less at meals since food is delivered (and portion controlled).   Intermittently feels like his balance is a little off. Denies falls.   PHYSICAL EXAM:  BP (!) 114/58   Pulse 68  Temp (!) 97.5 F (36.4 C) (Other (Comment))   Ht 5' 10"  (1.778 m)   Wt 187 lb (84.8 kg)   BMI 26.83 kg/m   Wt Readings from Last 3 Encounters:  12/27/18 187 lb (84.8 kg)  08/02/18 187 lb (84.8 kg)  03/01/18 198 lb 3.2 oz (89.9 kg)    General Appearance:  Alert, cooperative, no distress, appears stated age   Head:  Normocephalic, without obvious abnormality, atraumatic   Eyes:  PERRL, conjunctiva/corneas clear, EOM's intact, fundinormal  Ears:  TM's and EAC's normal.  Has hearing aids bilaterally  Nose:  Not examined, wearing mask due to COVID-19 pandemic  Throat:  Not examined, wearing mask due to COVID-19 pandemic  Neck:  Supple, no lymphadenopathy; thyroid: no enlargement/ tenderness/nodules; no carotid bruit or JVD   Back:  Spine nontender, no curvature, ROM normal, no CVA tenderness. Scoliosis and prominence/kyphosis of the lower lumbar spine. nontender.  Lungs:  Clear to auscultation bilaterally without wheezes, rales or ronchi; respirations unlabored   Chest Wall:  No tenderness or deformity   Heart:  Regularrate andrhythm,S1 and S2 normal, no murmur, rub or gallop.  Breast Exam:  No chest wall tenderness, masses or gynecomastia   Abdomen:  Soft, non-tender, nondistended, normoactive bowel sounds, no masses, no hepatosplenomegaly. Small, soft, easily reducible umbilical hernia  Genitalia:  Normal male external genitalia without lesions. Testicles without masses. No inguinal hernias.   Rectal:  Normal sphincter tone, no masses or tenderness; very soft, guaiac negative stool. Prostate smooth, no nodules, mildly enlarged(more  symmetric than noted in the past, R minimally larger than left now). No nodules   Extremities:  No clubbing, cyanosis or edema   Pulses:  2+ and symmetric all extremities   Skin:  Skin color, texture, turgor normal, no rashes. Senile purpura on his arms. Many AK's on forearms and legsbilaterally. Onychomycotic 1st and 5thtoenails bilaterally.Normal monofilament sensation  Lymph nodes:  Cervical, supraclavicular, and axillary nodes normal   Neurologic:  CNII-XII intact, normal strength, sensation and gait; reflexes 2+ and symmetric throughout   Psych: Normal mood, affect, hygiene and grooming  Diabetic foot exam performed.--normal monofilament.  Thickened, onychomycotic toenails with some thickening and hyperkeratosis of the skin at the distal great toes.  No ingrowing nails, drainage, redness.   Lab Results  Component Value Date   HGBA1C 6.6 (H) 12/22/2018     Chemistry      Component Value Date/Time   NA 140 12/22/2018 0830   K 4.6 12/22/2018 0830   CL 102 12/22/2018 0830   CO2 22 12/22/2018 0830   BUN 20 12/22/2018 0830   CREATININE 1.07 12/22/2018 0830   CREATININE 1.07 01/29/2017 1242      Component Value Date/Time   CALCIUM 9.5 12/22/2018 0830   ALKPHOS 237 (H) 12/22/2018 0830   ALKPHOS 229 (H) 12/22/2018 0830   AST 29 12/22/2018 0830   ALT 40 12/22/2018 0830   BILITOT 0.6 12/22/2018 0830     Fasting glu 130  Lab Results  Component Value Date   CHOL 93 (L) 12/22/2018   HDL 40 12/22/2018   LDLCALC 39 12/22/2018   TRIG 63 12/22/2018   CHOLHDL 2.3 12/22/2018   Lab Results  Component Value Date   TSH 6.310 (H) 12/22/2018   Urine microalb/Cr ratio 41 (elevated)  ASSESSMENT/PLAN:  Medicare annual wellness visit, subsequent  Diabetes mellitus with microalbuminuria ([place]) - microalbuminuria has recurred.  A1c and BP's are good, no changes made, may not tolerate higher dose of lisinopril.  Type 2 diabetes mellitus with  hypercholesterolemia ([place])  Subclinical hypothyroidism - remains asymptomatic; cont to monitor, reviewed symptoms to look for  Pure hypercholesterolemia - continue statin  Benign prostatic hyperplasia, unspecified whether lower urinary tract symptoms present - doing well on hytrin. cont to see urologist (ask for notes with next visit)  Senile purpura ([place])  Elevated serum alkaline phosphatase level - Ddx reviewed.  Recheck in 1 month; [place] of liver if persists/worsens - Plan: Hepatic function panel   Denies needing refills, has at least a month of meds left, will call pharmacy when needed.  Recommended at least 30 minutes of aerobic activity at least 5 days/week; weight bearing exercise at least 2x/week; proper sunscreen use reviewed; healthy diet and alcohol recommendations (less than or equal to 2 drinks/day) reviewed; regular seatbelt use; changing batteries in smoke detectors. Immunization recommendations discussed--continue yearly high dose flu shots. Colonoscopy recommendations reviewed,UTD.  MOST form reviewed and updated. Full Code, Full Care.   F/u 6 mos, med check. (should need A1c, glu, LFT's CBC, and TSH.  Not ordering now, since coming in 1 mo for labs, and might get released in error.   1 month f/u repeat LFT's to recheck alk phos (nonfasting lab visit) If remains elevated, needs US of the liver.   Medicare Attestation I have personally reviewed: The patient's medical and social history Their use of alcohol, tobacco or illicit drugs Their current medications and supplements The patient's functional ability including ADLs,fall risks, home safety risks, cognitive, and hearing and visual impairment Diet and physical activities Evidence for depression or mood disorders  The patient's weight, height, BMI, and visual acuity have been recorded in the chart.  I have made referrals, counseling, and provided education to the patient based on review of the above and I have  provided the patient with a written personalized care plan for preventive services.

## 2018-12-27 ENCOUNTER — Ambulatory Visit (INDEPENDENT_AMBULATORY_CARE_PROVIDER_SITE_OTHER): Payer: Medicare Other | Admitting: Family Medicine

## 2018-12-27 ENCOUNTER — Encounter: Payer: Self-pay | Admitting: Family Medicine

## 2018-12-27 ENCOUNTER — Other Ambulatory Visit: Payer: Self-pay

## 2018-12-27 VITALS — BP 114/58 | HR 68 | Temp 97.5°F | Ht 70.0 in | Wt 187.0 lb

## 2018-12-27 DIAGNOSIS — E7800 Pure hypercholesterolemia, unspecified: Secondary | ICD-10-CM | POA: Diagnosis not present

## 2018-12-27 DIAGNOSIS — R809 Proteinuria, unspecified: Secondary | ICD-10-CM

## 2018-12-27 DIAGNOSIS — D692 Other nonthrombocytopenic purpura: Secondary | ICD-10-CM

## 2018-12-27 DIAGNOSIS — Z0000 Encounter for general adult medical examination without abnormal findings: Secondary | ICD-10-CM | POA: Diagnosis not present

## 2018-12-27 DIAGNOSIS — E038 Other specified hypothyroidism: Secondary | ICD-10-CM

## 2018-12-27 DIAGNOSIS — E1129 Type 2 diabetes mellitus with other diabetic kidney complication: Secondary | ICD-10-CM

## 2018-12-27 DIAGNOSIS — E039 Hypothyroidism, unspecified: Secondary | ICD-10-CM | POA: Diagnosis not present

## 2018-12-27 DIAGNOSIS — R748 Abnormal levels of other serum enzymes: Secondary | ICD-10-CM

## 2018-12-27 DIAGNOSIS — N400 Enlarged prostate without lower urinary tract symptoms: Secondary | ICD-10-CM

## 2018-12-27 DIAGNOSIS — E1169 Type 2 diabetes mellitus with other specified complication: Secondary | ICD-10-CM | POA: Diagnosis not present

## 2019-01-04 LAB — ALKALINE PHOSPHATASE, ISOENZYMES
Alkaline Phosphatase: 229 IU/L — ABNORMAL HIGH (ref 39–117)
BONE FRACTION: 17 % (ref 12–68)
INTESTINAL FRAC.: 0 % (ref 0–18)
LIVER FRACTION: 83 % (ref 13–88)

## 2019-01-04 LAB — SPECIMEN STATUS REPORT

## 2019-01-17 ENCOUNTER — Other Ambulatory Visit: Payer: Self-pay | Admitting: Family Medicine

## 2019-01-17 DIAGNOSIS — N400 Enlarged prostate without lower urinary tract symptoms: Secondary | ICD-10-CM

## 2019-01-24 ENCOUNTER — Encounter (HOSPITAL_COMMUNITY): Payer: Self-pay

## 2019-01-24 ENCOUNTER — Emergency Department (HOSPITAL_COMMUNITY): Payer: Medicare Other

## 2019-01-24 ENCOUNTER — Inpatient Hospital Stay (HOSPITAL_COMMUNITY)
Admission: EM | Admit: 2019-01-24 | Discharge: 2019-01-29 | DRG: 417 | Disposition: A | Discharge status: Home / Self Care | Payer: Medicare Other | Attending: Internal Medicine | Admitting: Internal Medicine

## 2019-01-24 ENCOUNTER — Other Ambulatory Visit: Payer: Self-pay

## 2019-01-24 ENCOUNTER — Telehealth: Payer: Self-pay | Admitting: Family Medicine

## 2019-01-24 DIAGNOSIS — E1129 Type 2 diabetes mellitus with other diabetic kidney complication: Secondary | ICD-10-CM | POA: Diagnosis present

## 2019-01-24 DIAGNOSIS — Z419 Encounter for procedure for purposes other than remedying health state, unspecified: Secondary | ICD-10-CM

## 2019-01-24 DIAGNOSIS — E02 Subclinical iodine-deficiency hypothyroidism: Secondary | ICD-10-CM | POA: Diagnosis present

## 2019-01-24 DIAGNOSIS — Z8249 Family history of ischemic heart disease and other diseases of the circulatory system: Secondary | ICD-10-CM

## 2019-01-24 DIAGNOSIS — K8051 Calculus of bile duct without cholangitis or cholecystitis with obstruction: Secondary | ICD-10-CM | POA: Diagnosis present

## 2019-01-24 DIAGNOSIS — E7800 Pure hypercholesterolemia, unspecified: Secondary | ICD-10-CM | POA: Diagnosis present

## 2019-01-24 DIAGNOSIS — R531 Weakness: Secondary | ICD-10-CM

## 2019-01-24 DIAGNOSIS — E785 Hyperlipidemia, unspecified: Secondary | ICD-10-CM | POA: Diagnosis present

## 2019-01-24 DIAGNOSIS — I482 Chronic atrial fibrillation, unspecified: Secondary | ICD-10-CM | POA: Diagnosis present

## 2019-01-24 DIAGNOSIS — I959 Hypotension, unspecified: Secondary | ICD-10-CM | POA: Diagnosis present

## 2019-01-24 DIAGNOSIS — I4892 Unspecified atrial flutter: Secondary | ICD-10-CM | POA: Diagnosis present

## 2019-01-24 DIAGNOSIS — Z8719 Personal history of other diseases of the digestive system: Secondary | ICD-10-CM

## 2019-01-24 DIAGNOSIS — K851 Biliary acute pancreatitis without necrosis or infection: Secondary | ICD-10-CM | POA: Diagnosis present

## 2019-01-24 DIAGNOSIS — I10 Essential (primary) hypertension: Secondary | ICD-10-CM | POA: Diagnosis present

## 2019-01-24 DIAGNOSIS — D692 Other nonthrombocytopenic purpura: Secondary | ICD-10-CM | POA: Diagnosis present

## 2019-01-24 DIAGNOSIS — Z85820 Personal history of malignant melanoma of skin: Secondary | ICD-10-CM

## 2019-01-24 DIAGNOSIS — K810 Acute cholecystitis: Secondary | ICD-10-CM

## 2019-01-24 DIAGNOSIS — Z833 Family history of diabetes mellitus: Secondary | ICD-10-CM

## 2019-01-24 DIAGNOSIS — I248 Other forms of acute ischemic heart disease: Secondary | ICD-10-CM | POA: Diagnosis present

## 2019-01-24 DIAGNOSIS — E860 Dehydration: Secondary | ICD-10-CM | POA: Diagnosis not present

## 2019-01-24 DIAGNOSIS — K8050 Calculus of bile duct without cholangitis or cholecystitis without obstruction: Secondary | ICD-10-CM

## 2019-01-24 DIAGNOSIS — K8012 Calculus of gallbladder with acute and chronic cholecystitis without obstruction: Secondary | ICD-10-CM | POA: Diagnosis not present

## 2019-01-24 DIAGNOSIS — N179 Acute kidney failure, unspecified: Secondary | ICD-10-CM | POA: Diagnosis present

## 2019-01-24 DIAGNOSIS — J189 Pneumonia, unspecified organism: Secondary | ICD-10-CM | POA: Diagnosis present

## 2019-01-24 DIAGNOSIS — E1151 Type 2 diabetes mellitus with diabetic peripheral angiopathy without gangrene: Secondary | ICD-10-CM | POA: Diagnosis present

## 2019-01-24 DIAGNOSIS — Z96653 Presence of artificial knee joint, bilateral: Secondary | ICD-10-CM | POA: Diagnosis present

## 2019-01-24 DIAGNOSIS — Z20828 Contact with and (suspected) exposure to other viral communicable diseases: Secondary | ICD-10-CM | POA: Diagnosis present

## 2019-01-24 DIAGNOSIS — Z7984 Long term (current) use of oral hypoglycemic drugs: Secondary | ICD-10-CM

## 2019-01-24 DIAGNOSIS — R7989 Other specified abnormal findings of blood chemistry: Secondary | ICD-10-CM

## 2019-01-24 DIAGNOSIS — Z79899 Other long term (current) drug therapy: Secondary | ICD-10-CM

## 2019-01-24 DIAGNOSIS — I4891 Unspecified atrial fibrillation: Secondary | ICD-10-CM | POA: Diagnosis present

## 2019-01-24 DIAGNOSIS — Z880 Allergy status to penicillin: Secondary | ICD-10-CM

## 2019-01-24 DIAGNOSIS — H9190 Unspecified hearing loss, unspecified ear: Secondary | ICD-10-CM | POA: Diagnosis present

## 2019-01-24 DIAGNOSIS — N400 Enlarged prostate without lower urinary tract symptoms: Secondary | ICD-10-CM | POA: Diagnosis present

## 2019-01-24 DIAGNOSIS — Z7982 Long term (current) use of aspirin: Secondary | ICD-10-CM

## 2019-01-24 DIAGNOSIS — E11649 Type 2 diabetes mellitus with hypoglycemia without coma: Secondary | ICD-10-CM | POA: Diagnosis not present

## 2019-01-24 LAB — CBC WITH DIFFERENTIAL/PLATELET
Abs Immature Granulocytes: 0.09 10*3/uL — ABNORMAL HIGH (ref 0.00–0.07)
Basophils Absolute: 0 10*3/uL (ref 0.0–0.1)
Basophils Relative: 0 %
Eosinophils Absolute: 0 10*3/uL (ref 0.0–0.5)
Eosinophils Relative: 0 %
HCT: 34.4 % — ABNORMAL LOW (ref 39.0–52.0)
Hemoglobin: 11.3 g/dL — ABNORMAL LOW (ref 13.0–17.0)
Immature Granulocytes: 1 %
Lymphocytes Relative: 4 %
Lymphs Abs: 0.5 10*3/uL — ABNORMAL LOW (ref 0.7–4.0)
MCH: 32.4 pg (ref 26.0–34.0)
MCHC: 32.8 g/dL (ref 30.0–36.0)
MCV: 98.6 fL (ref 80.0–100.0)
Monocytes Absolute: 0.9 10*3/uL (ref 0.1–1.0)
Monocytes Relative: 7 %
Neutro Abs: 10.8 10*3/uL — ABNORMAL HIGH (ref 1.7–7.7)
Neutrophils Relative %: 88 %
Platelets: 252 10*3/uL (ref 150–400)
RBC: 3.49 MIL/uL — ABNORMAL LOW (ref 4.22–5.81)
RDW: 13 % (ref 11.5–15.5)
WBC: 12.2 10*3/uL — ABNORMAL HIGH (ref 4.0–10.5)
nRBC: 0 % (ref 0.0–0.2)

## 2019-01-24 LAB — COMPREHENSIVE METABOLIC PANEL
ALT: 137 U/L — ABNORMAL HIGH (ref 0–44)
AST: 87 U/L — ABNORMAL HIGH (ref 15–41)
Albumin: 3.2 g/dL — ABNORMAL LOW (ref 3.5–5.0)
Alkaline Phosphatase: 622 U/L — ABNORMAL HIGH (ref 38–126)
Anion gap: 11 (ref 5–15)
BUN: 39 mg/dL — ABNORMAL HIGH (ref 8–23)
CO2: 20 mmol/L — ABNORMAL LOW (ref 22–32)
Calcium: 8.3 mg/dL — ABNORMAL LOW (ref 8.9–10.3)
Chloride: 104 mmol/L (ref 98–111)
Creatinine, Ser: 1.3 mg/dL — ABNORMAL HIGH (ref 0.61–1.24)
GFR calc Af Amer: 58 mL/min — ABNORMAL LOW (ref >60)
GFR calc non Af Amer: 50 mL/min — ABNORMAL LOW (ref >60)
Glucose, Bld: 191 mg/dL — ABNORMAL HIGH (ref 70–99)
Potassium: 3.4 mmol/L — ABNORMAL LOW (ref 3.5–5.1)
Sodium: 135 mmol/L (ref 135–145)
Total Bilirubin: 2.4 mg/dL — ABNORMAL HIGH (ref 0.3–1.2)
Total Protein: 6.2 g/dL — ABNORMAL LOW (ref 6.5–8.1)

## 2019-01-24 LAB — LACTIC ACID, PLASMA: Lactic Acid, Venous: 2.2 mmol/L (ref 0.5–1.9)

## 2019-01-24 LAB — CBG MONITORING, ED: Glucose-Capillary: 161 mg/dL — ABNORMAL HIGH (ref 70–99)

## 2019-01-24 MED ORDER — IOHEXOL 300 MG/ML  SOLN
100.0000 mL | Freq: Once | INTRAMUSCULAR | Status: AC | PRN
  Administered 2019-01-24: 23:00:00 100 mL via INTRAVENOUS

## 2019-01-24 MED ORDER — SODIUM CHLORIDE (PF) 0.9 % IJ SOLN
INTRAMUSCULAR | Status: AC
  Filled 2019-01-24: qty 50

## 2019-01-24 MED ORDER — SODIUM CHLORIDE 0.9 % IV BOLUS
1000.0000 mL | Freq: Once | INTRAVENOUS | Status: AC
  Administered 2019-01-25: 1000 mL via INTRAVENOUS

## 2019-01-24 MED ORDER — SODIUM CHLORIDE 0.9 % IV BOLUS
1000.0000 mL | Freq: Once | INTRAVENOUS | Status: AC
  Administered 2019-01-24: 1000 mL via INTRAVENOUS

## 2019-01-24 NOTE — ED Triage Notes (Signed)
Pt BIB EMS from EMS from Primary care doctor. PT has been feeling weak for several days. Pt normal ambulatory. Pt has been having trouble ambulated. PT stated he just feels weak and only feels like laying in bed. PT denies any pain. Pt had one episode of vomiting at doctors office before transport. Pt and wife lives in assisted living [facility]. PT has equal strength on both sides able to stand and turn form ems stretcher to bed.

## 2019-01-24 NOTE — ED Notes (Signed)
CRITICAL VALUE STICKER  CRITICAL VALUE: Lactic Acid 2.2  DATE & TIME NOTIFIED: 2328  MD NOTIFIED: [place]  TIME OF NOTIFICATION: 2330

## 2019-01-24 NOTE — ED Provider Notes (Signed)
[place] DEPT Provider Note   CSN: [id number] Arrival date & time: 01/24/19  2033     History   Chief Complaint Chief Complaint  Patient presents with   Weakness    HPI Andre Walker is a 85 y.o. male.     Pt presents to the ED today with generalized weakness.  The pt has been feeling weak for several days.  He said he has no strength and can no longer walk.  He did vomit at his pcp office while waiting for ems to come here.  Pt said he's been eating and drinking, but not much.  No fevers.  No known covid exposures.       Past Medical History:  Diagnosis Date   Actinic keratosis    Dr. Allyson Sabal   BPH (benign prostatic hyperplasia)    controlled on Hytrin   Colon polyps    Dr. Watt Climes   Diabetes mellitus 2004   ED (erectile dysfunction)    Hearing loss    high frequency   Hiatal hernia    small, noted on EGD 10/2010   Melanoma ([place])    right arm   Microalbuminuria    on ACEI for microalbuminuria (does NOT have HTN)   Pure hypercholesterolemia 2006   Traumatic partial tear of biceps tendon 01/2009   "popeye" injury on right   Wears glasses    Wears partial dentures    lower    Patient Active Problem List   Diagnosis Date Noted   Medication monitoring encounter 08/02/2018   Diabetes mellitus with microalbuminuria ([place]) 08/02/2018   Subclinical hypothyroidism 08/02/2018   Type 2 diabetes mellitus with hypercholesterolemia ([place]) 08/02/2018   Elevated serum creatinine    UTI (urinary tract infection) 01/24/2017   AKI (acute kidney injury) ([place]) 01/24/2017   Squamous cell carcinoma of leg, 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 Katy Fitch   COLONOSCOPY  11/09, 10/29/10   Dr. Watt Climes   ESOPHAGOGASTRODUODENOSCOPY  10/29/10   Dr. Watt Climes   INGUINAL HERNIA REPAIR Right 1989   MELANOMA EXCISION  10/08/10   R upper arm, Dr. Hassell Done   MELANOMA EXCISION  03/16/2012   Procedure: MELANOMA EXCISION;  Surgeon: Pedro Earls, MD;  Location: [place] [facility];  Service: General;  Laterality: Right;  excision of melanoma of right arm   TOTAL KNEE ARTHROPLASTY Right 2012   TOTAL KNEE ARTHROPLASTY Left 2005        Home Medications    Prior to Admission medications   Medication Sig Start Date End Date Taking? Authorizing Provider  acetaminophen (TYLENOL) 325 MG tablet Take 650 mg by mouth every 6 (six) hours as needed for mild pain, moderate pain or headache.    Yes [provider]  aspirin 81 MG tablet Take 81 mg by mouth daily.     Yes [provider]  Calcium Carbonate-Vitamin D (CALCIUM 600+D HIGH POTENCY) 600-400 MG-UNIT per tablet Take 1 tablet by mouth 2 (two) times daily.    Yes [provider]  cephALEXin (KEFLEX) 500 MG capsule Take 500 mg by mouth  4 (four) times daily. 01/23/19  Yes [provider]  Coenzyme Q10 (COQ10) 100 MG CAPS Take 1 capsule by mouth daily.     Yes [provider]  glucosamine-chondroitin 500-400 MG tablet Take 2 tablets by mouth daily.    Yes [provider]  glucose blood test strip 1 each by Other route daily. Use as instructed 12/09/17  Yes Rita Ohara, MD  lisinopril (ZESTRIL) 5 MG tablet Take 1 tablet (5 mg total) by mouth daily. 08/02/18  Yes Rita Ohara, MD  loratadine (CLARITIN) 10 MG tablet Take 10 mg by mouth daily.   Yes [provider]  metFORMIN (GLUCOPHAGE-XR) 500 MG 24 hr tablet TAKE 2 TABLETS BY MOUTH ONCE DAILY WITH BREAKFAST Patient taking differently: Take 1,000 mg  by mouth daily with breakfast.  11/02/18  Yes Rita Ohara, MD  Multiple Vitamins-Minerals (MULTIVITAMIN WITH MINERALS) tablet Take 1 tablet by mouth daily.     Yes [provider]  Omega-3 Fatty Acids (FISH OIL PO) Take 1,600 mg by mouth daily.   Yes [provider]  [facility] DELICA LANCETS [id number] MISC 1 each by Does not apply route daily. 12/09/17  Yes Rita Ohara, MD  simvastatin (ZOCOR) 20 MG tablet Take 1 tablet (20 mg total) by mouth at bedtime. 08/02/18  Yes Rita Ohara, MD  terazosin (HYTRIN) 5 MG capsule Take 1 capsule by mouth at bedtime 01/17/19  Yes Rita Ohara, MD  ondansetron (ZOFRAN ODT) 4 MG disintegrating tablet Take 1 tablet (4 mg total) by mouth every 8 (eight) hours as needed for nausea or vomiting. Patient not taking: Reported on 08/02/2018 03/01/18   Rita Ohara, MD    Family History Family History  Problem Relation Age of Onset   Diabetes Sister    Diabetes Brother    Heart disease Brother    Hyperlipidemia Brother    Hypertension Brother    Stroke Brother    Diabetes Sister    Cancer Paternal Uncle    Heart disease Paternal Uncle     Social History Social History   Tobacco Use   Smoking status: Never Smoker   Smokeless tobacco: Never Used  Substance Use Topics   Alcohol use: Yes    Alcohol/week: 2.0 - 3.0 standard drinks    Types: 2 - 3 Glasses of wine per week    Comment: 1 glass of wine 1-2 times per month.   Drug use: No     Allergies   Penicillins   Review of Systems Review of Systems  Constitutional: Positive for fatigue.  All other systems reviewed and are negative.    Physical Exam Updated Vital Signs BP (!) 98/47    Pulse 86    Temp 98 F (36.7 C) (Oral)    Resp (!) 24    SpO2 94%   Physical Exam Vitals signs and nursing note reviewed.  Constitutional:      Appearance: Normal appearance.  HENT:     Head: Normocephalic and atraumatic.     Right Ear: External ear normal.     Left Ear: External ear normal.      Nose: Nose normal.     Mouth/Throat:     Mouth: Mucous membranes are moist.     Pharynx: Oropharynx is clear.  Eyes:     Extraocular Movements: Extraocular movements intact.     Conjunctiva/sclera: Conjunctivae normal.     Pupils: Pupils are equal, round, and reactive to light.  Neck:     Musculoskeletal: Normal range of motion  and neck supple.  Cardiovascular:     Rate and Rhythm: Normal rate and regular rhythm.     Pulses: Normal pulses.     Heart sounds: Normal heart sounds.  Pulmonary:     Effort: Pulmonary effort is normal.     Breath sounds: Normal breath sounds.  Abdominal:     General: Abdomen is flat. Bowel sounds are normal.     Palpations: Abdomen is soft.  Musculoskeletal: Normal range of motion.  Skin:    General: Skin is warm.     Capillary Refill: Capillary refill takes less than 2 seconds.  Neurological:     General: No focal deficit present.     Mental Status: He is alert and oriented to person, place, and time.  Psychiatric:        Mood and Affect: Mood normal.        Behavior: Behavior normal.        Thought Content: Thought content normal.        Judgment: Judgment normal.      ED Treatments / Results  Labs (all labs ordered are listed, but only abnormal results are displayed) Labs Reviewed  CBC WITH DIFFERENTIAL/PLATELET - Abnormal; Notable for the following components:      Result Value   WBC 12.2 (*)    RBC 3.49 (*)    Hemoglobin 11.3 (*)    HCT 34.4 (*)    Neutro Abs 10.8 (*)    Lymphs Abs 0.5 (*)    Abs Immature Granulocytes 0.09 (*)    All other components within normal limits  COMPREHENSIVE METABOLIC PANEL - Abnormal; Notable for the following components:   Potassium 3.4 (*)    CO2 20 (*)    Glucose, Bld 191 (*)    BUN 39 (*)    Creatinine, Ser 1.30 (*)    Calcium 8.3 (*)    Total Protein 6.2 (*)    Albumin 3.2 (*)    AST 87 (*)    ALT 137 (*)    Alkaline Phosphatase 622 (*)    Total Bilirubin 2.4 (*)    GFR calc non Af Amer 50  (*)    GFR calc Af Amer 58 (*)    All other components within normal limits  LACTIC ACID, PLASMA - Abnormal; Notable for the following components:   Lactic Acid, Venous 2.2 (*)    All other components within normal limits  CBG MONITORING, ED - Abnormal; Notable for the following components:   Glucose-Capillary 161 (*)    All other components within normal limits  SARS CORONAVIRUS 2 (TAT 6-24 HRS)  URINALYSIS, ROUTINE W REFLEX MICROSCOPIC  TSH  LIPASE, BLOOD  TROPONIN I (HIGH SENSITIVITY)    EKG None  Radiology Dg Chest 2 View  Result Date: 01/24/2019 CLINICAL DATA:  85 year old male with weakness for several days. EXAM: CHEST - 2 VIEW COMPARISON:  Chest radiographs 01/23/2019 and earlier. FINDINGS: Semi upright AP and lateral views of the chest. Lung volumes are stable and within normal limits. Cardiac size at the upper limits of normal. Other mediastinal contours are within normal limits. No pneumothorax, pulmonary edema, pleural effusion or confluent pulmonary opacity. Right axillary surgical clips. No acute osseous abnormality identified. Negative visible bowel gas pattern. IMPRESSION: No acute cardiopulmonary abnormality. Electronically Signed   By: Genevie Ann M.D.   On: 01/24/2019 21:23   Ct Head Wo Contrast  Result Date: 01/24/2019 CLINICAL DATA:  85 year old male with altered mental status. EXAM: CT HEAD WITHOUT CONTRAST  TECHNIQUE: Contiguous axial images were obtained from the base of the skull through the vertex without intravenous contrast. COMPARISON:  None. FINDINGS: Brain: Mild age-related atrophy and chronic microvascular ischemic changes. There is no acute intracranial hemorrhage. No mass effect or midline shift. No extra-axial fluid collection. Vascular: No hyperdense vessel or unexpected calcification. Skull: Normal. Negative for fracture or focal lesion. Sinuses/Orbits: Mild mucoperiosteal thickening paranasal sinuses. There is partial opacification of the left sphenoid  sinus. No air-fluid level. The mastoid air cells are clear. Other: None IMPRESSION: 1. No acute intracranial hemorrhage. 2. Mild age-related atrophy and chronic microvascular ischemic changes. Electronically Signed   By: Anner Crete M.D.   On: 01/24/2019 21:27    Procedures Procedures (including critical care time)  Medications Ordered in ED Medications  sodium chloride (PF) 0.9 % injection (has no administration in time range)  sodium chloride 0.9 % bolus 1,000 mL (has no administration in time range)  sodium chloride 0.9 % bolus 1,000 mL (0 mLs Intravenous Stopped 01/24/19 2250)  iohexol (OMNIPAQUE) 300 MG/ML solution 100 mL (100 mLs Intravenous Contrast Given 01/24/19 2325)     Initial Impression / Assessment and Plan / ED Course  I have reviewed the triage vital signs and the nursing notes.  Pertinent labs & imaging results that were available during my care of the patient were reviewed by me and considered in my medical decision making (see chart for details).     Pt looks like he is new onset afib, but rate is controlled.  The pt's lfts are elevated, so I added on a CT abd/pelvis.  Pt's bp continues to be low and lactic is elevated, so I added on another L.  Pt signed out to Dr. Betsey Holiday at shift change.  Final Clinical Impressions(s) / ED Diagnoses   Final diagnoses:  Weakness  Dehydration  New onset a-fib [facility])  Elevated LFTs    ED Discharge Orders    None       Isla Pence, MD 01/24/19 2334

## 2019-01-24 NOTE — ED Notes (Signed)
Patient transported to CT 

## 2019-01-24 NOTE — Telephone Encounter (Signed)
Dr. Sherrlyn Hock reports that she saw Andre Walker yesterday at the [facility] with complaints of left sided rib pain and chills.  They did XR which was normal.  Urine was +for nitrate and 15K WBC.  Culture is pending.  He was treated with Rocephin and Keflex.  She called him today to follow-up.  He is no longer having chills, still has pain, which is relieved by Tylenol.  Advised her of his history, with prior illness and hospitalization in 01/2017, under the care of the urologist (for BPH, h/o infections).  He is scheduled for f/u with me in 2 days . She plans to call urologist to discuss.  She appreciated the additional history.

## 2019-01-25 ENCOUNTER — Encounter (HOSPITAL_COMMUNITY): Payer: Self-pay | Admitting: General Surgery

## 2019-01-25 ENCOUNTER — Other Ambulatory Visit: Payer: Self-pay

## 2019-01-25 ENCOUNTER — Inpatient Hospital Stay (HOSPITAL_COMMUNITY): Payer: Medicare Other

## 2019-01-25 DIAGNOSIS — N179 Acute kidney failure, unspecified: Secondary | ICD-10-CM

## 2019-01-25 DIAGNOSIS — I10 Essential (primary) hypertension: Secondary | ICD-10-CM | POA: Diagnosis present

## 2019-01-25 DIAGNOSIS — Z85820 Personal history of malignant melanoma of skin: Secondary | ICD-10-CM | POA: Diagnosis not present

## 2019-01-25 DIAGNOSIS — Z96653 Presence of artificial knee joint, bilateral: Secondary | ICD-10-CM | POA: Diagnosis present

## 2019-01-25 DIAGNOSIS — E1151 Type 2 diabetes mellitus with diabetic peripheral angiopathy without gangrene: Secondary | ICD-10-CM | POA: Diagnosis present

## 2019-01-25 DIAGNOSIS — Z7984 Long term (current) use of oral hypoglycemic drugs: Secondary | ICD-10-CM | POA: Diagnosis not present

## 2019-01-25 DIAGNOSIS — E1129 Type 2 diabetes mellitus with other diabetic kidney complication: Secondary | ICD-10-CM

## 2019-01-25 DIAGNOSIS — Z7982 Long term (current) use of aspirin: Secondary | ICD-10-CM | POA: Diagnosis not present

## 2019-01-25 DIAGNOSIS — R7989 Other specified abnormal findings of blood chemistry: Secondary | ICD-10-CM

## 2019-01-25 DIAGNOSIS — I361 Nonrheumatic tricuspid (valve) insufficiency: Secondary | ICD-10-CM | POA: Diagnosis not present

## 2019-01-25 DIAGNOSIS — I517 Cardiomegaly: Secondary | ICD-10-CM

## 2019-01-25 DIAGNOSIS — I482 Chronic atrial fibrillation, unspecified: Secondary | ICD-10-CM | POA: Diagnosis present

## 2019-01-25 DIAGNOSIS — I4891 Unspecified atrial fibrillation: Secondary | ICD-10-CM | POA: Diagnosis present

## 2019-01-25 DIAGNOSIS — E860 Dehydration: Secondary | ICD-10-CM | POA: Diagnosis present

## 2019-01-25 DIAGNOSIS — K851 Biliary acute pancreatitis without necrosis or infection: Secondary | ICD-10-CM | POA: Diagnosis present

## 2019-01-25 DIAGNOSIS — Z833 Family history of diabetes mellitus: Secondary | ICD-10-CM | POA: Diagnosis not present

## 2019-01-25 DIAGNOSIS — J189 Pneumonia, unspecified organism: Secondary | ICD-10-CM

## 2019-01-25 DIAGNOSIS — I4892 Unspecified atrial flutter: Secondary | ICD-10-CM | POA: Diagnosis present

## 2019-01-25 DIAGNOSIS — E7800 Pure hypercholesterolemia, unspecified: Secondary | ICD-10-CM

## 2019-01-25 DIAGNOSIS — K8051 Calculus of bile duct without cholangitis or cholecystitis with obstruction: Secondary | ICD-10-CM | POA: Diagnosis present

## 2019-01-25 DIAGNOSIS — E785 Hyperlipidemia, unspecified: Secondary | ICD-10-CM | POA: Diagnosis present

## 2019-01-25 DIAGNOSIS — H9190 Unspecified hearing loss, unspecified ear: Secondary | ICD-10-CM | POA: Diagnosis present

## 2019-01-25 DIAGNOSIS — K8012 Calculus of gallbladder with acute and chronic cholecystitis without obstruction: Secondary | ICD-10-CM | POA: Diagnosis present

## 2019-01-25 DIAGNOSIS — E02 Subclinical iodine-deficiency hypothyroidism: Secondary | ICD-10-CM | POA: Diagnosis present

## 2019-01-25 DIAGNOSIS — D692 Other nonthrombocytopenic purpura: Secondary | ICD-10-CM | POA: Diagnosis present

## 2019-01-25 DIAGNOSIS — I959 Hypotension, unspecified: Secondary | ICD-10-CM | POA: Diagnosis present

## 2019-01-25 DIAGNOSIS — K810 Acute cholecystitis: Secondary | ICD-10-CM | POA: Diagnosis not present

## 2019-01-25 DIAGNOSIS — I351 Nonrheumatic aortic (valve) insufficiency: Secondary | ICD-10-CM

## 2019-01-25 DIAGNOSIS — E11649 Type 2 diabetes mellitus with hypoglycemia without coma: Secondary | ICD-10-CM | POA: Diagnosis not present

## 2019-01-25 DIAGNOSIS — R809 Proteinuria, unspecified: Secondary | ICD-10-CM

## 2019-01-25 DIAGNOSIS — N400 Enlarged prostate without lower urinary tract symptoms: Secondary | ICD-10-CM | POA: Diagnosis present

## 2019-01-25 DIAGNOSIS — I248 Other forms of acute ischemic heart disease: Secondary | ICD-10-CM | POA: Diagnosis present

## 2019-01-25 DIAGNOSIS — Z20828 Contact with and (suspected) exposure to other viral communicable diseases: Secondary | ICD-10-CM | POA: Diagnosis present

## 2019-01-25 HISTORY — DX: Cardiomegaly: I51.7

## 2019-01-25 HISTORY — DX: Pneumonia, unspecified organism: J18.9

## 2019-01-25 LAB — URINALYSIS, ROUTINE W REFLEX MICROSCOPIC
Bacteria, UA: NONE SEEN
Bilirubin Urine: NEGATIVE
Glucose, UA: 50 mg/dL — AB
Hgb urine dipstick: NEGATIVE
Ketones, ur: NEGATIVE mg/dL
Leukocytes,Ua: NEGATIVE
Nitrite: NEGATIVE
Protein, ur: 30 mg/dL — AB
Specific Gravity, Urine: 1.043 — ABNORMAL HIGH (ref 1.005–1.030)
pH: 5 (ref 5.0–8.0)

## 2019-01-25 LAB — COMPREHENSIVE METABOLIC PANEL
ALT: 109 U/L — ABNORMAL HIGH (ref 0–44)
AST: 50 U/L — ABNORMAL HIGH (ref 15–41)
Albumin: 2.7 g/dL — ABNORMAL LOW (ref 3.5–5.0)
Alkaline Phosphatase: 459 U/L — ABNORMAL HIGH (ref 38–126)
Anion gap: 9 (ref 5–15)
BUN: 36 mg/dL — ABNORMAL HIGH (ref 8–23)
CO2: 22 mmol/L (ref 22–32)
Calcium: 8 mg/dL — ABNORMAL LOW (ref 8.9–10.3)
Chloride: 107 mmol/L (ref 98–111)
Creatinine, Ser: 1.14 mg/dL (ref 0.61–1.24)
GFR calc Af Amer: >60 mL/min (ref >60)
GFR calc non Af Amer: 59 mL/min — ABNORMAL LOW (ref >60)
Glucose, Bld: 152 mg/dL — ABNORMAL HIGH (ref 70–99)
Potassium: 3.7 mmol/L (ref 3.5–5.1)
Sodium: 138 mmol/L (ref 135–145)
Total Bilirubin: 1.3 mg/dL — ABNORMAL HIGH (ref 0.3–1.2)
Total Protein: 5.6 g/dL — ABNORMAL LOW (ref 6.5–8.1)

## 2019-01-25 LAB — LIPASE, BLOOD
Lipase: 892 U/L — ABNORMAL HIGH (ref 11–51)
Lipase: 208 U/L — ABNORMAL HIGH (ref 11–51)

## 2019-01-25 LAB — CBC
HCT: 30.5 % — ABNORMAL LOW (ref 39.0–52.0)
Hemoglobin: 9.8 g/dL — ABNORMAL LOW (ref 13.0–17.0)
MCH: 32.2 pg (ref 26.0–34.0)
MCHC: 32.1 g/dL (ref 30.0–36.0)
MCV: 100.3 fL — ABNORMAL HIGH (ref 80.0–100.0)
Platelets: 223 10*3/uL (ref 150–400)
RBC: 3.04 MIL/uL — ABNORMAL LOW (ref 4.22–5.81)
RDW: 13.2 % (ref 11.5–15.5)
WBC: 11.6 10*3/uL — ABNORMAL HIGH (ref 4.0–10.5)
nRBC: 0 % (ref 0.0–0.2)

## 2019-01-25 LAB — ECHOCARDIOGRAM COMPLETE
Height: 70 in
Weight: 3030 oz

## 2019-01-25 LAB — TSH: TSH: 2.541 u[IU]/mL (ref 0.350–4.500)

## 2019-01-25 LAB — GLUCOSE, CAPILLARY
Glucose-Capillary: 108 mg/dL — ABNORMAL HIGH (ref 70–99)
Glucose-Capillary: 103 mg/dL — ABNORMAL HIGH (ref 70–99)
Glucose-Capillary: 95 mg/dL (ref 70–99)
Glucose-Capillary: 88 mg/dL (ref 70–99)

## 2019-01-25 LAB — TROPONIN I (HIGH SENSITIVITY)
Troponin I (High Sensitivity): 123 ng/L (ref <18)
Troponin I (High Sensitivity): 126 ng/L (ref <18)
Troponin I (High Sensitivity): 77 ng/L — ABNORMAL HIGH (ref <18)
Troponin I (High Sensitivity): 71 ng/L — ABNORMAL HIGH (ref <18)

## 2019-01-25 LAB — LACTIC ACID, PLASMA: Lactic Acid, Venous: 1.7 mmol/L (ref 0.5–1.9)

## 2019-01-25 LAB — CBG MONITORING, ED: Glucose-Capillary: 144 mg/dL — ABNORMAL HIGH (ref 70–99)

## 2019-01-25 LAB — SARS CORONAVIRUS 2 (TAT 6-24 HRS): SARS Coronavirus 2: NEGATIVE

## 2019-01-25 LAB — HEPARIN LEVEL (UNFRACTIONATED): Heparin Unfractionated: <0.1 IU/mL — ABNORMAL LOW (ref 0.30–0.70)

## 2019-01-25 MED ORDER — ONDANSETRON HCL 4 MG/2ML IJ SOLN: 4.0000 mg | Freq: Four times a day (QID) | INTRAMUSCULAR | Status: DC | PRN

## 2019-01-25 MED ORDER — INSULIN ASPART 100 UNIT/ML ~~LOC~~ SOLN
0.0000 [IU] | SUBCUTANEOUS | Status: DC
  Administered 2019-01-25 – 2019-01-27 (×4): 1 [IU] via SUBCUTANEOUS
  Administered 2019-01-28: 2 [IU] via SUBCUTANEOUS
  Administered 2019-01-28: 1 [IU] via SUBCUTANEOUS
  Administered 2019-01-28: 2 [IU] via SUBCUTANEOUS
  Administered 2019-01-28: 1 [IU] via SUBCUTANEOUS
  Administered 2019-01-28: 2 [IU] via SUBCUTANEOUS
  Administered 2019-01-29: 1 [IU] via SUBCUTANEOUS
  Administered 2019-01-29: 2 [IU] via SUBCUTANEOUS
  Administered 2019-01-29: 1 [IU] via SUBCUTANEOUS
  Filled 2019-01-25: qty 0.09

## 2019-01-25 MED ORDER — ACETAMINOPHEN 325 MG PO TABS: 650.0000 mg | ORAL_TABLET | Freq: Four times a day (QID) | ORAL | Status: DC | PRN

## 2019-01-25 MED ORDER — TERAZOSIN HCL 5 MG PO CAPS
5.0000 mg | ORAL_CAPSULE | Freq: Every day | ORAL | Status: DC
  Administered 2019-01-25 – 2019-01-28 (×4): 5 mg via ORAL
  Filled 2019-01-25 (×6): qty 1

## 2019-01-25 MED ORDER — SIMVASTATIN 20 MG PO TABS
20.0000 mg | ORAL_TABLET | Freq: Every day | ORAL | Status: DC
  Administered 2019-01-25 – 2019-01-28 (×4): 20 mg via ORAL
  Filled 2019-01-25 (×4): qty 1

## 2019-01-25 MED ORDER — ONDANSETRON HCL 4 MG PO TABS: 4.0000 mg | ORAL_TABLET | Freq: Four times a day (QID) | ORAL | Status: DC | PRN

## 2019-01-25 MED ORDER — HEPARIN BOLUS VIA INFUSION
2000.0000 [IU] | Freq: Once | INTRAVENOUS | Status: AC
  Administered 2019-01-25: 2000 [IU] via INTRAVENOUS
  Filled 2019-01-25: qty 2000

## 2019-01-25 MED ORDER — METRONIDAZOLE IN NACL 5-0.79 MG/ML-% IV SOLN
500.0000 mg | Freq: Once | INTRAVENOUS | Status: AC
  Administered 2019-01-25: 500 mg via INTRAVENOUS
  Filled 2019-01-25: qty 100

## 2019-01-25 MED ORDER — SODIUM CHLORIDE 0.9 % IV SOLN
2.0000 g | Freq: Once | INTRAVENOUS | Status: AC
  Administered 2019-01-25: 2 g via INTRAVENOUS
  Filled 2019-01-25: qty 2

## 2019-01-25 MED ORDER — HEPARIN (PORCINE) 25000 UT/250ML-% IV SOLN
1050.0000 [IU]/h | INTRAVENOUS | Status: DC
  Administered 2019-01-25: 1050 [IU]/h via INTRAVENOUS
  Filled 2019-01-25: qty 250

## 2019-01-25 MED ORDER — HEPARIN (PORCINE) 25000 UT/250ML-% IV SOLN
1400.0000 [IU]/h | INTRAVENOUS | Status: DC
  Administered 2019-01-25 (×2): 1250 [IU]/h via INTRAVENOUS
  Filled 2019-01-25: qty 250

## 2019-01-25 MED ORDER — SODIUM CHLORIDE 0.9 % IV SOLN
2.0000 g | INTRAVENOUS | Status: DC
  Administered 2019-01-25 – 2019-01-29 (×4): 2 g via INTRAVENOUS
  Filled 2019-01-25 (×2): qty 2
  Filled 2019-01-25: qty 20
  Filled 2019-01-25: qty 2
  Filled 2019-01-25: qty 20

## 2019-01-25 MED ORDER — ACETAMINOPHEN 650 MG RE SUPP: 650.0000 mg | Freq: Four times a day (QID) | RECTAL | Status: DC | PRN

## 2019-01-25 MED ORDER — SODIUM CHLORIDE 0.9 % IV SOLN
INTRAVENOUS | Status: DC
  Administered 2019-01-25 – 2019-01-29 (×8): via INTRAVENOUS

## 2019-01-25 MED ORDER — METRONIDAZOLE IN NACL 5-0.79 MG/ML-% IV SOLN
500.0000 mg | Freq: Three times a day (TID) | INTRAVENOUS | Status: DC
  Administered 2019-01-25 – 2019-01-29 (×13): 500 mg via INTRAVENOUS
  Filled 2019-01-25 (×12): qty 100

## 2019-01-25 NOTE — Plan of Care (Signed)
  Problem: Education: Goal: Knowledge of General Education information will improve Description: Including pain rating scale, medication(s)/side effects and non-pharmacologic comfort measures Outcome: Progressing   Problem: Clinical Measurements: Goal: Respiratory complications will improve Outcome: Progressing Goal: Cardiovascular complication will be avoided Outcome: Progressing   Problem: Activity: Goal: Risk for activity intolerance will decrease Outcome: Progressing   Problem: Elimination: Goal: Will not experience complications related to urinary retention Outcome: Progressing   Problem: Pain Managment: Goal: General experience of comfort will improve Outcome: Progressing   Problem: Safety: Goal: Ability to remain free from injury will improve Outcome: Progressing   Problem: Skin Integrity: Goal: Risk for impaired skin integrity will decrease Outcome: Progressing

## 2019-01-25 NOTE — Progress Notes (Signed)
This is a pleasant 85 year old gentleman who was admitted early this morning with a complaint of generalized weakness and was diagnosed with acute cholecystitis.  He was also found to have new onset atrial fibrillation which was rate controlled.  Patient seen and examined in the room.  He has no complaints.  Feels much better.  Denies any abdominal pain, nausea or vomiting and weakness is improving as well.  On examination, heart rate is irregularly irregular around 80s.  Lungs clear to auscultation.  Abdomen soft, nondistended but tender at epigastric as well as right upper quadrant with positive Murphy sign.  No edema.  Per telemetry, he remains in atrial fibrillation with rates around 80s.  He remains on heparin GTT per cardiology recommendation.  Troponin I 23> 126> 77.  Case was discussed by ED physician with cardiology who was not impressed with troponin and with patient having no ACS symptoms, this is likely demand ischemia and nonspecific.  Echo pending.  To be seen by general surgery.  Continue antibiotics.

## 2019-01-25 NOTE — Progress Notes (Signed)
  Echocardiogram 2D Echocardiogram has been performed.  Andre Walker 01/25/2019, 4:37 PM

## 2019-01-25 NOTE — ED Notes (Signed)
Pt's wife at bedside.

## 2019-01-25 NOTE — Consult Note (Addendum)
Andre Walker 08-18-1932  [id number].    Requesting MD: Dr. Darliss Cheney Chief Complaint/Reason for Consult: acute cholecystitis with gallstone pancreatitis  HPI:  This is an 85 yo white male with a history of melanoma and DM, but generally otherwise fairly healthy who started have N/V as well as malaise last Thursday on Thanksgiving, prior to any meals.  He went to urgent care where he was told he had a UTI on Saturday.  He denies every really have any abdominal pain.  He tried to drink water, but at one point threw this up.  He ate very little since last Thursday and has had continued nausea and malaise.  He returned to the urgent clinic where he was then referred to the ED for further evaluation.  Upon arrival he was found to be in new onset a fib with some hypotension.  He underwent a CT scan that revealed gallbladder wall thickening with gallstones and pericholecystic fluid c/w cholecystitis along with a lipase of over 800 and elevated LFTs.  His CT scan also revealed bilateral patchy/ground glass opacities at both lung bases which could be atelectasis vs early infectious etiology.  He was admitted and started on a heparin gtt for the a fib.he was started on abx therapy as well.  We have been asked to see him for further evaluation and recommendations.  ROS: ROS: Please see HPI, otherwise all other systems have been reviewed and are negative.  Family History  Problem Relation Age of Onset   Diabetes Sister    Diabetes Brother    Heart disease Brother    Hyperlipidemia Brother    Hypertension Brother    Stroke Brother    Diabetes Sister    Cancer Paternal Uncle    Heart disease Paternal Uncle     Past Medical History:  Diagnosis Date   Actinic keratosis    Dr. Allyson Sabal   BPH (benign prostatic hyperplasia)    controlled on Hytrin   Colon polyps    Dr. Watt Climes   Diabetes mellitus 2004   ED (erectile dysfunction)    Hearing loss    high frequency   Hiatal  hernia    small, noted on EGD 10/2010   Melanoma ([employer])    right arm   Microalbuminuria    on ACEI for microalbuminuria (does NOT have HTN)   Pure hypercholesterolemia 2006   Traumatic partial tear of biceps tendon 01/2009   "popeye" injury on right   Wears glasses    Wears partial dentures    lower    Past Surgical History:  Procedure Laterality Date   APPENDECTOMY  1955   CATARACT EXTRACTION Bilateral R 05/07/15, L 05/21/15   Dr. Katy Fitch   COLONOSCOPY  11/09, 10/29/10   Dr. Watt Climes   ESOPHAGOGASTRODUODENOSCOPY  10/29/10   Dr. Watt Climes   INGUINAL HERNIA REPAIR Right 1989   MELANOMA EXCISION  10/08/10   R upper arm, Dr. Hassell Done   MELANOMA EXCISION  03/16/2012   Procedure: MELANOMA EXCISION;  Surgeon: Pedro Earls, MD;  Location: [facility];  Service: General;  Laterality: Right;  excision of melanoma of right arm   TOTAL KNEE ARTHROPLASTY Right 2012   TOTAL KNEE ARTHROPLASTY Left 2005    Social History:  reports that he has never smoked. He has never used smokeless tobacco. He reports current alcohol use of about 2.0 - 3.0 standard drinks of alcohol per week. He reports that he does not use drugs.  Allergies:  Allergies  Allergen Reactions   Penicillins Rash    Has patient had a PCN reaction causing immediate rash, facial/tongue/throat swelling, SOB or lightheadedness with hypotension: No Has patient had a PCN reaction causing severe rash involving mucus membranes or skin necrosis: No Has patient had a PCN reaction that required hospitalization: No Has patient had a PCN reaction occurring within the last 10 years: No If all of the above answers are "NO", then may proceed with Cephalosporin use.     Medications Prior to Admission  Medication Sig Dispense Refill   acetaminophen (TYLENOL) 325 MG tablet Take 650 mg by mouth every 6 (six) hours as needed for mild pain, moderate pain or headache.      aspirin 81 MG tablet Take 81 mg by mouth daily.         Calcium Carbonate-Vitamin D (CALCIUM 600+D HIGH POTENCY) 600-400 MG-UNIT per tablet Take 1 tablet by mouth 2 (two) times daily.      cephALEXin (KEFLEX) 500 MG capsule Take 500 mg by mouth 4 (four) times daily.     Coenzyme Q10 (COQ10) 100 MG CAPS Take 1 capsule by mouth daily.       glucosamine-chondroitin 500-400 MG tablet Take 2 tablets by mouth daily.      glucose blood test strip 1 each by Other route daily. Use as instructed 100 each 2   lisinopril (ZESTRIL) 5 MG tablet Take 1 tablet (5 mg total) by mouth daily. 90 tablet 1   loratadine (CLARITIN) 10 MG tablet Take 10 mg by mouth daily.     metFORMIN (GLUCOPHAGE-XR) 500 MG 24 hr tablet TAKE 2 TABLETS BY MOUTH ONCE DAILY WITH BREAKFAST (Patient taking differently: Take 1,000 mg by mouth daily with breakfast. ) 180 tablet 0   Multiple Vitamins-Minerals (MULTIVITAMIN WITH MINERALS) tablet Take 1 tablet by mouth daily.       Omega-3 Fatty Acids (FISH OIL PO) Take 1,600 mg by mouth daily.     ONETOUCH DELICA LANCETS 99991111 MISC 1 each by Does not apply route daily. 100 each 2   simvastatin (ZOCOR) 20 MG tablet Take 1 tablet (20 mg total) by mouth at bedtime. 90 tablet 1   terazosin (HYTRIN) 5 MG capsule Take 1 capsule by mouth at bedtime 90 capsule 0     Physical Exam: Blood pressure 112/64, pulse 78, temperature 98 F (36.7 C), temperature source Oral, resp. rate (!) 22, height 5\' 10"  (1.778 m), weight 85.9 kg, SpO2 97 %. General: pleasant, WD, WN elderly white male who is laying in bed in NAD HEENT: head is normocephalic, atraumatic.  Sclera are noninjected.  PERRL.  Ears and nose without any masses or lesions.  Mouth is pink and moist Heart: irregular but seems rate controlled and not tachy.  Normal s1,s2. No obvious murmurs, gallops, or rubs noted.  Palpable radial and pedal pulses bilaterally Lungs: CTAB, no wheezes, rhonchi, or rales noted.  Respiratory effort nonlabored Abd: soft, mildly tender in RUQ and epigastrium, ND,  +BS, no masses or organomegaly MS: all 4 extremities are symmetrical with no cyanosis, clubbing, or edema. Skin: warm and dry with no masses, lesions, or rashes Psych: A&Ox3 with an appropriate affect.   Results for orders placed or performed during the hospital encounter of 01/24/19 (from the past 48 hour(s))  CBG monitoring, ED     Status: Abnormal   Collection Time: 01/24/19  9:32 PM  Result Value Ref Range   Glucose-Capillary 161 (H) 70 - 99 mg/dL  CBC with Differential  Status: Abnormal   Collection Time: 01/24/19  9:49 PM  Result Value Ref Range   WBC 12.2 (H) 4.0 - 10.5 K/uL   RBC 3.49 (L) 4.22 - 5.81 MIL/uL   Hemoglobin 11.3 (L) 13.0 - 17.0 g/dL   HCT 34.4 (L) 39.0 - 52.0 %   MCV 98.6 80.0 - 100.0 fL   MCH 32.4 26.0 - 34.0 pg   MCHC 32.8 30.0 - 36.0 g/dL   RDW 13.0 11.5 - 15.5 %   Platelets 252 150 - 400 K/uL   nRBC 0.0 0.0 - 0.2 %   Neutrophils Relative % 88 %   Neutro Abs 10.8 (H) 1.7 - 7.7 K/uL   Lymphocytes Relative 4 %   Lymphs Abs 0.5 (L) 0.7 - 4.0 K/uL   Monocytes Relative 7 %   Monocytes Absolute 0.9 0.1 - 1.0 K/uL   Eosinophils Relative 0 %   Eosinophils Absolute 0.0 0.0 - 0.5 K/uL   Basophils Relative 0 %   Basophils Absolute 0.0 0.0 - 0.1 K/uL   Immature Granulocytes 1 %   Abs Immature Granulocytes 0.09 (H) 0.00 - 0.07 K/uL    Comment: Performed at [facility], [place] [address], [place], [place] [address]  Comprehensive metabolic panel     Status: Abnormal   Collection Time: 01/24/19  9:49 PM  Result Value Ref Range   Sodium 135 135 - 145 mmol/L   Potassium 3.4 (L) 3.5 - 5.1 mmol/L   Chloride 104 98 - 111 mmol/L   CO2 20 (L) 22 - 32 mmol/L   Glucose, Bld 191 (H) 70 - 99 mg/dL   BUN 39 (H) 8 - 23 mg/dL   Creatinine, Ser 1.30 (H) 0.61 - 1.24 mg/dL   Calcium 8.3 (L) 8.9 - 10.3 mg/dL   Total Protein 6.2 (L) 6.5 - 8.1 g/dL   Albumin 3.2 (L) 3.5 - 5.0 g/dL   AST 87 (H) 15 - 41 U/L   ALT 137 (H) 0 - 44 U/L   Alkaline Phosphatase 622  (H) 38 - 126 U/L   Total Bilirubin 2.4 (H) 0.3 - 1.2 mg/dL   GFR calc non Af Amer 50 (L) >60 mL/min   GFR calc Af Amer 58 (L) >60 mL/min   Anion gap 11 5 - 15    Comment: Performed at [facility], [place] [address] [place], [place] [address]  Lactic acid, plasma     Status: Abnormal   Collection Time: 01/24/19  9:49 PM  Result Value Ref Range   Lactic Acid, Venous 2.2 (HH) 0.5 - 1.9 mmol/L    Comment: CRITICAL RESULT CALLED TO, READ BACK BY AND VERIFIED WITH: LOVINGS,A @ 2328 ON [id number] BY POTEAT,S Performed at [facility] [address] [place], [place] [address]   SARS CORONAVIRUS 2 (TAT 6-24 HRS) Nasopharyngeal Nasopharyngeal Swab     Status: None   Collection Time: 01/24/19  9:49 PM   Specimen: Nasopharyngeal Swab  Result Value Ref Range   SARS Coronavirus 2 NEGATIVE NEGATIVE    Comment: (NOTE) SARS-CoV-2 target nucleic acids are NOT DETECTED. The SARS-CoV-2 RNA is generally detectable in upper and lower respiratory specimens during the acute phase of infection. Negative results do not preclude SARS-CoV-2 infection, do not rule out co-infections with other pathogens, and should not be used as the sole basis for treatment or other patient management decisions. Negative results must be combined with clinical observations, patient history, and epidemiological information. The expected result is Negative. Fact  Sheet for Patients: SugarRoll.be Fact Sheet for Healthcare Providers: https://www.woods-mathews.com/ This test is not yet approved or cleared by the [place] FDA and  has been authorized for detection and/or diagnosis of SARS-CoV-2 by FDA under an Emergency Use Authorization (EUA). This EUA will remain  in effect (meaning this test can be used) for the duration of the COVID-19 declaration under Section 56 4(b)(1) of the Act, 21 U.S.C. section 360bbb-3(b)(1), unless the authorization is  terminated or revoked sooner. Performed at [facility], [place] [address] [place], [place] [address]   TSH     Status: None   Collection Time: 01/24/19 10:36 PM  Result Value Ref Range   TSH 2.541 0.350 - 4.500 uIU/mL    Comment: Performed by a 3rd Generation assay with a functional sensitivity of <=0.01 uIU/mL. Performed at [facility], [place] [address] [place], [place] [address]   Lipase, blood     Status: Abnormal   Collection Time: 01/24/19 11:54 PM  Result Value Ref Range   Lipase 892 (H) 11 - 51 U/L    Comment: RESULTS CONFIRMED BY MANUAL DILUTION Performed at [facility], [place] [address] [place], [place] [address]   Troponin I (High Sensitivity)     Status: Abnormal   Collection Time: 01/24/19 11:54 PM  Result Value Ref Range   Troponin I (High Sensitivity) 123 (HH) <18 ng/L    Comment: CRITICAL RESULT CALLED TO, READ BACK BY AND VERIFIED WITH: ILENE HODGES @ 0121 ON 01/25/2019 C VARNER (NOTE) Elevated high sensitivity troponin I (hsTnI) values and significant  changes across serial measurements may suggest ACS but many other  chronic and acute conditions are known to elevate hsTnI results.  Refer to the Links section for chest pain algorithms and additional  guidance. Performed at [facility], [place] [address], [place], [place] [address]   Troponin I (High Sensitivity)     Status: Abnormal   Collection Time: 01/25/19  2:14 AM  Result Value Ref Range   Troponin I (High Sensitivity) 126 (HH) <18 ng/L    Comment: CRITICAL VALUE NOTED.  VALUE IS CONSISTENT WITH PREVIOUSLY REPORTED AND CALLED VALUE. (NOTE) Elevated high sensitivity troponin I (hsTnI) values and significant  changes across serial measurements may suggest ACS but many other  chronic and acute conditions are known to elevate hsTnI results.  Refer to the Links section for chest pain algorithms and additional  guidance. Performed at [facility], [place] [address] [place], [place] [address]   Culture, blood (routine x 2)     Status: None (Preliminary result)   Collection Time: 01/25/19  2:14 AM   Specimen: BLOOD RIGHT WRIST  Result Value Ref Range   Specimen Description      BLOOD RIGHT WRIST Performed at [place] [address] [place], [place] [address]    Special Requests      BOTTLES DRAWN AEROBIC AND ANAEROBIC Blood Culture results may not be optimal due to an excessive volume of blood received in culture bottles Performed at [place] [address] [place], [place] [address]    Culture PENDING    Report Status PENDING   Urinalysis, Routine w reflex microscopic     Status: Abnormal   Collection Time: 01/25/19  2:15 AM  Result Value Ref Range   Color, Urine AMBER (A) YELLOW    Comment: BIOCHEMICALS MAY BE AFFECTED BY COLOR   APPearance CLEAR CLEAR  Specific Gravity, Urine 1.043 (H) 1.005 - 1.030   pH 5.0 5.0 - 8.0   Glucose, UA 50 (A) NEGATIVE mg/dL   Hgb urine dipstick NEGATIVE NEGATIVE   Bilirubin Urine NEGATIVE NEGATIVE   Ketones, ur NEGATIVE NEGATIVE mg/dL   Protein, ur 30 (A) NEGATIVE mg/dL   Nitrite NEGATIVE NEGATIVE   Leukocytes,Ua NEGATIVE NEGATIVE   RBC / HPF 0-5 0 - 5 RBC/hpf   WBC, UA 0-5 0 - 5 WBC/hpf   Bacteria, UA NONE SEEN NONE SEEN   Squamous Epithelial / LPF 0-5 0 - 5    Comment: Performed at [facility], [place] [address], [place], [place] [address]  Lactic acid, plasma     Status: None   Collection Time: 01/25/19  2:15 AM  Result Value Ref Range   Lactic Acid, Venous 1.7 0.5 - 1.9 mmol/L    Comment: Performed at [facility], [place] [address] [place], [place] [address]  CBG monitoring, ED     Status: Abnormal   Collection Time: 01/25/19  4:27 AM  Result Value Ref Range   Glucose-Capillary 144 (H) 70 - 99 mg/dL  CBC     Status: Abnormal   Collection Time: 01/25/19  5:00 AM  Result Value Ref Range    WBC 11.6 (H) 4.0 - 10.5 K/uL   RBC 3.04 (L) 4.22 - 5.81 MIL/uL   Hemoglobin 9.8 (L) 13.0 - 17.0 g/dL   HCT 30.5 (L) 39.0 - 52.0 %   MCV 100.3 (H) 80.0 - 100.0 fL   MCH 32.2 26.0 - 34.0 pg   MCHC 32.1 30.0 - 36.0 g/dL   RDW 13.2 11.5 - 15.5 %   Platelets 223 150 - 400 K/uL   nRBC 0.0 0.0 - 0.2 %    Comment: Performed at [facility], [place] [address] [place], [place] [address]  Comprehensive metabolic panel     Status: Abnormal   Collection Time: 01/25/19  5:00 AM  Result Value Ref Range   Sodium 138 135 - 145 mmol/L   Potassium 3.7 3.5 - 5.1 mmol/L   Chloride 107 98 - 111 mmol/L   CO2 22 22 - 32 mmol/L   Glucose, Bld 152 (H) 70 - 99 mg/dL   BUN 36 (H) 8 - 23 mg/dL   Creatinine, Ser 1.14 0.61 - 1.24 mg/dL   Calcium 8.0 (L) 8.9 - 10.3 mg/dL   Total Protein 5.6 (L) 6.5 - 8.1 g/dL   Albumin 2.7 (L) 3.5 - 5.0 g/dL   AST 50 (H) 15 - 41 U/L   ALT 109 (H) 0 - 44 U/L   Alkaline Phosphatase 459 (H) 38 - 126 U/L   Total Bilirubin 1.3 (H) 0.3 - 1.2 mg/dL   GFR calc non Af Amer 59 (L) >60 mL/min   GFR calc Af Amer >60 >60 mL/min   Anion gap 9 5 - 15    Comment: Performed at [facility], [place] [address], [place], [place] [address]  Lipase, blood     Status: Abnormal   Collection Time: 01/25/19  5:00 AM  Result Value Ref Range   Lipase 208 (H) 11 - 51 U/L    Comment: Performed at [facility], [place] [address], [place], [place] [address]  Glucose, capillary     Status: Abnormal   Collection Time: 01/25/19  7:59 AM  Result Value Ref Range   Glucose-Capillary 108 (H) 70 - 99 mg/dL  Troponin I (High Sensitivity)  Status: Abnormal   Collection Time: 01/25/19  8:28 AM  Result Value Ref Range   Troponin I (High Sensitivity) 77 (H) <18 ng/L    Comment: (NOTE) Elevated high sensitivity troponin I (hsTnI) values and significant  changes across serial measurements may suggest ACS but many other  chronic and acute conditions are known to  elevate hsTnI results.  Refer to the Links section for chest pain algorithms and additional  guidance. Performed at [facility], [place] [address], [place], [place] [address]   Troponin I (High Sensitivity)     Status: Abnormal   Collection Time: 01/25/19 10:03 AM  Result Value Ref Range   Troponin I (High Sensitivity) 71 (H) <18 ng/L    Comment: (NOTE) Elevated high sensitivity troponin I (hsTnI) values and significant  changes across serial measurements may suggest ACS but many other  chronic and acute conditions are known to elevate hsTnI results.  Refer to the "Links" section for chest pain algorithms and additional  guidance. Performed at [facility], [place] [address] [place], [place] [address]    Dg Chest 2 View  Result Date: 01/24/2019 CLINICAL DATA:  85 year old male with weakness for several days. EXAM: CHEST - 2 VIEW COMPARISON:  Chest radiographs 01/23/2019 and earlier. FINDINGS: Semi upright AP and lateral views of the chest. Lung volumes are stable and within normal limits. Cardiac size at the upper limits of normal. Other mediastinal contours are within normal limits. No pneumothorax, pulmonary edema, pleural effusion or confluent pulmonary opacity. Right axillary surgical clips. No acute osseous abnormality identified. Negative visible bowel gas pattern. IMPRESSION: No acute cardiopulmonary abnormality. Electronically Signed   By: Genevie Ann M.D.   On: 01/24/2019 21:23   Ct Head Wo Contrast  Result Date: 01/24/2019 CLINICAL DATA:  85 year old male with altered mental status. EXAM: CT HEAD WITHOUT CONTRAST TECHNIQUE: Contiguous axial images were obtained from the base of the skull through the vertex without intravenous contrast. COMPARISON:  None. FINDINGS: Brain: Mild age-related atrophy and chronic microvascular ischemic changes. There is no acute intracranial hemorrhage. No mass effect or midline shift. No extra-axial fluid collection.  Vascular: No hyperdense vessel or unexpected calcification. Skull: Normal. Negative for fracture or focal lesion. Sinuses/Orbits: Mild mucoperiosteal thickening paranasal sinuses. There is partial opacification of the left sphenoid sinus. No air-fluid level. The mastoid air cells are clear. Other: None IMPRESSION: 1. No acute intracranial hemorrhage. 2. Mild age-related atrophy and chronic microvascular ischemic changes. Electronically Signed   By: Anner Crete M.D.   On: 01/24/2019 21:27   Ct Abdomen Pelvis W Contrast  Result Date: 01/24/2019 CLINICAL DATA:  Acute abdominal pain EXAM: CT ABDOMEN AND PELVIS WITH CONTRAST TECHNIQUE: Multidetector CT imaging of the abdomen and pelvis was performed using the standard protocol following bolus administration of intravenous contrast. CONTRAST:  164mL OMNIPAQUE IOHEXOL 300 MG/ML  SOLN COMPARISON:  January 24, 2017 FINDINGS: Lower chest: There is moderate cardiomegaly. Patchy ground-glass opacities seen at both lung bases, right greater than left with tree-in-bud opacity seen peripherally. No hiatal hernia. Hepatobiliary: The liver is normal in density without focal abnormality.The main portal vein is patent. There is mild hyperenhancement of the gallbladder wall with surrounding fat stranding changes and a pericholecystic fluid. There is layering calcified gallstones. No free air however is noted. Mild intrahepatic biliary ductal dilatation is noted. Pancreas: Unremarkable. No pancreatic ductal dilatation or surrounding inflammatory changes. Spleen: Normal in size without focal abnormality. Adrenals/Urinary Tract: Both adrenal glands appear normal. There is a  3 mm calculus seen in the lower pole the left kidney. No hydronephrosis is noted. No ureteral calculi seen. Bladder is unremarkable. Stomach/Bowel: The stomach, small bowel are normal in appearance. Scattered colonic diverticula are noted without diverticulitis. No inflammatory changes, wall thickening, or  obstructive findings.The appendix is normal. Vascular/Lymphatic: There are no enlarged mesenteric, retroperitoneal, or pelvic lymph nodes. Scattered aortic atherosclerotic calcifications are seen without aneurysmal dilatation. Ectasia of the infrarenal abdominal aorta is seen with calcified and noncalcified plaque measuring up to 2.6 cm para Reproductive: There is a heterogeneously enlarged prostate gland measuring 6 cm in transverse dimension. Other: No evidence of abdominal wall mass. Small bilateral fat containing inguinal hernias are noted. Musculoskeletal: No acute or significant osseous findings. A dextroconvex scoliotic curvature of the lumbar spine is noted. There is advanced degenerative changes seen in the lower lumbar spine at L4-L5 and L5-S1. IMPRESSION: 1. Hyperenhancement of the gallbladder wall with layering gallstones, surrounding inflammatory changes in pericholecystic fluid which could be suggestive of acute cholecystitis. 2. patchy/ground-glass opacities within both lung bases, right greater than left which is non-specific could be due to at atelectasis, and/or early infectious etiology. 3. Diverticulosis without diverticulitis. 4. Nonobstructing 3 mm left renal calculi. Electronically Signed   By: Prudencio Pair M.D.   On: 01/24/2019 23:48      Assessment/Plan Melanoma  DM New onset afib - on heparin gtt, echo pending, elevated troponin likely secondary to demand ischemia per cards  Acute cholecystitis with pancreatitis  The patient has evidence of acute cholecystitis along with pancreatitis likely secondary to gallstones given the elevation in his LFTs on admission.  Repeat labs today are all trending down including his lipase which is down to 200.  He does have pain in his epigastrium and RUQ, but is not severe.  He may have some ice chips today, but otherwise NPO for bowel rest.  NPO p MN for possible OR tomorrow if labs and pain continue to improve.  Await echo results.     Will  need to hold heparin at least 6 hrs prior to surgery.  Continue to follow.  Discussed this plan of care along with disease process with the patient and his wife.  FEN - NPO, IVFs VTE - heparin gtt ID - Rocephin/Flagyl  Henreitta Cea, [facility] Surgery 01/25/2019, 11:43 AM Please see Amion for pager number during day hours 7:00am-4:30pm  Agree with above.  His wife was in the room when I rounded.  He was also getting his cardiac echo when I rounded.  We share a mutual friend, Waverly Ferrari.  CT scan suggests cholecystitis.  His elevated lipase to 892 suggests a component of pancreatitis.  The lipase is rapidly improving.  His echo today shows a 50-55% EF.  Possible surgery tomorrow or Thursday.  Will need to stop heparin prior to surgery.  Alphonsa Overall, MD, [facility] Surgery Office phone:  [phone]

## 2019-01-25 NOTE — H&P (Addendum)
History and Physical    Andre Walker ZJQ:[id number] DOB: [birth year] DOA: 01/24/2019  PCP: Rita Ohara, MD  Patient coming from: Home  I have personally briefly reviewed patient's old medical records in [place]  Chief Complaint: Generalized weakness  HPI: Andre Walker is a 85 y.o. male with medical history significant of DM2, BPH.  Patient presents to the ED with c/o several day h/o generalized weakness.  Poor PO intake, nausea, single episode of vomiting at PCPs office while waiting for EMS.  No fevers, no known COVID exposures.   ED Course: Found to have acute cholecystitis on CT scan.  Alk 622, AST 87, ALT 137, t bili 2.4.  Mild AKI with creat 1.3 up from a 1.0 baseline  Lipase 892.  New onset A.Fib, rate controlled, Trop of 120.  Abd TTP but not painful until palpated.   Review of Systems: As per HPI, otherwise all review of systems negative.  Past Medical History:  Diagnosis Date   Actinic keratosis    Dr. Allyson Sabal   BPH (benign prostatic hyperplasia)    controlled on Hytrin   Colon polyps    Dr. Watt Climes   Diabetes mellitus 2004   ED (erectile dysfunction)    Hearing loss    high frequency   Hiatal hernia    small, noted on EGD 10/2010   Melanoma ([place])    right arm   Microalbuminuria    on ACEI for microalbuminuria (does NOT have HTN)   Pure hypercholesterolemia 2006   Traumatic partial tear of biceps tendon 01/2009   "popeye" injury on right   Wears glasses    Wears partial dentures    lower    Past Surgical History:  Procedure Laterality Date   APPENDECTOMY  1955   CATARACT EXTRACTION Bilateral R 05/07/15, L 05/21/15   Dr. Katy Fitch   COLONOSCOPY  11/09, 10/29/10   Dr. Watt Climes   ESOPHAGOGASTRODUODENOSCOPY  10/29/10   Dr. Watt Climes   INGUINAL HERNIA REPAIR Right 1989   MELANOMA EXCISION  10/08/10   R upper arm, Dr. Hassell Done   MELANOMA EXCISION  03/16/2012   Procedure: MELANOMA EXCISION;  Surgeon: Pedro Earls, MD;  Location: [facility];  Service: General;  Laterality: Right;  excision of melanoma of right arm   TOTAL KNEE ARTHROPLASTY Right 2012   TOTAL KNEE ARTHROPLASTY Left 2005     reports that he has never smoked. He has never used smokeless tobacco. He reports current alcohol use of about 2.0 - 3.0 standard drinks of alcohol per week. He reports that he does not use drugs.  Allergies  Allergen Reactions   Penicillins Rash    Has patient had a PCN reaction causing immediate rash, facial/tongue/throat swelling, SOB or lightheadedness with hypotension: No Has patient had a PCN reaction causing severe rash involving mucus membranes or skin necrosis: No Has patient had a PCN reaction that required hospitalization: No Has patient had a PCN reaction occurring within the last 10 years: No If all of the above answers are "NO", then may proceed with Cephalosporin use.     Family History  Problem Relation Age of Onset   Diabetes Sister    Diabetes Brother    Heart disease Brother    Hyperlipidemia Brother    Hypertension Brother    Stroke Brother    Diabetes Sister    Cancer Paternal Uncle    Heart disease Paternal Uncle      Prior to Admission medications  Medication Sig Start Date End Date Taking? Authorizing Provider  acetaminophen (TYLENOL) 325 MG tablet Take 650 mg by mouth every 6 (six) hours as needed for mild pain, moderate pain or headache.    Yes [provider]  aspirin 81 MG tablet Take 81 mg by mouth daily.     Yes [provider]  Calcium Carbonate-Vitamin D (CALCIUM 600+D HIGH POTENCY) 600-400 MG-UNIT per tablet Take 1 tablet by mouth 2 (two) times daily.    Yes [provider]  cephALEXin (KEFLEX) 500 MG capsule Take 500 mg by mouth 4 (four) times daily. 01/23/19  Yes [provider]  Coenzyme Q10 (COQ10) 100 MG CAPS Take 1 capsule by mouth daily.     Yes [provider]  glucosamine-chondroitin 500-400 MG tablet Take 2 tablets  by mouth daily.    Yes [provider]  glucose blood test strip 1 each by Other route daily. Use as instructed 12/09/17  Yes Rita Ohara, MD  lisinopril (ZESTRIL) 5 MG tablet Take 1 tablet (5 mg total) by mouth daily. 08/02/18  Yes Rita Ohara, MD  loratadine (CLARITIN) 10 MG tablet Take 10 mg by mouth daily.   Yes [provider]  metFORMIN (GLUCOPHAGE-XR) 500 MG 24 hr tablet TAKE 2 TABLETS BY MOUTH ONCE DAILY WITH BREAKFAST Patient taking differently: Take 1,000 mg by mouth daily with breakfast.  11/02/18  Yes Rita Ohara, MD  Multiple Vitamins-Minerals (MULTIVITAMIN WITH MINERALS) tablet Take 1 tablet by mouth daily.     Yes [provider]  Omega-3 Fatty Acids (FISH OIL PO) Take 1,600 mg by mouth daily.   Yes [provider]  [facility] DELICA LANCETS 64W MISC 1 each by Does not apply route daily. 12/09/17  Yes Rita Ohara, MD  simvastatin (ZOCOR) 20 MG tablet Take 1 tablet (20 mg total) by mouth at bedtime. 08/02/18  Yes Rita Ohara, MD  terazosin (HYTRIN) 5 MG capsule Take 1 capsule by mouth at bedtime 01/17/19  Yes Rita Ohara, MD    Physical Exam: Vitals:   01/25/19 0300 01/25/19 0320 01/25/19 0340 01/25/19 0400  BP: (!) 112/55 (!) 99/47 (!) 98/51 108/64  Pulse: 83 66 78 79  Resp: (!) 24 (!) 21 (!) 21 (!) 21  Temp:      TempSrc:      SpO2: 96% 94% 95% 97%  Weight:      Height:        Constitutional: NAD, calm, comfortable Eyes: PERRL, lids and conjunctivae normal ENMT: Mucous membranes are moist. Posterior pharynx clear of any exudate or lesions.Normal dentition.  Neck: normal, supple, no masses, no thyromegaly Respiratory: clear to auscultation bilaterally, no wheezing, no crackles. Normal respiratory effort. No accessory muscle use.  Cardiovascular: Irregularly irregular Abdomen: TTP  Musculoskeletal: no clubbing / cyanosis. No joint deformity upper and lower extremities. Good ROM, no contractures. Normal muscle tone.  Skin: no rashes, lesions,  ulcers. No induration Neurologic: CN 2-12 grossly intact. Sensation intact, DTR normal. Strength 5/5 in all 4.  Psychiatric: Normal judgment and insight. Alert and oriented x 3. Normal mood.    Labs on Admission: I have personally reviewed following labs and imaging studies  CBC: Recent Labs  Lab 01/24/19 2149  WBC 12.2*  NEUTROABS 10.8*  HGB 11.3*  HCT 34.4*  MCV 98.6  PLT 803   Basic Metabolic Panel: Recent Labs  Lab 01/24/19 2149  NA 135  K 3.4*  CL 104  CO2 20*  GLUCOSE 191*  BUN 39*  CREATININE 1.30*  CALCIUM 8.3*   GFR: Estimated Creatinine Clearance: 43.7 mL/min (A) (by C-G formula based on SCr of 1.3 mg/dL (H)). Liver Function Tests: Recent Labs  Lab 01/24/19 2149  AST 87*  ALT 137*  ALKPHOS 622*  BILITOT 2.4*  PROT 6.2*  ALBUMIN 3.2*   Recent Labs  Lab 01/24/19 2354  LIPASE 892*   No results for input(s): AMMONIA in the last 168 hours. Coagulation Profile: No results for input(s): INR, PROTIME in the last 168 hours. Cardiac Enzymes: No results for input(s): CKTOTAL, CKMB, CKMBINDEX, TROPONINI in the last 168 hours. BNP (last 3 results) No results for input(s): PROBNP in the last 8760 hours. HbA1C: No results for input(s): HGBA1C in the last 72 hours. CBG: Recent Labs  Lab 01/24/19 2132  GLUCAP 161*   Lipid Profile: No results for input(s): CHOL, HDL, LDLCALC, TRIG, CHOLHDL, LDLDIRECT in the last 72 hours. Thyroid Function Tests: Recent Labs    01/24/19 2236  TSH 2.541   Anemia Panel: No results for input(s): VITAMINB12, FOLATE, FERRITIN, TIBC, IRON, RETICCTPCT in the last 72 hours. Urine analysis:    Component Value Date/Time   COLORURINE AMBER (A) 01/25/2019 0215   APPEARANCEUR CLEAR 01/25/2019 0215   LABSPEC 1.043 (H) 01/25/2019 0215   LABSPEC 1.015 02/23/2017 0936   PHURINE 5.0 01/25/2019 0215   GLUCOSEU 50 (A) 01/25/2019 0215   HGBUR NEGATIVE 01/25/2019 0215   BILIRUBINUR NEGATIVE 01/25/2019 0215   BILIRUBINUR negative  02/23/2017 0936   BILIRUBINUR neg 03/29/2014 0943   KETONESUR NEGATIVE 01/25/2019 0215   PROTEINUR 30 (A) 01/25/2019 0215   UROBILINOGEN negative 03/29/2014 0943   UROBILINOGEN 0.2 03/25/2010 1030   NITRITE NEGATIVE 01/25/2019 0215   LEUKOCYTESUR NEGATIVE 01/25/2019 0215    Radiological Exams on Admission: Dg Chest 2 View  Result Date: 01/24/2019 CLINICAL DATA:  85 year old male with weakness for several days. EXAM: CHEST - 2 VIEW COMPARISON:  Chest radiographs 01/23/2019 and earlier. FINDINGS: Semi upright AP and lateral views of the chest. Lung volumes are stable and within normal limits. Cardiac size at the upper limits of normal. Other mediastinal contours are within normal limits. No pneumothorax, pulmonary edema, pleural effusion or confluent pulmonary opacity. Right axillary surgical clips. No acute osseous abnormality identified. Negative visible bowel gas pattern. IMPRESSION: No acute cardiopulmonary abnormality. Electronically Signed   By: Genevie Ann M.D.   On: 01/24/2019 21:23   Ct Head Wo Contrast  Result Date: 01/24/2019 CLINICAL DATA:  85 year old male with altered mental status. EXAM: CT HEAD WITHOUT CONTRAST TECHNIQUE: Contiguous axial images were obtained from the base of the skull through the vertex without intravenous contrast. COMPARISON:  None. FINDINGS: Brain: Mild age-related atrophy and chronic microvascular ischemic changes. There is no acute intracranial hemorrhage. No mass effect or midline shift. No extra-axial fluid collection. Vascular: No hyperdense vessel or unexpected calcification. Skull: Normal. Negative for fracture or focal lesion. Sinuses/Orbits: Mild mucoperiosteal thickening paranasal sinuses. There is partial opacification of the left sphenoid sinus. No air-fluid level. The mastoid air cells are clear. Other: None IMPRESSION: 1. No acute intracranial hemorrhage. 2. Mild age-related atrophy and chronic microvascular ischemic changes. Electronically Signed   By:  Anner [place]te M.D.   On: 01/24/2019 21:27   Ct Abdomen Pelvis W Contrast  Result Date: 01/24/2019 CLINICAL DATA:  Acute abdominal pain EXAM: CT ABDOMEN AND PELVIS WITH CONTRAST TECHNIQUE: Multidetector CT imaging of the abdomen and pelvis was performed using the standard protocol following bolus administration of intravenous contrast. CONTRAST:  171m OMNIPAQUE IOHEXOL 300  MG/ML  SOLN COMPARISON:  January 24, 2017 FINDINGS: Lower chest: There is moderate cardiomegaly. Patchy ground-glass opacities seen at both lung bases, right greater than left with tree-in-bud opacity seen peripherally. No hiatal hernia. Hepatobiliary: The liver is normal in density without focal abnormality.The main portal vein is patent. There is mild hyperenhancement of the gallbladder wall with surrounding fat stranding changes and a pericholecystic fluid. There is layering calcified gallstones. No free air however is noted. Mild intrahepatic biliary ductal dilatation is noted. Pancreas: Unremarkable. No pancreatic ductal dilatation or surrounding inflammatory changes. Spleen: Normal in size without focal abnormality. Adrenals/Urinary Tract: Both adrenal glands appear normal. There is a 3 mm calculus seen in the lower pole the left kidney. No hydronephrosis is noted. No ureteral calculi seen. Bladder is unremarkable. Stomach/Bowel: The stomach, small bowel are normal in appearance. Scattered colonic diverticula are noted without diverticulitis. No inflammatory changes, wall thickening, or obstructive findings.The appendix is normal. Vascular/Lymphatic: There are no enlarged mesenteric, retroperitoneal, or pelvic lymph nodes. Scattered aortic atherosclerotic calcifications are seen without aneurysmal dilatation. Ectasia of the infrarenal abdominal aorta is seen with calcified and noncalcified plaque measuring up to 2.6 cm para Reproductive: There is a heterogeneously enlarged prostate gland measuring 6 cm in transverse dimension. Other:  No evidence of abdominal wall mass. Small bilateral fat containing inguinal hernias are noted. Musculoskeletal: No acute or significant osseous findings. A dextroconvex scoliotic curvature of the lumbar spine is noted. There is advanced degenerative changes seen in the lower lumbar spine at L4-L5 and L5-S1. IMPRESSION: 1. Hyperenhancement of the gallbladder wall with layering gallstones, surrounding inflammatory changes in pericholecystic fluid which could be suggestive of acute cholecystitis. 2. patchy/ground-glass opacities within both lung bases, right greater than left which is non-specific could be due to at atelectasis, and/or early infectious etiology. 3. Diverticulosis without diverticulitis. 4. Nonobstructing 3 mm left renal calculi. Electronically Signed   By: Prudencio Pair M.D.   On: 01/24/2019 23:48    EKG: Independently reviewed.  Assessment/Plan Principal Problem:   Acute cholecystitis Active Problems:   Pure hypercholesterolemia   AKI (acute kidney injury) ([place])   Diabetes mellitus with microalbuminuria ([place])   New onset a-fib ([place])    1. Acute cholecystitis - 1. Gen surg to see in AM 2. NPO 3. Got cefepime and flagyl in ED, will put him on rocephin / flagyl. 4. IVF: NS at 100 5. Repeat CBC/CMP in AM 6. Zofran PRN 2. Mild AKI - likely pre-renal due to poor PO intake 1. IVF as above 2. Hold lisinopril 3. New onset A.Fib - 1. Heparin gtt for now 2. 2d echo 3. Is already rate controlled however 4. Cards was unimpressed with the trop of 120, no CP 4. DM2 - 1. Hold metformin 2. Sensitive SSI Q4H 5. HLD - cont statin  DVT prophylaxis: heparin gtt for a.fib Code Status: Full Family Communication: No family in room Disposition Plan: Home after admit Consults called: Gen surgery Admission status: Admit to inpatient  Severity of Illness: The appropriate patient status for this patient is INPATIENT. Inpatient status is judged to be reasonable and necessary in order to  provide the required intensity of service to ensure the patient's safety. The patient's presenting symptoms, physical exam findings, and initial radiographic and laboratory data in the context of their chronic comorbidities is felt to place them at high risk for further clinical deterioration. Furthermore, it is not anticipated that the patient will be medically stable for discharge from the hospital within 2 midnights of  admission. The following factors support the patient status of inpatient.   IP status for acute cholecystitis.   * I certify that at the point of admission it is my clinical judgment that the patient will require inpatient hospital care spanning beyond 2 midnights from the point of admission due to high intensity of service, high risk for further deterioration and high frequency of surveillance required.*    Canio Winokur M. DO [facility]  How to contact the [facility] Attending or Consulting provider [place] or covering provider during after hours [place], for this patient?  1. Check the care team in [facility] and look for a) attending/consulting [facility] provider listed and b) the [facility] team listed 2. Log into [email or web address]  Amion Physician Scheduling and messaging for groups and whole hospitals  On call and physician scheduling software for group practices, residents, hospitalists and other medical providers for call, clinic, rotation and shift schedules. OnCall [employer] is a hospital-wide system for scheduling doctors and paging doctors on call. EasyPlot is for scientific plotting and data analysis.  [email or web address]  and use [facility]'s universal password to access. If you do not have the password, please contact the hospital operator.  3. Locate the [facility] provider you are looking for under [facility] and page to a number that you can be directly reached. 4. If you still have difficulty reaching the provider, please page the [facility] (Director on Call) for the Hospitalists listed on  amion for assistance.  01/25/2019, 4:14 AM

## 2019-01-25 NOTE — ED Provider Notes (Signed)
Patient referred to the emergency department from primary care after being evaluated earlier today.  Patient presented for generalized weakness for several days, had one episode of nausea and vomiting.  Work-up initiated by Dr. Lorretta Harp, signed out to me.  Physical Exam  BP (!) 112/55   Pulse 83   Temp 98 F (36.7 C) (Oral)   Resp (!) 24   Ht 5\' 10"  (1.778 m)   Wt 87.5 kg   SpO2 96%   BMI 27.69 kg/m   Physical Exam Constitutional:      Appearance: Normal appearance.  HENT:     Head: Normocephalic.  Neck:     Musculoskeletal: Normal range of motion.  Cardiovascular:     Rate and Rhythm: Normal rate. Rhythm irregular.  Pulmonary:     Effort: Pulmonary effort is normal.     Breath sounds: Normal breath sounds.  Abdominal:     Tenderness: There is abdominal tenderness in the right upper quadrant. There is guarding. There is no rebound.  Musculoskeletal: Normal range of motion.  Skin:    General: Skin is warm and dry.  Neurological:     General: No focal deficit present.     Mental Status: He is alert and oriented to person, place, and time.     ED Course/Procedures     .Critical Care Performed by: Orpah Greek, MD Authorized by: Orpah Greek, MD   Critical care provider statement:    Critical care time (minutes):  35   Critical care time was exclusive of:  Separately billable procedures and treating other patients   Critical care was necessary to treat or prevent imminent or life-threatening deterioration of the following conditions:  Sepsis   Critical care was time spent personally by me on the following activities:  Ordering and performing treatments and interventions, development of treatment plan with patient or surrogate, ordering and review of laboratory studies, discussions with consultants, pulse oximetry, evaluation of patient's response to treatment, re-evaluation of patient's condition, examination of patient and review of old charts   I  assumed direction of critical care for this patient from another provider in my specialty: yes      MDM  Patient evaluated at arrival and noted to have some abdominal tenderness.  His chief complaint was generalized weakness.  Basic labs ordered and was found to have mildly elevated LFTs.  CT scan was ordered.  Patient was signed out to me at this point.  Remainder of labs have now returned.  He does have a mild elevation of troponin at 123.  EKG reviewed, patient in atrial fibrillation that is rate controlled.  He does not, however, have any rate controlling medications in his normal med list.  He is not actively having any chest pain currently.  This has been discussed with Dr. Paticia Stack, on-call for cardiology.  He did not feel that the patient required transfer to [facility], can be consulted on by cardiology in the morning.  Recommends heparinization to treat atrial fibrillation and positive troponin.  CT abdomen and pelvis has returned.  Patient has definitive signs of acute cholecystitis.  Discussed with Dr. Redmond Pulling, on-call for general surgery.  Patient will be seen in the morning by surgery team.  Recommends trending LFTs, lipase.  Agrees with antibiotic coverage.  Patient administered cefepime and Flagyl for high risk cholecystitis.  CT also shows evidence of bilateral pneumonia.  Admitting team can determine if he needs additional antibiotics to cover for this or if cefepime alone will  cover.  Patient received 2 L of IV fluids here in the ER.  Blood pressures have been slightly soft, however his MAPs have been okay.  Last blood pressure 112/55, has not received any fluids in quite some time, felt to be appropriate for hospitalist admission.       Orpah Greek, MD 01/25/19 [phone]

## 2019-01-25 NOTE — Progress Notes (Signed)
ANTICOAGULATION CONSULT NOTE - Initial Consult  Pharmacy Consult for heparin Indication: atrial fibrillation  Allergies  Allergen Reactions  . Penicillins Rash    Has patient had a PCN reaction causing immediate rash, facial/tongue/throat swelling, SOB or lightheadedness with hypotension: No Has patient had a PCN reaction causing severe rash involving mucus membranes or skin necrosis: No Has patient had a PCN reaction that required hospitalization: No Has patient had a PCN reaction occurring within the last 10 years: No If all of the above answers are "NO", then may proceed with Cephalosporin use.     Patient Measurements: Height: 5\' 10"  (177.8 cm) Weight: 189 lb 6 oz (85.9 kg) IBW/kg (Calculated) : 73 Heparin Dosing Weight = TBW = 86 kg  Vital Signs: Temp: 98 F (36.7 C) (12/01 0642) Temp Source: Oral (12/01 0642) BP: 112/64 (12/01 [id number]) Pulse Rate: 78 (12/01 0642)  Labs: Recent Labs    01/24/19 2149  01/25/19 0214 01/25/19 0500 01/25/19 0828 01/25/19 1003  HGB 11.3*  --   --  9.8*  --   --   HCT 34.4*  --   --  30.5*  --   --   PLT 252  --   --  223  --   --   CREATININE 1.30*  --   --  1.14  --   --   TROPONINIHS  --    < > 126*  --  77* 71*   < > = values in this interval not displayed.    Estimated Creatinine Clearance: 49.8 mL/min (by C-G formula based on SCr of 1.14 mg/dL).   Medical History: Past Medical History:  Diagnosis Date  . Actinic keratosis    Dr. Allyson Sabal  . BPH (benign prostatic hyperplasia)    controlled on Hytrin  . Colon polyps    Dr. Watt Climes  . Diabetes mellitus 2004  . ED (erectile dysfunction)   . Hearing loss    high frequency  . Hiatal hernia    small, noted on EGD 10/2010  . Melanoma ([place])    right arm  . Microalbuminuria    on ACEI for microalbuminuria (does NOT have HTN)  . Pure hypercholesterolemia 2006  . Traumatic partial tear of biceps tendon 01/2009   "popeye" injury on right  . Wears glasses   . Wears partial  dentures    lower    Assessment: Pharmacy consulted to dose/monitor heparin in this 85 year old male with new onset atrial fibrillation. Not taking anticoagulants PTA.   Baseline labs: -Hgb 11.3, Plt 252  Today, 01/25/19  Hgb 9.8 - low and decreased. Of note, pt had poor PO intake PTA, could be dilutional  Plt 223 - WNL, stable  HL < 0.10 is undetectable on heparin infusion of 1050 units/hr  Confirmed with RN that heparin infusing at correct rate with no issues/interruptions. No signs/symptoms of bruising or bleeding.  Patient also diagnosed with acute cholecystitis with gallstone pancreatitis - being followed by surgery. Possibly will require surgical intervention.  Goal of Therapy:  Heparin level 0.3-0.7 units/ml Monitor platelets by anticoagulation protocol: Yes   Plan:   Heparin bolus of 2000 units x1  Increase heparin infusion to 1250 units/hr  Check HL in 8 hours  HL and CBC daily while on heparin  Monitor for signs/symptoms of bleeding or thrombosis  If patient ends up requiring surgical intervention, surgery to advise on when to hold heparin prior to procedure.  Lenis Noon, PharmD 01/25/2019,1:10 PM

## 2019-01-25 NOTE — ED Notes (Signed)
Date and time results received: 01/25/19 1:21 AM  (use smartphrase ".now" to insert current time)  Test: Troponin Critical Value: 123  Name of Provider Notified: Dr.Pollina  Orders Received? Or Actions Taken?:

## 2019-01-25 NOTE — Progress Notes (Signed)
ANTICOAGULATION CONSULT NOTE - Initial Consult  Pharmacy Consult for heparin Indication: atrial fibrillation  Allergies  Allergen Reactions  . Penicillins Rash    Has patient had a PCN reaction causing immediate rash, facial/tongue/throat swelling, SOB or lightheadedness with hypotension: No Has patient had a PCN reaction causing severe rash involving mucus membranes or skin necrosis: No Has patient had a PCN reaction that required hospitalization: No Has patient had a PCN reaction occurring within the last 10 years: No If all of the above answers are "NO", then may proceed with Cephalosporin use.     Patient Measurements: Height: 5\' 10"  (177.8 cm) Weight: 193 lb (87.5 kg) IBW/kg (Calculated) : 73 Heparin Dosing Weight:   Vital Signs: Temp: 98 F (36.7 C) (11/30 2048) Temp Source: Oral (11/30 2048) BP: 112/55 (12/01 0300) Pulse Rate: 83 (12/01 0300)  Labs: Recent Labs    01/24/19 2149 01/24/19 2354  HGB 11.3*  --   HCT 34.4*  --   PLT 252  --   CREATININE 1.30*  --   TROPONINIHS  --  123*    Estimated Creatinine Clearance: 43.7 mL/min (A) (by C-G formula based on SCr of 1.3 mg/dL (H)).   Medical History: Past Medical History:  Diagnosis Date  . Actinic keratosis    Dr. Allyson Sabal  . BPH (benign prostatic hyperplasia)    controlled on Hytrin  . Colon polyps    Dr. Watt Climes  . Diabetes mellitus 2004  . ED (erectile dysfunction)   . Hearing loss    high frequency  . Hiatal hernia    small, noted on EGD 10/2010  . Melanoma ([place])    right arm  . Microalbuminuria    on ACEI for microalbuminuria (does NOT have HTN)  . Pure hypercholesterolemia 2006  . Traumatic partial tear of biceps tendon 01/2009   "popeye" injury on right  . Wears glasses   . Wears partial dentures    lower    Medications:  Infusions:  . heparin    . metronidazole 500 mg (01/25/19 0258)    Assessment: Patient with new afib and MD wishes heparin per pharmacy for afib.  No oral  anticoagulants noted on med rec.   Goal of Therapy:  Heparin level 0.3-0.7 units/ml Monitor platelets by anticoagulation protocol: Yes   Plan:  Heparin bolus 2000 units iv x1 Heparin drip at 1050 units/hr Daily CBC Next heparin level at [place], [place] 01/25/2019,3:18 AM

## 2019-01-26 ENCOUNTER — Ambulatory Visit: Payer: Medicare Other | Admitting: Family Medicine

## 2019-01-26 ENCOUNTER — Other Ambulatory Visit: Payer: Medicare Other

## 2019-01-26 DIAGNOSIS — K810 Acute cholecystitis: Secondary | ICD-10-CM

## 2019-01-26 LAB — CBC
HCT: 31.3 % — ABNORMAL LOW (ref 39.0–52.0)
Hemoglobin: 9.8 g/dL — ABNORMAL LOW (ref 13.0–17.0)
MCH: 31.7 pg (ref 26.0–34.0)
MCHC: 31.3 g/dL (ref 30.0–36.0)
MCV: 101.3 fL — ABNORMAL HIGH (ref 80.0–100.0)
Platelets: 232 10*3/uL (ref 150–400)
RBC: 3.09 MIL/uL — ABNORMAL LOW (ref 4.22–5.81)
RDW: 13.1 % (ref 11.5–15.5)
WBC: 8.6 10*3/uL (ref 4.0–10.5)
nRBC: 0 % (ref 0.0–0.2)

## 2019-01-26 LAB — GLUCOSE, CAPILLARY
Glucose-Capillary: 117 mg/dL — ABNORMAL HIGH (ref 70–99)
Glucose-Capillary: 130 mg/dL — ABNORMAL HIGH (ref 70–99)
Glucose-Capillary: 133 mg/dL — ABNORMAL HIGH (ref 70–99)
Glucose-Capillary: 64 mg/dL — ABNORMAL LOW (ref 70–99)
Glucose-Capillary: 75 mg/dL (ref 70–99)
Glucose-Capillary: 87 mg/dL (ref 70–99)

## 2019-01-26 LAB — COMPREHENSIVE METABOLIC PANEL
ALT: 85 U/L — ABNORMAL HIGH (ref 0–44)
AST: 31 U/L (ref 15–41)
Albumin: 2.7 g/dL — ABNORMAL LOW (ref 3.5–5.0)
Alkaline Phosphatase: 353 U/L — ABNORMAL HIGH (ref 38–126)
Anion gap: 9 (ref 5–15)
BUN: 31 mg/dL — ABNORMAL HIGH (ref 8–23)
CO2: 20 mmol/L — ABNORMAL LOW (ref 22–32)
Calcium: 7.8 mg/dL — ABNORMAL LOW (ref 8.9–10.3)
Chloride: 109 mmol/L (ref 98–111)
Creatinine, Ser: 0.98 mg/dL (ref 0.61–1.24)
GFR calc Af Amer: >60 mL/min (ref >60)
GFR calc non Af Amer: >60 mL/min (ref >60)
Glucose, Bld: 83 mg/dL (ref 70–99)
Potassium: 3.6 mmol/L (ref 3.5–5.1)
Sodium: 138 mmol/L (ref 135–145)
Total Bilirubin: 1.2 mg/dL (ref 0.3–1.2)
Total Protein: 5.6 g/dL — ABNORMAL LOW (ref 6.5–8.1)

## 2019-01-26 LAB — HEPARIN LEVEL (UNFRACTIONATED)
Heparin Unfractionated: <0.1 IU/mL — ABNORMAL LOW (ref 0.30–0.70)
Heparin Unfractionated: <0.1 IU/mL — ABNORMAL LOW (ref 0.30–0.70)

## 2019-01-26 LAB — LIPASE, BLOOD: Lipase: 279 U/L — ABNORMAL HIGH (ref 11–51)

## 2019-01-26 MED ORDER — HEPARIN (PORCINE) 25000 UT/250ML-% IV SOLN
1600.0000 [IU]/h | INTRAVENOUS | Status: AC
  Administered 2019-01-26 (×2): 1600 [IU]/h via INTRAVENOUS
  Filled 2019-01-26: qty 250

## 2019-01-26 MED ORDER — HEPARIN BOLUS VIA INFUSION
2000.0000 [IU] | Freq: Once | INTRAVENOUS | Status: AC
  Administered 2019-01-26: 2000 [IU] via INTRAVENOUS
  Filled 2019-01-26: qty 2000

## 2019-01-26 MED ORDER — DEXTROSE 50 % IV SOLN
INTRAVENOUS | Status: AC
  Administered 2019-01-26: 25 mL
  Filled 2019-01-26: qty 50

## 2019-01-26 NOTE — Progress Notes (Signed)
PROGRESS NOTE  Andre Walker  DOB: [birth year]  PCP: Rita Ohara, MD [id number]  DOA: 01/24/2019  LOS: 1 day   Chief Complaint  Patient presents with  . Weakness   Brief narrative: Andre Walker is a 85 y.o. male with medical history significant of DM2, BPH, melanoma. Patient started having nausea, vomiting and malaise last week on Thanksgiving day before any meal.  2 days later, 11/28, he went to urgent care and was told to have UTI.  It is unclear if he was started on the antibiotics. 11/30, he returned back to urgent care when the symptoms did not improve and was ultimately sent to the ED. In the ED, patient was in A flutter, rate controlled. CT scan of abdomen showed gallbladder wall thickening with gallstones and pericholecystic fluid c/w acute cholecystitis. Labs showed Alk 622, AST 87, ALT 137, t bili 2.4, lipase level over 800, creatinine up at 1.3 Patient was started on IV antibiotics, heparin drip, admitted to hospitalist service and consulted with general surgery.  Subjective: Patient was seen and examined this morning.  Pleasant, elderly Caucasian male.  Not in distress.  Assessment/Plan: Acute cholecystitis  -Presented with nausea, vomiting.  -CT scan finding as above. -Liver enzyme level elevated.  Improving. -General surgery consult appreciated. -Noted a plan for cholecystectomy tomorrow.  Mild pancreatitis -Lipase level was elevated to 800s.  Currently on conservative management.  Labs are improving.   -Currently on heparin drip, to be held at midnight tonight for surgery tomorrow.   -On clear liquid diet today per general surgery.    Mild AKI -Creatinine elevated to 1.3 from normal. -Monitor with IV hydration.  Lisinopril remain on hold.  New onset atrial flutter -Self rate controlled.  Classic flutter valves seen in EKG. -On heparin drip for anticoagulation. -We will watch for resolution of A. fib with improvement of infection. -If persistent A. fib,  patient may benefit from anticoagulation long-term. -Echocardiogram showed EF of 50 to 55%.  Diabetes mellitus Hypoglycemia -Metformin on hold.  Patient reports A1c of 6.2.  Currently on sliding scale insulin with Accu-Cheks.  Patient's blood sugar level was low at 60s this morning.  Continue to monitor blood sugar level while on clear liquid diet today.  If needed, will start on dextrose drip.  Hyperlipidemia -continue statin.  DVT prophylaxis: heparin gtt for a.fib Code Status: Full Family Communication: No family in room Disposition Plan: Home after admit Consults called: Gen surgery Admission status: Admit to inpatient   Body mass index is 27.17 kg/m. Mobility: Encourage ambulation.  Patient was independent previously. Diet: Currently on clear liquid diet. Fluid: Normal saline at 100 mL/h DVT prophylaxis:  Heparin drip Code Status:   Code Status: Full Code  Family Communication:  No one at bedside Expected Discharge:  2 to 3 days to home most likely  Consultants:  General surgery  Procedures:  None  Antimicrobials: Anti-infectives (From admission, onward)   Start     Dose/Rate Route Frequency Ordered Stop   01/25/19 1000  cefTRIAXone (ROCEPHIN) 2 g in sodium chloride 0.9 % 100 mL IVPB     2 g 200 mL/hr over 30 Minutes Intravenous Every 24 hours 01/25/19 0344     01/25/19 0800  metroNIDAZOLE (FLAGYL) IVPB 500 mg     500 mg 100 mL/hr over 60 Minutes Intravenous Every 8 hours 01/25/19 0344     01/25/19 0145  ceFEPIme (MAXIPIME) 2 g in sodium chloride 0.9 % 100 mL IVPB     2  g 200 mL/hr over 30 Minutes Intravenous  Once 01/25/19 0143 01/25/19 0257   01/25/19 0145  metroNIDAZOLE (FLAGYL) IVPB 500 mg     500 mg 100 mL/hr over 60 Minutes Intravenous  Once 01/25/19 0143 01/25/19 0402      Diet Order            Diet NPO time specified Except for: Sips with Meds  Diet effective midnight        Diet clear liquid Room service appropriate? Yes; Fluid consistency: Thin   Diet effective now              Infusions:  . sodium chloride 100 mL/hr at 01/26/19 1336  . cefTRIAXone (ROCEPHIN)  IV 2 g (01/26/19 1121)  . heparin 1,400 Units/hr (01/26/19 0302)  . metronidazole 500 mg (01/26/19 0926)    Scheduled Meds: . insulin aspart  0-9 Units Subcutaneous Q4H  . simvastatin  20 mg Oral QHS  . terazosin  5 mg Oral QHS    PRN meds: acetaminophen **OR** acetaminophen, ondansetron **OR** ondansetron (ZOFRAN) IV   Objective: Vitals:   01/26/19 0544 01/26/19 1306  BP: 125/88 118/84  Pulse: 75 64  Resp: 18   Temp: 97.8 F (36.6 C) 97.8 F (36.6 C)  SpO2: 97% 97%    Intake/Output Summary (Last 24 hours) at 01/26/2019 1400 Last data filed at 01/26/2019 1312 Gross per 24 hour  Intake 2755.76 ml  Output 1225 ml  Net 1530.76 ml   Filed Weights   01/25/19 0215 01/25/19 0604  Weight: 87.5 kg 85.9 kg   Weight change:  Body mass index is 27.17 kg/m.   Physical Exam: General exam: Appears calm and comfortable.  Skin: No rashes, lesions or ulcers. HEENT: Atraumatic, normocephalic, supple neck, no obvious bleeding Lungs: Clear to auscultation bilaterally CVS: Regular rhythm, controlled rate, no murmur GI/Abd soft, nondistended, tenderness present in right upper quadrant and epigastric area.  Bowel sound present CNS: Awake, alert, oriented x3 Psychiatry: Mood appropriate Extremities: No pedal edema, no calf tenderness  Data Review: I have personally reviewed the laboratory data and studies available.  Recent Labs  Lab 01/24/19 2149 01/25/19 0500 01/26/19 0334  WBC 12.2* 11.6* 8.6  NEUTROABS 10.8*  --   --   HGB 11.3* 9.8* 9.8*  HCT 34.4* 30.5* 31.3*  MCV 98.6 100.3* 101.3*  PLT 252 223 232   Recent Labs  Lab 01/24/19 2149 01/25/19 0500 01/26/19 0334  NA 135 138 138  K 3.4* 3.7 3.6  CL 104 107 109  CO2 20* 22 20*  GLUCOSE 191* 152* 83  BUN 39* 36* 31*  CREATININE 1.30* 1.14 0.98  CALCIUM 8.3* 8.0* 7.8*   Recent Labs  Lab  01/24/19 2149 01/25/19 0500 01/26/19 0334  AST 87* 50* 31  ALT 137* 109* 85*  ALKPHOS 622* 459* 353*  BILITOT 2.4* 1.3* 1.2  PROT 6.2* 5.6* 5.6*  ALBUMIN 3.2* 2.7* 2.7*    Terrilee Croak, MD  [facility] 01/26/2019

## 2019-01-26 NOTE — Progress Notes (Signed)
ANTICOAGULATION CONSULT NOTE - Initial Consult  Pharmacy Consult for heparin Indication: atrial fibrillation  Allergies  Allergen Reactions  . Penicillins Rash    Has patient had a PCN reaction causing immediate rash, facial/tongue/throat swelling, SOB or lightheadedness with hypotension: No Has patient had a PCN reaction causing severe rash involving mucus membranes or skin necrosis: No Has patient had a PCN reaction that required hospitalization: No Has patient had a PCN reaction occurring within the last 10 years: No If all of the above answers are "NO", then may proceed with Cephalosporin use.     Patient Measurements: Height: 5\' 10"  (177.8 cm) Weight: 189 lb 6 oz (85.9 kg) IBW/kg (Calculated) : 73 Heparin Dosing Weight = TBW = 86 kg  Vital Signs: Temp: 97.8 F (36.6 C) (12/02 1306) Temp Source: Oral (12/02 1306) BP: 118/84 (12/02 1306) Pulse Rate: 64 (12/02 1306)  Labs: Recent Labs    01/24/19 2149  01/25/19 0214 01/25/19 0500 01/25/19 0828 01/25/19 1003 01/25/19 1252 01/25/19 2256 01/26/19 0334 01/26/19 1136  HGB 11.3*  --   --  9.8*  --   --   --   --  9.8*  --   HCT 34.4*  --   --  30.5*  --   --   --   --  31.3*  --   PLT 252  --   --  223  --   --   --   --  232  --   HEPARINUNFRC  --   --   --   --   --   --  <0.10* <0.10*  --  <0.10*  CREATININE 1.30*  --   --  1.14  --   --   --   --  0.98  --   TROPONINIHS  --    < > 126*  --  77* 71*  --   --   --   --    < > = values in this interval not displayed.    Estimated Creatinine Clearance: 57.9 mL/min (by C-G formula based on SCr of 0.98 mg/dL).   Medical History: Past Medical History:  Diagnosis Date  . Actinic keratosis    Dr. Allyson Sabal  . BPH (benign prostatic hyperplasia)    controlled on Hytrin  . Colon polyps    Dr. Watt Climes  . Diabetes mellitus 2004  . ED (erectile dysfunction)   . Hearing loss    high frequency  . Hiatal hernia    small, noted on EGD 10/2010  . Melanoma ([place])    right arm   . Microalbuminuria    on ACEI for microalbuminuria (does NOT have HTN)  . Pure hypercholesterolemia 2006  . Traumatic partial tear of biceps tendon 01/2009   "popeye" injury on right  . Wears glasses   . Wears partial dentures    lower    Assessment: Pharmacy consulted to dose/monitor heparin in this 85 year old male with new onset atrial fibrillation. Not taking anticoagulants PTA.   Baseline labs: -Hgb 11.3, Plt 252  Today, 01/26/19  Hgb 9.8 - low but stable compared to yesterday.  Plt 232 - WNL, stable  HL < 0.10 remains undetectable despite increasing rate of heparin infusion. Currently on heparin infusion of 1400 units/hr.  If HL remains undetectable, recommend checking antithrombin level. Discussed with MD.  Confirmed with RN that heparin infusing at correct rate with no issues/interruptions, no issues with pump. No signs/symptoms of bruising or bleeding.  Patient scheduled for  surgery on 12/3 with orders to stop heparin infusion at 0000 on 12/3.   Goal of Therapy:  Heparin level 0.3-0.7 units/ml Monitor platelets by anticoagulation protocol: Yes   Plan:   Heparin bolus of 2000 units x1  Increase heparin infusion to 1600 units/hr  Check HL in 8 hours  HL and CBC daily while on heparin  Monitor for signs/symptoms of bleeding or thrombosis  Heparin scheduled to stop on 12/3 @ 0000 prior to surgery.   Follow for plan for anticoagulation post-op  Lenis Noon, PharmD 01/26/2019,2:36 PM

## 2019-01-26 NOTE — Progress Notes (Addendum)
Patient ID: Andre Walker, male   DOB: [birth year], 85 y.o.   MRN: [id number]       Subjective: Patient feels well today.  No abdominal pain.  No nausea.  ROS: See above, otherwise other systems negative  Objective: Vital signs in last 24 hours: Temp:  [97.7 F (36.5 C)-98.1 F (36.7 C)] 97.8 F (36.6 C) (12/02 0544) Pulse Rate:  [63-86] 75 (12/02 0544) Resp:  [16-20] 18 (12/02 0544) BP: (104-125)/(67-88) 125/88 (12/02 0544) SpO2:  [96 %-97 %] 97 % (12/02 0544) Last BM Date: 01/25/19  Intake/Output from previous day: 12/01 0701 - 12/02 0700 In: 2755.8 [P.O.:60; I.V.:2295.8; IV Piggyback:400] Out: 1225 [Urine:1225] Intake/Output this shift: No intake/output data recorded.  PE: Heart: irregular, but in 70s Lungs: CTAB Abd: soft, essentially nontedner, +BS, ND  Lab Results:  Recent Labs    01/25/19 0500 01/26/19 0334  WBC 11.6* 8.6  HGB 9.8* 9.8*  HCT 30.5* 31.3*  PLT 223 232   BMET Recent Labs    01/25/19 0500 01/26/19 0334  NA 138 138  K 3.7 3.6  CL 107 109  CO2 22 20*  GLUCOSE 152* 83  BUN 36* 31*  CREATININE 1.14 0.98  CALCIUM 8.0* 7.8*   PT/INR No results for input(s): LABPROT, INR in the last 72 hours. CMP     Component Value Date/Time   NA 138 01/26/2019 0334   NA 140 12/22/2018 0830   K 3.6 01/26/2019 0334   CL 109 01/26/2019 0334   CO2 20 (L) 01/26/2019 0334   GLUCOSE 83 01/26/2019 0334   BUN 31 (H) 01/26/2019 0334   BUN 20 12/22/2018 0830   CREATININE 0.98 01/26/2019 0334   CREATININE 1.07 01/29/2017 1242   CALCIUM 7.8 (L) 01/26/2019 0334   PROT 5.6 (L) 01/26/2019 0334   PROT 6.5 12/22/2018 0830   ALBUMIN 2.7 (L) 01/26/2019 0334   ALBUMIN 4.1 12/22/2018 0830   AST 31 01/26/2019 0334   ALT 85 (H) 01/26/2019 0334   ALKPHOS 353 (H) 01/26/2019 0334   BILITOT 1.2 01/26/2019 0334   BILITOT 0.6 12/22/2018 0830   GFRNONAA >60 01/26/2019 0334   GFRAA >60 01/26/2019 0334   Lipase     Component Value Date/Time   LIPASE 279 (H)  01/26/2019 0334       Studies/Results: Dg Chest 2 View  Result Date: 01/24/2019 CLINICAL DATA:  85 year old male with weakness for several days. EXAM: CHEST - 2 VIEW COMPARISON:  Chest radiographs 01/23/2019 and earlier. FINDINGS: Semi upright AP and lateral views of the chest. Lung volumes are stable and within normal limits. Cardiac size at the upper limits of normal. Other mediastinal contours are within normal limits. No pneumothorax, pulmonary edema, pleural effusion or confluent pulmonary opacity. Right axillary surgical clips. No acute osseous abnormality identified. Negative visible bowel gas pattern. IMPRESSION: No acute cardiopulmonary abnormality. Electronically Signed   By: Genevie Ann M.D.   On: 01/24/2019 21:23   Ct Head Wo Contrast  Result Date: 01/24/2019 CLINICAL DATA:  85 year old male with altered mental status. EXAM: CT HEAD WITHOUT CONTRAST TECHNIQUE: Contiguous axial images were obtained from the base of the skull through the vertex without intravenous contrast. COMPARISON:  None. FINDINGS: Brain: Mild age-related atrophy and chronic microvascular ischemic changes. There is no acute intracranial hemorrhage. No mass effect or midline shift. No extra-axial fluid collection. Vascular: No hyperdense vessel or unexpected calcification. Skull: Normal. Negative for fracture or focal lesion. Sinuses/Orbits: Mild mucoperiosteal thickening paranasal sinuses. There is partial opacification of the  left sphenoid sinus. No air-fluid level. The mastoid air cells are clear. Other: None IMPRESSION: 1. No acute intracranial hemorrhage. 2. Mild age-related atrophy and chronic microvascular ischemic changes. Electronically Signed   By: Anner Crete M.D.   On: 01/24/2019 21:27   Ct Abdomen Pelvis W Contrast  Result Date: 01/24/2019 CLINICAL DATA:  Acute abdominal pain EXAM: CT ABDOMEN AND PELVIS WITH CONTRAST TECHNIQUE: Multidetector CT imaging of the abdomen and pelvis was performed using the  standard protocol following bolus administration of intravenous contrast. CONTRAST:  179mL OMNIPAQUE IOHEXOL 300 MG/ML  SOLN COMPARISON:  January 24, 2017 FINDINGS: Lower chest: There is moderate cardiomegaly. Patchy ground-glass opacities seen at both lung bases, right greater than left with tree-in-bud opacity seen peripherally. No hiatal hernia. Hepatobiliary: The liver is normal in density without focal abnormality.The main portal vein is patent. There is mild hyperenhancement of the gallbladder wall with surrounding fat stranding changes and a pericholecystic fluid. There is layering calcified gallstones. No free air however is noted. Mild intrahepatic biliary ductal dilatation is noted. Pancreas: Unremarkable. No pancreatic ductal dilatation or surrounding inflammatory changes. Spleen: Normal in size without focal abnormality. Adrenals/Urinary Tract: Both adrenal glands appear normal. There is a 3 mm calculus seen in the lower pole the left kidney. No hydronephrosis is noted. No ureteral calculi seen. Bladder is unremarkable. Stomach/Bowel: The stomach, small bowel are normal in appearance. Scattered colonic diverticula are noted without diverticulitis. No inflammatory changes, wall thickening, or obstructive findings.The appendix is normal. Vascular/Lymphatic: There are no enlarged mesenteric, retroperitoneal, or pelvic lymph nodes. Scattered aortic atherosclerotic calcifications are seen without aneurysmal dilatation. Ectasia of the infrarenal abdominal aorta is seen with calcified and noncalcified plaque measuring up to 2.6 cm para Reproductive: There is a heterogeneously enlarged prostate gland measuring 6 cm in transverse dimension. Other: No evidence of abdominal wall mass. Small bilateral fat containing inguinal hernias are noted. Musculoskeletal: No acute or significant osseous findings. A dextroconvex scoliotic curvature of the lumbar spine is noted. There is advanced degenerative changes seen in the  lower lumbar spine at L4-L5 and L5-S1. IMPRESSION: 1. Hyperenhancement of the gallbladder wall with layering gallstones, surrounding inflammatory changes in pericholecystic fluid which could be suggestive of acute cholecystitis. 2. patchy/ground-glass opacities within both lung bases, right greater than left which is non-specific could be due to at atelectasis, and/or early infectious etiology. 3. Diverticulosis without diverticulitis. 4. Nonobstructing 3 mm left renal calculi. Electronically Signed   By: Prudencio Pair M.D.   On: 01/24/2019 23:48    Anti-infectives: Anti-infectives (From admission, onward)   Start     Dose/Rate Route Frequency Ordered Stop   01/25/19 1000  cefTRIAXone (ROCEPHIN) 2 g in sodium chloride 0.9 % 100 mL IVPB     2 g 200 mL/hr over 30 Minutes Intravenous Every 24 hours 01/25/19 0344     01/25/19 0800  metroNIDAZOLE (FLAGYL) IVPB 500 mg     500 mg 100 mL/hr over 60 Minutes Intravenous Every 8 hours 01/25/19 0344     01/25/19 0145  ceFEPIme (MAXIPIME) 2 g in sodium chloride 0.9 % 100 mL IVPB     2 g 200 mL/hr over 30 Minutes Intravenous  Once 01/25/19 0143 01/25/19 0257   01/25/19 0145  metroNIDAZOLE (FLAGYL) IVPB 500 mg     500 mg 100 mL/hr over 60 Minutes Intravenous  Once 01/25/19 0143 01/25/19 0402       Assessment/Plan Melanoma  DM New onset afib - on heparin gtt, echo appears ok with EF of  50-55%, elevated troponin likely secondary to demand ischemia per cards  Acute cholecystitis with pancreatitis  -hold heparin at MN tonight for OR tomorrow  -CLD today and NPO p MN  -pulm toilet, mobilize  -labs overall improving. Lipase bumped slightly today to 276 from 200s, but with no pain and LFTs trending down.  Will likely need cholangiogram to rule out possibility of residual choledocholithiasis.  FEN - CLD, NPO p MN VTE - heparin gtt, hold at MN tonight ID - Rocephin/Flagyl   LOS: 1 day    Henreitta Cea , [facility] 01/26/2019,  9:19 AM Please see Amion for pager number during day hours 7:00am-4:30pm  Agree with above.  Because he is on heparin, we will make his high priority to get surgery tomorrow.  I spoke to his wife, Stanton Kidney, on the phone.  I discussed with the patient the indications and risks of gall bladder surgery.  The primary risks of gall bladder surgery include, but are not limited to, bleeding, infection, common bile duct injury, and open surgery.  There is also the risk that the patient may have continued symptoms after surgery.  We discussed the typical post-operative recovery course. I tried to answer the patient's questions.  Alphonsa Overall, MD, [facility] Office phone:  [phone]

## 2019-01-26 NOTE — Progress Notes (Signed)
ANTICOAGULATION CONSULT NOTE - Follow Up Consult  Pharmacy Consult for Heparin Indication: atrial fibrillation  Allergies  Allergen Reactions  . Penicillins Rash    Has patient had a PCN reaction causing immediate rash, facial/tongue/throat swelling, SOB or lightheadedness with hypotension: No Has patient had a PCN reaction causing severe rash involving mucus membranes or skin necrosis: No Has patient had a PCN reaction that required hospitalization: No Has patient had a PCN reaction occurring within the last 10 years: No If all of the above answers are "NO", then may proceed with Cephalosporin use.     Patient Measurements: Height: 5\' 10"  (177.8 cm) Weight: 189 lb 6 oz (85.9 kg) IBW/kg (Calculated) : 73 Heparin Dosing Weight:   Vital Signs: Temp: 98.1 F (36.7 C) (12/01 2156) Temp Source: Oral (12/01 2156) BP: 119/67 (12/01 2156) Pulse Rate: 63 (12/01 2156)  Labs: Recent Labs    01/24/19 2149  01/25/19 0214 01/25/19 0500 01/25/19 0828 01/25/19 1003 01/25/19 1252 01/25/19 2256  HGB 11.3*  --   --  9.8*  --   --   --   --   HCT 34.4*  --   --  30.5*  --   --   --   --   PLT 252  --   --  223  --   --   --   --   HEPARINUNFRC  --   --   --   --   --   --  <0.10* <0.10*  CREATININE 1.30*  --   --  1.14  --   --   --   --   TROPONINIHS  --    < > 126*  --  77* 71*  --   --    < > = values in this interval not displayed.    Estimated Creatinine Clearance: 49.8 mL/min (by C-G formula based on SCr of 1.14 mg/dL).   Medications:  Infusions:  . sodium chloride 100 mL/hr at 01/26/19 0051  . cefTRIAXone (ROCEPHIN)  IV 2 g (01/25/19 1125)  . heparin 1,400 Units/hr (01/26/19 0302)  . metronidazole 500 mg (01/26/19 0051)    Assessment: Patient with heparin level below goal.  No heparin issues per RN.  Goal of Therapy:  Heparin level 0.3-0.7 units/ml Monitor platelets by anticoagulation protocol: Yes   Plan:  Increase heparin to 1400 units/hr Recheck level at  [address] [place] 01/26/2019,3:03 AM

## 2019-01-27 ENCOUNTER — Inpatient Hospital Stay (HOSPITAL_COMMUNITY): Payer: Medicare Other | Admitting: Certified Registered"

## 2019-01-27 ENCOUNTER — Encounter (HOSPITAL_COMMUNITY): Payer: Self-pay | Admitting: *Deleted

## 2019-01-27 ENCOUNTER — Inpatient Hospital Stay (HOSPITAL_COMMUNITY): Payer: Medicare Other

## 2019-01-27 ENCOUNTER — Encounter (HOSPITAL_COMMUNITY)
Admission: EM | Disposition: A | Discharge status: Home / Self Care | Payer: Self-pay | Source: Home / Self Care | Attending: Internal Medicine

## 2019-01-27 HISTORY — PX: CHOLECYSTECTOMY: SHX55

## 2019-01-27 LAB — CBC
HCT: 28.9 % — ABNORMAL LOW (ref 39.0–52.0)
Hemoglobin: 9.2 g/dL — ABNORMAL LOW (ref 13.0–17.0)
MCH: 31.7 pg (ref 26.0–34.0)
MCHC: 31.8 g/dL (ref 30.0–36.0)
MCV: 99.7 fL (ref 80.0–100.0)
Platelets: 247 K/uL (ref 150–400)
RBC: 2.9 MIL/uL — ABNORMAL LOW (ref 4.22–5.81)
RDW: 12.9 % (ref 11.5–15.5)
WBC: 6.9 K/uL (ref 4.0–10.5)
nRBC: 0 % (ref 0.0–0.2)

## 2019-01-27 LAB — GLUCOSE, CAPILLARY
Glucose-Capillary: 100 mg/dL — ABNORMAL HIGH (ref 70–99)
Glucose-Capillary: 126 mg/dL — ABNORMAL HIGH (ref 70–99)
Glucose-Capillary: 143 mg/dL — ABNORMAL HIGH (ref 70–99)
Glucose-Capillary: 90 mg/dL (ref 70–99)
Glucose-Capillary: 91 mg/dL (ref 70–99)
Glucose-Capillary: 93 mg/dL (ref 70–99)
Glucose-Capillary: 94 mg/dL (ref 70–99)

## 2019-01-27 LAB — HEPARIN LEVEL (UNFRACTIONATED): Heparin Unfractionated: <0.1 [IU]/mL — ABNORMAL LOW (ref 0.30–0.70)

## 2019-01-27 LAB — MRSA PCR SCREENING: MRSA by PCR: NEGATIVE

## 2019-01-27 SURGERY — LAPAROSCOPIC CHOLECYSTECTOMY
Anesthesia: General | Site: Abdomen

## 2019-01-27 MED ORDER — BUPIVACAINE-EPINEPHRINE 0.25% -1:200000 IJ SOLN
INTRAMUSCULAR | Status: AC
  Filled 2019-01-27: qty 1

## 2019-01-27 MED ORDER — PROPOFOL 10 MG/ML IV BOLUS
INTRAVENOUS | Status: DC | PRN
  Administered 2019-01-27: 50 mg via INTRAVENOUS
  Administered 2019-01-27: 80 mg via INTRAVENOUS

## 2019-01-27 MED ORDER — ESMOLOL HCL 100 MG/10ML IV SOLN
INTRAVENOUS | Status: DC | PRN
  Administered 2019-01-27 (×2): 20 mg via INTRAVENOUS

## 2019-01-27 MED ORDER — FENTANYL CITRATE (PF) 100 MCG/2ML IJ SOLN
INTRAMUSCULAR | Status: AC
  Filled 2019-01-27: qty 2

## 2019-01-27 MED ORDER — MIDAZOLAM HCL 2 MG/2ML IJ SOLN
INTRAMUSCULAR | Status: AC
  Filled 2019-01-27: qty 2

## 2019-01-27 MED ORDER — ONDANSETRON HCL 4 MG/2ML IJ SOLN
INTRAMUSCULAR | Status: DC | PRN
  Administered 2019-01-27: 4 mg via INTRAVENOUS

## 2019-01-27 MED ORDER — LACTATED RINGERS IV SOLN
INTRAVENOUS | Status: DC
  Administered 2019-01-27: 14:00:00 via INTRAVENOUS

## 2019-01-27 MED ORDER — PHENYLEPHRINE HCL-NACL 10-0.9 MG/250ML-% IV SOLN
INTRAVENOUS | Status: AC
  Filled 2019-01-27: qty 250

## 2019-01-27 MED ORDER — FENTANYL CITRATE (PF) 100 MCG/2ML IJ SOLN
INTRAMUSCULAR | Status: DC | PRN
  Administered 2019-01-27: 50 ug via INTRAVENOUS
  Administered 2019-01-27: 25 ug via INTRAVENOUS
  Administered 2019-01-27: 50 ug via INTRAVENOUS
  Administered 2019-01-27: 25 ug via INTRAVENOUS
  Administered 2019-01-27: 50 ug via INTRAVENOUS

## 2019-01-27 MED ORDER — FENTANYL CITRATE (PF) 100 MCG/2ML IJ SOLN
25.0000 ug | INTRAMUSCULAR | Status: DC | PRN
  Administered 2019-01-27: 50 ug via INTRAVENOUS

## 2019-01-27 MED ORDER — OXYCODONE HCL 5 MG PO TABS: 5.0000 mg | ORAL_TABLET | Freq: Once | ORAL | Status: DC | PRN

## 2019-01-27 MED ORDER — PROPOFOL 10 MG/ML IV BOLUS
INTRAVENOUS | Status: AC
  Filled 2019-01-27: qty 20

## 2019-01-27 MED ORDER — SUGAMMADEX SODIUM 200 MG/2ML IV SOLN
INTRAVENOUS | Status: DC | PRN
  Administered 2019-01-27: 200 mg via INTRAVENOUS

## 2019-01-27 MED ORDER — LIDOCAINE 2% (20 MG/ML) 5 ML SYRINGE
INTRAMUSCULAR | Status: DC | PRN
  Administered 2019-01-27: 60 mg via INTRAVENOUS

## 2019-01-27 MED ORDER — DEXAMETHASONE SODIUM PHOSPHATE 10 MG/ML IJ SOLN
INTRAMUSCULAR | Status: DC | PRN
  Administered 2019-01-27: 10 mg via INTRAVENOUS

## 2019-01-27 MED ORDER — 0.9 % SODIUM CHLORIDE (POUR BTL) OPTIME
TOPICAL | Status: DC | PRN
  Administered 2019-01-27: 1000 mL

## 2019-01-27 MED ORDER — LACTATED RINGERS IV SOLN
INTRAVENOUS | Status: AC | PRN
  Administered 2019-01-27: 2000 mL

## 2019-01-27 MED ORDER — OXYCODONE HCL 5 MG/5ML PO SOLN: 5.0000 mg | Freq: Once | ORAL | Status: DC | PRN

## 2019-01-27 MED ORDER — BUPIVACAINE-EPINEPHRINE 0.25% -1:200000 IJ SOLN
INTRAMUSCULAR | Status: DC | PRN
  Administered 2019-01-27: 25 mL

## 2019-01-27 MED ORDER — MORPHINE SULFATE (PF) 2 MG/ML IV SOLN
1.0000 mg | INTRAVENOUS | Status: DC | PRN
  Administered 2019-01-27: 2 mg via INTRAVENOUS
  Filled 2019-01-27: qty 1

## 2019-01-27 MED ORDER — ROCURONIUM BROMIDE 50 MG/5ML IV SOSY
PREFILLED_SYRINGE | INTRAVENOUS | Status: DC | PRN
  Administered 2019-01-27: 10 mg via INTRAVENOUS
  Administered 2019-01-27: 50 mg via INTRAVENOUS

## 2019-01-27 MED ORDER — ONDANSETRON HCL 4 MG/2ML IJ SOLN: 4.0000 mg | Freq: Once | INTRAMUSCULAR | Status: DC | PRN

## 2019-01-27 SURGICAL SUPPLY — 44 items
APPLIER CLIP 5 13 M/L LIGAMAX5 (MISCELLANEOUS)
APPLIER CLIP ROT 10 11.4 M/L (STAPLE) ×3
CABLE HIGH FREQUENCY MONO STRZ (ELECTRODE) ×3 IMPLANT
CHLORAPREP W/TINT 26 (MISCELLANEOUS) ×3 IMPLANT
CHOLANGIOGRAM CATH TAUT (CATHETERS) ×3 IMPLANT
CLIP APPLIE 5 13 M/L LIGAMAX5 (MISCELLANEOUS) IMPLANT
CLIP APPLIE ROT 10 11.4 M/L (STAPLE) ×1 IMPLANT
CLOSURE WOUND 1/4X4 (GAUZE/BANDAGES/DRESSINGS)
COVER MAYO STAND STRL (DRAPES) ×3 IMPLANT
COVER SURGICAL LIGHT HANDLE (MISCELLANEOUS) ×3 IMPLANT
COVER WAND RF STERILE (DRAPES) IMPLANT
DECANTER SPIKE VIAL GLASS SM (MISCELLANEOUS) ×3 IMPLANT
DERMABOND ADVANCED (GAUZE/BANDAGES/DRESSINGS) ×2
DERMABOND ADVANCED .7 DNX12 (GAUZE/BANDAGES/DRESSINGS) ×1 IMPLANT
DRAPE C-ARM 42X120 X-RAY (DRAPES) IMPLANT
ELECT REM PT RETURN 15FT ADLT (MISCELLANEOUS) ×3 IMPLANT
ENDOLOOP SUT PDS II  0 18 (SUTURE) ×4
ENDOLOOP SUT PDS II 0 18 (SUTURE) ×2 IMPLANT
GLOVE SURG SYN 7.5  E (GLOVE) ×2
GLOVE SURG SYN 7.5 E (GLOVE) ×1 IMPLANT
GOWN STRL REUS W/TWL XL LVL3 (GOWN DISPOSABLE) ×9 IMPLANT
HEMOSTAT SURGICEL 4X8 (HEMOSTASIS) IMPLANT
IV CATH 14GX2 1/4 (CATHETERS) ×3 IMPLANT
IV SET EXTENSION CATH 6 NF (IV SETS) ×3 IMPLANT
KIT BASIN OR (CUSTOM PROCEDURE TRAY) ×3 IMPLANT
KIT TURNOVER KIT A (KITS) IMPLANT
PENCIL SMOKE EVACUATOR (MISCELLANEOUS) IMPLANT
POUCH RETRIEVAL ECOSAC 10 (ENDOMECHANICALS) ×1 IMPLANT
POUCH RETRIEVAL ECOSAC 10MM (ENDOMECHANICALS) ×2
SCISSORS LAP 5X35 DISP (ENDOMECHANICALS) ×3 IMPLANT
SET IRRIG TUBING LAPAROSCOPIC (IRRIGATION / IRRIGATOR) ×3 IMPLANT
SET TUBE SMOKE EVAC HIGH FLOW (TUBING) ×3 IMPLANT
SLEEVE ADV FIXATION 5X100MM (TROCAR) ×3 IMPLANT
STOPCOCK 4 WAY LG BORE MALE ST (IV SETS) ×3 IMPLANT
STRIP CLOSURE SKIN 1/4X4 (GAUZE/BANDAGES/DRESSINGS) IMPLANT
SUT MNCRL AB 4-0 PS2 18 (SUTURE) ×3 IMPLANT
SUT VICRYL 0 UR6 27IN ABS (SUTURE) ×3 IMPLANT
SYR 10ML ECCENTRIC (SYRINGE) ×3 IMPLANT
TOWEL OR 17X26 10 PK STRL BLUE (TOWEL DISPOSABLE) ×3 IMPLANT
TOWEL OR NON WOVEN STRL DISP B (DISPOSABLE) ×3 IMPLANT
TRAY LAPAROSCOPIC (CUSTOM PROCEDURE TRAY) ×3 IMPLANT
TROCAR ADV FIXATION 11X100MM (TROCAR) ×3 IMPLANT
TROCAR ADV FIXATION 5X100MM (TROCAR) ×3 IMPLANT
TROCAR XCEL BLUNT TIP 100MML (ENDOMECHANICALS) ×3 IMPLANT

## 2019-01-27 NOTE — Progress Notes (Signed)
PROGRESS NOTE  Andre Walker  DOB: Jun 23, 1932  PCP: Rita Ohara, MD TWS:[id number]  DOA: 01/24/2019  LOS: 2 days   Chief Complaint  Patient presents with  . Weakness   Brief narrative: Andre Walker is a 85 y.o. male with medical history significant of DM2, BPH, melanoma. Patient started having nausea, vomiting and malaise last week on Thanksgiving day before any meal.  2 days later, 11/28, he went to urgent care and was told to have UTI.  It is unclear if he was started on the antibiotics. 11/30, he returned back to urgent care when the symptoms did not improve and was ultimately sent to the ED. In the ED, patient was in A flutter, rate controlled. CT scan of abdomen showed gallbladder wall thickening with gallstones and pericholecystic fluid c/w acute cholecystitis. Labs showed Alk 622, AST 87, ALT 137, t bili 2.4, lipase level over 800, creatinine up at 1.3 Patient was started on IV antibiotics, heparin drip, admitted to hospitalist service and consulted with general surgery.  Subjective: Patient was seen and examined this morning.  Pleasant, elderly Caucasian male.  Not in distress.  He was waiting for surgery at that time.  Underwent lap chole this afternoon. Patient remains in A. fib/flutter rate controlled.  Assessment/Plan: Acute cholecystitis  -Presented with nausea, vomiting.  -CT scan finding as above. -Liver enzyme level elevated.  Improving. -General surgery consult appreciated. -Patient underwent laparoscopic cholecystectomy with intraoperative cholangiogram today.  Mild pancreatitis -Lipase level was elevated to 800s.  Currently on conservative management.  Labs are improving.   -Symptoms improving.  Repeat lipase tomorrow.  Mild AKI -Creatinine elevated to 1.3 from normal. -Monitor with IV hydration.  Lisinopril remains on hold.  New onset atrial flutter/A. fib -Self rate controlled.  Classic flutter valves seen in EKG. -CHADSVASC 3 score (age, DM). Patient  was kept on heparin drip for anticoagulation.  However per pharmacy, patient is having undetectable heparin level and blood work, may have underlying Antithrombin 3 activity.  Unable to start on any other anticoagulation at this time because of surgery. -Continues to remain in A. Fib. -If and when okay with surgery, will start the patient on Eliquis. -Patient's wife at bedside states that she sees cardiologist Dr. Martinique as an outpatient and wants to check with him as an outpatient to see if Eliquis is needed. -Echocardiogram showed EF of 50 to 55%.  Diabetes mellitus Hypoglycemia -Metformin on hold.  Patient reports A1c of 6.2.   -Continue sliding scale insulin with Accu-Cheks.  Hyperlipidemia - continue statin.  Mobility: Encourage ambulation.  Patient was independent previously. Diet: Diet postop per surgery Fluid: Normal saline at 100 mL/h DVT prophylaxis:  I will order for SCDs. Code Status:   Code Status: Full Code  Family Communication:  Discussed with wife at bedside. Expected Discharge:  1 to 2 days to home most likely  Consultants:  General surgery  Procedures:  None  Antimicrobials: Anti-infectives (From admission, onward)   Start     Dose/Rate Route Frequency Ordered Stop   01/25/19 1000  [MAR Hold]  cefTRIAXone (ROCEPHIN) 2 g in sodium chloride 0.9 % 100 mL IVPB     (MAR Hold since Thu 01/27/2019 at 1344.Hold Reason: Transfer to a Procedural area.)   2 g 200 mL/hr over 30 Minutes Intravenous Every 24 hours 01/25/19 0344     01/25/19 0800  [MAR Hold]  metroNIDAZOLE (FLAGYL) IVPB 500 mg     (MAR Hold since Thu 01/27/2019 at 1344.Hold Reason: Transfer  to a Procedural area.)   500 mg 100 mL/hr over 60 Minutes Intravenous Every 8 hours 01/25/19 0344     01/25/19 0145  ceFEPIme (MAXIPIME) 2 g in sodium chloride 0.9 % 100 mL IVPB     2 g 200 mL/hr over 30 Minutes Intravenous  Once 01/25/19 0143 01/25/19 0257   01/25/19 0145  metroNIDAZOLE (FLAGYL) IVPB 500 mg     500 mg  100 mL/hr over 60 Minutes Intravenous  Once 01/25/19 0143 01/25/19 0402      Diet Order            Diet NPO time specified Except for: Sips with Meds  Diet effective midnight              Infusions:  . sodium chloride Stopped (01/27/19 1328)  . [MAR Hold] cefTRIAXone (ROCEPHIN)  IV Stopped (01/27/19 1012)  . lactated ringers 50 mL/hr at 01/27/19 1347  . lactated ringers    . [MAR Hold] metronidazole 0 mg (01/27/19 0900)  . phenylephrine      Scheduled Meds: . fentaNYL      . [MAR Hold] insulin aspart  0-9 Units Subcutaneous Q4H  . [MAR Hold] simvastatin  20 mg Oral QHS  . [MAR Hold] terazosin  5 mg Oral QHS    PRN meds: 0.9 % irrigation (POUR BTL), [MAR Hold] acetaminophen **OR** [MAR Hold] acetaminophen, bupivacaine-EPINEPHrine, fentaNYL (SUBLIMAZE) injection, lactated ringers, [MAR Hold] ondansetron **OR** [MAR Hold] ondansetron (ZOFRAN) IV, ondansetron (ZOFRAN) IV, oxyCODONE **OR** oxyCODONE   Objective: Vitals:   01/27/19 1618 01/27/19 1630  BP: (!) 142/65 135/66  Pulse: 79 71  Resp: 15 18  Temp: 97.7 F (36.5 C)   SpO2: 99% 96%    Intake/Output Summary (Last 24 hours) at 01/27/2019 1643 Last data filed at 01/27/2019 1619 Gross per 24 hour  Intake 3473.74 ml  Output 600 ml  Net 2873.74 ml   Filed Weights   01/25/19 0215 01/25/19 0604  Weight: 87.5 kg 85.9 kg   Weight change:  Body mass index is 27.17 kg/m.   Physical Exam prior to surgery today: General exam: Appears calm and comfortable.  Skin: No rashes, lesions or ulcers. HEENT: Atraumatic, normocephalic, supple neck, no obvious bleeding Lungs: Clear to auscultation bilaterally CVS: Irregular rhythm, controlled rate, no murmur GI/Abd soft, nondistended, tenderness present in right upper quadrant and epigastric area.  Bowel sound present CNS: Awake, alert, oriented x3 Psychiatry: Mood appropriate Extremities: No pedal edema, no calf tenderness  Data Review: I have personally reviewed the  laboratory data and studies available.  Recent Labs  Lab 01/24/19 2149 01/25/19 0500 01/26/19 0334 01/27/19 0402  WBC 12.2* 11.6* 8.6 6.9  NEUTROABS 10.8*  --   --   --   HGB 11.3* 9.8* 9.8* 9.2*  HCT 34.4* 30.5* 31.3* 28.9*  MCV 98.6 100.3* 101.3* 99.7  PLT 252 223 232 247   Recent Labs  Lab 01/24/19 2149 01/25/19 0500 01/26/19 0334  NA 135 138 138  K 3.4* 3.7 3.6  CL 104 107 109  CO2 20* 22 20*  GLUCOSE 191* 152* 83  BUN 39* 36* 31*  CREATININE 1.30* 1.14 0.98  CALCIUM 8.3* 8.0* 7.8*   Recent Labs  Lab 01/24/19 2149 01/25/19 0500 01/26/19 0334  AST 87* 50* 31  ALT 137* 109* 85*  ALKPHOS 622* 459* 353*  BILITOT 2.4* 1.3* 1.2  PROT 6.2* 5.6* 5.6*  ALBUMIN 3.2* 2.7* 2.7*    Terrilee Croak, MD  [facility] 01/27/2019

## 2019-01-27 NOTE — Anesthesia Preprocedure Evaluation (Signed)
Anesthesia Evaluation  Patient identified by MRN, date of birth, ID band Patient awake    Reviewed: Allergy & Precautions, NPO status , Patient's Chart, lab work & pertinent test results  Airway Mallampati: II  TM Distance: >3 FB Neck ROM: Full    Dental  (+) Teeth Intact, Dental Advisory Given   Pulmonary    breath sounds clear to auscultation       Cardiovascular  Rhythm:Irregular Rate:Normal     Neuro/Psych    GI/Hepatic   Endo/Other  diabetes  Renal/GU      Musculoskeletal   Abdominal   Peds  Hematology   Anesthesia Other Findings   Reproductive/Obstetrics                             Anesthesia Physical Anesthesia Plan  ASA: III  Anesthesia Plan: General   Post-op Pain Management:    Induction: Intravenous  PONV Risk Score and Plan: Ondansetron and Dexamethasone  Airway Management Planned: Oral ETT  Additional Equipment:   Intra-op Plan:   Post-operative Plan: Extubation in OR  Informed Consent: I have reviewed the patients History and Physical, chart, labs and discussed the procedure including the risks, benefits and alternatives for the proposed anesthesia with the patient or authorized representative who has indicated his/her understanding and acceptance.     Dental advisory given  Plan Discussed with: CRNA and Anesthesiologist  Anesthesia Plan Comments:         Anesthesia Quick Evaluation

## 2019-01-27 NOTE — Anesthesia Postprocedure Evaluation (Signed)
Anesthesia Post Note  Patient: Andre Walker  Procedure(s) Performed: LAPAROSCOPIC CHOLECYSTECTOMY WITH INTRAOPEREATIVE CHOLANGIOGRAM (N/A Abdomen)     Patient location during evaluation: PACU Anesthesia Type: General Level of consciousness: awake and alert Pain management: pain level controlled Vital Signs Assessment: post-procedure vital signs reviewed and stable Respiratory status: spontaneous breathing, nonlabored ventilation, respiratory function stable and patient connected to nasal cannula oxygen Cardiovascular status: blood pressure returned to baseline and stable Postop Assessment: no apparent nausea or vomiting Anesthetic complications: no    Last Vitals:  Vitals:   01/27/19 1700 01/27/19 1801  BP: 133/67 (!) 143/73  Pulse: 74 78  Resp: 15   Temp:  36.6 C  SpO2: 95% 96%    Last Pain:  Vitals:   01/27/19 1801  TempSrc: Oral  PainSc:                  Hydee Fleece COKER

## 2019-01-27 NOTE — Op Note (Addendum)
01/27/2019  4:13 PM  PATIENT:  Andre Walker, 85 y.o., male, MRN: [id number]  PREOP DIAGNOSIS:  Cholecystitis  POSTOP DIAGNOSIS:   Acute and chronic cholecystitis, cholelithiasis, common bile duct stones  PROCEDURE:   Procedure(s):  LAPAROSCOPIC CHOLECYSTECTOMY WITH INTRAOPEREATIVE CHOLANGIOGRAM  SURGEON:   Alphonsa Overall, M.D.  Terrence DupontClyda Greener, M.D.  ANESTHESIA:   general  Anesthesiologist: Roberts Gaudy, MD CRNA: West Pugh, CRNA  General  ASA: 3 and emergent  EBL:  50  ml  BLOOD ADMINISTERED: none  DRAINS: none   LOCAL MEDICATIONS USED:   30 cc of 1/4% marcaine  SPECIMEN:   GAll bladder  COUNTS CORRECT:  YES  INDICATIONS FOR PROCEDURE:  Andre Walker is a 85 y.o. (DOB: 08-11-1932) white male whose primary care physician is Rita Ohara, MD and comes for cholecystectomy.   He is hospitalized for new onset A fib and cholecystitis and pancreatitis.   The indications and risks of the gall bladder surgery were explained to the patient.  The risks include, but are not limited to, infection, bleeding, common bile duct injury and open surgery.  SURGERY:  The patient was taken to OR room #[facility] at [facility].  The abdomen was prepped with chloroprep.  The patient was Rocephin and Flagyl prior to the beginning of the operation.   A time out was held and the surgical checklist run.   An infraumbilical incision was made into the abdominal cavity.  A 12 mm Hasson trocar was inserted into the abdominal cavity through the infraumbilical incision and secured with a 0 Vicryl suture.  Three additional trocars were inserted: a 10 mm trocar in the sub-xiphoid location, a 5 mm trocar in the right mid subcostal area, and a 5 mm trocar in the right lateral subcostal area.   The abdomen was explored and the liver, stomach, and bowel that could be seen were unremarkable.   The gall bladder was acutely and chronically inflamed and partially encased in omentum.  The omentum was  stripped away.  The gall bladder was decompressed.   I grasped the gall bladder and rotated it cephalad.  Disssection was carried down to the gall bladder/cystic duct junction and the cystic duct isolated.  A clip was placed on the gall bladder side of the cystic duct.   An intra-operative cholangiogram was shot.   The intra-operative cholangiogram was shot using a cut off Taut catheter placed through a 14 gauge angiocath in the RUQ.  The Taut catheter was inserted in the cut cystic duct and secured with an endoclip.  A cholangiogram was shot with 10 cc of 1/2 strength Isoview.  Using fluoroscopy, the cholangiogram showed the flow of contrast into the common bile duct, up the hepatic radicals, and bairly into the duodenum.  There is a suggestion of at least one, if not two, distal common duct stones.   The Taut catheter was removed.  The cystic duct was tripley endoclipped and the cystic artery was identified and clipped x one and double endolooped with PDS.  The gall bladder was bluntly and sharpley dissected from the gall bladder bed.   After the gall bladder was removed from the liver, the gall bladder bed and Tri[place] of [place] were inspected.  There was no bleeding or bile leak.  The gall bladder was placed in a Ecco Sac bag and delivered through the umbilicus.  The abdomen was irrigated with 2,000 cc saline.   The trocars were then removed.  I  infiltrated 30 cc of 1/4% Marcaine into the incisions.  The umbilical port closed with a 0 Vicryl suture and the skin closed with 4-0 Monocryl.  The skin was painted with DermaBond.  The patient's sponge and needle count were correct.  The patient was transported to the RR in good condition.  Alphonsa Overall, MD, [facility] Surgery Pager: [phone] Office phone:  [phone]

## 2019-01-27 NOTE — Progress Notes (Signed)
ANTICOAGULATION CONSULT NOTE - Follow Up Consult  Pharmacy Consult for Heparin Indication: atrial fibrillation  Allergies  Allergen Reactions  . Penicillins Rash    Has patient had a PCN reaction causing immediate rash, facial/tongue/throat swelling, SOB or lightheadedness with hypotension: No Has patient had a PCN reaction causing severe rash involving mucus membranes or skin necrosis: No Has patient had a PCN reaction that required hospitalization: No Has patient had a PCN reaction occurring within the last 10 years: No If all of the above answers are "NO", then may proceed with Cephalosporin use.     Patient Measurements: Height: 5\' 10"  (177.8 cm) Weight: 189 lb 6 oz (85.9 kg) IBW/kg (Calculated) : 73 Heparin Dosing Weight:   Vital Signs: Temp: 97.7 F (36.5 C) (12/03 0445) Temp Source: Oral (12/03 0445) BP: 135/78 (12/03 0445) Pulse Rate: 68 (12/03 0445)  Labs: Recent Labs    01/24/19 2149  01/25/19 0214 01/25/19 0500 01/25/19 0828 01/25/19 1003  01/25/19 2256 01/26/19 0334 01/26/19 1136 01/26/19 2240 01/27/19 0402  HGB 11.3*  --   --  9.8*  --   --   --   --  9.8*  --   --  9.2*  HCT 34.4*  --   --  30.5*  --   --   --   --  31.3*  --   --  28.9*  PLT 252  --   --  223  --   --   --   --  232  --   --  247  HEPARINUNFRC  --   --   --   --   --   --    < > <0.10*  --  <0.10* <0.10*  --   CREATININE 1.30*  --   --  1.14  --   --   --   --  0.98  --   --   --   TROPONINIHS  --    < > 126*  --  77* 71*  --   --   --   --   --   --    < > = values in this interval not displayed.    Estimated Creatinine Clearance: 57.9 mL/min (by C-G formula based on SCr of 0.98 mg/dL).   Medications:  Infusions:  . sodium chloride 100 mL/hr at 01/27/19 0004  . cefTRIAXone (ROCEPHIN)  IV 2 g (01/26/19 1121)  . metronidazole 500 mg (01/27/19 0007)    Assessment: Patient with low heparin level.  No heparin issues per RN.  Heparin turned off at 0000.     Goal of Therapy:   Heparin level 0.3-0.7 units/ml Monitor platelets by anticoagulation protocol: Yes   Plan:  Will follow up with coag plan after procedure  Nani Skillern Crowford 01/27/2019,6:14 AM

## 2019-01-27 NOTE — Progress Notes (Signed)
Per Dr. Lucia Gaskins, do not resume heparin drip tonight s/p lap cholecystectomy.  Patient will have ERCP on 12/4 and timing for when anticoag. can be resumed will be determined after procedure.  Dia Sitter, PharmD, BCPS 01/27/2019 6:32 PM

## 2019-01-27 NOTE — Transfer of Care (Signed)
Immediate Anesthesia Transfer of Care Note  Patient: Andre Walker  Procedure(s) Performed: LAPAROSCOPIC CHOLECYSTECTOMY WITH INTRAOPEREATIVE CHOLANGIOGRAM (N/A Abdomen)  Patient Location: PACU  Anesthesia Type:General  Level of Consciousness: awake, alert  and patient cooperative  Airway & Oxygen Therapy: Patient Spontanous Breathing and Patient connected to face mask oxygen  Post-op Assessment: Report given to RN and Post -op Vital signs reviewed and stable  Post vital signs: Reviewed and stable  Last Vitals:  Vitals Value Taken Time  BP 142/65 01/27/19 1618  Temp    Pulse 81 01/27/19 1619  Resp 19 01/27/19 1619  SpO2 95 % 01/27/19 1619  Vitals shown include unvalidated device data.  Last Pain:  Vitals:   01/27/19 1343  TempSrc: Oral  PainSc:       Patients Stated Pain Goal: 3 ([id number])  Complications: No apparent anesthesia complications

## 2019-01-27 NOTE — Anesthesia Procedure Notes (Signed)
Procedure Name: Intubation Date/Time: 01/27/2019 2:38 PM Performed by: West Pugh, CRNA Pre-anesthesia Checklist: Patient identified, Emergency Drugs available, Suction available, Patient being monitored and Timeout performed Patient Re-evaluated:Patient Re-evaluated prior to induction Oxygen Delivery Method: Circle system utilized Preoxygenation: Pre-oxygenation with 100% oxygen Induction Type: IV induction Ventilation: Two handed mask ventilation required Laryngoscope Size: Mac and 3 Grade View: Grade I Tube type: Oral Tube size: 7.5 mm Number of attempts: 1 Airway Equipment and Method: Stylet Placement Confirmation: ETT inserted through vocal cords under direct vision,  positive ETCO2,  CO2 detector and breath sounds checked- equal and bilateral Secured at: 21 cm Tube secured with: Tape Dental Injury: Teeth and Oropharynx as per pre-operative assessment

## 2019-01-27 NOTE — Progress Notes (Addendum)
Patient ID: Andre Walker, male   DOB: 03/24/1932, 85 y.o.   MRN: [id number] No new complaints today.  Ready for surgery today.  Heparin was turned off overnight for surgery today.  Patient is NPO.  No new questions.  Wife in the room and discussed that Dr. Lucia Gaskins will call her when we are done.  Andre Walker 11:43 AM 01/27/2019  Agree with above.  Andre Overall, MD, [facility] Surgery Office phone:  [phone]

## 2019-01-28 ENCOUNTER — Inpatient Hospital Stay (HOSPITAL_COMMUNITY): Payer: Medicare Other

## 2019-01-28 ENCOUNTER — Encounter (HOSPITAL_COMMUNITY): Payer: Self-pay | Admitting: Surgery

## 2019-01-28 ENCOUNTER — Encounter (HOSPITAL_COMMUNITY)
Admission: EM | Disposition: A | Discharge status: Home / Self Care | Payer: Self-pay | Source: Home / Self Care | Attending: Internal Medicine

## 2019-01-28 ENCOUNTER — Inpatient Hospital Stay (HOSPITAL_COMMUNITY): Payer: Medicare Other | Admitting: Certified Registered Nurse Anesthetist

## 2019-01-28 HISTORY — PX: SPHINCTEROTOMY: SHX5544

## 2019-01-28 HISTORY — PX: REMOVAL OF STONES: SHX5545

## 2019-01-28 HISTORY — PX: BILIARY STENT PLACEMENT: SHX5538

## 2019-01-28 HISTORY — PX: ERCP: SHX5425

## 2019-01-28 LAB — CBC WITH DIFFERENTIAL/PLATELET
Abs Immature Granulocytes: 0.09 10*3/uL — ABNORMAL HIGH (ref 0.00–0.07)
Basophils Absolute: 0 10*3/uL (ref 0.0–0.1)
Basophils Relative: 0 %
Eosinophils Absolute: 0 10*3/uL (ref 0.0–0.5)
Eosinophils Relative: 0 %
HCT: 33.3 % — ABNORMAL LOW (ref 39.0–52.0)
Hemoglobin: 10.7 g/dL — ABNORMAL LOW (ref 13.0–17.0)
Immature Granulocytes: 1 %
Lymphocytes Relative: 6 %
Lymphs Abs: 0.7 10*3/uL (ref 0.7–4.0)
MCH: 32.3 pg (ref 26.0–34.0)
MCHC: 32.1 g/dL (ref 30.0–36.0)
MCV: 100.6 fL — ABNORMAL HIGH (ref 80.0–100.0)
Monocytes Absolute: 0.5 10*3/uL (ref 0.1–1.0)
Monocytes Relative: 4 %
Neutro Abs: 10.3 10*3/uL — ABNORMAL HIGH (ref 1.7–7.7)
Neutrophils Relative %: 89 %
Platelets: 292 10*3/uL (ref 150–400)
RBC: 3.31 MIL/uL — ABNORMAL LOW (ref 4.22–5.81)
RDW: 12.6 % (ref 11.5–15.5)
WBC: 11.6 10*3/uL — ABNORMAL HIGH (ref 4.0–10.5)
nRBC: 0 % (ref 0.0–0.2)

## 2019-01-28 LAB — COMPREHENSIVE METABOLIC PANEL
ALT: 62 U/L — ABNORMAL HIGH (ref 0–44)
AST: 30 U/L (ref 15–41)
Albumin: 2.6 g/dL — ABNORMAL LOW (ref 3.5–5.0)
Alkaline Phosphatase: 322 U/L — ABNORMAL HIGH (ref 38–126)
Anion gap: 13 (ref 5–15)
BUN: 23 mg/dL (ref 8–23)
CO2: 18 mmol/L — ABNORMAL LOW (ref 22–32)
Calcium: 8.2 mg/dL — ABNORMAL LOW (ref 8.9–10.3)
Chloride: 108 mmol/L (ref 98–111)
Creatinine, Ser: 0.86 mg/dL (ref 0.61–1.24)
GFR calc Af Amer: >60 mL/min (ref >60)
GFR calc non Af Amer: >60 mL/min (ref >60)
Glucose, Bld: 142 mg/dL — ABNORMAL HIGH (ref 70–99)
Potassium: 4.5 mmol/L (ref 3.5–5.1)
Sodium: 139 mmol/L (ref 135–145)
Total Bilirubin: 0.6 mg/dL (ref 0.3–1.2)
Total Protein: 5.6 g/dL — ABNORMAL LOW (ref 6.5–8.1)

## 2019-01-28 LAB — PHOSPHORUS: Phosphorus: 3.7 mg/dL (ref 2.5–4.6)

## 2019-01-28 LAB — GLUCOSE, CAPILLARY
Glucose-Capillary: 121 mg/dL — ABNORMAL HIGH (ref 70–99)
Glucose-Capillary: 132 mg/dL — ABNORMAL HIGH (ref 70–99)
Glucose-Capillary: 156 mg/dL — ABNORMAL HIGH (ref 70–99)
Glucose-Capillary: 164 mg/dL — ABNORMAL HIGH (ref 70–99)
Glucose-Capillary: 192 mg/dL — ABNORMAL HIGH (ref 70–99)
Glucose-Capillary: 199 mg/dL — ABNORMAL HIGH (ref 70–99)

## 2019-01-28 LAB — MAGNESIUM: Magnesium: 1.5 mg/dL — ABNORMAL LOW (ref 1.7–2.4)

## 2019-01-28 LAB — LIPASE, BLOOD: Lipase: 24 U/L (ref 11–51)

## 2019-01-28 SURGERY — ERCP, WITH INTERVENTION IF INDICATED
Anesthesia: General

## 2019-01-28 MED ORDER — SODIUM CHLORIDE 0.9 % IV SOLN: INTRAVENOUS | Status: DC

## 2019-01-28 MED ORDER — INDOMETHACIN 50 MG RE SUPP
RECTAL | Status: AC
  Filled 2019-01-28: qty 2

## 2019-01-28 MED ORDER — FENTANYL CITRATE (PF) 100 MCG/2ML IJ SOLN
INTRAMUSCULAR | Status: DC | PRN
  Administered 2019-01-28: 25 ug via INTRAVENOUS

## 2019-01-28 MED ORDER — DEXAMETHASONE SODIUM PHOSPHATE 10 MG/ML IJ SOLN
INTRAMUSCULAR | Status: DC | PRN
  Administered 2019-01-28: 10 mg via INTRAVENOUS

## 2019-01-28 MED ORDER — SUGAMMADEX SODIUM 200 MG/2ML IV SOLN
INTRAVENOUS | Status: DC | PRN
  Administered 2019-01-28: 200 mg via INTRAVENOUS

## 2019-01-28 MED ORDER — CIPROFLOXACIN IN D5W 400 MG/200ML IV SOLN
400.0000 mg | Freq: Once | INTRAVENOUS | Status: AC
  Administered 2019-01-28: 400 mg via INTRAVENOUS

## 2019-01-28 MED ORDER — PROPOFOL 10 MG/ML IV BOLUS
INTRAVENOUS | Status: AC
  Filled 2019-01-28: qty 20

## 2019-01-28 MED ORDER — FENTANYL CITRATE (PF) 100 MCG/2ML IJ SOLN
INTRAMUSCULAR | Status: AC
  Filled 2019-01-28: qty 2

## 2019-01-28 MED ORDER — GLUCAGON HCL RDNA (DIAGNOSTIC) 1 MG IJ SOLR
INTRAMUSCULAR | Status: AC
  Filled 2019-01-28: qty 1

## 2019-01-28 MED ORDER — INDOMETHACIN 50 MG RE SUPP: 100.0000 mg | Freq: Once | RECTAL | Status: DC

## 2019-01-28 MED ORDER — PROPOFOL 500 MG/50ML IV EMUL
INTRAVENOUS | Status: AC
  Filled 2019-01-28: qty 50

## 2019-01-28 MED ORDER — CIPROFLOXACIN IN D5W 400 MG/200ML IV SOLN
INTRAVENOUS | Status: AC
  Filled 2019-01-28: qty 200

## 2019-01-28 MED ORDER — SODIUM CHLORIDE 0.9 % IV SOLN
INTRAVENOUS | Status: DC | PRN
  Administered 2019-01-28: 30 mL

## 2019-01-28 MED ORDER — LACTATED RINGERS IV SOLN
INTRAVENOUS | Status: DC
  Administered 2019-01-28: 09:00:00 via INTRAVENOUS

## 2019-01-28 MED ORDER — LIDOCAINE 2% (20 MG/ML) 5 ML SYRINGE
INTRAMUSCULAR | Status: DC | PRN
  Administered 2019-01-28: 60 mg via INTRAVENOUS

## 2019-01-28 MED ORDER — GLUCAGON HCL RDNA (DIAGNOSTIC) 1 MG IJ SOLR
INTRAMUSCULAR | Status: DC | PRN
  Administered 2019-01-28: 1 mg via INTRAVENOUS

## 2019-01-28 MED ORDER — ONDANSETRON HCL 4 MG/2ML IJ SOLN
INTRAMUSCULAR | Status: DC | PRN
  Administered 2019-01-28: 4 mg via INTRAVENOUS

## 2019-01-28 MED ORDER — INDOMETHACIN 50 MG RE SUPP
RECTAL | Status: DC | PRN
  Administered 2019-01-28: 100 mg via RECTAL

## 2019-01-28 MED ORDER — ROCURONIUM BROMIDE 50 MG/5ML IV SOSY
PREFILLED_SYRINGE | INTRAVENOUS | Status: DC | PRN
  Administered 2019-01-28: 50 mg via INTRAVENOUS

## 2019-01-28 MED ORDER — PROPOFOL 10 MG/ML IV BOLUS
INTRAVENOUS | Status: DC | PRN
  Administered 2019-01-28: 130 mg via INTRAVENOUS

## 2019-01-28 NOTE — Op Note (Signed)
[facility] Patient Name: Andre Walker Procedure Date: 01/28/2019 MRN: [id number] Attending MD: Arta Silence , MD Date of Birth: 1933/01/28 CSN: [id number] Age: 85 Admit Type: Inpatient Procedure:                ERCP Indications:              Common bile duct stone(s), Filling defect on                            intraoperative cholangiogram Providers:                Arta Silence, MD, Cleda Daub, RN, Lazaro Arms,                            Technician Referring MD:             [facility] (Dr. Lucia Gaskins) Medicines:                General Anesthesia, Cipro 400 mg IV, Indomethacin                            100 mg PR, Glucagon 2 mg IV Complications:            No immediate complications. Estimated Blood Loss:     Estimated blood loss: none. Procedure:                Pre-Anesthesia Assessment:                           - Prior to the procedure, a History and Physical                            was performed, and patient medications and                            allergies were reviewed. The patient's tolerance of                            previous anesthesia was also reviewed. The risks                            and benefits of the procedure and the sedation                            options and risks were discussed with the patient.                            All questions were answered, and informed consent                            was obtained. Prior Anticoagulants: The patient has                            taken heparin, last dose was 4 hours pre-procedure.  ASA Grade Assessment: III - A patient with severe                            systemic disease. After reviewing the risks and                            benefits, the patient was deemed in satisfactory                            condition to undergo the procedure.                           After obtaining informed consent, the scope was                             passed under direct vision. Throughout the                            procedure, the patient's blood pressure, pulse, and                            oxygen saturations were monitored continuously. The                            [id number] Olympus duodenoscope was                            introduced through the mouth, and used to inject                            contrast into and used to inject contrast into the                            bile duct. The ERCP was accomplished without                            difficulty. The patient tolerated the procedure                            well. Scope In: Scope Out: Findings:      A scout film of the abdomen was obtained. Surgical clips, consistent       with a previous cholecystectomy, were seen in the area of the right       upper quadrant of the abdomen. The major papilla was adjacent to a       diverticulum. The major papilla was congested. The bile duct was deeply       cannulated. Contrast was injected. I personally interpreted the bile       duct images. Ductal flow of contrast was adequate. The lower third of       the main bile duct contained filling defect(s) thought to be a stone. A       7 mm biliary sphincterotomy was made with a traction (standard)       sphincterotome using blended current. There was no post-sphincterotomy  bleeding. The biliary tree was swept with a 15 mm balloon and basket       starting at the upper third of the main bile duct, middle third of the       main bile duct and lower third of the main duct. A few stones were       removed. Unclear if small remaining distal stones remained. In light of       this, and suboptimal biliary drainage, I elected to place bile duct       stent.Su One 8.5 Fr by 5 cm plastic stent with a single external flap       and a single internal flap was placed 4.5 cm into the common bile duct.       Bile flowed through the stent. The stent was in good  position. Impression:               - The major papilla was adjacent to a diverticulum.                           - The major papilla appeared congested.                           - A filling defect consistent with a stone was seen                            on the cholangiogram.                           - Choledocholithiasis was found, removed, with                            possible remaining stone degree.                           - A biliary sphincterotomy was performed.                           - The biliary tree was swept.                           - In light of potential distal bile duct stone                            remaining and suboptimal biliary drainange, one                            plastic stent was placed into the common bile duct. Moderate Sedation:      None Recommendation:           - Return patient to hospital ward for ongoing care.                           - Clear liquid diet today.                           - Would advise no anticoagulation if at all  possible for 5 days (risk of post-sphincterotomy                            bleeding). If anticoagulation absolutely necessary,                            would use short-acting agent that can be quickly                            titrated.                           - Continue present medications.                           [facility] will follow. Procedure Code(s):        --- Professional ---                           [phone], Endoscopic retrograde                            cholangiopancreatography (ERCP); with placement of                            endoscopic stent into biliary or pancreatic duct,                            including pre- and post-dilation and guide wire                            passage, when performed, including sphincterotomy,                            when performed, each stent                           [id number], Endoscopic retrograde                             cholangiopancreatography (ERCP); with removal of                            calculi/debris from biliary/pancreatic duct(s) Diagnosis Code(s):        --- Professional ---                           R93.2, Abnormal findings on diagnostic imaging of                            liver and biliary tract                           K80.50, Calculus of bile duct without cholangitis                            or cholecystitis without obstruction  K83.8, Other specified diseases of biliary tract CPT copyright 2019 [employer]. All rights reserved. The codes documented in this report are preliminary and upon coder review may  be revised to meet current compliance requirements. Arta Silence, MD 01/28/2019 11:58:19 AM This report has been signed electronically. Number of Addenda: 0

## 2019-01-28 NOTE — Consult Note (Signed)
[facility] Consultation Note  Referring Provider:  [facility] (Dr. Lucia Gaskins) Primary Care Physician:  Rita Ohara, MD Primary Gastroenterologist:  Dr. Clarene Essex  Reason for Consultation:  Abnormal intraoperative cholangiogram  HPI: Andre Walker is a 85 y.o. male admitted with gallstones pancreatitis.  After clinical resolution of symptoms, he underwent cholecystectomy yesterday.  Intraoperative cholangiogram showed couple of suspected distal bile duct stones.  Patient has some post-operative abdominal soreness, otherwise feels fine.   Past Medical History:  Diagnosis Date  . Actinic keratosis    Dr. Allyson Sabal  . BPH (benign prostatic hyperplasia)    controlled on Hytrin  . Colon polyps    Dr. Watt Climes  . Diabetes mellitus 2004  . ED (erectile dysfunction)   . Hearing loss    high frequency  . Hiatal hernia    small, noted on EGD 10/2010  . Melanoma ([place])    right arm  . Microalbuminuria    on ACEI for microalbuminuria (does NOT have HTN)  . Pure hypercholesterolemia 2006  . Traumatic partial tear of biceps tendon 01/2009   "popeye" injury on right  . Wears glasses   . Wears partial dentures    lower    Past Surgical History:  Procedure Laterality Date  . APPENDECTOMY  1955  . CATARACT EXTRACTION Bilateral R 05/07/15, L 05/21/15   Dr. Katy Fitch  . CHOLECYSTECTOMY N/A 01/27/2019   Procedure: LAPAROSCOPIC CHOLECYSTECTOMY WITH INTRAOPEREATIVE CHOLANGIOGRAM;  Surgeon: Alphonsa Overall, MD;  Location: [facility];  Service: General;  Laterality: N/A;  . COLONOSCOPY  11/09, 10/29/10   Dr. Watt Climes  . ESOPHAGOGASTRODUODENOSCOPY  10/29/10   Dr. Watt Climes  . INGUINAL HERNIA REPAIR Right 1989  . MELANOMA EXCISION  10/08/10   R upper arm, Dr. Hassell Done  . MELANOMA EXCISION  03/16/2012   Procedure: MELANOMA EXCISION;  Surgeon: Pedro Earls, MD;  Location: [facility];  Service: General;  Laterality: Right;  excision of melanoma of right arm  . TOTAL KNEE ARTHROPLASTY  Right 2012  . TOTAL KNEE ARTHROPLASTY Left 2005    Prior to Admission medications   Medication Sig Start Date End Date Taking? Authorizing Provider  acetaminophen (TYLENOL) 325 MG tablet Take 650 mg by mouth every 6 (six) hours as needed for mild pain, moderate pain or headache.    Yes [provider]  aspirin 81 MG tablet Take 81 mg by mouth daily.     Yes [provider]  Calcium Carbonate-Vitamin D (CALCIUM 600+D HIGH POTENCY) 600-400 MG-UNIT per tablet Take 1 tablet by mouth 2 (two) times daily.    Yes [provider]  cephALEXin (KEFLEX) 500 MG capsule Take 500 mg by mouth 4 (four) times daily. 01/23/19  Yes [provider]  Coenzyme Q10 (COQ10) 100 MG CAPS Take 1 capsule by mouth daily.     Yes [provider]  glucosamine-chondroitin 500-400 MG tablet Take 2 tablets by mouth daily.    Yes [provider]  glucose blood test strip 1 each by Other route daily. Use as instructed 12/09/17  Yes Rita Ohara, MD  lisinopril (ZESTRIL) 5 MG tablet Take 1 tablet (5 mg total) by mouth daily. 08/02/18  Yes Rita Ohara, MD  loratadine (CLARITIN) 10 MG tablet Take 10 mg by mouth daily.   Yes [provider]  metFORMIN (GLUCOPHAGE-XR) 500 MG 24 hr tablet TAKE 2 TABLETS BY MOUTH ONCE DAILY WITH BREAKFAST Patient taking differently: Take 1,000 mg by mouth daily with breakfast.  11/02/18  Yes Tomi Bamberger,  Eve, MD  Multiple Vitamins-Minerals (MULTIVITAMIN WITH MINERALS) tablet Take 1 tablet by mouth daily.     Yes [provider]  Omega-3 Fatty Acids (FISH OIL PO) Take 1,600 mg by mouth daily.   Yes [provider]  [facility] DELICA LANCETS [id number] MISC 1 each by Does not apply route daily. 12/09/17  Yes Rita Ohara, MD  simvastatin (ZOCOR) 20 MG tablet Take 1 tablet (20 mg total) by mouth at bedtime. 08/02/18  Yes Rita Ohara, MD  terazosin (HYTRIN) 5 MG capsule Take 1 capsule by mouth at bedtime 01/17/19  Yes Rita Ohara, MD    Current  Facility-Administered Medications  Medication Dose Route Frequency Provider Last Rate Last Dose  . 0.9 %  sodium chloride infusion   Intravenous Continuous Alphonsa Overall, MD 100 mL/hr at 01/28/19 0415    . [MAR Hold] acetaminophen (TYLENOL) tablet 650 mg  650 mg Oral Q6H PRN Alphonsa Overall, MD       Or  . Doug Sou Hold] acetaminophen (TYLENOL) suppository 650 mg  650 mg Rectal Q6H PRN Alphonsa Overall, MD      . Doug Sou Hold] cefTRIAXone (ROCEPHIN) 2 g in sodium chloride 0.9 % 100 mL IVPB  2 g Intravenous Q24H Alphonsa Overall, MD   Stopped at 01/27/19 1012  . ciprofloxacin (CIPRO) IVPB 400 mg  400 mg Intravenous Once Arta Silence, MD      . indomethacin (INDOCIN) 50 MG suppository 100 mg  100 mg Rectal Once Arta Silence, MD      . Doug Sou Hold] insulin aspart (novoLOG) injection 0-9 Units  0-9 Units Subcutaneous Q4H Alphonsa Overall, MD   2 Units at 01/28/19 [phone]  . lactated ringers infusion   Intravenous Continuous Arta Silence, MD 20 mL/hr at 01/28/19 [phone]    . [MAR Hold] metroNIDAZOLE (FLAGYL) IVPB 500 mg  500 mg Intravenous Tenna Child, MD 100 mL/hr at 01/28/19 0748 500 mg at 01/28/19 0748  . [MAR Hold] morphine 2 MG/ML injection 1-3 mg  1-3 mg Intravenous Q2H PRN Alphonsa Overall, MD   2 mg at 01/27/19 2215  . [MAR Hold] ondansetron (ZOFRAN) tablet 4 mg  4 mg Oral Q6H PRN Alphonsa Overall, MD       Or  . Doug Sou Hold] ondansetron [facility]) injection 4 mg  4 mg Intravenous Q6H PRN Alphonsa Overall, MD      . Doug Sou Hold] simvastatin (ZOCOR) tablet 20 mg  20 mg Oral QHS Alphonsa Overall, MD   20 mg at 01/27/19 2217  . [MAR Hold] terazosin (HYTRIN) capsule 5 mg  5 mg Oral QHS Alphonsa Overall, MD   5 mg at 01/27/19 2217    Allergies as of 01/24/2019 - Review Complete 01/24/2019  Allergen Reaction Noted  . Penicillins Rash 08/23/2010    Family History  Problem Relation Age of Onset  . Diabetes Sister   . Diabetes Brother   . Heart disease Brother   . Hyperlipidemia Brother   . Hypertension Brother   . Stroke  Brother   . Diabetes Sister   . Cancer Paternal Uncle   . Heart disease Paternal Uncle     Social History   Socioeconomic History  . Marital status: Married    Spouse name: Not on file  . Number of children: 2  . Years of education: Not on file  . Highest education level: Not on file  Occupational History  . Occupation: Retired (from post office)  Social Needs  . Financial resource strain: Not on file  . Food  insecurity    Worry: Not on file    Inability: Not on file  . Transportation needs    Medical: Not on file    Non-medical: Not on file  Tobacco Use  . Smoking status: Never Smoker  . Smokeless tobacco: Never Used  Substance and Sexual Activity  . Alcohol use: Yes    Alcohol/week: 2.0 - 3.0 standard drinks    Types: 2 - 3 Glasses of wine per week    Comment: 1 glass of wine 1-2 times per month.  . Drug use: No  . Sexual activity: Not Currently    Partners: Female  Lifestyle  . Physical activity    Days per week: Not on file    Minutes per session: Not on file  . Stress: Not on file  Relationships  . Social Herbalist on phone: Not on file    Gets together: Not on file    Attends religious service: Not on file    Active member of club or organization: Not on file    Attends meetings of clubs or organizations: Not on file    Relationship status: Not on file  . Intimate partner violence    Fear of current or ex partner: Not on file    Emotionally abused: Not on file    Physically abused: Not on file    Forced sexual activity: Not on file  Other Topics Concern  . Not on file  Social History Narrative   1 son in [place], 1 son in [place].    Previously did handyman work (very little now).     Moved to [place] 09/2012   3 grandchildren    Review of Systems: As per HPI, all others negative  Physical Exam: Vital signs in last 24 hours: Temp:  [97.4 F (36.3 C)-98 F (36.7 C)] 97.6 F (36.4 C) (12/04 0837) Pulse Rate:   [66-84] 84 (12/04 0837) Resp:  [11-18] 16 (12/04 0837) BP: (132-148)/(63-100) 132/63 (12/04 0837) SpO2:  [95 %-99 %] 95 % (12/04 0837) Last BM Date: 01/25/19 General:   Alert,  Well-developed, well-nourished, pleasant and cooperative in NAD Head:  Normocephalic and atraumatic. Eyes:  Sclera clear, no icterus.   Conjunctiva pink. Ears:  Normal auditory acuity. Nose:  No deformity, discharge,  or lesions. Mouth:  No deformity or lesions.  Oropharynx pink & moist. Neck:  Supple; no masses or thyromegaly. Heart: irregularly irregular Abdomen:  Soft, mild postoperative tenderness, no peritonitis. No masses, hepatosplenomegaly or hernias noted. Normal bowel sounds, without guarding, and without rebound.     Msk:  Symmetrical without gross deformities. Normal posture. Pulses:  Normal pulses noted. Extremities:  Without clubbing or edema. Neurologic:  Alert and  oriented x4; diffusely weak, otherwise grossly normal neurologically. Skin:  Scattered ecchymoses, otherwise intact without significant lesions or rashes. Cervical Nodes:  No significant cervical adenopathy. Psych:  Alert and cooperative. Normal mood and affect.   Lab Results: Recent Labs    01/26/19 0334 01/27/19 0402 01/28/19 0352  WBC 8.6 6.9 11.6*  HGB 9.8* 9.2* 10.7*  HCT 31.3* 28.9* 33.3*  PLT 232 247 292   BMET Recent Labs    01/26/19 0334 01/28/19 0352  NA 138 139  K 3.6 4.5  CL 109 108  CO2 20* 18*  GLUCOSE 83 142*  BUN 31* 23  CREATININE 0.98 0.86  CALCIUM 7.8* 8.2*   LFT Recent Labs    01/28/19 0352  PROT 5.6*  ALBUMIN 2.6*  AST 30  ALT 62*  ALKPHOS 322*  BILITOT 0.6   PT/INR No results for input(s): LABPROT, INR in the last 72 hours.  Studies/Results: Dg Cholangiogram Operative  Result Date: 01/27/2019 CLINICAL DATA:  Cholecystectomy for cholelithiasis and cholecystitis. EXAM: INTRAOPERATIVE CHOLANGIOGRAM TECHNIQUE: Cholangiographic images from the C-arm fluoroscopic device were submitted  for interpretation post-operatively. Please see the procedural report for the amount of contrast and the fluoroscopy time utilized. COMPARISON:  CT of the abdomen and pelvis on 01/24/1999 FINDINGS: Intraoperative imaging with a C-arm demonstrates mildly dilated appearance of the mid to distal common bile duct with at least 2 discrete filling defects identified in the distal common bile duct just above the ampulla. These appear consistent with calculi. Some contrast does flow around these filling defects and into the duodenum. Intrahepatic bile ducts are normal in caliber. IMPRESSION: Findings suggestive of choledocholithiasis with at least 2 discrete filling defects in the distal common bile duct which are not completely occlusive to flow of contrast into the duodenum. Electronically Signed   By: Aletta Edouard M.D.   On: 01/27/2019 17:23    Impression:  1.  GS pancreatitis, clinically resolved. 2.  Gallstones, POD 1 cholecystectomy. 3.  Elevated LFTs, mild 4.  Abnormal intraoperative cholangiogram, bile duct stone(s) seen. 5.  New onset atrial fibrillation.  Plan:  1.  Heparin on hold x 6 hours. 2.  Endoscopic retrograde cholangiopancreatography with anticipated biliary sphincterotomy and bile duct stone extraction. 3.  Risks (up to and including bleeding, infection, perforation, pancreatitis that can be complicated by infected necrosis and death), benefits (removal of stones, alleviating blockage, decreasing risk of cholangitis or choledocholithiasis-related pancreatitis), and alternatives (watchful waiting, percutaneous transhepatic cholangiography) of ERCP were explained to patient/family in detail and patient elects to proceed.   LOS: 3 days   Feras Gardella M  01/28/2019, 9:03 AM  Cell [phone] If no answer or after 5 PM call [phone]

## 2019-01-28 NOTE — Anesthesia Preprocedure Evaluation (Signed)
Anesthesia Evaluation  Patient identified by MRN, date of birth, ID band Patient awake    Reviewed: Allergy & Precautions, NPO status , Patient's Chart, lab work & pertinent test results  Airway Mallampati: II  TM Distance: >3 FB Neck ROM: Full    Dental  (+) Dental Advisory Given, Partial Lower   Pulmonary neg pulmonary ROS,    Pulmonary exam normal breath sounds clear to auscultation       Cardiovascular hypertension, Pt. on medications + Peripheral Vascular Disease  + dysrhythmias Atrial Fibrillation  Rhythm:Irregular Rate:Abnormal     Neuro/Psych negative neurological ROS  negative psych ROS   GI/Hepatic negative GI ROS, Bile duct stone   Endo/Other  diabetes, Type 2, Oral Hypoglycemic AgentsHypothyroidism   Renal/GU      Musculoskeletal negative musculoskeletal ROS (+)   Abdominal   Peds  Hematology negative hematology ROS (+)   Anesthesia Other Findings Day of surgery medications reviewed with the patient.  Reproductive/Obstetrics                             Anesthesia Physical Anesthesia Plan  ASA: III  Anesthesia Plan: General   Post-op Pain Management:    Induction: Intravenous  PONV Risk Score and Plan: 2 and Dexamethasone and Ondansetron  Airway Management Planned: Oral ETT  Additional Equipment:   Intra-op Plan:   Post-operative Plan: Extubation in OR  Informed Consent: I have reviewed the patients History and Physical, chart, labs and discussed the procedure including the risks, benefits and alternatives for the proposed anesthesia with the patient or authorized representative who has indicated his/her understanding and acceptance.     Dental advisory given  Plan Discussed with: CRNA  Anesthesia Plan Comments:         Anesthesia Quick Evaluation

## 2019-01-28 NOTE — Progress Notes (Signed)
While rounding floor. Mary, Pt's wife who was at bedside started talking and was open for a visit. Pt was alert and talking as well. I was Pastorally present with them and offered them space to talk. Stanton Kidney did most of the talking and mentioned their story of how they ended up in surgery and recovery for Mr. Andre Walker. He was talking and mentioned that he was feeling better, but was a little hesitant about doing physical therapy later today. Stanton Kidney said that's because he hasn't eating in a while that He might not take the therapy very well. I reassured them that staff is available to hear their concerns when the time arrives.   I was present to offer empathic listening and ministry of presence. Stanton Kidney stated that they have many people praying for them. Chaplain available as needed.   Chaplain Resident Fidel Levy [phone]

## 2019-01-28 NOTE — Transfer of Care (Signed)
Immediate Anesthesia Transfer of Care Note  Patient: Andre Walker  Procedure(s) Performed: ENDOSCOPIC RETROGRADE CHOLANGIOPANCREATOGRAPHY (ERCP) (N/A )  Patient Location: PACU  Anesthesia Type:General  Level of Consciousness: awake, alert  and oriented  Airway & Oxygen Therapy: Patient Spontanous Breathing and Patient connected to face mask oxygen  Post-op Assessment: Report given to RN and Post -op Vital signs reviewed and stable  Post vital signs: Reviewed and stable  Last Vitals:  Vitals Value Taken Time  BP    Temp    Pulse 87 01/28/19 1205  Resp 23 01/28/19 1205  SpO2 94 % 01/28/19 1205  Vitals shown include unvalidated device data.  Last Pain:  Vitals:   01/28/19 0837  TempSrc: Oral  PainSc: 0-No pain      Patients Stated Pain Goal: 3 ([id number])  Complications: No apparent anesthesia complications

## 2019-01-28 NOTE — Anesthesia Postprocedure Evaluation (Signed)
Anesthesia Post Note  Patient: Andre Walker  Procedure(s) Performed: ENDOSCOPIC RETROGRADE CHOLANGIOPANCREATOGRAPHY (ERCP) (N/A ) SPHINCTEROTOMY BILIARY STENT PLACEMENT (N/A ) REMOVAL OF STONES     Patient location during evaluation: Endoscopy Anesthesia Type: General Level of consciousness: awake and alert Pain management: pain level controlled Vital Signs Assessment: post-procedure vital signs reviewed and stable Respiratory status: spontaneous breathing, nonlabored ventilation, respiratory function stable and patient connected to nasal cannula oxygen Cardiovascular status: blood pressure returned to baseline and stable Postop Assessment: no apparent nausea or vomiting Anesthetic complications: no    Last Vitals:  Vitals:   01/28/19 1433 01/28/19 1500  BP: (!) 152/92 (!) 148/77  Pulse: 64 66  Resp: 17 16  Temp: 36.9 C (!) 36.4 C  SpO2: 99% 98%    Last Pain:  Vitals:   01/28/19 1500  TempSrc: Oral  PainSc:                  Catalina Gravel

## 2019-01-28 NOTE — Anesthesia Procedure Notes (Signed)
Procedure Name: Intubation Date/Time: 01/28/2019 10:38 AM Performed by: Maxwell Caul, CRNA Pre-anesthesia Checklist: Patient identified, Emergency Drugs available, Suction available and Patient being monitored Patient Re-evaluated:Patient Re-evaluated prior to induction Oxygen Delivery Method: Circle system utilized Preoxygenation: Pre-oxygenation with 100% oxygen Induction Type: IV induction Ventilation: Mask ventilation without difficulty Laryngoscope Size: Mac and 4 Grade View: Grade I Tube type: Oral Tube size: 7.5 mm Number of attempts: 1 Airway Equipment and Method: Stylet Placement Confirmation: ETT inserted through vocal cords under direct vision,  positive ETCO2 and breath sounds checked- equal and bilateral Secured at: 23 cm Tube secured with: Tape Dental Injury: Teeth and Oropharynx as per pre-operative assessment

## 2019-01-28 NOTE — Discharge Instructions (Signed)
[facility]  LAPAROSCOPIC SURGERY: POST OP INSTRUCTIONS Always review your discharge instruction sheet given to you by the facility where your surgery was performed. IF YOU HAVE DISABILITY OR FAMILY LEAVE FORMS, YOU MUST BRING THEM TO THE OFFICE FOR PROCESSING.   DO NOT GIVE THEM TO YOUR DOCTOR.  PAIN CONTROL  1. First take acetaminophen (Tylenol) AND/or ibuprofen (Advil) to control your pain after surgery.  Follow directions on package.  Taking acetaminophen (Tylenol) and/or ibuprofen (Advil) regularly after surgery will help to control your pain and lower the amount of prescription pain medication you may need.  You should not take more than 4,000 mg (4 grams) of acetaminophen (Tylenol) in 24 hours.  You should not take ibuprofen (Advil), aleve, motrin, naprosyn or other NSAIDS if you have a history of stomach ulcers or chronic kidney disease.  2. A prescription for pain medication may be given to you upon discharge.  Take your pain medication as prescribed, if you still have uncontrolled pain after taking acetaminophen (Tylenol) or ibuprofen (Advil). 3. Use ice packs to help control pain. 4. If you need a refill on your pain medication, please contact your pharmacy.  They will contact our office to request authorization. Prescriptions will not be filled after 5pm or on week-ends.  HOME MEDICATIONS 5. Take your usually prescribed medications unless otherwise directed.  DIET 6. You should follow a light diet the first few days after arrival home.  Be sure to include lots of fluids daily. Avoid fatty, fried foods.   CONSTIPATION 7. It is common to experience some constipation after surgery and if you are taking pain medication.  Increasing fluid intake and taking a stool softener (such as Colace) will usually help or prevent this problem from occurring.  A mild laxative (Milk of Magnesia or Miralax) should be taken according to package instructions if there are no bowel  movements after 48 hours.  WOUND/INCISION CARE 8. Most patients will experience some swelling and bruising in the area of the incisions.  Ice packs will help.  Swelling and bruising can take several days to resolve.  9. Unless discharge instructions indicate otherwise, follow guidelines below  a. STERI-STRIPS - you may remove your outer bandages 48 hours after surgery, and you may shower at that time.  You have steri-strips (small skin tapes) in place directly over the incision.  These strips should be left on the skin for 7-10 days.   b. DERMABOND/SKIN GLUE - you may shower in 24 hours.  The glue will flake off over the next 2-3 weeks. 10. Any sutures or staples will be removed at the office during your follow-up visit.  ACTIVITIES 11. You may resume regular (light) daily activities beginning the next day--such as daily self-care, walking, climbing stairs--gradually increasing activities as tolerated.  You may have sexual intercourse when it is comfortable.  Refrain from any heavy lifting or straining until approved by your doctor. a. You may drive when you are no longer taking prescription pain medication, you can comfortably wear a seatbelt, and you can safely maneuver your car and apply brakes.  FOLLOW-UP 12. You should see your doctor in the office for a follow-up appointment approximately 2-3 weeks after your surgery.  You should have been given your post-op/follow-up appointment when your surgery was scheduled.  If you did not receive a post-op/follow-up appointment, make sure that you call for this appointment within a day or two after you arrive home to insure a convenient appointment time.  WHEN TO CALL YOUR DOCTOR: 1. Fever over 101.0 2. Inability to urinate 3. Continued bleeding from incision. 4. Increased pain, redness, or drainage from the incision. 5. Increasing abdominal pain  The clinic staff is available to answer your questions during regular business hours.  Please dont  hesitate to call and ask to speak to one of the nurses for clinical concerns.  If you have a medical emergency, go to the nearest emergency room or call 911.  A surgeon from [facility] is always on call at the hospital. [address] [place], [place], [place]  [address] ? [address], [place], [place]   [phone] ? [phone] ? FAX [phone]     Managing Your Pain After Surgery Without Opioids    Thank you for participating in our program to help patients manage their pain after surgery without opioids. This is part of our effort to provide you with the best care possible, without exposing you or your family to the risk that opioids pose.  What pain can I expect after surgery? You can expect to have some pain after surgery. This is normal. The pain is typically worse the day after surgery, and quickly begins to get better. Many studies have found that many patients are able to manage their pain after surgery with Over-the-Counter (OTC) medications such as Tylenol and Motrin. If you have a condition that does not allow you to take Tylenol or Motrin, notify your surgical team.  How will I manage my pain? The best strategy for controlling your pain after surgery is around the clock pain control with Tylenol (acetaminophen) and Motrin (ibuprofen or Advil). Alternating these medications with each other allows you to maximize your pain control. In addition to Tylenol and Motrin, you can use heating pads or ice packs on your incisions to help reduce your pain.  How will I alternate your regular strength over-the-counter pain medication? You will take a dose of pain medication every three hours. ; Start by taking 650 mg of Tylenol (2 pills of 325 mg) ; 3 hours later take 600 mg of Motrin (3 pills of 200 mg) ; 3 hours after taking the Motrin take 650 mg of Tylenol ; 3 hours after that take 600 mg of Motrin.   - 1 -  See example - if your first  dose of Tylenol is at 12:00 PM   12:00 PM Tylenol 650 mg (2 pills of 325 mg)  3:00 PM Motrin 600 mg (3 pills of 200 mg)  6:00 PM Tylenol 650 mg (2 pills of 325 mg)  9:00 PM Motrin 600 mg (3 pills of 200 mg)  Continue alternating every 3 hours   We recommend that you follow this schedule around-the-clock for at least 3 days after surgery, or until you feel that it is no longer needed. Use the table on the last page of this handout to keep track of the medications you are taking. Important: Do not take more than 3000mg  of Tylenol or 3200mg  of Motrin in a 24-hour period. Do not take ibuprofen/Motrin if you have a history of bleeding stomach ulcers, severe kidney disease, &/or actively taking a blood thinner  What if I still have pain? If you have pain that is not controlled with the over-the-counter pain medications (Tylenol and Motrin or Advil) you might have what we call breakthrough pain. You will receive a prescription for a small amount of an opioid pain medication such as Oxycodone, Tramadol, or  Tylenol with Codeine. Use these opioid pills in the first 24 hours after surgery if you have breakthrough pain. Do not take more than 1 pill every 4-6 hours.  If you still have uncontrolled pain after using all opioid pills, don't hesitate to call our staff using the number provided. We will help make sure you are managing your pain in the best way possible, and if necessary, we can provide a prescription for additional pain medication.   Day 1    Time  Name of Medication Number of pills taken  Amount of Acetaminophen  Pain Level   Comments  AM PM       AM PM       AM PM       AM PM       AM PM       AM PM       AM PM       AM PM       Total Daily amount of Acetaminophen Do not take more than  3,000 mg per day      Day 2    Time  Name of Medication Number of pills taken  Amount of Acetaminophen  Pain Level   Comments  AM PM       AM PM       AM PM       AM PM       AM  PM       AM PM       AM PM       AM PM       Total Daily amount of Acetaminophen Do not take more than  3,000 mg per day      Day 3    Time  Name of Medication Number of pills taken  Amount of Acetaminophen  Pain Level   Comments  AM PM       AM PM       AM PM       AM PM          AM PM       AM PM       AM PM       AM PM       Total Daily amount of Acetaminophen Do not take more than  3,000 mg per day      Day 4    Time  Name of Medication Number of pills taken  Amount of Acetaminophen  Pain Level   Comments  AM PM       AM PM       AM PM       AM PM       AM PM       AM PM       AM PM       AM PM       Total Daily amount of Acetaminophen Do not take more than  3,000 mg per day      Day 5    Time  Name of Medication Number of pills taken  Amount of Acetaminophen  Pain Level   Comments  AM PM       AM PM       AM PM       AM PM       AM PM       AM PM       AM PM  AM PM       Total Daily amount of Acetaminophen Do not take more than  3,000 mg per day       Day 6    Time  Name of Medication Number of pills taken  Amount of Acetaminophen  Pain Level  Comments  AM PM       AM PM       AM PM       AM PM       AM PM       AM PM       AM PM       AM PM       Total Daily amount of Acetaminophen Do not take more than  3,000 mg per day      Day 7    Time  Name of Medication Number of pills taken  Amount of Acetaminophen  Pain Level   Comments  AM PM       AM PM       AM PM       AM PM       AM PM       AM PM       AM PM       AM PM       Total Daily amount of Acetaminophen Do not take more than  3,000 mg per day        For additional information about how and where to safely dispose of unused opioid medications - [employer]  Disclaimer: This document contains information and/or instructional materials adapted from [place] for the typical patient with your condition. It does  not replace medical advice from your health care provider because your experience may differ from that of the typical patient. Talk to your health care provider if you have any questions about this document, your condition or your treatment plan. Adapted from [place]

## 2019-01-28 NOTE — Progress Notes (Addendum)
Day of Surgery  Subjective: Patient just got back from ERCP.  Awake but falls asleep easily.  No current abdominal pain.  Ate liquids well yesterday.  No nausea.  ROS: See above, otherwise other systems negative  Objective: Vital signs in last 24 hours: Temp:  [97.4 F (36.3 C)-98 F (36.7 C)] 97.7 F (36.5 C) (12/04 1302) Pulse Rate:  [66-85] 75 (12/04 1302) Resp:  [11-23] 17 (12/04 1302) BP: (130-162)/(63-100) 151/83 (12/04 1302) SpO2:  [94 %-99 %] 98 % (12/04 1302) Last BM Date: 01/25/19  Intake/Output from previous day: 12/03 0701 - 12/04 0700 In: 2549 [I.V.:2059.8; IV Piggyback:489.3] Out: 750 [Urine:750] Intake/Output this shift: Total I/O In: 1496.3 [I.V.:1235.3; IV Piggyback:261] Out: 100 [Urine:100]  PE: Abd: soft, appropriately tender, +BS, incisions c/d/i with dermabond  Lab Results:  Recent Labs    01/27/19 0402 01/28/19 0352  WBC 6.9 11.6*  HGB 9.2* 10.7*  HCT 28.9* 33.3*  PLT 247 292   BMET Recent Labs    01/26/19 0334 01/28/19 0352  NA 138 139  K 3.6 4.5  CL 109 108  CO2 20* 18*  GLUCOSE 83 142*  BUN 31* 23  CREATININE 0.98 0.86  CALCIUM 7.8* 8.2*   PT/INR No results for input(s): LABPROT, INR in the last 72 hours. CMP     Component Value Date/Time   NA 139 01/28/2019 0352   NA 140 12/22/2018 0830   K 4.5 01/28/2019 0352   CL 108 01/28/2019 0352   CO2 18 (L) 01/28/2019 0352   GLUCOSE 142 (H) 01/28/2019 0352   BUN 23 01/28/2019 0352   BUN 20 12/22/2018 0830   CREATININE 0.86 01/28/2019 0352   CREATININE 1.07 01/29/2017 1242   CALCIUM 8.2 (L) 01/28/2019 0352   PROT 5.6 (L) 01/28/2019 0352   PROT 6.5 12/22/2018 0830   ALBUMIN 2.6 (L) 01/28/2019 0352   ALBUMIN 4.1 12/22/2018 0830   AST 30 01/28/2019 0352   ALT 62 (H) 01/28/2019 0352   ALKPHOS 322 (H) 01/28/2019 0352   BILITOT 0.6 01/28/2019 0352   BILITOT 0.6 12/22/2018 0830   GFRNONAA >60 01/28/2019 0352   GFRAA >60 01/28/2019 0352   Lipase     Component Value  Date/Time   LIPASE 24 01/28/2019 0352       Studies/Results: Dg Cholangiogram Operative  Result Date: 01/27/2019 CLINICAL DATA:  Cholecystectomy for cholelithiasis and cholecystitis. EXAM: INTRAOPERATIVE CHOLANGIOGRAM TECHNIQUE: Cholangiographic images from the C-arm fluoroscopic device were submitted for interpretation post-operatively. Please see the procedural report for the amount of contrast and the fluoroscopy time utilized. COMPARISON:  CT of the abdomen and pelvis on 01/24/1999 FINDINGS: Intraoperative imaging with a C-arm demonstrates mildly dilated appearance of the mid to distal common bile duct with at least 2 discrete filling defects identified in the distal common bile duct just above the ampulla. These appear consistent with calculi. Some contrast does flow around these filling defects and into the duodenum. Intrahepatic bile ducts are normal in caliber. IMPRESSION: Findings suggestive of choledocholithiasis with at least 2 discrete filling defects in the distal common bile duct which are not completely occlusive to flow of contrast into the duodenum. Electronically Signed   By: Aletta Edouard M.D.   On: 01/27/2019 17:23   Dg Ercp With Sphincterotomy  Result Date: 01/28/2019 CLINICAL DATA:  Choledocholithiasis by intraoperative cholangiogram. EXAM: ERCP TECHNIQUE: Multiple spot images obtained with the fluoroscopic device and submitted for interpretation post-procedure. COMPARISON:  Intraoperative cholangiogram on 01/27/2019 FINDINGS: Imaging with a C-arm during ERCP  demonstrates filling defects in the common bile duct. Balloon sweep maneuver was performed and an endoscopic stent placed in the common bile duct. IMPRESSION: Imaging during ERCP demonstrating choledocholithiasis. Stones were extracted and a common bile duct stent placed. These images were submitted for radiologic interpretation only. Please see the procedural report for the amount of contrast and the fluoroscopy time  utilized. Electronically Signed   By: Aletta Edouard M.D.   On: 01/28/2019 12:53    Anti-infectives: Anti-infectives (From admission, onward)   Start     Dose/Rate Route Frequency Ordered Stop   01/28/19 0830  ciprofloxacin (CIPRO) IVPB 400 mg     400 mg 200 mL/hr over 60 Minutes Intravenous  Once 01/28/19 0828 01/28/19 1120   01/25/19 1000  cefTRIAXone (ROCEPHIN) 2 g in sodium chloride 0.9 % 100 mL IVPB     2 g 200 mL/hr over 30 Minutes Intravenous Every 24 hours 01/25/19 0344     01/25/19 0800  metroNIDAZOLE (FLAGYL) IVPB 500 mg     500 mg 100 mL/hr over 60 Minutes Intravenous Every 8 hours 01/25/19 0344     01/25/19 0145  ceFEPIme (MAXIPIME) 2 g in sodium chloride 0.9 % 100 mL IVPB     2 g 200 mL/hr over 30 Minutes Intravenous  Once 01/25/19 0143 01/25/19 0257   01/25/19 0145  metroNIDAZOLE (FLAGYL) IVPB 500 mg     500 mg 100 mL/hr over 60 Minutes Intravenous  Once 01/25/19 0143 01/25/19 0402       Assessment/Plan Melanoma  DM New onset afib - on heparin gtt, echo appears ok with EF of 50-55%, elevated troponin likely secondary to demand ischemia per cards Choledocholithiasis - s/p ERCP  POD 1, s/p lap chole with IOC for Acute cholecystitis with pancreatitis -CBD stones noted yesterday on IOC, s/p ERCP today with plastic stent placement after extraction of stones -CLD today per GI -resume heparin per GI recommendations after sphincterotomy -mobilize and pulm toilet -post op follow up being arranged currently  FEN -CLD, diet may be adv as tolerates from surgery standpoint VTE -heparin gtt, resumption per GI recs.  Ok from surgery standpoint ID -Rocephin/Flagyl x 5 days post op, but can transition to oral likely tomorrow   LOS: 3 days    Henreitta Cea , [facility] 01/28/2019, 1:30 PM Please see [facility] for pager number during day hours 7:00am-4:30pm  Agree with above. Looks good.  His wife just left.  He wants to go home Saturday, which is  fine from surgery.  Dr. Paulita Fujita recommended against anticoagulation for at least 5 days.  Alphonsa Overall, MD, [facility] Office phone:  [phone]

## 2019-01-28 NOTE — Progress Notes (Signed)
PROGRESS NOTE  Andre Walker  DOB: 1933/01/14  PCP: Rita Ohara, MD IRW:[id number]  DOA: 01/24/2019  LOS: 3 days   Chief Complaint  Patient presents with  . Weakness   Brief narrative: Andre Walker is a 85 y.o. male with medical history significant of DM2, BPH, melanoma. Patient started having nausea, vomiting and malaise last week on Thanksgiving day before any meal.  2 days later, 11/28, he went to urgent care and was told to have UTI.  It is unclear if he was started on the antibiotics. 11/30, he returned back to urgent care when the symptoms did not improve and was ultimately sent to the ED. In the ED, patient was in A flutter, rate controlled. CT scan of abdomen showed gallbladder wall thickening with gallstones and pericholecystic fluid c/w acute cholecystitis. Labs showed Alk 622, AST 87, ALT 137, t bili 2.4, lipase level over 800, creatinine up at 1.3 Patient was started on IV antibiotics, heparin drip, admitted to hospitalist service and consulted with general surgery.  Subjective: Patient was seen and examined this afternoon.  Patient underwent ERCP earlier.  Patient was slightly drowsy at the time of my evaluation.  No pain.  Wife at bedside.  Patient still remains in A. fib, rate controlled.   Assessment/Plan: Acute cholecystitis  Choledocholithiasis Mild pancreatitis -Presented with nausea, vomiting.  -CT scan finding as above. -Liver enzyme level and lipase level were elevated.  Improving. -General surgery consult appreciated. -12/3, patient underwent laparoscopic cholecystectomy with intraoperative cholangiogram. -12/4, patient underwent ERCP with removal of calculi, sphincterotomy and biliary duct stenting. -Monitor liver enzymes and lipase level tomorrow. -Full liquid diet for today. -GI recommends against anticoagulation for at least 5 days because of the risk of post sphincterotomy bleeding.  Mild AKI -Creatinine elevated to 1.3 from normal. -Monitor with  IV hydration.  Lisinopril remains on hold.  New onset atrial flutter/A. fib -Self rate controlled.  Classic flutter valves seen in EKG. -CHADSVASC 3 score (age, DM). Patient was kept on heparin drip for anticoagulation.  However per pharmacy, patient is having undetectable heparin level and blood work, may have underlying Antithrombin 3 activity.  Unable to start on any other anticoagulation at this time because of surgery and ERCP with a sphincterotomy. -Continues to remain in self rate controlled A. Fib. -Per GI recommendation, will avoid anticoagulation for at least 5 days. -Patient's wife at bedside states that she sees cardiologist Dr. Martinique as an outpatient and wants to check with him as an outpatient to see if Eliquis is needed. -Echocardiogram showed EF of 50 to 55%.  Diabetes mellitus Hypoglycemia -Metformin on hold.  Patient reports A1c of 6.2.   -Continue sliding scale insulin with Accu-Cheks.  Hyperlipidemia - continue statin.  Mobility: Encourage ambulation.  Patient was independent previously. Diet: Diet postop per surgery Fluid: Normal saline at 100 mL/h.  Continue the same. DVT prophylaxis:  SCDs Code Status:   Code Status: Full Code  Family Communication:  Discussed with wife at bedside. Expected Discharge:  1 to 2 days to home most likely  Consultants:  General surgery  Procedures:  None  Antimicrobials: Anti-infectives (From admission, onward)   Start     Dose/Rate Route Frequency Ordered Stop   01/28/19 0830  ciprofloxacin (CIPRO) IVPB 400 mg     400 mg 200 mL/hr over 60 Minutes Intravenous  Once 01/28/19 0828 01/28/19 1120   01/25/19 1000  cefTRIAXone (ROCEPHIN) 2 g in sodium chloride 0.9 % 100 mL IVPB  2 g 200 mL/hr over 30 Minutes Intravenous Every 24 hours 01/25/19 0344     01/25/19 0800  metroNIDAZOLE (FLAGYL) IVPB 500 mg     500 mg 100 mL/hr over 60 Minutes Intravenous Every 8 hours 01/25/19 0344     01/25/19 0145  ceFEPIme (MAXIPIME) 2 g in  sodium chloride 0.9 % 100 mL IVPB     2 g 200 mL/hr over 30 Minutes Intravenous  Once 01/25/19 0143 01/25/19 0257   01/25/19 0145  metroNIDAZOLE (FLAGYL) IVPB 500 mg     500 mg 100 mL/hr over 60 Minutes Intravenous  Once 01/25/19 0143 01/25/19 0402      Diet Order            Diet clear liquid Room service appropriate? Yes; Fluid consistency: Thin  Diet effective now              Infusions:  . sodium chloride 100 mL/hr at 01/28/19 1210  . cefTRIAXone (ROCEPHIN)  IV Stopped (01/27/19 1012)  . metronidazole 500 mg (01/28/19 1527)    Scheduled Meds: . indomethacin  100 mg Rectal Once  . insulin aspart  0-9 Units Subcutaneous Q4H  . simvastatin  20 mg Oral QHS  . terazosin  5 mg Oral QHS    PRN meds: acetaminophen **OR** acetaminophen, morphine injection, ondansetron **OR** ondansetron (ZOFRAN) IV   Objective: Vitals:   01/28/19 1433 01/28/19 1500  BP: (!) 152/92 (!) 148/77  Pulse: 64 66  Resp: 17 16  Temp: 98.5 F (36.9 C) (!) 97.5 F (36.4 C)  SpO2: 99% 98%    Intake/Output Summary (Last 24 hours) at 01/28/2019 1613 Last data filed at 01/28/2019 1320 Gross per 24 hour  Intake 3215.85 ml  Output 650 ml  Net 2565.85 ml   Filed Weights   01/25/19 0215 01/25/19 0604  Weight: 87.5 kg 85.9 kg   Weight change:  Body mass index is 27.17 kg/m.   Physical Exam after ERCP today: General exam: Drowsy, full effect of anesthesia Skin: No rashes, lesions or ulcers. HEENT: Atraumatic, normocephalic, supple neck, no obvious bleeding Lungs: Clear to auscultation bilaterally CVS: Irregular rhythm, controlled rate, no murmur GI/Abd soft, nondistended, tenderness present in right upper quadrant and epigastric area.  Bowel sound present CNS: Opens eyes on verbal command Psychiatry: Mood appropriate Extremities: No pedal edema, no calf tenderness  Data Review: I have personally reviewed the laboratory data and studies available.  Recent Labs  Lab 01/24/19 2149 01/25/19  0500 01/26/19 0334 01/27/19 0402 01/28/19 0352  WBC 12.2* 11.6* 8.6 6.9 11.6*  NEUTROABS 10.8*  --   --   --  10.3*  HGB 11.3* 9.8* 9.8* 9.2* 10.7*  HCT 34.4* 30.5* 31.3* 28.9* 33.3*  MCV 98.6 100.3* 101.3* 99.7 100.6*  PLT 252 223 232 247 292   Recent Labs  Lab 01/24/19 2149 01/25/19 0500 01/26/19 0334 01/28/19 0352  NA 135 138 138 139  K 3.4* 3.7 3.6 4.5  CL 104 107 109 108  CO2 20* 22 20* 18*  GLUCOSE 191* 152* 83 142*  BUN 39* 36* 31* 23  CREATININE 1.30* 1.14 0.98 0.86  CALCIUM 8.3* 8.0* 7.8* 8.2*  MG  --   --   --  1.5*  PHOS  --   --   --  3.7   Recent Labs  Lab 01/24/19 2149 01/25/19 0500 01/26/19 0334 01/28/19 0352  AST 87* 50* 31 30  ALT 137* 109* 85* 62*  ALKPHOS 622* 459* 353* 322*  BILITOT 2.4*  1.3* 1.2 0.6  PROT 6.2* 5.6* 5.6* 5.6*  ALBUMIN 3.2* 2.7* 2.7* 2.6*    Terrilee Croak, MD  [facility] 01/28/2019

## 2019-01-28 NOTE — H&P (View-Only) (Signed)
[facility] Consultation Note  Referring Provider:  [facility] (Dr. Lucia Gaskins) Primary Care Physician:  Rita Ohara, MD Primary Gastroenterologist:  Dr. Clarene Essex  Reason for Consultation:  Abnormal intraoperative cholangiogram  HPI: Andre Walker is a 85 y.o. male admitted with gallstones pancreatitis.  After clinical resolution of symptoms, he underwent cholecystectomy yesterday.  Intraoperative cholangiogram showed couple of suspected distal bile duct stones.  Patient has some post-operative abdominal soreness, otherwise feels fine.   Past Medical History:  Diagnosis Date  . Actinic keratosis    Dr. Allyson Sabal  . BPH (benign prostatic hyperplasia)    controlled on Hytrin  . Colon polyps    Dr. Watt Climes  . Diabetes mellitus 2004  . ED (erectile dysfunction)   . Hearing loss    high frequency  . Hiatal hernia    small, noted on EGD 10/2010  . Melanoma ([place])    right arm  . Microalbuminuria    on ACEI for microalbuminuria (does NOT have HTN)  . Pure hypercholesterolemia 2006  . Traumatic partial tear of biceps tendon 01/2009   "popeye" injury on right  . Wears glasses   . Wears partial dentures    lower    Past Surgical History:  Procedure Laterality Date  . APPENDECTOMY  1955  . CATARACT EXTRACTION Bilateral R 05/07/15, L 05/21/15   Dr. Katy Fitch  . CHOLECYSTECTOMY N/A 01/27/2019   Procedure: LAPAROSCOPIC CHOLECYSTECTOMY WITH INTRAOPEREATIVE CHOLANGIOGRAM;  Surgeon: Alphonsa Overall, MD;  Location: [facility];  Service: General;  Laterality: N/A;  . COLONOSCOPY  11/09, 10/29/10   Dr. Watt Climes  . ESOPHAGOGASTRODUODENOSCOPY  10/29/10   Dr. Watt Climes  . INGUINAL HERNIA REPAIR Right 1989  . MELANOMA EXCISION  10/08/10   R upper arm, Dr. Hassell Done  . MELANOMA EXCISION  03/16/2012   Procedure: MELANOMA EXCISION;  Surgeon: Pedro Earls, MD;  Location: [facility];  Service: General;  Laterality: Right;  excision of melanoma of right arm  . TOTAL KNEE ARTHROPLASTY  Right 2012  . TOTAL KNEE ARTHROPLASTY Left 2005    Prior to Admission medications   Medication Sig Start Date End Date Taking? Authorizing Provider  acetaminophen (TYLENOL) 325 MG tablet Take 650 mg by mouth every 6 (six) hours as needed for mild pain, moderate pain or headache.    Yes [provider]  aspirin 81 MG tablet Take 81 mg by mouth daily.     Yes [provider]  Calcium Carbonate-Vitamin D (CALCIUM 600+D HIGH POTENCY) 600-400 MG-UNIT per tablet Take 1 tablet by mouth 2 (two) times daily.    Yes [provider]  cephALEXin (KEFLEX) 500 MG capsule Take 500 mg by mouth 4 (four) times daily. 01/23/19  Yes [provider]  Coenzyme Q10 (COQ10) 100 MG CAPS Take 1 capsule by mouth daily.     Yes [provider]  glucosamine-chondroitin 500-400 MG tablet Take 2 tablets by mouth daily.    Yes [provider]  glucose blood test strip 1 each by Other route daily. Use as instructed 12/09/17  Yes Rita Ohara, MD  lisinopril (ZESTRIL) 5 MG tablet Take 1 tablet (5 mg total) by mouth daily. 08/02/18  Yes Rita Ohara, MD  loratadine (CLARITIN) 10 MG tablet Take 10 mg by mouth daily.   Yes [provider]  metFORMIN (GLUCOPHAGE-XR) 500 MG 24 hr tablet TAKE 2 TABLETS BY MOUTH ONCE DAILY WITH BREAKFAST Patient taking differently: Take 1,000 mg by mouth daily with breakfast.  11/02/18  Yes Tomi Bamberger,  Eve, MD  Multiple Vitamins-Minerals (MULTIVITAMIN WITH MINERALS) tablet Take 1 tablet by mouth daily.     Yes [provider]  Omega-3 Fatty Acids (FISH OIL PO) Take 1,600 mg by mouth daily.   Yes [provider]  [facility] DELICA LANCETS [id number] MISC 1 each by Does not apply route daily. 12/09/17  Yes Rita Ohara, MD  simvastatin (ZOCOR) 20 MG tablet Take 1 tablet (20 mg total) by mouth at bedtime. 08/02/18  Yes Rita Ohara, MD  terazosin (HYTRIN) 5 MG capsule Take 1 capsule by mouth at bedtime 01/17/19  Yes Rita Ohara, MD    Current  Facility-Administered Medications  Medication Dose Route Frequency Provider Last Rate Last Dose  . 0.9 %  sodium chloride infusion   Intravenous Continuous Alphonsa Overall, MD 100 mL/hr at 01/28/19 0415    . [MAR Hold] acetaminophen (TYLENOL) tablet 650 mg  650 mg Oral Q6H PRN Alphonsa Overall, MD       Or  . Doug Sou Hold] acetaminophen (TYLENOL) suppository 650 mg  650 mg Rectal Q6H PRN Alphonsa Overall, MD      . Doug Sou Hold] cefTRIAXone (ROCEPHIN) 2 g in sodium chloride 0.9 % 100 mL IVPB  2 g Intravenous Q24H Alphonsa Overall, MD   Stopped at 01/27/19 1012  . ciprofloxacin (CIPRO) IVPB 400 mg  400 mg Intravenous Once Arta Silence, MD      . indomethacin (INDOCIN) 50 MG suppository 100 mg  100 mg Rectal Once Arta Silence, MD      . Doug Sou Hold] insulin aspart (novoLOG) injection 0-9 Units  0-9 Units Subcutaneous Q4H Alphonsa Overall, MD   2 Units at 01/28/19 [phone]  . lactated ringers infusion   Intravenous Continuous Arta Silence, MD 20 mL/hr at 01/28/19 [phone]    . [MAR Hold] metroNIDAZOLE (FLAGYL) IVPB 500 mg  500 mg Intravenous Tenna Child, MD 100 mL/hr at 01/28/19 0748 500 mg at 01/28/19 0748  . [MAR Hold] morphine 2 MG/ML injection 1-3 mg  1-3 mg Intravenous Q2H PRN Alphonsa Overall, MD   2 mg at 01/27/19 2215  . [MAR Hold] ondansetron (ZOFRAN) tablet 4 mg  4 mg Oral Q6H PRN Alphonsa Overall, MD       Or  . Doug Sou Hold] ondansetron [facility]) injection 4 mg  4 mg Intravenous Q6H PRN Alphonsa Overall, MD      . Doug Sou Hold] simvastatin (ZOCOR) tablet 20 mg  20 mg Oral QHS Alphonsa Overall, MD   20 mg at 01/27/19 2217  . [MAR Hold] terazosin (HYTRIN) capsule 5 mg  5 mg Oral QHS Alphonsa Overall, MD   5 mg at 01/27/19 2217    Allergies as of 01/24/2019 - Review Complete 01/24/2019  Allergen Reaction Noted  . Penicillins Rash 08/23/2010    Family History  Problem Relation Age of Onset  . Diabetes Sister   . Diabetes Brother   . Heart disease Brother   . Hyperlipidemia Brother   . Hypertension Brother   . Stroke  Brother   . Diabetes Sister   . Cancer Paternal Uncle   . Heart disease Paternal Uncle     Social History   Socioeconomic History  . Marital status: Married    Spouse name: Not on file  . Number of children: 2  . Years of education: Not on file  . Highest education level: Not on file  Occupational History  . Occupation: Retired (from post office)  Social Needs  . Financial resource strain: Not on file  . Food  insecurity    Worry: Not on file    Inability: Not on file  . Transportation needs    Medical: Not on file    Non-medical: Not on file  Tobacco Use  . Smoking status: Never Smoker  . Smokeless tobacco: Never Used  Substance and Sexual Activity  . Alcohol use: Yes    Alcohol/week: 2.0 - 3.0 standard drinks    Types: 2 - 3 Glasses of wine per week    Comment: 1 glass of wine 1-2 times per month.  . Drug use: No  . Sexual activity: Not Currently    Partners: Female  Lifestyle  . Physical activity    Days per week: Not on file    Minutes per session: Not on file  . Stress: Not on file  Relationships  . Social Herbalist on phone: Not on file    Gets together: Not on file    Attends religious service: Not on file    Active member of club or organization: Not on file    Attends meetings of clubs or organizations: Not on file    Relationship status: Not on file  . Intimate partner violence    Fear of current or ex partner: Not on file    Emotionally abused: Not on file    Physically abused: Not on file    Forced sexual activity: Not on file  Other Topics Concern  . Not on file  Social History Narrative   1 son in [place], 1 son in [place].    Previously did handyman work (very little now).     Moved to [place] 09/2012   3 grandchildren    Review of Systems: As per HPI, all others negative  Physical Exam: Vital signs in last 24 hours: Temp:  [97.4 F (36.3 C)-98 F (36.7 C)] 97.6 F (36.4 C) (12/04 0837) Pulse Rate:   [66-84] 84 (12/04 0837) Resp:  [11-18] 16 (12/04 0837) BP: (132-148)/(63-100) 132/63 (12/04 0837) SpO2:  [95 %-99 %] 95 % (12/04 0837) Last BM Date: 01/25/19 General:   Alert,  Well-developed, well-nourished, pleasant and cooperative in NAD Head:  Normocephalic and atraumatic. Eyes:  Sclera clear, no icterus.   Conjunctiva pink. Ears:  Normal auditory acuity. Nose:  No deformity, discharge,  or lesions. Mouth:  No deformity or lesions.  Oropharynx pink & moist. Neck:  Supple; no masses or thyromegaly. Heart: irregularly irregular Abdomen:  Soft, mild postoperative tenderness, no peritonitis. No masses, hepatosplenomegaly or hernias noted. Normal bowel sounds, without guarding, and without rebound.     Msk:  Symmetrical without gross deformities. Normal posture. Pulses:  Normal pulses noted. Extremities:  Without clubbing or edema. Neurologic:  Alert and  oriented x4; diffusely weak, otherwise grossly normal neurologically. Skin:  Scattered ecchymoses, otherwise intact without significant lesions or rashes. Cervical Nodes:  No significant cervical adenopathy. Psych:  Alert and cooperative. Normal mood and affect.   Lab Results: Recent Labs    01/26/19 0334 01/27/19 0402 01/28/19 0352  WBC 8.6 6.9 11.6*  HGB 9.8* 9.2* 10.7*  HCT 31.3* 28.9* 33.3*  PLT 232 247 292   BMET Recent Labs    01/26/19 0334 01/28/19 0352  NA 138 139  K 3.6 4.5  CL 109 108  CO2 20* 18*  GLUCOSE 83 142*  BUN 31* 23  CREATININE 0.98 0.86  CALCIUM 7.8* 8.2*   LFT Recent Labs    01/28/19 0352  PROT 5.6*  ALBUMIN 2.6*  AST 30  ALT 62*  ALKPHOS 322*  BILITOT 0.6   PT/INR No results for input(s): LABPROT, INR in the last 72 hours.  Studies/Results: Dg Cholangiogram Operative  Result Date: 01/27/2019 CLINICAL DATA:  Cholecystectomy for cholelithiasis and cholecystitis. EXAM: INTRAOPERATIVE CHOLANGIOGRAM TECHNIQUE: Cholangiographic images from the C-arm fluoroscopic device were submitted  for interpretation post-operatively. Please see the procedural report for the amount of contrast and the fluoroscopy time utilized. COMPARISON:  CT of the abdomen and pelvis on 01/24/1999 FINDINGS: Intraoperative imaging with a C-arm demonstrates mildly dilated appearance of the mid to distal common bile duct with at least 2 discrete filling defects identified in the distal common bile duct just above the ampulla. These appear consistent with calculi. Some contrast does flow around these filling defects and into the duodenum. Intrahepatic bile ducts are normal in caliber. IMPRESSION: Findings suggestive of choledocholithiasis with at least 2 discrete filling defects in the distal common bile duct which are not completely occlusive to flow of contrast into the duodenum. Electronically Signed   By: Aletta Edouard M.D.   On: 01/27/2019 17:23    Impression:  1.  GS pancreatitis, clinically resolved. 2.  Gallstones, POD 1 cholecystectomy. 3.  Elevated LFTs, mild 4.  Abnormal intraoperative cholangiogram, bile duct stone(s) seen. 5.  New onset atrial fibrillation.  Plan:  1.  Heparin on hold x 6 hours. 2.  Endoscopic retrograde cholangiopancreatography with anticipated biliary sphincterotomy and bile duct stone extraction. 3.  Risks (up to and including bleeding, infection, perforation, pancreatitis that can be complicated by infected necrosis and death), benefits (removal of stones, alleviating blockage, decreasing risk of cholangitis or choledocholithiasis-related pancreatitis), and alternatives (watchful waiting, percutaneous transhepatic cholangiography) of ERCP were explained to patient/family in detail and patient elects to proceed.   LOS: 3 days   Gusta Marksberry M  01/28/2019, 9:03 AM  Cell [phone] If no answer or after 5 PM call [phone]

## 2019-01-28 NOTE — Interval H&P Note (Signed)
History and Physical Interval Note:  01/28/2019 9:07 AM  Andre Walker  has presented today for surgery, with the diagnosis of bile duct stone.  The various methods of treatment have been discussed with the patient and family. After consideration of risks, benefits and other options for treatment, the patient has consented to  Procedure(s): ENDOSCOPIC RETROGRADE CHOLANGIOPANCREATOGRAPHY (ERCP) (N/A) as a surgical intervention.  The patient's history has been reviewed, patient examined, no change in status, stable for surgery.  I have reviewed the patient's chart and labs.  Questions were answered to the patient's satisfaction.     [name]

## 2019-01-29 LAB — GLUCOSE, CAPILLARY
Glucose-Capillary: 143 mg/dL — ABNORMAL HIGH (ref 70–99)
Glucose-Capillary: 136 mg/dL — ABNORMAL HIGH (ref 70–99)
Glucose-Capillary: 115 mg/dL — ABNORMAL HIGH (ref 70–99)
Glucose-Capillary: 176 mg/dL — ABNORMAL HIGH (ref 70–99)

## 2019-01-29 LAB — COMPREHENSIVE METABOLIC PANEL
ALT: 48 U/L — ABNORMAL HIGH (ref 0–44)
AST: 25 U/L (ref 15–41)
Albumin: 2.4 g/dL — ABNORMAL LOW (ref 3.5–5.0)
Alkaline Phosphatase: 255 U/L — ABNORMAL HIGH (ref 38–126)
Anion gap: 7 (ref 5–15)
BUN: 27 mg/dL — ABNORMAL HIGH (ref 8–23)
CO2: 22 mmol/L (ref 22–32)
Calcium: 8 mg/dL — ABNORMAL LOW (ref 8.9–10.3)
Chloride: 109 mmol/L (ref 98–111)
Creatinine, Ser: 0.88 mg/dL (ref 0.61–1.24)
GFR calc Af Amer: >60 mL/min (ref >60)
GFR calc non Af Amer: >60 mL/min (ref >60)
Glucose, Bld: 148 mg/dL — ABNORMAL HIGH (ref 70–99)
Potassium: 4.3 mmol/L (ref 3.5–5.1)
Sodium: 138 mmol/L (ref 135–145)
Total Bilirubin: 0.6 mg/dL (ref 0.3–1.2)
Total Protein: 5.2 g/dL — ABNORMAL LOW (ref 6.5–8.1)

## 2019-01-29 LAB — LIPASE, BLOOD: Lipase: 24 U/L (ref 11–51)

## 2019-01-29 LAB — MAGNESIUM: Magnesium: 1.6 mg/dL — ABNORMAL LOW (ref 1.7–2.4)

## 2019-01-29 MED ORDER — CEFDINIR 300 MG PO CAPS: 300.0000 mg | ORAL_CAPSULE | Freq: Two times a day (BID) | ORAL | 0 refills | Status: AC

## 2019-01-29 MED ORDER — SACCHAROMYCES BOULARDII 250 MG PO CAPS: 250.0000 mg | ORAL_CAPSULE | Freq: Two times a day (BID) | ORAL | 0 refills | Status: AC

## 2019-01-29 MED ORDER — METRONIDAZOLE 500 MG PO TABS: 500.0000 mg | ORAL_TABLET | Freq: Three times a day (TID) | ORAL | 0 refills | Status: AC

## 2019-01-29 NOTE — Progress Notes (Signed)
1 Day Post-Op  Subjective: Doing great - denies any abdominal pain. Eating this morning.  No nausea/vomiting   Objective: Vital signs in last 24 hours: Temp:  [97.5 F (36.4 C)-98.5 F (36.9 C)] 97.5 F (36.4 C) (12/05 0501) Pulse Rate:  [63-85] 64 (12/05 0501) Resp:  [15-23] 20 (12/05 0501) BP: (126-162)/(67-92) 128/75 (12/05 0501) SpO2:  [92 %-99 %] 98 % (12/05 0501) Last BM Date: 01/25/19  Intake/Output from previous day: 12/04 0701 - 12/05 0700 In: 3654.7 [P.O.:360; I.V.:2697.4; IV Piggyback:597.3] Out: 600 [Urine:600] Intake/Output this shift: No intake/output data recorded.  PE: Abd: soft, appropriately tender, +BS, incisions c/d/i with dermabond  Lab Results:  Recent Labs    01/27/19 0402 01/28/19 0352  WBC 6.9 11.6*  HGB 9.2* 10.7*  HCT 28.9* 33.3*  PLT 247 292   BMET Recent Labs    01/28/19 0352 01/29/19 0333  NA 139 138  K 4.5 4.3  CL 108 109  CO2 18* 22  GLUCOSE 142* 148*  BUN 23 27*  CREATININE 0.86 0.88  CALCIUM 8.2* 8.0*   PT/INR No results for input(s): LABPROT, INR in the last 72 hours. CMP     Component Value Date/Time   NA 138 01/29/2019 0333   NA 140 12/22/2018 0830   K 4.3 01/29/2019 0333   CL 109 01/29/2019 0333   CO2 22 01/29/2019 0333   GLUCOSE 148 (H) 01/29/2019 0333   BUN 27 (H) 01/29/2019 0333   BUN 20 12/22/2018 0830   CREATININE 0.88 01/29/2019 0333   CREATININE 1.07 01/29/2017 1242   CALCIUM 8.0 (L) 01/29/2019 0333   PROT 5.2 (L) 01/29/2019 0333   PROT 6.5 12/22/2018 0830   ALBUMIN 2.4 (L) 01/29/2019 0333   ALBUMIN 4.1 12/22/2018 0830   AST 25 01/29/2019 0333   ALT 48 (H) 01/29/2019 0333   ALKPHOS 255 (H) 01/29/2019 0333   BILITOT 0.6 01/29/2019 0333   BILITOT 0.6 12/22/2018 0830   GFRNONAA >60 01/29/2019 0333   GFRAA >60 01/29/2019 0333   Lipase     Component Value Date/Time   LIPASE 24 01/29/2019 0333       Studies/Results: Dg Cholangiogram Operative  Result Date: 01/27/2019 CLINICAL DATA:   Cholecystectomy for cholelithiasis and cholecystitis. EXAM: INTRAOPERATIVE CHOLANGIOGRAM TECHNIQUE: Cholangiographic images from the C-arm fluoroscopic device were submitted for interpretation post-operatively. Please see the procedural report for the amount of contrast and the fluoroscopy time utilized. COMPARISON:  CT of the abdomen and pelvis on 01/24/1999 FINDINGS: Intraoperative imaging with a C-arm demonstrates mildly dilated appearance of the mid to distal common bile duct with at least 2 discrete filling defects identified in the distal common bile duct just above the ampulla. These appear consistent with calculi. Some contrast does flow around these filling defects and into the duodenum. Intrahepatic bile ducts are normal in caliber. IMPRESSION: Findings suggestive of choledocholithiasis with at least 2 discrete filling defects in the distal common bile duct which are not completely occlusive to flow of contrast into the duodenum. Electronically Signed   By: Aletta Edouard M.D.   On: 01/27/2019 17:23   Dg Ercp With Sphincterotomy  Result Date: 01/28/2019 CLINICAL DATA:  Choledocholithiasis by intraoperative cholangiogram. EXAM: ERCP TECHNIQUE: Multiple spot images obtained with the fluoroscopic device and submitted for interpretation post-procedure. COMPARISON:  Intraoperative cholangiogram on 01/27/2019 FINDINGS: Imaging with a C-arm during ERCP demonstrates filling defects in the common bile duct. Balloon sweep maneuver was performed and an endoscopic stent placed in the common bile duct. IMPRESSION:  Imaging during ERCP demonstrating choledocholithiasis. Stones were extracted and a common bile duct stent placed. These images were submitted for radiologic interpretation only. Please see the procedural report for the amount of contrast and the fluoroscopy time utilized. Electronically Signed   By: Aletta Edouard M.D.   On: 01/28/2019 12:53    Anti-infectives: Anti-infectives (From admission,  onward)   Start     Dose/Rate Route Frequency Ordered Stop   01/28/19 0830  ciprofloxacin (CIPRO) IVPB 400 mg     400 mg 200 mL/hr over 60 Minutes Intravenous  Once 01/28/19 0828 01/28/19 1120   01/25/19 1000  cefTRIAXone (ROCEPHIN) 2 g in sodium chloride 0.9 % 100 mL IVPB     2 g 200 mL/hr over 30 Minutes Intravenous Every 24 hours 01/25/19 0344     01/25/19 0800  metroNIDAZOLE (FLAGYL) IVPB 500 mg     500 mg 100 mL/hr over 60 Minutes Intravenous Every 8 hours 01/25/19 0344     01/25/19 0145  ceFEPIme (MAXIPIME) 2 g in sodium chloride 0.9 % 100 mL IVPB     2 g 200 mL/hr over 30 Minutes Intravenous  Once 01/25/19 0143 01/25/19 0257   01/25/19 0145  metroNIDAZOLE (FLAGYL) IVPB 500 mg     500 mg 100 mL/hr over 60 Minutes Intravenous  Once 01/25/19 0143 01/25/19 0402       Assessment/Plan Melanoma  DM New onset afib - on heparin gtt, echo appears ok with EF of 50-55%, elevated troponin likely secondary to demand ischemia per cards Choledocholithiasis - s/p ERCP  POD 2, s/p lap chole with IOC for Acute cholecystitis with pancreatitis -CBD stones noted on IOC, s/p ERCP 12/4 with plastic stent placement after extraction of stones -Diet as tolerated -resume heparin per GI recommendations after sphincterotomy - said nothing for 5 days -mobilize and pulm toilet -post op follow up being arranged currently  FEN - diet as tolerated VTE -as per GI recs. ID -Rocephin/Flagyl x 5 days post op, but can transition to oral today  [place] for discharge from our standpoint if ok with GI/medicine.   LOS: 4 days  Nadeen Landau, M.D. [facility] Use [email or web address] to contact on call provider

## 2019-01-29 NOTE — Plan of Care (Signed)

## 2019-01-29 NOTE — Discharge Summary (Signed)
Physician Discharge Summary  Andre Walker HDQ:[id number] DOB: 11-08-1932 DOA: 01/24/2019  PCP: Rita Ohara, MD  Admit date: 01/24/2019 Discharge date: 01/29/2019  Admitted From: home Discharge disposition: home   Code Status: Full Code  Diet Recommendation: Soft diet for now.  Advance to regular if tolerated   Recommendations for Outpatient Follow-Up:   1. Follow-up with GI as an outpatient  Discharge Diagnosis:   Principal Problem:   Acute cholecystitis Active Problems:   Pure hypercholesterolemia   AKI (acute kidney injury) ([place])   Diabetes mellitus with microalbuminuria (Andre Walker)   New onset a-fib (Andre Walker)  History of Present Illness / Brief narrative:  Andre Walker a 85 y.o.malewith medical history significant ofDM2, BPH, melanoma. Patient started having nausea, vomiting and malaise last week on Thanksgiving day before any meal.  2 days later, 11/28, he went to urgent care and was told to have UTI.  It is unclear if he was started on the antibiotics. 11/30, he returned back to urgent care when the symptoms did not improve and was ultimately sent to the ED. In the ED, patient was in A flutter, rate controlled. CT scan of abdomen showed gallbladder wall thickening with gallstones and pericholecystic fluid c/w acute cholecystitis. Labs showedAlk 622, AST 87, ALT 137, t bili 2.4, lipase level over 800, creatinine up at 1.3 Patient was started on IV antibiotics, heparin drip, admitted to hospitalist service and consulted with general surgery.  Hospital Course:  Acute cholecystitis  Choledocholithiasis Mild pancreatitis -Presented with nausea, vomiting.  -CT scan finding as above. -Liver enzyme level and lipase level were elevated.  Improving. -General surgery consult appreciated. -12/3, patient underwent laparoscopic cholecystectomy with intraoperative cholangiogram. -12/4, patient underwent ERCP with removal of calculi, sphincterotomy and biliary duct stenting.  -Patient feels better.  Liver enzymes improving. -Able to tolerate soft diet today. -GI recommends against anticoagulation for at least 5 days because of the risk of post sphincterotomy bleeding. -Oral antibiotics for next 4 days post discharge.  Mild AKI -Creatinine elevated to 1.3 from normal. -Monitor with IV hydration.  Lisinopril remains on hold.  New onset atrial flutter/A. fib -Self rate controlled.  Classic flutter valves seen in EKG. -CHADSVASC 3 score (age, DM). Patient was kept on heparin drip for anticoagulation.  However per pharmacy, patient is having undetectable heparin level and blood work, may have underlying Antithrombin 3 activity.  Unable to start on any other anticoagulation at this time because of surgery and ERCP with a sphincterotomy. -Continues to remain in self rate controlled A. Fib. -Per GI recommendation, will avoid anticoagulation for at least 5 days. -Patient's wife at bedside states that she sees cardiologist Dr. Martinique as an outpatient and she wants patient as well to see him as an outpatient.  They will decide with Dr. Martinique about initiation of Eliquis. -Echocardiogram showed EF of 50 to 55%.  Diabetes mellitus Hypoglycemia -Metformin on hold.  Patient reports A1c of 6.2.   -Resume metformin post discharge.  Hyperlipidemia - continue statin.  Stable for discharge to home today.  Subjective:  Seen and examined this morning.  Pleasant elderly Caucasian male.  Lying down in bed.  Not in distress.  No new symptoms.  Feels better and ready to go home today.  Discharge Exam:   Vitals:   01/28/19 1800 01/28/19 2108 01/29/19 0104 01/29/19 0501  BP:  (!) 149/88 126/80 128/75  Pulse:  68 63 64  Resp:  20  20  Temp:  97.7 F (36.5 C) (!) 97.5  F (36.4 C) (!) 97.5 F (36.4 C)  TempSrc:  Oral Oral Oral  SpO2: 95% 96% 92% 98%  Weight:      Height:        Body mass index is 27.17 kg/m.  General exam: Appears calm and comfortable.  Skin: No  rashes, lesions or ulcers. HEENT: Atraumatic, normocephalic, supple neck, no obvious bleeding Lungs: Clear to auscultation bilaterally CVS: Regular rate and rhythm, no murmur GI/Abd soft, nontender, nondistended, bowel sound present CNS: Alert, awake, oriented x3 Psychiatry: Mood appropriate Extremities: No pedal edema, no calf tenderness  Discharge Instructions:  Wound care: none Discharge Instructions    Diet general   Complete by: As directed    Soft diet   Increase activity slowly   Complete by: As directed      Follow-up Information    [facility], [facility] Follow up on 02/15/2019.   Specialty: General Surgery Why: This will be a telephone visit.  They will call you at 10:30am.  If you have any concerns about your incisions you may email a picture to [email or web address] Contact information: [place] [address] [phone]        Arta Silence, MD Follow up.   Specialty: Gastroenterology Contact information: [address] [place] [address] [phone]          Allergies as of 01/29/2019      Reactions   Penicillins Rash   Has patient had a PCN reaction causing immediate rash, facial/tongue/throat swelling, SOB or lightheadedness with hypotension: No Has patient had a PCN reaction causing severe rash involving mucus membranes or skin necrosis: No Has patient had a PCN reaction that required hospitalization: No Has patient had a PCN reaction occurring within the last 10 years: No If all of the above answers are "NO", then may proceed with Cephalosporin use.      Medication List    STOP taking these medications   cephALEXin 500 MG capsule Commonly known as: KEFLEX     TAKE these medications   acetaminophen 325 MG tablet Commonly known as: TYLENOL Take 650 mg by mouth every 6 (six) hours as needed for mild pain, moderate pain or headache.   aspirin 81 MG tablet Take 81 mg by mouth daily.    Calcium 600+D High Potency 600-400 MG-UNIT tablet Generic drug: Calcium Carbonate-Vitamin D Take 1 tablet by mouth 2 (two) times daily.   cefdinir 300 MG capsule Commonly known as: OMNICEF Take 1 capsule (300 mg total) by mouth 2 (two) times daily for 4 days.   CoQ10 100 MG Caps Take 1 capsule by mouth daily.   FISH OIL PO Take 1,600 mg by mouth daily.   glucosamine-chondroitin 500-400 MG tablet Take 2 tablets by mouth daily.   glucose blood test strip 1 each by Other route daily. Use as instructed   lisinopril 5 MG tablet Commonly known as: ZESTRIL Take 1 tablet (5 mg total) by mouth daily.   loratadine 10 MG tablet Commonly known as: CLARITIN Take 10 mg by mouth daily.   metFORMIN 500 MG 24 hr tablet Commonly known as: GLUCOPHAGE-XR TAKE 2 TABLETS BY MOUTH ONCE DAILY WITH BREAKFAST What changed: See the new instructions.   metroNIDAZOLE 500 MG tablet Commonly known as: Flagyl Take 1 tablet (500 mg total) by mouth 3 (three) times daily for 4 days.   multivitamin with minerals tablet Take 1 tablet by mouth daily.   OneTouch Delica Lancets 19Q Misc 1 each by  Does not apply route daily.   saccharomyces boulardii 250 MG capsule Commonly known as: FLORASTOR Take 1 capsule (250 mg total) by mouth 2 (two) times daily for 5 days.   simvastatin 20 MG tablet Commonly known as: ZOCOR Take 1 tablet (20 mg total) by mouth at bedtime.   terazosin 5 MG capsule Commonly known as: HYTRIN Take 1 capsule by mouth at bedtime       Time coordinating discharge: 35 minutes  The results of significant diagnostics from this hospitalization (including imaging, microbiology, ancillary and laboratory) are listed below for reference.    Procedures and Diagnostic Studies:   Dg Chest 2 View  Result Date: 01/24/2019 CLINICAL DATA:  85 year old male with weakness for several days. EXAM: CHEST - 2 VIEW COMPARISON:  Chest radiographs 01/23/2019 and earlier. FINDINGS: Semi upright  AP and lateral views of the chest. Lung volumes are stable and within normal limits. Cardiac size at the upper limits of normal. Other mediastinal contours are within normal limits. No pneumothorax, pulmonary edema, pleural effusion or confluent pulmonary opacity. Right axillary surgical clips. No acute osseous abnormality identified. Negative visible bowel gas pattern. IMPRESSION: No acute cardiopulmonary abnormality. Electronically Signed   By: Genevie Ann M.D.   On: 01/24/2019 21:23   Ct Head Wo Contrast  Result Date: 01/24/2019 CLINICAL DATA:  85 year old male with altered mental status. EXAM: CT HEAD WITHOUT CONTRAST TECHNIQUE: Contiguous axial images were obtained from the base of the skull through the vertex without intravenous contrast. COMPARISON:  None. FINDINGS: Brain: Mild age-related atrophy and chronic microvascular ischemic changes. There is no acute intracranial hemorrhage. No mass effect or midline shift. No extra-axial fluid collection. Vascular: No hyperdense vessel or unexpected calcification. Skull: Normal. Negative for fracture or focal lesion. Sinuses/Orbits: Mild mucoperiosteal thickening paranasal sinuses. There is partial opacification of the left sphenoid sinus. No air-fluid level. The mastoid air cells are clear. Other: None IMPRESSION: 1. No acute intracranial hemorrhage. 2. Mild age-related atrophy and chronic microvascular ischemic changes. Electronically Signed   By: Anner Crete M.D.   On: 01/24/2019 21:27   Ct Abdomen Pelvis W Contrast  Result Date: 01/24/2019 CLINICAL DATA:  Acute abdominal pain EXAM: CT ABDOMEN AND PELVIS WITH CONTRAST TECHNIQUE: Multidetector CT imaging of the abdomen and pelvis was performed using the standard protocol following bolus administration of intravenous contrast. CONTRAST:  150m OMNIPAQUE IOHEXOL 300 MG/ML  SOLN COMPARISON:  January 24, 2017 FINDINGS: Lower chest: There is moderate cardiomegaly. Patchy ground-glass opacities seen at both  lung bases, right greater than left with tree-in-bud opacity seen peripherally. No hiatal hernia. Hepatobiliary: The liver is normal in density without focal abnormality.The main portal vein is patent. There is mild hyperenhancement of the gallbladder wall with surrounding fat stranding changes and a pericholecystic fluid. There is layering calcified gallstones. No free air however is noted. Mild intrahepatic biliary ductal dilatation is noted. Pancreas: Unremarkable. No pancreatic ductal dilatation or surrounding inflammatory changes. Spleen: Normal in size without focal abnormality. Adrenals/Urinary Tract: Both adrenal glands appear normal. There is a 3 mm calculus seen in the lower pole the left kidney. No hydronephrosis is noted. No ureteral calculi seen. Bladder is unremarkable. Stomach/Bowel: The stomach, small bowel are normal in appearance. Scattered colonic diverticula are noted without diverticulitis. No inflammatory changes, wall thickening, or obstructive findings.The appendix is normal. Vascular/Lymphatic: There are no enlarged mesenteric, retroperitoneal, or pelvic lymph nodes. Scattered aortic atherosclerotic calcifications are seen without aneurysmal dilatation. Ectasia of the infrarenal abdominal aorta is seen with calcified and  noncalcified plaque measuring up to 2.6 cm para Reproductive: There is a heterogeneously enlarged prostate gland measuring 6 cm in transverse dimension. Other: No evidence of abdominal wall mass. Small bilateral fat containing inguinal hernias are noted. Musculoskeletal: No acute or significant osseous findings. A dextroconvex scoliotic curvature of the lumbar spine is noted. There is advanced degenerative changes seen in the lower lumbar spine at L4-L5 and L5-S1. IMPRESSION: 1. Hyperenhancement of the gallbladder wall with layering gallstones, surrounding inflammatory changes in pericholecystic fluid which could be suggestive of acute cholecystitis. 2. patchy/ground-glass  opacities within both lung bases, right greater than left which is non-specific could be due to at atelectasis, and/or early infectious etiology. 3. Diverticulosis without diverticulitis. 4. Nonobstructing 3 mm left renal calculi. Electronically Signed   By: Prudencio Pair M.D.   On: 01/24/2019 23:48     Labs:   Basic Metabolic Panel: Recent Labs  Lab 01/24/19 2149 01/25/19 0500 01/26/19 0334 01/28/19 0352 01/29/19 0333  NA 135 138 138 139 138  K 3.4* 3.7 3.6 4.5 4.3  CL 104 107 109 108 109  CO2 20* 22 20* 18* 22  GLUCOSE 191* 152* 83 142* 148*  BUN 39* 36* 31* 23 27*  CREATININE 1.30* 1.14 0.98 0.86 0.88  CALCIUM 8.3* 8.0* 7.8* 8.2* 8.0*  MG  --   --   --  1.5* 1.6*  PHOS  --   --   --  3.7  --    GFR Estimated Creatinine Clearance: 64.5 mL/min (by C-G formula based on SCr of 0.88 mg/dL). Liver Function Tests: Recent Labs  Lab 01/24/19 2149 01/25/19 0500 01/26/19 0334 01/28/19 0352 01/29/19 0333  AST 87* 50* 31 30 25   ALT 137* 109* 85* 62* 48*  ALKPHOS 622* 459* 353* 322* 255*  BILITOT 2.4* 1.3* 1.2 0.6 0.6  PROT 6.2* 5.6* 5.6* 5.6* 5.2*  ALBUMIN 3.2* 2.7* 2.7* 2.6* 2.4*   Recent Labs  Lab 01/24/19 2354 01/25/19 0500 01/26/19 0334 01/28/19 0352 01/29/19 0333  LIPASE 892* 208* 279* 24 24   No results for input(s): AMMONIA in the last 168 hours. Coagulation profile No results for input(s): INR, PROTIME in the last 168 hours.  CBC: Recent Labs  Lab 01/24/19 2149 01/25/19 0500 01/26/19 0334 01/27/19 0402 01/28/19 0352  WBC 12.2* 11.6* 8.6 6.9 11.6*  NEUTROABS 10.8*  --   --   --  10.3*  HGB 11.3* 9.8* 9.8* 9.2* 10.7*  HCT 34.4* 30.5* 31.3* 28.9* 33.3*  MCV 98.6 100.3* 101.3* 99.7 100.6*  PLT 252 223 232 247 292   Cardiac Enzymes: No results for input(s): CKTOTAL, CKMB, CKMBINDEX, TROPONINI in the last 168 hours. BNP: Invalid input(s): POCBNP CBG: Recent Labs  Lab 01/28/19 2111 01/29/19 0100 01/29/19 0456 01/29/19 0850 01/29/19 1226  GLUCAP  199* 143* 136* 115* 176*   D-Dimer No results for input(s): DDIMER in the last 72 hours. Hgb A1c No results for input(s): HGBA1C in the last 72 hours. Lipid Profile No results for input(s): CHOL, HDL, LDLCALC, TRIG, CHOLHDL, LDLDIRECT in the last 72 hours. Thyroid function studies No results for input(s): TSH, T4TOTAL, T3FREE, THYROIDAB in the last 72 hours.  Invalid input(s): FREET3 Anemia work up No results for input(s): VITAMINB12, FOLATE, FERRITIN, TIBC, IRON, RETICCTPCT in the last 72 hours. Microbiology Recent Results (from the past 240 hour(s))  SARS CORONAVIRUS 2 (TAT 6-24 HRS) Nasopharyngeal Nasopharyngeal Swab     Status: None   Collection Time: 01/24/19  9:49 PM   Specimen: Nasopharyngeal Swab  Result Value Ref Range Status   SARS Coronavirus 2 NEGATIVE NEGATIVE Final    Comment: (NOTE) SARS-CoV-2 target nucleic acids are NOT DETECTED. The SARS-CoV-2 RNA is generally detectable in upper and lower respiratory specimens during the acute phase of infection. Negative results do not preclude SARS-CoV-2 infection, do not rule out co-infections with other pathogens, and should not be used as the sole basis for treatment or other patient management decisions. Negative results must be combined with clinical observations, patient history, and epidemiological information. The expected result is Negative. Fact Sheet for Patients: SugarRoll.be Fact Sheet for Healthcare Providers: https://www.woods-mathews.com/ This test is not yet approved or cleared by the [place] FDA and  has been authorized for detection and/or diagnosis of SARS-CoV-2 by FDA under an Emergency Use Authorization (EUA). This EUA will remain  in effect (meaning this test can be used) for the duration of the COVID-19 declaration under Section 56 4(b)(1) of the Act, 21 U.S.C. section 360bbb-3(b)(1), unless the authorization is terminated or revoked sooner. Performed  at [facility], [place] [address] [place], [place] [address]   Culture, blood (routine x 2)     Status: None (Preliminary result)   Collection Time: 01/25/19  2:14 AM   Specimen: BLOOD RIGHT WRIST  Result Value Ref Range Status   Specimen Description   Final    BLOOD RIGHT WRIST Performed at [facility] [address] [place], [place] [address]    Special Requests   Final    BOTTLES DRAWN AEROBIC AND ANAEROBIC Blood Culture results may not be optimal due to an excessive volume of blood received in culture bottles Performed at [facility] [address], [place], [place] [address]    Culture   Final    NO GROWTH 4 DAYS Performed at [facility], [place] [address], [place], [place] [address]    Report Status PENDING  Incomplete  Culture, blood (routine x 2)     Status: None (Preliminary result)   Collection Time: 01/25/19  2:15 AM   Specimen: BLOOD LEFT FOREARM  Result Value Ref Range Status   Specimen Description   Final    BLOOD LEFT FOREARM Performed at [facility], [place] [address], [place], [place] [address]    Special Requests   Final    BOTTLES DRAWN AEROBIC AND ANAEROBIC Blood Culture adequate volume Performed at [facility] [address], [place], [place] [address]    Culture   Final    NO GROWTH 4 DAYS Performed at [facility], [place] [address] [place], [place] [address]    Report Status PENDING  Incomplete  MRSA PCR Screening     Status: None   Collection Time: 01/27/19  4:26 AM   Specimen: Nasal Mucosa; Nasopharyngeal  Result Value Ref Range Status   MRSA by PCR NEGATIVE NEGATIVE Final    Comment:        The GeneXpert MRSA Assay (FDA approved for NASAL specimens only), is one component of a comprehensive MRSA colonization surveillance program. It is not intended to diagnose MRSA infection nor to guide or monitor treatment for MRSA infections. Performed at [facility], [place] [address], [place],  [address]     Please note: You were cared for by a hospitalist during your hospital stay. Once you are discharged, your primary care physician will handle any further medical issues. Please note that NO REFILLS for any discharge medications will  be authorized once you are discharged, as it is imperative that you return to your primary care physician (or establish a relationship with a primary care physician if you do not have one) for your post hospital discharge needs so that they can reassess your need for medications and monitor your lab values.  Signed: Terrilee Croak  [facility] 01/29/2019, 2:13 PM

## 2019-01-29 NOTE — Progress Notes (Signed)
Subjective: No abdominal pain. Tolerating diet.  Objective: Vital signs in last 24 hours: Temp:  [97.5 F (36.4 C)-97.7 F (36.5 C)] 97.5 F (36.4 C) (12/05 0501) Pulse Rate:  [63-68] 64 (12/05 0501) Resp:  [16-20] 20 (12/05 0501) BP: (126-149)/(75-88) 128/75 (12/05 0501) SpO2:  [92 %-98 %] 98 % (12/05 0501) Weight change:  Last BM Date: 01/28/19  PE: GEN:  NAD ABD:  Soft, non-tender  Lab Results: CBC    Component Value Date/Time   WBC 11.6 (H) 01/28/2019 0352   RBC 3.31 (L) 01/28/2019 0352   HGB 10.7 (L) 01/28/2019 0352   HGB 13.5 06/02/2018 0841   HCT 33.3 (L) 01/28/2019 0352   HCT 40.1 06/02/2018 0841   PLT 292 01/28/2019 0352   PLT 277 06/02/2018 0841   MCV 100.6 (H) 01/28/2019 0352   MCV 96 06/02/2018 0841   MCH 32.3 01/28/2019 0352   MCHC 32.1 01/28/2019 0352   RDW 12.6 01/28/2019 0352   RDW 12.2 06/02/2018 0841   LYMPHSABS 0.7 01/28/2019 0352   LYMPHSABS 2.1 06/02/2018 0841   MONOABS 0.5 01/28/2019 0352   EOSABS 0.0 01/28/2019 0352   EOSABS 0.2 06/02/2018 0841   BASOSABS 0.0 01/28/2019 0352   BASOSABS 0.0 06/02/2018 0841   CMP     Component Value Date/Time   NA 138 01/29/2019 0333   NA 140 12/22/2018 0830   K 4.3 01/29/2019 0333   CL 109 01/29/2019 0333   CO2 22 01/29/2019 0333   GLUCOSE 148 (H) 01/29/2019 0333   BUN 27 (H) 01/29/2019 0333   BUN 20 12/22/2018 0830   CREATININE 0.88 01/29/2019 0333   CREATININE 1.07 01/29/2017 1242   CALCIUM 8.0 (L) 01/29/2019 0333   PROT 5.2 (L) 01/29/2019 0333   PROT 6.5 12/22/2018 0830   ALBUMIN 2.4 (L) 01/29/2019 0333   ALBUMIN 4.1 12/22/2018 0830   AST 25 01/29/2019 0333   ALT 48 (H) 01/29/2019 0333   ALKPHOS 255 (H) 01/29/2019 0333   BILITOT 0.6 01/29/2019 0333   BILITOT 0.6 12/22/2018 0830   GFRNONAA >60 01/29/2019 0333   GFRAA >60 01/29/2019 0333   Assessment:  1.  Bile duct stones, extracted via ERCP. 2.  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 3.  Elevated  LFTs, likely from #1 and #2 above, improving. 4.  Newly diagnosed atrial fibrillation.  Plan:  1.  Advance diet as tolerated to low fat diet. 2.  No blood thinners for 5 days s/p biliary sphincterotomy. 3.  OK to discharge home from GI perspective; [facility] will arrange outpatient follow-up (needs follow up LFTs, needs repeat ERCP for stent removal +/- further stone extraction in 6-8 weeks). 4.  [facility] will sign-off; please call with questions; thank you for the consultation.   [name] 01/29/2019, 2:48 PM   Cell [phone] If no answer or after 5 PM call [phone]

## 2019-01-30 LAB — CULTURE, BLOOD (ROUTINE X 2)
Culture: NO GROWTH
Culture: NO GROWTH
Special Requests: ADEQUATE

## 2019-01-31 LAB — SURGICAL PATHOLOGY

## 2019-02-04 ENCOUNTER — Other Ambulatory Visit: Payer: Self-pay | Admitting: *Deleted

## 2019-02-04 NOTE — Patient Outreach (Signed)
[facility] EMMI General Discharge for day#4 02/03/19, Lost interest in things?- yes,  RN CM spoke with pt to address and pt reports " that's not correct, I'm doing fine"  No new concerns voiced.  Jacqlyn Larsen [facility], [place] Coordinator [phone]

## 2019-02-07 ENCOUNTER — Other Ambulatory Visit: Payer: Self-pay

## 2019-02-07 ENCOUNTER — Telehealth: Payer: Self-pay | Admitting: *Deleted

## 2019-02-07 ENCOUNTER — Encounter: Payer: Self-pay | Admitting: Family Medicine

## 2019-02-07 ENCOUNTER — Ambulatory Visit (INDEPENDENT_AMBULATORY_CARE_PROVIDER_SITE_OTHER): Payer: Medicare Other | Admitting: Family Medicine

## 2019-02-07 VITALS — BP 130/70 | HR 71 | Temp 97.8°F | Resp 20 | Ht 70.0 in | Wt 179.0 lb

## 2019-02-07 DIAGNOSIS — N400 Enlarged prostate without lower urinary tract symptoms: Secondary | ICD-10-CM | POA: Diagnosis not present

## 2019-02-07 DIAGNOSIS — E1129 Type 2 diabetes mellitus with other diabetic kidney complication: Secondary | ICD-10-CM

## 2019-02-07 DIAGNOSIS — I4891 Unspecified atrial fibrillation: Secondary | ICD-10-CM | POA: Diagnosis not present

## 2019-02-07 DIAGNOSIS — R350 Frequency of micturition: Secondary | ICD-10-CM | POA: Diagnosis not present

## 2019-02-07 DIAGNOSIS — R809 Proteinuria, unspecified: Secondary | ICD-10-CM

## 2019-02-07 DIAGNOSIS — R05 Cough: Secondary | ICD-10-CM | POA: Diagnosis not present

## 2019-02-07 DIAGNOSIS — R059 Cough, unspecified: Secondary | ICD-10-CM

## 2019-02-07 MED ORDER — BENZONATATE 200 MG PO CAPS: 200.0000 mg | ORAL_CAPSULE | Freq: Three times a day (TID) | ORAL | 0 refills | Status: DC | PRN

## 2019-02-07 NOTE — Patient Instructions (Signed)
The cough that you have after eating and when laying down may either be related to postnasal drainage or reflux.   Take the Mucinex DM twice daily (which has cough suppressant and an expectorant). Take either Prilosec OTC or Nexium OTC once daily with dinner.  If neither of these medications help with your cough, you can start taking the benzonatate. If your cough doesn't resolve, or if it worsens, you will need further evaluation of your cough.  Try and limit the sodium in your diet, and keep your legs elevated when just seated for long periods of time at home (to help reduce the swelling in your ankles).  Be sure to let us know if you start having any shortness of breath (either with exertion, or when laying flat).  As far as your frequent urination, you may want to call over to [facility] and schedule a follow up appointment with them to further address this.  It doesn't sound as though you have infection--the urine at the hospital was normal, and you have no pain.  The frequent urination can be related to the enlarged prostate (that the urologist treats), or related to an overactive bladder.  If you weren't happy with the last provider you saw there, you can ask to see someone else.  Please schedule another visit if your symptoms persist or worsen, or if new symptoms develop.

## 2019-02-07 NOTE — Progress Notes (Signed)
Done

## 2019-02-07 NOTE — Telephone Encounter (Signed)
Patient called and was asking for something for his cough. He said he has a dry cough and still urinating quite a bit but otherwise feels fine post surgery. He has had the cough since before he was admitted on 11/30 and he said he told doctors there but nothing was done about it. He has to quarantine for 14 days since coming home last Monday. He has used an entire bottle of OTC cough syrup since coming home and it has not helped. Had COVID test 11/30. I did save him the 1:30 spot if you want to do a virtual or phone call appt and he was agreeable-please advise, thanks.

## 2019-02-07 NOTE — Progress Notes (Signed)
Start time: 1:31 End time: 2:01  Virtual Visit via Telephone Note  I connected with Andre Walker on 02/07/19 at  1:30 PM EST by telephone and verified that I am speaking with the correct person using two identifiers.  Location: Patient: home Provider: office   I discussed the limitations, risks, security and privacy concerns of performing an evaluation and management service by telephone and the availability of in person appointments. I also discussed with the patient that there may be a patient responsible charge related to this service. The patient expressed understanding and agreed to proceed.   History of Present Illness:  Chief Complaint  Patient presents with  . Cough    x 14 days. Had negative COVID when admitted 11/30. He has used an entire bottle of OTC cough syrup since Monday. He also has frequent urination at night. No burning or other symptoms-just waking up 4-5 times a night.    Patient presents today to discuss cough x 2 weeks. He reports cough started prior to being in the hospital.  He coughs when he lays down at night, and also after eating and drinking some.  Nonproductive. He took a whole bottle of OTC dextromethorphan, helped temporarily. Denies heartburn or belching. Denies runny nose, sniffling, sneezing, denies PND. He takes claritin daily.  Denies any shortness of breath. He reports some ankle swelling. Tries walking in his room, but limited access due to quarantine (needing to quarantine since being in hospital, not walking to dining room). Swelling started during hospitalization, per pt. No problems laying flat. Denies changes to his diet Has been on lisinopril for many years without cough.  He is also complaining about frequent urination.  He is up 4-5 times/night.  He states this also started prior to being in the hospital (and was initially treated by [facility] for UTI, when he first presented with abdominal/back pain and fever). He denies dysuria,  back pain, hematuria or fever. Urine appears "normal", medium in color. He has h/o BPH, under the care of urologist, and takes hytrin. He reports he saw Dr. Gilford Rile (at [place]) in 09/2018)---we do not have any records from this visit, last notes in chart were from 09/2017.  12/1 urine dip: SG 1.043, protein and glu. Negative leuks  He was hospitalized 11/30-12/5 with acute cholecystitis, s/p cholecystectomy. He also had new onset atrial fib. Per chart, they were waiting to see Dr. Martinique as outpatient to decide on anticoagulation (couldn't get started in hospital due to his surgery).  He has f/u with [facility] later this month  PMH, PSH, SH reviewed  Outpatient Encounter Medications as of 02/07/2019  Medication Sig  . aspirin 81 MG tablet Take 81 mg by mouth daily.    . Calcium Carbonate-Vitamin D (CALCIUM 600+D HIGH POTENCY) 600-400 MG-UNIT per tablet Take 1 tablet by mouth 2 (two) times daily.   . Coenzyme Q10 (COQ10) 100 MG CAPS Take 1 capsule by mouth daily.    Marland Kitchen glucosamine-chondroitin 500-400 MG tablet Take 2 tablets by mouth daily.   Marland Kitchen glucose blood test strip 1 each by Other route daily. Use as instructed  . lisinopril (ZESTRIL) 5 MG tablet Take 1 tablet (5 mg total) by mouth daily.  Marland Kitchen loratadine (CLARITIN) 10 MG tablet Take 10 mg by mouth daily.  . metFORMIN (GLUCOPHAGE-XR) 500 MG 24 hr tablet TAKE 2 TABLETS BY MOUTH ONCE DAILY WITH BREAKFAST (Patient taking differently: Take 1,000 mg by mouth daily with breakfast. )  . Multiple Vitamins-Minerals (MULTIVITAMIN WITH MINERALS)  tablet Take 1 tablet by mouth daily.    . Omega-3 Fatty Acids (FISH OIL PO) Take 1,600 mg by mouth daily.  Glory Rosebush DELICA LANCETS [id number] MISC 1 each by Does not apply route daily.  . simvastatin (ZOCOR) 20 MG tablet Take 1 tablet (20 mg total) by mouth at bedtime.  Marland Kitchen terazosin (HYTRIN) 5 MG capsule Take 1 capsule by mouth at bedtime  . acetaminophen (TYLENOL) 325 MG tablet Take 650 mg by mouth every 6 (six) hours  as needed for mild pain, moderate pain or headache.   . benzonatate (TESSALON) 200 MG capsule Take 1 capsule (200 mg total) by mouth 3 (three) times daily as needed.   No facility-administered encounter medications on file as of 02/07/2019.   (NOT taking benzonatate prior to today's visit)  Allergies  Allergen Reactions  . Penicillins Rash    Has patient had a PCN reaction causing immediate rash, facial/tongue/throat swelling, SOB or lightheadedness with hypotension: No Has patient had a PCN reaction causing severe rash involving mucus membranes or skin necrosis: No Has patient had a PCN reaction that required hospitalization: No Has patient had a PCN reaction occurring within the last 10 years: No If all of the above answers are "NO", then may proceed with Cephalosporin use.    ROS:  No fever, chills. Bowels are normal.  Denies nausea, vomiting. Normal smell and taste. No shortness of breath. No chest pain. Edema per HPI.  Nocturia per HPI. Sugar yesterday was 145, also has seen 116. See HPI   Observations/Objective:  BP 130/70   Pulse 71   Temp 97.8 F (36.6 C) (Tympanic)   Resp 20   Ht 5\' 10"  (1.778 m)   Wt 179 lb (81.2 kg)   SpO2 94%   BMI 25.68 kg/m   Wt Readings from Last 3 Encounters:  02/07/19 179 lb (81.2 kg)  01/25/19 189 lb 6 oz (85.9 kg)  12/27/18 187 lb (84.8 kg)   Patient is alert, oriented, in no distress.  No coughing during visit, speaking easily and comfortably. Exam is limited due to telephone nature of the visit.  Assessment and Plan:  Cough - change to Mucinex DM BID, start Prilosec or Nexium OTC once daily to cover for poss GERD.  Tessalon prn if these measures not effective - Plan: benzonatate (TESSALON) 200 MG capsule  Urinary frequency - and nocturia--known BPH.  Rec he f/u with urologist.  Nothing to suggest acute infection at this time  Benign prostatic hyperplasia, unspecified whether lower urinary tract symptoms present -  Rec he f/u with  urologist.  Nothing to suggest acute infection at this time  New onset a-fib [facility]) - with recent hospitalization. I do not see that he has f/u scheduled with cardiology--will ensure he does (esp due to report of edema)  Diabetes mellitus with microalbuminuria ([place]) - sugars are okay at home.  No changes made   Follow Up Instructions:    I discussed the assessment and treatment plan with the patient. The patient was provided an opportunity to ask questions and all were answered. The patient agreed with the plan and demonstrated an understanding of the instructions.   The patient was advised to call back or seek an in-person evaluation if the symptoms worsen or if the condition fails to improve as anticipated.  I provided 30 minutes of non-face-to-face time during this encounter.   Vikki Ports, MD

## 2019-02-09 ENCOUNTER — Other Ambulatory Visit: Payer: Self-pay | Admitting: *Deleted

## 2019-02-09 ENCOUNTER — Other Ambulatory Visit: Payer: Self-pay | Admitting: Family Medicine

## 2019-02-09 DIAGNOSIS — I4891 Unspecified atrial fibrillation: Secondary | ICD-10-CM

## 2019-02-09 DIAGNOSIS — E1129 Type 2 diabetes mellitus with other diabetic kidney complication: Secondary | ICD-10-CM

## 2019-02-09 DIAGNOSIS — E119 Type 2 diabetes mellitus without complications: Secondary | ICD-10-CM

## 2019-02-09 DIAGNOSIS — R809 Proteinuria, unspecified: Secondary | ICD-10-CM

## 2019-02-09 DIAGNOSIS — E7800 Pure hypercholesterolemia, unspecified: Secondary | ICD-10-CM

## 2019-03-07 ENCOUNTER — Other Ambulatory Visit: Payer: Self-pay | Admitting: Gastroenterology

## 2019-03-10 NOTE — Progress Notes (Signed)
Cardiology Office Note   Date:  03/17/2019   ID:  Andre Walker, DOB [birth year], MRN [id number]  PCP:  Andre Ohara, MD  Cardiologist:   Andre Glaus Martinique, MD   Chief Complaint  Patient presents with  . Atrial Fibrillation      History of Present Illness: Andre Walker is a 86 y.o. male who is seen at the request of Dr Andre Walker for evaluation of atrial fibrillation. He has a history of DM and HLD. No known history of cardiac disease. He was admitted in early December with acute cholecystitis. He was noted to be in atrial fibrillation with controlled rate. He underwent cholecystectomy as well as ERCP with sphincterotomy and stent. Was not started on anticoagulation due to surgery. Rate remained controlled on no rate control medication. Echo was done showing EF 50-55%. Moderate LAE. No significant valvular disease. [place] vasc score is 3. Last prior Ecg in [facility] was in 2014.   He denies any palpitations, dizziness, chest pain or SOB. He is back exercising regularly and notes no limitation. Appetite is good. He is scheduled to go back next month to have stent removed.    Past Medical History:  Diagnosis Date  . Actinic keratosis    Dr. Allyson Walker  . BPH (benign prostatic hyperplasia)    controlled on Hytrin  . Colon polyps    Dr. Watt Walker  . Diabetes mellitus 2004  . ED (erectile dysfunction)   . Hearing loss    high frequency  . Hiatal hernia    small, noted on EGD 10/2010  . Melanoma (Andre Walker)    right arm  . Microalbuminuria    on ACEI for microalbuminuria (does NOT have HTN)  . Pure hypercholesterolemia 2006  . Traumatic partial tear of biceps tendon 01/2009   "popeye" injury on right  . Wears glasses   . Wears partial dentures    lower    Past Surgical History:  Procedure Laterality Date  . APPENDECTOMY  1955  . BILIARY STENT PLACEMENT N/A 01/28/2019   Procedure: BILIARY STENT PLACEMENT;  Surgeon: Andre Silence, MD;  Location: [facility];  Service: Endoscopy;  Laterality: N/A;  .  CATARACT EXTRACTION Bilateral R 05/07/15, L 05/21/15   Dr. Katy Walker  . CHOLECYSTECTOMY N/A 01/27/2019   Procedure: LAPAROSCOPIC CHOLECYSTECTOMY WITH INTRAOPEREATIVE CHOLANGIOGRAM;  Surgeon: Andre Overall, MD;  Location: [facility]S;  Service: General;  Laterality: N/A;  . COLONOSCOPY  11/09, 10/29/10   Dr. Watt Walker  . ERCP N/A 01/28/2019   Procedure: ENDOSCOPIC RETROGRADE CHOLANGIOPANCREATOGRAPHY (ERCP);  Surgeon: Andre Silence, MD;  Location: [facility];  Service: Endoscopy;  Laterality: N/A;  . ESOPHAGOGASTRODUODENOSCOPY  10/29/10   Dr. Watt Walker  . INGUINAL HERNIA REPAIR Right 1989  . MELANOMA EXCISION  10/08/10   R upper arm, Andre Walker Done  . MELANOMA EXCISION  03/16/2012   Procedure: MELANOMA EXCISION;  Surgeon: Andre Earls, MD;  Location: [place];  Service: General;  Laterality: Right;  excision of melanoma of right arm  . REMOVAL OF STONES  01/28/2019   Procedure: REMOVAL OF STONES;  Surgeon: Andre Silence, MD;  Location: [facility];  Service: Endoscopy;;  . SPHINCTEROTOMY  01/28/2019   Procedure: SPHINCTEROTOMY;  Surgeon: Andre Silence, MD;  Location: [facility];  Service: Endoscopy;;  . TOTAL KNEE ARTHROPLASTY Right 2012  . TOTAL KNEE ARTHROPLASTY Left 2005     Current Outpatient Medications  Medication Sig Dispense Refill  . acetaminophen (TYLENOL) 325 MG tablet Take 650 mg by mouth every 6 (six)  hours as needed for mild pain, moderate pain or headache.     Marland Kitchen apixaban (ELIQUIS) 5 MG TABS tablet Take 1 tablet (5 mg total) by mouth 2 (two) times daily. 180 tablet 3  . aspirin 81 MG tablet Take 81 mg by mouth daily.      . benzonatate (TESSALON) 200 MG capsule Take 1 capsule (200 mg total) by mouth 3 (three) times daily as needed. 30 capsule 0  . Calcium Carbonate-Vitamin D (CALCIUM 600+D HIGH POTENCY) 600-400 MG-UNIT per tablet Take 1 tablet by mouth 2 (two) times daily.     . Coenzyme Q10 (COQ10) 100 MG CAPS Take 1 capsule by mouth daily.      Marland Kitchen glucosamine-chondroitin  500-400 MG tablet Take 2 tablets by mouth daily.     Marland Kitchen glucose blood test strip 1 each by Other route daily. Use as instructed 100 each 2  . lisinopril (ZESTRIL) 5 MG tablet Take 1 tablet by mouth once daily 90 tablet 0  . loratadine (CLARITIN) 10 MG tablet Take 10 mg by mouth daily.    . metFORMIN (GLUCOPHAGE-XR) 500 MG 24 hr tablet TAKE 2 TABLETS BY MOUTH ONCE DAILY WITH BREAKFAST 180 tablet 0  . Multiple Vitamins-Minerals (MULTIVITAMIN WITH MINERALS) tablet Take 1 tablet by mouth daily.      . Omega-3 Fatty Acids (FISH OIL PO) Take 1,600 mg by mouth daily.    Glory Rosebush DELICA LANCETS 99991111 MISC 1 each by Does not apply route daily. 100 each 2  . simvastatin (ZOCOR) 20 MG tablet TAKE 1 TABLET BY MOUTH AT BEDTIME 90 tablet 0  . terazosin (HYTRIN) 5 MG capsule Take 1 capsule by mouth at bedtime 90 capsule 0   No current facility-administered medications for this visit.    Allergies:   Penicillins    Social History:  The patient  reports that he has never smoked. He has never used smokeless tobacco. He reports current alcohol use of about 2.0 - 3.0 standard drinks of alcohol per week. He reports that he does not use drugs.   Family History:  The patient's family history includes Cancer in his paternal uncle; Diabetes in his brother, sister, and sister; Heart disease in his brother and paternal uncle; Hyperlipidemia in his brother; Hypertension in his brother; Stroke in his brother.    ROS:  Please see the history of present illness.   Otherwise, review of systems are positive for none.   All other systems are reviewed and negative.    PHYSICAL EXAM: VS:  BP 122/69   Pulse 84   Temp (!) 96.3 F (35.7 C)   Ht 5\' 11"  (1.803 m)   Wt 188 lb (85.3 kg)   SpO2 98%   BMI 26.22 kg/m  , BMI Body mass index is 26.22 kg/m. GEN: Well nourished, well developed, in no acute distress  HEENT: normal  Neck: no JVD, carotid bruits, or masses Cardiac: IRRR; no murmurs, rubs, or gallops,no edema    Respiratory:  clear to auscultation bilaterally, normal work of breathing GI: soft, nontender, nondistended, + BS MS: no deformity or atrophy  Skin: warm and dry, no rash Neuro:  Strength and sensation are intact Psych: euthymic mood, full affect   EKG:  EKG is ordered today. The ekg ordered today demonstrates Afib with rate 84. Otherwise normal.    Recent Labs: 01/24/2019: TSH 2.541 01/28/2019: Hemoglobin 10.7; Platelets 292 01/29/2019: ALT 48; BUN 27; Creatinine, Ser 0.88; Magnesium 1.6; Potassium 4.3; Sodium 138    Lipid  Panel    Component Value Date/Time   CHOL 93 (L) 12/22/2018 0830   TRIG 63 12/22/2018 0830   HDL 40 12/22/2018 0830   CHOLHDL 2.3 12/22/2018 0830   CHOLHDL 3.0 11/26/2016 0745   VLDL 15 11/07/2015 0728   LDLCALC 39 12/22/2018 0830   LDLCALC 57 11/26/2016 0745      Wt Readings from Last 3 Encounters:  03/17/19 188 lb (85.3 kg)  02/07/19 179 lb (81.2 kg)  01/25/19 189 lb 6 oz (85.9 kg)      Other studies Reviewed: Additional studies/ records that were reviewed today include:   Ecg dated 01/27/19 showed Afib with occ PVC. Low voltage. Rate 75. I have personally reviewed and interpreted this study.   Echo 01/25/19:IMPRESSIONS    1. Left ventricular ejection fraction, by visual estimation, is 50 to 55%. The left ventricle has low normal function. There is no left ventricular hypertrophy.  2. Left ventricular diastolic parameters are indeterminate.  3. Global right ventricle has normal systolic function.The right ventricular size is normal. No increase in right ventricular wall thickness.  4. Left atrial size was moderately dilated.  5. Right atrial size was normal.  6. The mitral valve is normal in structure. No evidence of mitral valve regurgitation. No evidence of mitral stenosis.  7. The tricuspid valve is normal in structure. Tricuspid valve regurgitation is mild.  8. The aortic valve is tricuspid. Aortic valve regurgitation is mild. Mild  aortic valve sclerosis without stenosis.  9. The pulmonic valve was grossly normal. Pulmonic valve regurgitation is mild. 10. Aortic dilatation noted. 11. There is mild dilatation of the aortic root measuring 39 mm. 12. Mildly elevated pulmonary artery systolic pressure. 13. The inferior vena cava is normal in size with greater than 50% respiratory variability, suggesting right atrial pressure of 3 mmHg.    ASSESSMENT AND PLAN:  1.  Atrial fibrillation persistent. Rate is controlled on no rate control therapy. He is asymptomatic. I would recommend continued rate control only. Recommend anticoagulation with Eliquis 5 mg bid. RX sent in. Can hold 2 days prior to next GI procedure 2. DM type 2 3. S/p cholecystectomy and sphincterotomy  Current medicines are reviewed at length with the patient today.  The patient does not have concerns regarding medicines.  The following changes have been made:  See above.   Labs/ tests ordered today include:   Orders Placed This Encounter  Procedures  . EKG 12-Lead     Disposition:   FU with me in 6 months   Signed, Dacari Beckstrand Martinique, MD  03/17/2019 2:14 PM    [facility] [address], [place], [place], [address] Phone [phone], Fax [phone]

## 2019-03-17 ENCOUNTER — Ambulatory Visit (INDEPENDENT_AMBULATORY_CARE_PROVIDER_SITE_OTHER): Payer: Medicare Other | Admitting: Cardiology

## 2019-03-17 ENCOUNTER — Other Ambulatory Visit: Payer: Self-pay

## 2019-03-17 ENCOUNTER — Encounter: Payer: Self-pay | Admitting: Cardiology

## 2019-03-17 VITALS — BP 122/69 | HR 84 | Temp 96.3°F | Ht 71.0 in | Wt 188.0 lb

## 2019-03-17 DIAGNOSIS — I4819 Other persistent atrial fibrillation: Secondary | ICD-10-CM

## 2019-03-17 MED ORDER — APIXABAN 5 MG PO TABS: 5.0000 mg | ORAL_TABLET | Freq: Two times a day (BID) | ORAL | 3 refills | Status: DC

## 2019-03-21 ENCOUNTER — Telehealth: Payer: Self-pay | Admitting: *Deleted

## 2019-03-21 NOTE — Telephone Encounter (Signed)
Patient called and said Dr.Jordan put him on Eliquis 5mg  BID. He wanted to know if he is still supposed too take the 81mg  baby aspirin and he forgot to ask Dr. Martinique. Can you advise him?

## 2019-03-21 NOTE — Telephone Encounter (Signed)
I believe it is fine for him to remain on the aspirin.  (reviewed his visit note, no mention to stop it)

## 2019-03-21 NOTE — Telephone Encounter (Signed)
Patient advised.

## 2019-04-05 ENCOUNTER — Encounter (HOSPITAL_COMMUNITY): Payer: Self-pay | Admitting: Gastroenterology

## 2019-04-06 ENCOUNTER — Encounter (HOSPITAL_COMMUNITY): Payer: Self-pay | Admitting: Gastroenterology

## 2019-04-06 NOTE — Progress Notes (Signed)
The following are in epic: Last office visit note Dr. Martinique 1/21 EKG 1/21 ECHO12/20 CXR 11/20

## 2019-04-08 ENCOUNTER — Other Ambulatory Visit (HOSPITAL_COMMUNITY)
Admission: RE | Admit: 2019-04-08 | Discharge: 2019-04-08 | Disposition: A | Discharge status: Home / Self Care | Payer: Medicare Other | Source: Ambulatory Visit | Attending: Gastroenterology | Admitting: Gastroenterology

## 2019-04-08 DIAGNOSIS — Z20822 Contact with and (suspected) exposure to covid-19: Secondary | ICD-10-CM | POA: Insufficient documentation

## 2019-04-08 DIAGNOSIS — Z01812 Encounter for preprocedural laboratory examination: Secondary | ICD-10-CM | POA: Insufficient documentation

## 2019-04-08 LAB — SARS CORONAVIRUS 2 (TAT 6-24 HRS): SARS Coronavirus 2: NEGATIVE

## 2019-04-11 NOTE — Anesthesia Preprocedure Evaluation (Addendum)
Anesthesia Evaluation  Patient identified by MRN, date of birth, ID band Patient awake    Reviewed: Allergy & Precautions, NPO status , Patient's Chart, lab work & pertinent test results  History of Anesthesia Complications Negative for: history of anesthetic complications  Airway Mallampati: I  TM Distance: >3 FB Neck ROM: Full    Dental  (+) Partial Upper, Missing, Dental Advisory Given, Chipped   Pulmonary  04/08/2019 SARS coronavirus NEG   breath sounds clear to auscultation       Cardiovascular hypertension, Pt. on medications (-) angina+ dysrhythmias Atrial Fibrillation  Rhythm:Irregular Rate:Normal  01/2019 ECHO: EF 50-55%, mild AI   Neuro/Psych negative neurological ROS     GI/Hepatic negative GI ROS, Choledocholithiasis   Endo/Other  diabetes (glu 137), Oral Hypoglycemic AgentsHypothyroidism   Renal/GU Renal InsufficiencyRenal disease     Musculoskeletal   Abdominal   Peds  Hematology Eliquis: last dose Friday   Anesthesia Other Findings   Reproductive/Obstetrics                            Anesthesia Physical Anesthesia Plan  ASA: III  Anesthesia Plan: General   Post-op Pain Management:    Induction: Intravenous  PONV Risk Score and Plan: 2 and Ondansetron and Dexamethasone  Airway Management Planned: Oral ETT  Additional Equipment:   Intra-op Plan:   Post-operative Plan: Extubation in OR  Informed Consent: I have reviewed the patients History and Physical, chart, labs and discussed the procedure including the risks, benefits and alternatives for the proposed anesthesia with the patient or authorized representative who has indicated his/her understanding and acceptance.     Dental advisory given  Plan Discussed with: CRNA and Surgeon  Anesthesia Plan Comments:        Anesthesia Quick Evaluation

## 2019-04-12 ENCOUNTER — Ambulatory Visit (HOSPITAL_COMMUNITY): Payer: Medicare Other

## 2019-04-12 ENCOUNTER — Encounter (HOSPITAL_COMMUNITY): Payer: Self-pay | Admitting: Gastroenterology

## 2019-04-12 ENCOUNTER — Encounter (HOSPITAL_COMMUNITY)
Admission: RE | Disposition: A | Discharge status: Home / Self Care | Payer: Self-pay | Source: Home / Self Care | Attending: Gastroenterology

## 2019-04-12 ENCOUNTER — Other Ambulatory Visit: Payer: Self-pay

## 2019-04-12 ENCOUNTER — Ambulatory Visit (HOSPITAL_COMMUNITY): Payer: Medicare Other | Admitting: Anesthesiology

## 2019-04-12 ENCOUNTER — Ambulatory Visit (HOSPITAL_COMMUNITY)
Admission: RE | Admit: 2019-04-12 | Discharge: 2019-04-12 | Disposition: A | Discharge status: Home / Self Care | Payer: Medicare Other | Attending: Gastroenterology | Admitting: Gastroenterology

## 2019-04-12 DIAGNOSIS — Z4689 Encounter for fitting and adjustment of other specified devices: Secondary | ICD-10-CM

## 2019-04-12 DIAGNOSIS — E119 Type 2 diabetes mellitus without complications: Secondary | ICD-10-CM | POA: Insufficient documentation

## 2019-04-12 DIAGNOSIS — Z4659 Encounter for fitting and adjustment of other gastrointestinal appliance and device: Secondary | ICD-10-CM | POA: Diagnosis not present

## 2019-04-12 DIAGNOSIS — Z85828 Personal history of other malignant neoplasm of skin: Secondary | ICD-10-CM | POA: Insufficient documentation

## 2019-04-12 DIAGNOSIS — I4891 Unspecified atrial fibrillation: Secondary | ICD-10-CM | POA: Diagnosis not present

## 2019-04-12 DIAGNOSIS — Z7901 Long term (current) use of anticoagulants: Secondary | ICD-10-CM | POA: Diagnosis not present

## 2019-04-12 DIAGNOSIS — Z7984 Long term (current) use of oral hypoglycemic drugs: Secondary | ICD-10-CM | POA: Diagnosis not present

## 2019-04-12 DIAGNOSIS — E7800 Pure hypercholesterolemia, unspecified: Secondary | ICD-10-CM | POA: Insufficient documentation

## 2019-04-12 DIAGNOSIS — Z96659 Presence of unspecified artificial knee joint: Secondary | ICD-10-CM | POA: Insufficient documentation

## 2019-04-12 DIAGNOSIS — I10 Essential (primary) hypertension: Secondary | ICD-10-CM | POA: Diagnosis not present

## 2019-04-12 DIAGNOSIS — Z85820 Personal history of malignant melanoma of skin: Secondary | ICD-10-CM | POA: Diagnosis not present

## 2019-04-12 DIAGNOSIS — K5710 Diverticulosis of small intestine without perforation or abscess without bleeding: Secondary | ICD-10-CM | POA: Insufficient documentation

## 2019-04-12 DIAGNOSIS — K838 Other specified diseases of biliary tract: Secondary | ICD-10-CM | POA: Insufficient documentation

## 2019-04-12 DIAGNOSIS — Z79899 Other long term (current) drug therapy: Secondary | ICD-10-CM | POA: Insufficient documentation

## 2019-04-12 DIAGNOSIS — Z7982 Long term (current) use of aspirin: Secondary | ICD-10-CM | POA: Diagnosis not present

## 2019-04-12 DIAGNOSIS — K8050 Calculus of bile duct without cholangitis or cholecystitis without obstruction: Secondary | ICD-10-CM | POA: Diagnosis present

## 2019-04-12 HISTORY — DX: Unspecified atrial fibrillation: I48.91

## 2019-04-12 HISTORY — DX: Calculus of gallbladder without cholecystitis without obstruction: K80.20

## 2019-04-12 HISTORY — DX: Presence of external hearing-aid: Z97.4

## 2019-04-12 HISTORY — PX: ERCP: SHX5425

## 2019-04-12 HISTORY — PX: SPHINCTEROTOMY: SHX5544

## 2019-04-12 HISTORY — DX: Diverticulosis of intestine, part unspecified, without perforation or abscess without bleeding: K57.90

## 2019-04-12 HISTORY — PX: STENT REMOVAL: SHX6421

## 2019-04-12 LAB — GLUCOSE, CAPILLARY: Glucose-Capillary: 137 mg/dL — ABNORMAL HIGH (ref 70–99)

## 2019-04-12 SURGERY — ERCP, WITH INTERVENTION IF INDICATED
Anesthesia: General

## 2019-04-12 MED ORDER — ROCURONIUM BROMIDE 10 MG/ML (PF) SYRINGE
PREFILLED_SYRINGE | INTRAVENOUS | Status: DC | PRN
  Administered 2019-04-12: 50 mg via INTRAVENOUS
  Administered 2019-04-12: 10 mg via INTRAVENOUS

## 2019-04-12 MED ORDER — LIDOCAINE 2% (20 MG/ML) 5 ML SYRINGE
INTRAMUSCULAR | Status: DC | PRN
  Administered 2019-04-12: 50 mg via INTRAVENOUS

## 2019-04-12 MED ORDER — FENTANYL CITRATE (PF) 100 MCG/2ML IJ SOLN
INTRAMUSCULAR | Status: AC
  Filled 2019-04-12: qty 2

## 2019-04-12 MED ORDER — PROPOFOL 10 MG/ML IV BOLUS
INTRAVENOUS | Status: DC | PRN
  Administered 2019-04-12: 40 mg via INTRAVENOUS
  Administered 2019-04-12: 100 mg via INTRAVENOUS

## 2019-04-12 MED ORDER — EPHEDRINE SULFATE-NACL 50-0.9 MG/10ML-% IV SOSY
PREFILLED_SYRINGE | INTRAVENOUS | Status: DC | PRN
  Administered 2019-04-12: 5 mg via INTRAVENOUS
  Administered 2019-04-12: 10 mg via INTRAVENOUS

## 2019-04-12 MED ORDER — DEXAMETHASONE SODIUM PHOSPHATE 10 MG/ML IJ SOLN
INTRAMUSCULAR | Status: DC | PRN
  Administered 2019-04-12: 8 mg via INTRAVENOUS

## 2019-04-12 MED ORDER — CIPROFLOXACIN IN D5W 400 MG/200ML IV SOLN
INTRAVENOUS | Status: AC
  Filled 2019-04-12: qty 200

## 2019-04-12 MED ORDER — INDOMETHACIN 50 MG RE SUPP
RECTAL | Status: AC
  Filled 2019-04-12: qty 2

## 2019-04-12 MED ORDER — SUGAMMADEX SODIUM 200 MG/2ML IV SOLN
INTRAVENOUS | Status: DC | PRN
  Administered 2019-04-12: 160 mg via INTRAVENOUS

## 2019-04-12 MED ORDER — LACTATED RINGERS IV SOLN
INTRAVENOUS | Status: DC
  Administered 2019-04-12: 1000 mL via INTRAVENOUS

## 2019-04-12 MED ORDER — FENTANYL CITRATE (PF) 100 MCG/2ML IJ SOLN
INTRAMUSCULAR | Status: DC | PRN
  Administered 2019-04-12: 50 ug via INTRAVENOUS

## 2019-04-12 MED ORDER — CIPROFLOXACIN IN D5W 400 MG/200ML IV SOLN
INTRAVENOUS | Status: DC | PRN
  Administered 2019-04-12: 400 mg via INTRAVENOUS

## 2019-04-12 MED ORDER — PROPOFOL 10 MG/ML IV BOLUS
INTRAVENOUS | Status: AC
  Filled 2019-04-12: qty 20

## 2019-04-12 MED ORDER — SODIUM CHLORIDE 0.9 % IV SOLN: INTRAVENOUS | Status: DC

## 2019-04-12 MED ORDER — PHENYLEPHRINE 40 MCG/ML (10ML) SYRINGE FOR IV PUSH (FOR BLOOD PRESSURE SUPPORT)
PREFILLED_SYRINGE | INTRAVENOUS | Status: DC | PRN
  Administered 2019-04-12 (×4): 120 ug via INTRAVENOUS

## 2019-04-12 MED ORDER — GLUCAGON HCL RDNA (DIAGNOSTIC) 1 MG IJ SOLR
INTRAMUSCULAR | Status: AC
  Filled 2019-04-12: qty 1

## 2019-04-12 MED ORDER — SODIUM CHLORIDE 0.9 % IV SOLN
INTRAVENOUS | Status: DC | PRN
  Administered 2019-04-12: 35 mL

## 2019-04-12 MED ORDER — ONDANSETRON HCL 4 MG/2ML IJ SOLN
INTRAMUSCULAR | Status: DC | PRN
  Administered 2019-04-12: 4 mg via INTRAVENOUS

## 2019-04-12 NOTE — Op Note (Addendum)
[facility] Patient Name: Andre Walker Procedure Date: 04/12/2019 MRN: [id number] Attending MD: Clarene Essex , MD Date of Birth: 1932-10-23 CSN: [id number] Age: 86 Admit Type: Outpatient Procedure:                ERCP Indications:              Evaluation and possible treatment of bile duct                            stone(s), Biliary stent removal Providers:                Clarene Essex, MD, Kary Kos, RN, Cleda Daub,                            RN, Theodora Blow, Technician Referring MD:              Medicines:                General Anesthesia Complications:            No immediate complications. Estimated Blood Loss:     Estimated blood loss: none. Procedure:                Pre-Anesthesia Assessment:                           - Prior to the procedure, a History and Physical                            was performed, and patient medications and                            allergies were reviewed. The patient's tolerance of                            previous anesthesia was also reviewed. The risks                            and benefits of the procedure and the sedation                            options and risks were discussed with the patient.                            All questions were answered, and informed consent                            was obtained. Prior Anticoagulants: The patient has                            taken Eliquis (apixaban), last dose was 4 days                            prior to procedure. ASA Grade Assessment: III - A  patient with severe systemic disease. After                            reviewing the risks and benefits, the patient was                            deemed in satisfactory condition to undergo the                            procedure.                           After obtaining informed consent, the scope was                            passed under direct vision. Throughout the                             procedure, the patient's blood pressure, pulse, and                            oxygen saturations were monitored continuously. The                            [id number] Olympus duodenoscope was                            introduced through the mouth, and used to inject                            contrast into and used to cannulate the bile duct.                            The ERCP was accomplished without difficulty. The                            patient tolerated the procedure well. Scope In: Scope Out: Findings:      One stent was removed from the common bile duct using a snare. The major       papilla was adjacent to a diverticulum. The major papilla was normal.       Deep selective cannulation was readily obtained on the first attempt and       there may have been some sludge or small stones seen on initial       injection so we proceeded with increasing the biliary sphincterotomy       which was extended with a Hydratome sphincterotome using ERBE       electrocautery. There was no post-sphincterotomy bleeding. We could get       the fully bowed sphincterotome easily in and out of the duct and there       seemed to be adequate biliary drainage and to discover objects, the       biliary tree was swept with an adjustable 12- 15 mm balloon starting at       the bifurcation. Sludge was swept from the duct on the initial  pull-through. Multiple balloon pull-through's were done using both size       balloons which both passed readily through the patent sphincterotomy       site. An occlusion cholangiogram was done which did not reveal any       residual abnormalities and again the balloon was pulled through the       patent sphincterotomy site without resistance and there was somewhat       sluggish but seemingly adequate biliary drainage and we elected to stop       the procedure at this point and there was no pancreatic duct injection       or wire advancement throughout  the procedure and the patient tolerated       the procedure well Impression:               - The major papilla was adjacent to a diverticulum.                           - The major papilla appeared normal.                           - One stent was removed from the common bile duct.                           - A biliary sphincterotomy was performed.                           - The biliary tree was swept and sludge was found. Moderate Sedation:      Not Applicable - Patient had care per Anesthesia. Recommendation:           - Clear liquid diet for 6 hours.                           - Resume Eliquis (apixaban) at prior dose tomorrow.                           - Return to GI clinic in 4 weeks.                           - Telephone GI clinic if symptomatic PRN. Procedure Code(s):        --- Professional ---                           [phone], Endoscopic retrograde                            cholangiopancreatography (ERCP); with removal of                            foreign body(s) or stent(s) from biliary/pancreatic                            duct(s)                           43264, Endoscopic retrograde  cholangiopancreatography (ERCP); with removal of                            calculi/debris from biliary/pancreatic duct(s)                           43262, Endoscopic retrograde                            cholangiopancreatography (ERCP); with                            sphincterotomy/papillotomy Diagnosis Code(s):        --- Professional ---                           [id number], Encounter for fitting and adjustment of                            other gastrointestinal appliance and device                           K80.50, Calculus of bile duct without cholangitis                            or cholecystitis without obstruction CPT copyright 2019 [employer]. All rights reserved. The codes documented in this report are preliminary and upon coder review may   be revised to meet current compliance requirements. Clarene Essex, MD 04/12/2019 8:44:05 AM This report has been signed electronically. Number of Addenda: 0

## 2019-04-12 NOTE — Progress Notes (Signed)
Andre Walker Athey 7:43 AM  Subjective: Patient has not had a problem since his cholecystectomy and his ERCP and stent placement except for a little bit of reflux helped by over-the-counter medicines and he has no new complaints  Objective: Vital signs stable afebrile no acute distress exam please see preassessment evaluation  Assessment: Questionable residual CBD stones  Plan: Okay to proceed with repeat ERCP and possible stone extraction and stent removal with anesthesia assistance  [facility]  office [phone] After 5PM or if no answer call [phone]

## 2019-04-12 NOTE — Transfer of Care (Signed)
Immediate Anesthesia Transfer of Care Note  Patient: Andre Walker  Procedure(s) Performed: ENDOSCOPIC RETROGRADE CHOLANGIOPANCREATOGRAPHY (ERCP) (N/A ) SPHINCTEROTOMY STENT REMOVAL  Patient Location: PACU  Anesthesia Type:General  Level of Consciousness: awake, alert  and oriented  Airway & Oxygen Therapy: Patient Spontanous Breathing and Patient connected to face mask oxygen  Post-op Assessment: Report given to RN, Post -op Vital signs reviewed and stable and Patient moving all extremities X 4  Post vital signs: Reviewed and stable  Last Vitals:  Vitals Value Taken Time  BP 123/54 04/12/19 0850  Temp    Pulse 71 04/12/19 0850  Resp 18 04/12/19 0850  SpO2 100 % 04/12/19 0850  Vitals shown include unvalidated device data.  Last Pain:  Vitals:   04/12/19 0651  TempSrc: Oral  PainSc: 0-No pain         Complications: No apparent anesthesia complications

## 2019-04-12 NOTE — Discharge Instructions (Signed)
Begin with clear liquid diet until 2 PM and if doing well may have soft solids the rest of the day and may advance diet tomorrow and activity level tomorrow if doing well and as long as no post procedure problem specifically abdominal pain fever yellow jaundice nausea vomiting or signs of bleeding may resume Eliquis tomorrow and call if question or problem otherwise if doing well may follow-up in 1 month  YOU HAD AN ENDOSCOPIC PROCEDURE TODAY: Refer to the procedure report and other information in the discharge instructions given to you for any specific questions about what was found during the examination. If this information does not answer your questions, please call [facility] office at [phone] to clarify.   YOU SHOULD EXPECT: Some feelings of bloating in the abdomen. Passage of more gas than usual. Walking can help get rid of the air that was put into your GI tract during the procedure and reduce the bloating. If you had a lower endoscopy (such as a colonoscopy or flexible sigmoidoscopy) you may notice spotting of blood in your stool or on the toilet paper. Some abdominal soreness may be present for a day or two, also.  DIET: Your first meal following the procedure should be a light meal and then it is ok to progress to your normal diet. A half-sandwich or bowl of soup is an example of a good first meal. Heavy or fried foods are harder to digest and may make you feel nauseous or bloated. Drink plenty of fluids but you should avoid alcoholic beverages for 24 hours. If you had a esophageal dilation, please see attached instructions for diet.    ACTIVITY: Your care partner should take you home directly after the procedure. You should plan to take it easy, moving slowly for the rest of the day. You can resume normal activity the day after the procedure however YOU SHOULD NOT DRIVE, use power tools, machinery or perform tasks that involve climbing or major physical exertion for 24 hours (because of the  sedation medicines used during the test).   SYMPTOMS TO REPORT IMMEDIATELY: A gastroenterologist can be reached at any hour. Please call [phone]  for any of the following symptoms:  . Following lower endoscopy (colonoscopy, flexible sigmoidoscopy) Excessive amounts of blood in the stool  Significant tenderness, worsening of abdominal pains  Swelling of the abdomen that is new, acute  Fever of 100 or higher  . Following upper endoscopy (EGD, EUS, ERCP, esophageal dilation) Vomiting of blood or coffee ground material  New, significant abdominal pain  New, significant chest pain or pain under the shoulder blades  Painful or persistently difficult swallowing  New shortness of breath  Black, tarry-looking or red, bloody stools  FOLLOW UP:  If any biopsies were taken you will be contacted by phone or by letter within the next 1-3 weeks. Call [phone]  if you have not heard about the biopsies in 3 weeks.  Please also call with any specific questions about appointments or follow up tests.

## 2019-04-12 NOTE — Anesthesia Postprocedure Evaluation (Signed)
Anesthesia Post Note  Patient: Andre Walker  Procedure(s) Performed: ENDOSCOPIC RETROGRADE CHOLANGIOPANCREATOGRAPHY (ERCP) (N/A ) SPHINCTEROTOMY STENT REMOVAL     Patient location during evaluation: Endoscopy Anesthesia Type: General Level of consciousness: awake and alert, oriented and patient cooperative Pain management: pain level controlled Vital Signs Assessment: post-procedure vital signs reviewed and stable Respiratory status: spontaneous breathing, nonlabored ventilation and respiratory function stable Cardiovascular status: blood pressure returned to baseline and stable Postop Assessment: no apparent nausea or vomiting and adequate PO intake Anesthetic complications: no    Last Vitals:  Vitals:   04/12/19 0900 04/12/19 0910  BP: (!) 112/58 (!) 115/59  Pulse: 75 77  Resp: 17 15  Temp:    SpO2: 95% 96%    Last Pain:  Vitals:   04/12/19 0910  TempSrc:   PainSc: 0-No pain                 Delmont Prosch,E. Alston Berrie

## 2019-04-12 NOTE — Anesthesia Procedure Notes (Signed)
Procedure Name: Intubation Date/Time: 04/12/2019 7:57 AM Performed by: Niel Hummer, CRNA Pre-anesthesia Checklist: Patient identified, Emergency Drugs available, Suction available and Patient being monitored Patient Re-evaluated:Patient Re-evaluated prior to induction Oxygen Delivery Method: Circle system utilized Preoxygenation: Pre-oxygenation with 100% oxygen Induction Type: IV induction Ventilation: Mask ventilation without difficulty Laryngoscope Size: Mac and 4 Grade View: Grade I Tube type: Oral Tube size: 7.5 mm Number of attempts: 1 Airway Equipment and Method: Stylet Placement Confirmation: ETT inserted through vocal cords under direct vision,  breath sounds checked- equal and bilateral and positive ETCO2 Secured at: 23 cm Tube secured with: Tape Dental Injury: Teeth and Oropharynx as per pre-operative assessment

## 2019-04-13 ENCOUNTER — Encounter: Payer: Self-pay | Admitting: *Deleted

## 2019-04-19 ENCOUNTER — Other Ambulatory Visit: Payer: Self-pay | Admitting: Family Medicine

## 2019-04-19 DIAGNOSIS — N400 Enlarged prostate without lower urinary tract symptoms: Secondary | ICD-10-CM

## 2019-04-19 NOTE — Telephone Encounter (Signed)
[facility] is requesting to fill pt terazosin. Please advise [facility]

## 2019-05-09 ENCOUNTER — Other Ambulatory Visit: Payer: Self-pay | Admitting: *Deleted

## 2019-05-09 ENCOUNTER — Other Ambulatory Visit: Payer: Self-pay | Admitting: Family Medicine

## 2019-05-09 DIAGNOSIS — E1129 Type 2 diabetes mellitus with other diabetic kidney complication: Secondary | ICD-10-CM

## 2019-05-09 DIAGNOSIS — E7800 Pure hypercholesterolemia, unspecified: Secondary | ICD-10-CM

## 2019-05-09 DIAGNOSIS — E119 Type 2 diabetes mellitus without complications: Secondary | ICD-10-CM

## 2019-05-09 MED ORDER — ONETOUCH VERIO VI STRP: ORAL_STRIP | 3 refills | Status: DC

## 2019-06-28 NOTE — Progress Notes (Signed)
Chief Complaint  Patient presents with  . Diabetes    fasting med check. Patient is having PT through [facility] for balance issues. Has 2 sessions left. Therapist asked if you would do a screening for scoliosis on him-felt a knot on his lumbar spine. Stanton Kidney also mentioned to me on the phone the other that his mouth moves a lot when he is just sitting there (like watching TV) wanted to see if you noticed.     Patient presents for routine med check.  Wife notices that his mouth moves a lot at rest (watching TV). Per pt, states just a "slight quiver" noted at the left side of his mouth. He has noticed a slight bit in the mirror, just intermittently. He thinks it may be "habit", isn't worried about it.  No other facial issues, numbness.  Therapist noted a knot on his lumbar spine, wanted to be checked for scoliosis. He denies any back pain at all.  He was last seen in December with 2 weeks of cough (treated with Mucinex DM, benzonatate and prilosec).  Cough completely resolved.  Diabetes follow-up: Blood sugars at home are running107-140 in the mornings. Denies hypoglycemia, polydipsia and polyuria.He reports compliance with metformin (1061m daily) and denies side effects. Last eye exam was in July, 2020, no retinopathy. Patient follows a low sugar diet and checks feet regularly without concerns. Denies numbness, tingling, burning or sores.  He has h/o microalbuminuria, and continues on lisinopril without side effects.Denies dizziness or cough. He is walking outside daily (weather-permitting). Goes to the gym 3-4 days/week, weights, bike.  Hyperlipidemia follow-up: Patient is reportedly following a low-fat, low cholesterol diet. Compliant with medications(simvastatin)and denies medication side effects. Lipids were at goal on last check. Lab Results  Component Value Date   CHOL 93 (L) 12/22/2018   HDL 40 12/22/2018   LDLCALC 39 12/22/2018   TRIG 63 12/22/2018   CHOLHDL 2.3  12/22/2018   He was found to be in atrial fibrillation when hospitalized with acute cholecystitis in 12/2018. He is s/p cholecystectomy and ERCP with sphincterotomy and stent (which was later removed). Was not started on anticoagulation at that time due to surgery, but was started on Eliquis by Dr. JMartiniquein 02/2019. His rate has remained controlled on no rate control medication. Echo was done showing EF 50-55%. Moderate LAE. No significant valvular disease.  He isn't aware of any heart-related symptoms--denies tachycardia, palpitations, chest pain, shortness of breath.  He has noticed easy bruising, but denies any bleeding. Denies any abdominal pain or GI complaints.  BPH: Under the care of [facility] (since getting admitted 01/2017, had UTI, AKI). Due to some obstructive symptoms, finasteride was added to his hytrin.He last saw urologist Dr. WGilford Rilein 09/2018 (though we haven't gotten records from [facility] since 09/2017).  He no longer takes the finasteride (not sure exactly why it was stopped, stopped over a year ago).  He reports doing well on Hytrin alone, with a normal stream, empties bladder well.Up 1-2x/night to void. He is due for refill on Hytrin.  He has had some borderline and mildly elevated TSH values in the past,never symptomatic. He denies fatigue, cold intolerance, hair/skin changes.He has had weight loss since decreased meal size/food intake since pandemic (food being delivered, limited portions). Lab Results  Component Value Date   TSH 2.541 01/24/2019    PMH, PSH, SH reviewed  Outpatient Encounter Medications as of 06/29/2019  Medication Sig  . apixaban (ELIQUIS) 5 MG TABS tablet Take 1 tablet (5  mg total) by mouth 2 (two) times daily.  Marland Kitchen aspirin EC 81 MG tablet Take 81 mg by mouth daily.  . Calcium Carbonate-Vitamin D (CALCIUM 600+D HIGH POTENCY) 600-400 MG-UNIT per tablet Take 1 tablet by mouth 2 (two) times daily.   . Coenzyme Q10 (COQ10) 100 MG CAPS Take 100  mg by mouth daily at 12 noon.   . Glucosamine-Chondroitin (COSAMIN DS PO) Take 1 tablet by mouth 2 (two) times daily. With lunch & supper  . glucose blood (ONETOUCH VERIO) test strip USE 1 STRIP TO CHECK GLUCOSE ONCE DAILY DX:E11.9  . lisinopril (ZESTRIL) 5 MG tablet Take 1 tablet by mouth once daily  . loratadine (CLARITIN) 10 MG tablet Take 10 mg by mouth daily.  . metFORMIN (GLUCOPHAGE-XR) 500 MG 24 hr tablet TAKE 2 TABLETS BY MOUTH ONCE DAILY WITH BREAKFAST  . Multiple Vitamins-Minerals (MULTIVITAMIN WITH MINERALS) tablet Take 1 tablet by mouth daily.    . Omega-3 Fatty Acids (FISH OIL PO) Take 1,600 mg by mouth daily with supper.   Glory Rosebush DELICA LANCETS 40H MISC 1 each by Does not apply route daily.  . simvastatin (ZOCOR) 20 MG tablet TAKE 1 TABLET BY MOUTH AT BEDTIME  . terazosin (HYTRIN) 5 MG capsule Take 1 capsule by mouth at bedtime  . acetaminophen (TYLENOL) 325 MG tablet Take 650 mg by mouth every 6 (six) hours as needed for mild pain, moderate pain or headache.   . [DISCONTINUED] benzonatate (TESSALON) 200 MG capsule Take 1 capsule (200 mg total) by mouth 3 (three) times daily as needed. (Patient not taking: Reported on 04/05/2019)   No facility-administered encounter medications on file as of 06/29/2019.   Allergies  Allergen Reactions  . Penicillins Rash    Has patient had a PCN reaction causing immediate rash, facial/tongue/throat swelling, SOB or lightheadedness with hypotension: No Has patient had a PCN reaction causing severe rash involving mucus membranes or skin necrosis: No Has patient had a PCN reaction that required hospitalization: No Has patient had a PCN reaction occurring within the last 10 years: No If all of the above answers are "NO", then may proceed with Cephalosporin use.     ROS: no fever, chills, URI symptoms.  Cough resolved.  No headaches, dizziness, chest pain, palpitations, shortness of breath.  No edema.  No numbness, tingling (rare in fingertips).   He denies joint pains. Urinary complaints are stable as per HPI. No bleeding, bruising, rash. Moods are good   PHYSICAL EXAM:  BP 122/62   Pulse 72   Temp 97.8 F (36.6 C) (Other (Comment))   Ht 5' 11"  (1.803 m)   Wt 190 lb (86.2 kg)   BMI 26.50 kg/m   Wt Readings from Last 3 Encounters:  06/29/19 190 lb (86.2 kg)  04/12/19 179 lb 0.2 oz (81.2 kg)  03/17/19 188 lb (85.3 kg)    Well developed, pleasant elderly male in no distress  HEENT: PERRL EOMI, conjunctiva and sclera are clear. Wearing mask due to COVID-19-pandemic. Mask pulled down to eval for twitching or abnormal movements, none were noted. Neck: no lymphadenopathy, thyromegaly or carotid bruit  Heart:  Somewhat irregularly irregular. No murmur Lungs: clear bilaterally  Abdomen: soft, nontender, no mass. Well-healed, small surgical scars. Back: no CVA tenderness or spinal tenderness. Significant curvature of the lumbar spine (reverse C curve, prominent spine on the R) Extremities: no edema, 2+ pulse  Skin: Bruising and purpura on R>L forearms, and noted on L shin. Psych: normal mood, affect, hygiene and grooming  Neuro: alert and oriented, normal gait. Cranial nerves are intact  Lab Results  Component Value Date   HGBA1C 6.2 (A) 06/29/2019    ASSESSMENT/PLAN:  Diabetes mellitus with microalbuminuria (HCC) - well controlled - Plan: HgB A1c, Comprehensive metabolic panel  Atrial fibrillation, unspecified type (HCC) - rate controlled, anticoagulated, asymptomatic  Pure hypercholesterolemia  Senile purpura ([place]) - on blood thinners, and elderly.  Reassured  Medication monitoring encounter - Plan: Comprehensive metabolic panel, CBC with Differential/Platelet  BPH (benign prostatic hyperplasia) - controlled with Hytrin. Sees urologist yearly in August. Asked to have them send records with next visit - Plan: terazosin (HYTRIN) 5 MG capsule  Anticoagulant long-term use - Plan: CBC with  Differential/Platelet  Scoliosis of lumbar spine, unspecified scoliosis type - asymptomatic, likely longstanding. Fairly significant in lumbar spine   c-met (nonfasting), CBC (lipids at goal on last check)  F/u as scheduled in November for AWV/med check (fasting, or order labs if wants labs prior) Will need A1c, c-met, CBC, lipid, TSH, urine microalb

## 2019-06-29 ENCOUNTER — Ambulatory Visit (INDEPENDENT_AMBULATORY_CARE_PROVIDER_SITE_OTHER): Payer: Medicare Other | Admitting: Family Medicine

## 2019-06-29 ENCOUNTER — Other Ambulatory Visit: Payer: Self-pay

## 2019-06-29 ENCOUNTER — Encounter: Payer: Self-pay | Admitting: Family Medicine

## 2019-06-29 VITALS — BP 122/62 | HR 72 | Temp 97.8°F | Ht 71.0 in | Wt 190.0 lb

## 2019-06-29 DIAGNOSIS — R809 Proteinuria, unspecified: Secondary | ICD-10-CM

## 2019-06-29 DIAGNOSIS — I4891 Unspecified atrial fibrillation: Secondary | ICD-10-CM | POA: Diagnosis not present

## 2019-06-29 DIAGNOSIS — N400 Enlarged prostate without lower urinary tract symptoms: Secondary | ICD-10-CM

## 2019-06-29 DIAGNOSIS — Z5181 Encounter for therapeutic drug level monitoring: Secondary | ICD-10-CM

## 2019-06-29 DIAGNOSIS — E039 Hypothyroidism, unspecified: Secondary | ICD-10-CM

## 2019-06-29 DIAGNOSIS — D692 Other nonthrombocytopenic purpura: Secondary | ICD-10-CM

## 2019-06-29 DIAGNOSIS — E7800 Pure hypercholesterolemia, unspecified: Secondary | ICD-10-CM | POA: Diagnosis not present

## 2019-06-29 DIAGNOSIS — E1129 Type 2 diabetes mellitus with other diabetic kidney complication: Secondary | ICD-10-CM | POA: Diagnosis not present

## 2019-06-29 DIAGNOSIS — Z7901 Long term (current) use of anticoagulants: Secondary | ICD-10-CM

## 2019-06-29 DIAGNOSIS — M419 Scoliosis, unspecified: Secondary | ICD-10-CM

## 2019-06-29 LAB — POCT GLYCOSYLATED HEMOGLOBIN (HGB A1C): Hemoglobin A1C: 6.2 % — AB (ref 4.0–5.6)

## 2019-06-29 MED ORDER — TERAZOSIN HCL 5 MG PO CAPS: 5.0000 mg | ORAL_CAPSULE | Freq: Every day | ORAL | 2 refills | Status: DC

## 2019-06-29 NOTE — Patient Instructions (Addendum)
When you see Dr. Gilford Rile in August, please have them send me a copy of their notes.  Continue your current medications.  Your diabetes is well controlled. Try not to gain any more weight (you are fine where you are).

## 2019-06-30 LAB — CBC WITH DIFFERENTIAL/PLATELET
Basophils Absolute: 0 10*3/uL (ref 0.0–0.2)
Basos: 1 %
EOS (ABSOLUTE): 0.2 10*3/uL (ref 0.0–0.4)
Eos: 3 %
Hematocrit: 41.4 % (ref 37.5–51.0)
Hemoglobin: 13.6 g/dL (ref 13.0–17.7)
Immature Grans (Abs): 0 10*3/uL (ref 0.0–0.1)
Immature Granulocytes: 0 %
Lymphocytes Absolute: 1.6 10*3/uL (ref 0.7–3.1)
Lymphs: 25 %
MCH: 31.9 pg (ref 26.6–33.0)
MCHC: 32.9 g/dL (ref 31.5–35.7)
MCV: 97 fL (ref 79–97)
Monocytes Absolute: 0.5 10*3/uL (ref 0.1–0.9)
Monocytes: 8 %
Neutrophils Absolute: 4 10*3/uL (ref 1.4–7.0)
Neutrophils: 63 %
Platelets: 258 10*3/uL (ref 150–450)
RBC: 4.26 x10E6/uL (ref 4.14–5.80)
RDW: 12.2 % (ref 11.6–15.4)
WBC: 6.4 10*3/uL (ref 3.4–10.8)

## 2019-06-30 LAB — COMPREHENSIVE METABOLIC PANEL
ALT: 20 IU/L (ref 0–44)
AST: 21 IU/L (ref 0–40)
Albumin/Globulin Ratio: 1.8 (ref 1.2–2.2)
Albumin: 4.4 g/dL (ref 3.6–4.6)
Alkaline Phosphatase: 94 IU/L (ref 39–117)
BUN/Creatinine Ratio: 22 (ref 10–24)
BUN: 28 mg/dL — ABNORMAL HIGH (ref 8–27)
Bilirubin Total: 0.5 mg/dL (ref 0.0–1.2)
CO2: 20 mmol/L (ref 20–29)
Calcium: 9.7 mg/dL (ref 8.6–10.2)
Chloride: 104 mmol/L (ref 96–106)
Creatinine, Ser: 1.3 mg/dL — ABNORMAL HIGH (ref 0.76–1.27)
GFR calc Af Amer: 58 mL/min/{1.73_m2} — ABNORMAL LOW (ref >59)
GFR calc non Af Amer: 50 mL/min/{1.73_m2} — ABNORMAL LOW (ref >59)
Globulin, Total: 2.4 g/dL (ref 1.5–4.5)
Glucose: 169 mg/dL — ABNORMAL HIGH (ref 65–99)
Potassium: 4.9 mmol/L (ref 3.5–5.2)
Sodium: 141 mmol/L (ref 134–144)
Total Protein: 6.8 g/dL (ref 6.0–8.5)

## 2019-08-05 ENCOUNTER — Other Ambulatory Visit: Payer: Self-pay | Admitting: Family Medicine

## 2019-08-05 DIAGNOSIS — E7800 Pure hypercholesterolemia, unspecified: Secondary | ICD-10-CM

## 2019-08-05 DIAGNOSIS — E119 Type 2 diabetes mellitus without complications: Secondary | ICD-10-CM

## 2019-08-15 ENCOUNTER — Other Ambulatory Visit: Payer: Self-pay | Admitting: Family Medicine

## 2019-08-15 DIAGNOSIS — E1129 Type 2 diabetes mellitus with other diabetic kidney complication: Secondary | ICD-10-CM

## 2019-08-28 IMAGING — CT CT RENAL STONE PROTOCOL
2 of 3 series · 16 of 46 positions shown, 18 images · non-contrast
Comparison: None.

CLINICAL DATA: Left flank pain, low grade fever, urinary frequency,
and urgency for past 2 days.

EXAM:
CT ABDOMEN AND PELVIS WITHOUT CONTRAST
TECHNIQUE: Multidetector CT imaging of the abdomen and pelvis was performed
following the standard protocol without IV contrast.

[Series 3: coronal · coronal · 0.79mm/px · 3 of 149 slices shown]
[im 50/149  soft-tissue]
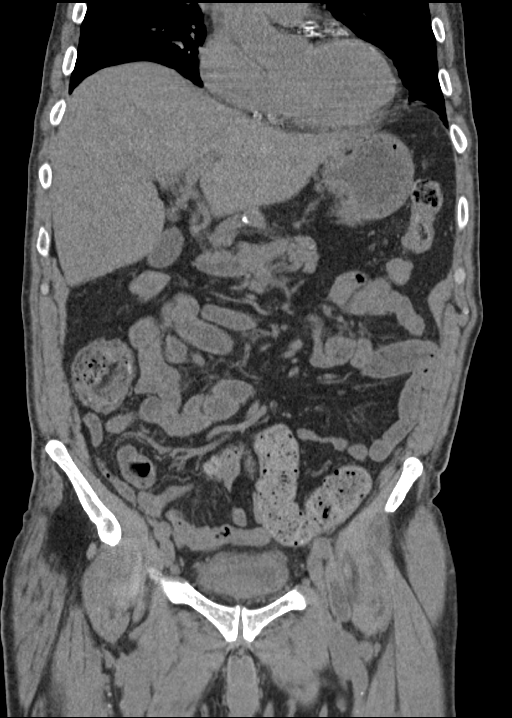
[im 66/149  soft-tissue]
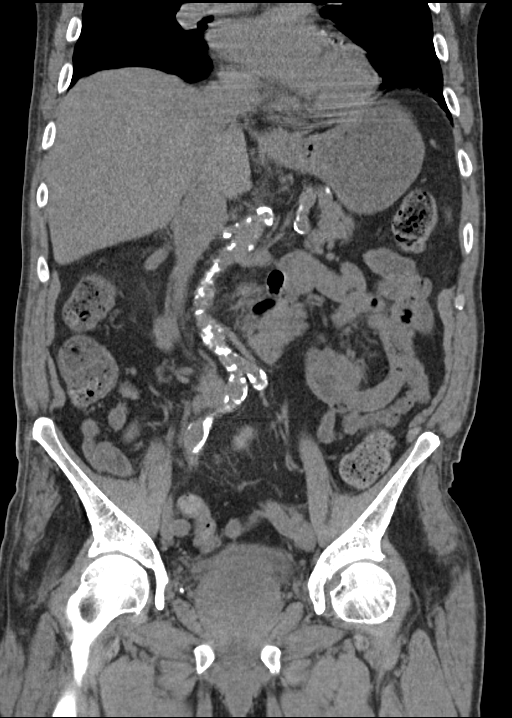
[im 83/149  soft-tissue]
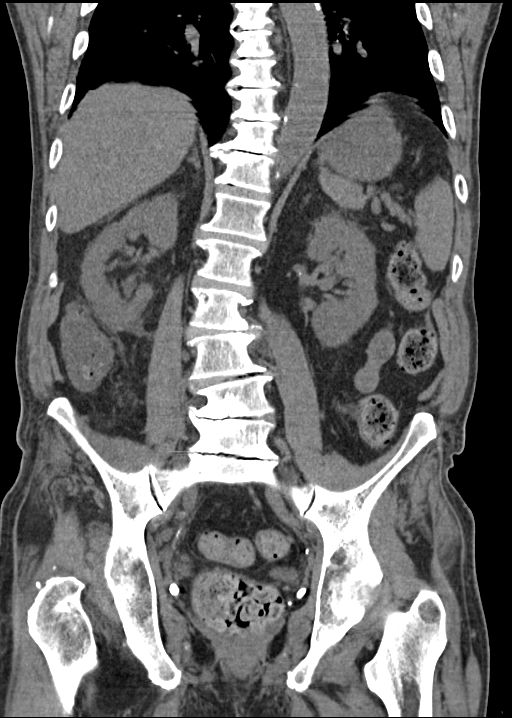

[Series 5: lung · axial · 0.84mm/px · z∈[+1569,+1711]mm · 13 of 83 slices shown, 15 images]
[im 6/83  soft-tissue]
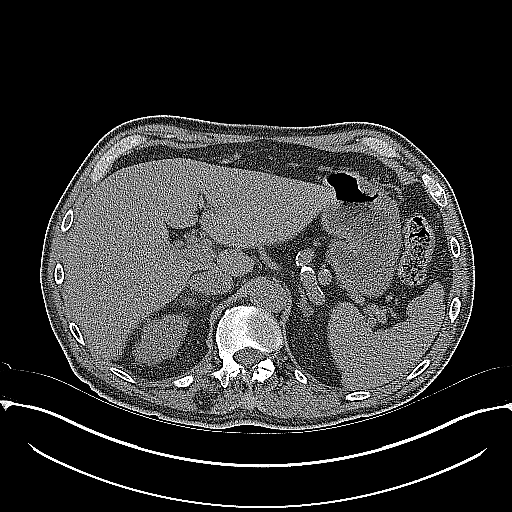
[im 6/83  bone]
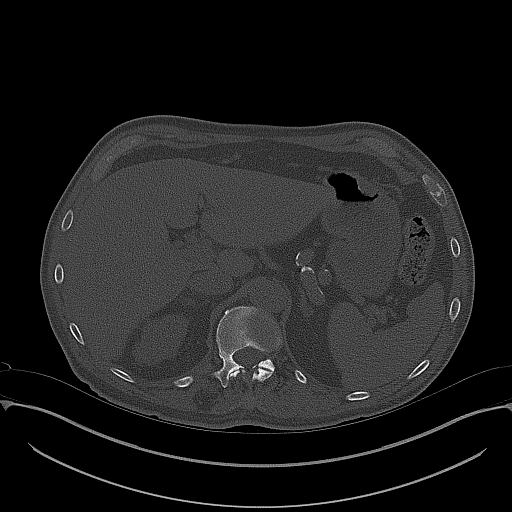
[im 11/83  soft-tissue]
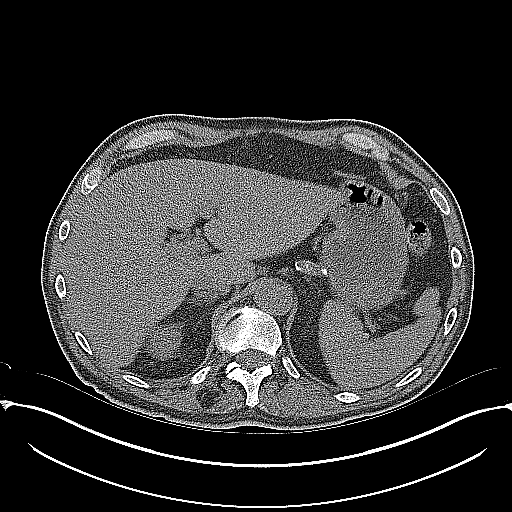
[im 16/83  soft-tissue]
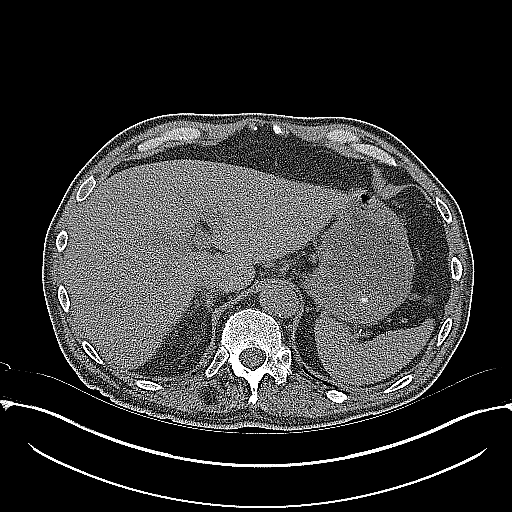
[im 24/83  soft-tissue]
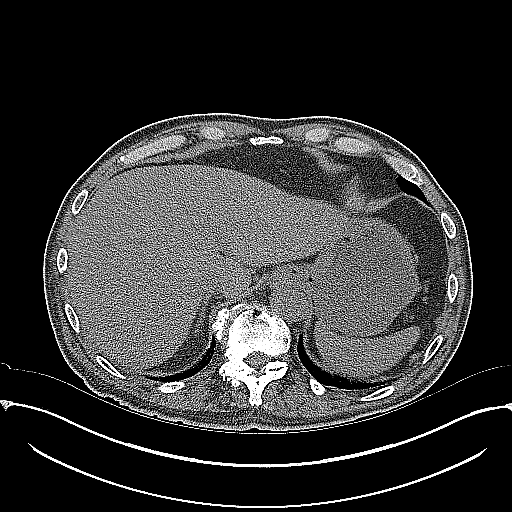
[im 30/83  soft-tissue]
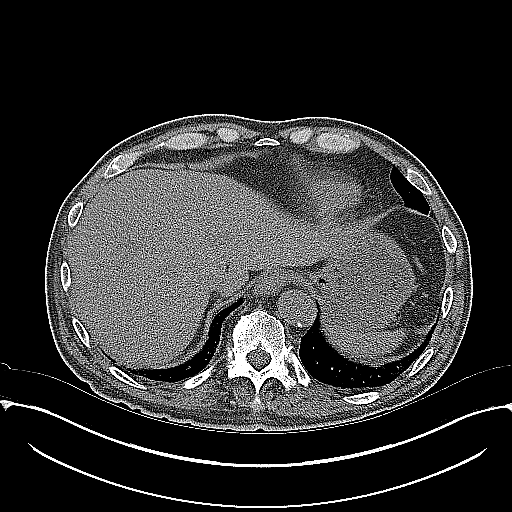
[im 35/83  soft-tissue]
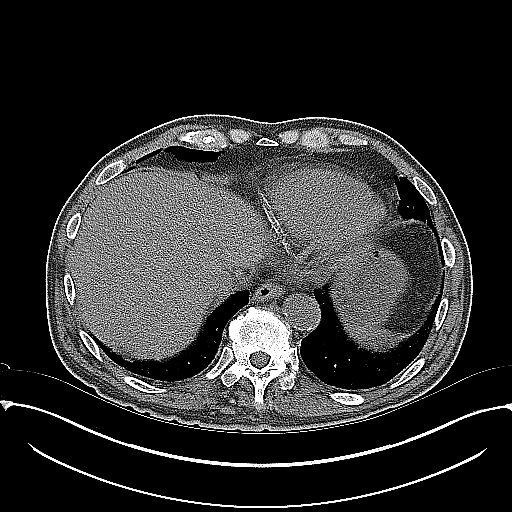
[im 43/83  soft-tissue]
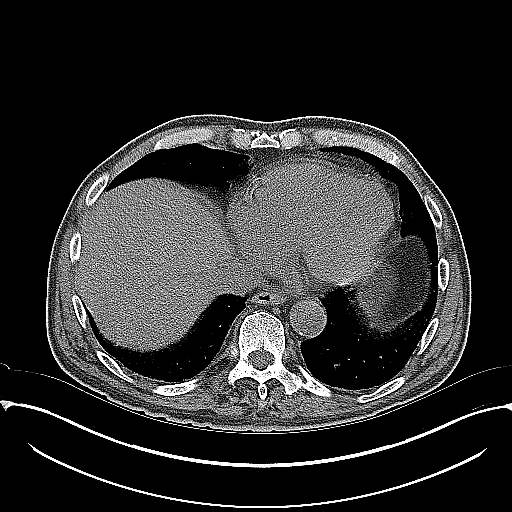
[im 48/83  soft-tissue]
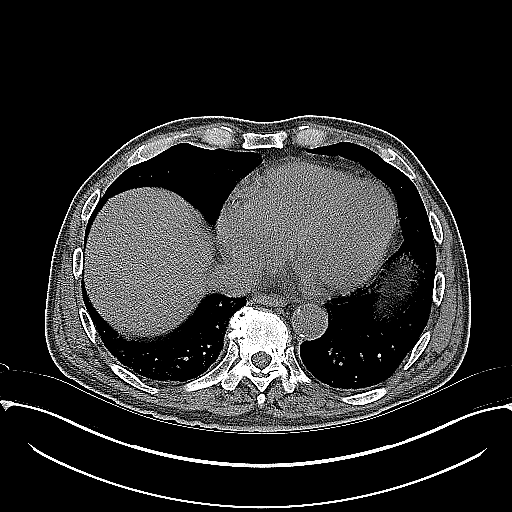
[im 53/83  soft-tissue]
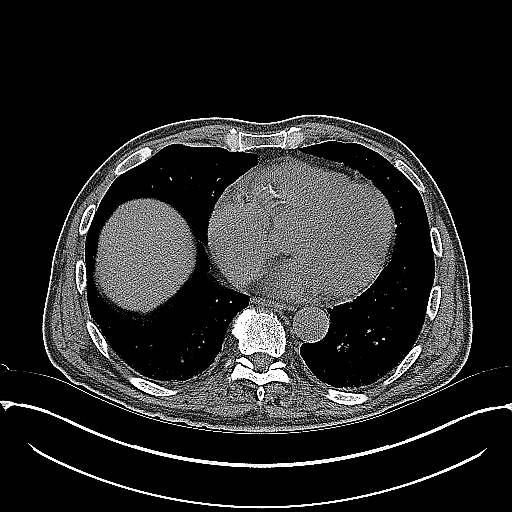
[im 53/83  bone]
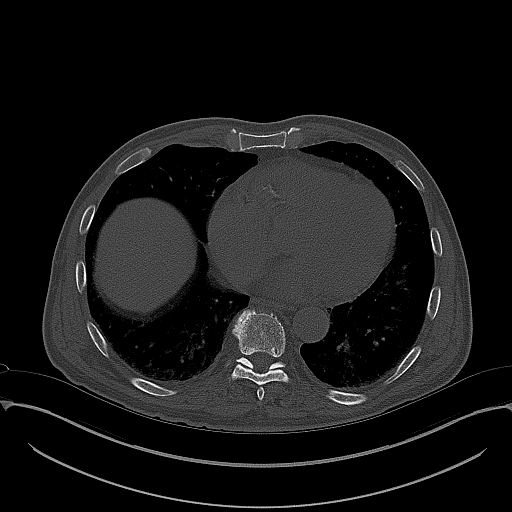
[im 59/83  soft-tissue]
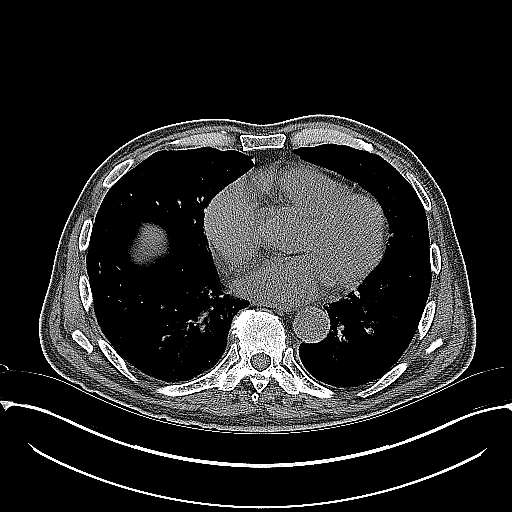
[im 67/83  soft-tissue]
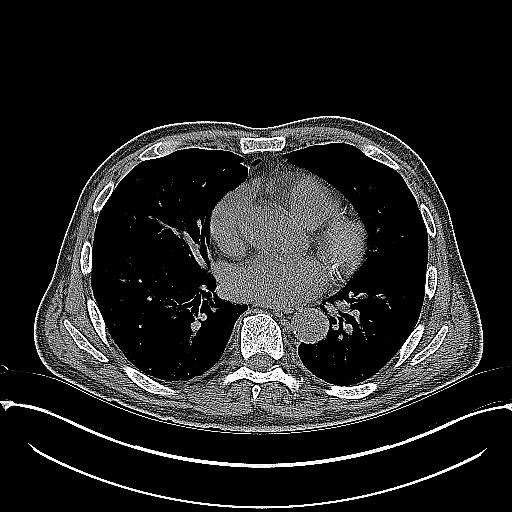
[im 72/83  soft-tissue]
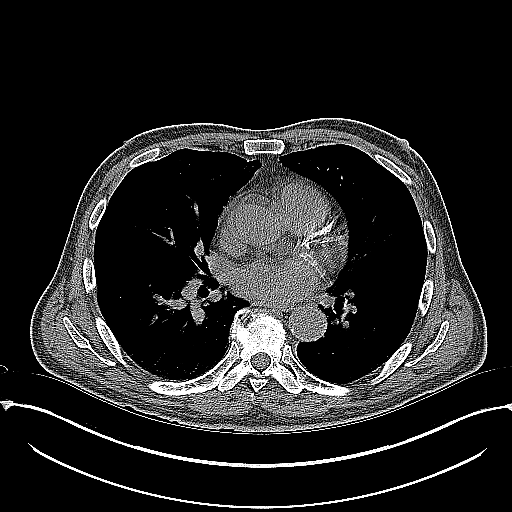
[im 77/83  soft-tissue]
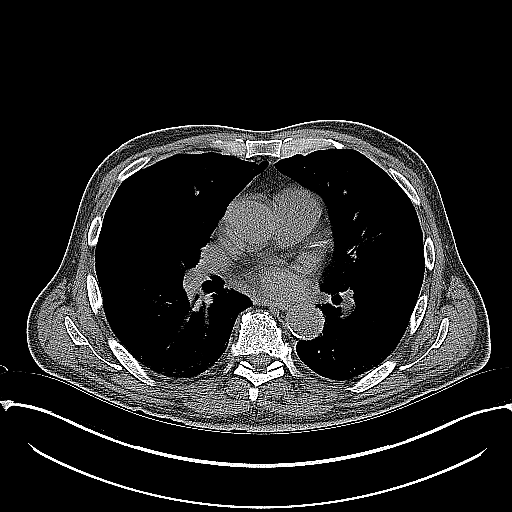

[16 of 46 positions shown; findings below may reference images not displayed]

FINDINGS: Lower chest: No acute findings.

Hepatobiliary: No mass visualized on this unenhanced exam. Multiple
tiny calcified gallstones noted, however there is no evidence of
cholecystitis or biliary ductal dilatation.

Pancreas: No mass or inflammatory process visualized on this
unenhanced exam.

Spleen:  Within normal limits in size.

Adrenals/Urinary tract: A few tiny 1-2 mm nonobstructing calculi are
seen in the left kidney. No evidence of ureteral calculi or
hydronephrosis. Parenchymal scarring seen in the upper pole of right
kidney.

Stomach/Bowel: No evidence of obstruction, inflammatory process, or
abnormal fluid collections.

Vascular/Lymphatic: No pathologically enlarged lymph nodes
identified. No evidence of abdominal aortic aneurysm. Aortic
atherosclerosis.

Reproductive: Mildly enlarged prostate with mass effect on bladder
base.

Other:  None.

Musculoskeletal: No suspicious bone lesions identified. Advanced
degenerative disc disease is seen at multiple levels in the lower
thoracic and lumbar spine.
IMPRESSION: Tiny nonobstructing left renal calculi. No evidence of ureteral
calculi, hydronephrosis, or other acute findings.

Cholelithiasis.  No radiographic evidence of cholecystitis.

Mildly enlarged prostate.

Aortic atherosclerosis.

## 2019-09-02 NOTE — Progress Notes (Signed)
Cardiology Office Note   Date:  09/06/2019   ID:  Andre Walker, DOB 02-11-1933, MRN [id number]  PCP:  Rita Ohara, MD  Cardiologist:   Margaurite Salido Martinique, MD   Chief Complaint  Patient presents with  . Atrial Fibrillation      History of Present Illness: Andre Walker is a 86 y.o. male who is seen for follow up  atrial fibrillation. He has a history of DM and HLD. No known history of cardiac disease. He was admitted in early December with acute cholecystitis. He was noted to be in atrial fibrillation with controlled rate. He underwent cholecystectomy as well as ERCP with sphincterotomy and stent. Was not started on anticoagulation due to surgery. Rate remained controlled on no rate control medication. Echo was done showing EF 50-55%. Moderate LAE. No significant valvular disease. [place] vasc score is 3. Last prior Ecg in NSR was in 2014. He was subsequently started on Eliquis as outpatient.   He denies any palpitations, dizziness, chest pain or SOB. He is back exercising regularly and notes no limitation. He plays golf.  No bleeding on Eliquis.    Past Medical History:  Diagnosis Date  . Actinic keratosis    Dr. Allyson Sabal  . Atrial fibrillation ([place])   . BPH (benign prostatic hyperplasia)    controlled on Hytrin  . Colon polyps    Dr. Watt Climes  . Diabetes mellitus 2004  . Diverticulosis   . ED (erectile dysfunction)   . Gallstones   . Hearing loss    high frequency  . Hiatal hernia    small, noted on EGD 10/2010  . LAE (left atrial enlargement) 01/2019   Moderate, Noted on ECHO  . Melanoma ([place])    right arm  . Microalbuminuria    on ACEI for microalbuminuria (does NOT have HTN)  . Pneumonia 01/2019   Bilateral, noted on CT  . Pure hypercholesterolemia 2006  . Traumatic partial tear of biceps tendon 01/2009   "popeye" injury on right  . Wears glasses   . Wears hearing aid in both ears   . Wears partial dentures    lower  . Wears partial dentures    lower    Past Surgical  History:  Procedure Laterality Date  . APPENDECTOMY  1955  . BILIARY STENT PLACEMENT N/A 01/28/2019   Procedure: BILIARY STENT PLACEMENT;  Surgeon: Arta Silence, MD;  Location: [facility];  Service: Endoscopy;  Laterality: N/A;  . CATARACT EXTRACTION Bilateral R 05/07/15, L 05/21/15   Dr. Katy Fitch  . CHOLECYSTECTOMY N/A 01/27/2019   Procedure: LAPAROSCOPIC CHOLECYSTECTOMY WITH INTRAOPEREATIVE CHOLANGIOGRAM;  Surgeon: Alphonsa Overall, MD;  Location: [facility];  Service: General;  Laterality: N/A;  . COLONOSCOPY  11/09, 10/29/10   Dr. Watt Climes  . ERCP N/A 01/28/2019   Procedure: ENDOSCOPIC RETROGRADE CHOLANGIOPANCREATOGRAPHY (ERCP);  Surgeon: Arta Silence, MD;  Location: [facility];  Service: Endoscopy;  Laterality: N/A;  . ERCP N/A 04/12/2019   Procedure: ENDOSCOPIC RETROGRADE CHOLANGIOPANCREATOGRAPHY (ERCP);  Surgeon: Clarene Essex, MD;  Location: [facility];  Service: Endoscopy;  Laterality: N/A;  stent removal  . ESOPHAGOGASTRODUODENOSCOPY  10/29/10   Dr. Watt Climes  . INGUINAL HERNIA REPAIR Right 1989  . MELANOMA EXCISION  10/08/10   R upper arm, Dr. Hassell Done  . MELANOMA EXCISION  03/16/2012   Procedure: MELANOMA EXCISION;  Surgeon: Pedro Earls, MD;  Location: [facility];  Service: General;  Laterality: Right;  excision of melanoma of right arm  . REMOVAL OF STONES  01/28/2019   Procedure: REMOVAL OF STONES;  Surgeon: Arta Silence, MD;  Location: [facility];  Service: Endoscopy;;  . SPHINCTEROTOMY  01/28/2019   Procedure: Joan Mayans;  Surgeon: Arta Silence, MD;  Location: [facility];  Service: Endoscopy;;  . Joan Mayans  04/12/2019   Procedure: Joan Mayans;  Surgeon: Clarene Essex, MD;  Location: [facility];  Service: Endoscopy;;  stone sludge  . STENT REMOVAL  04/12/2019   Procedure: STENT REMOVAL;  Surgeon: Clarene Essex, MD;  Location: [facility];  Service: Endoscopy;;  . TOTAL KNEE ARTHROPLASTY Right 2012  . TOTAL KNEE ARTHROPLASTY Left 2005     Current  Outpatient Medications  Medication Sig Dispense Refill  . acetaminophen (TYLENOL) 325 MG tablet Take 650 mg by mouth every 6 (six) hours as needed for mild pain, moderate pain or headache.     Marland Kitchen apixaban (ELIQUIS) 5 MG TABS tablet Take 1 tablet (5 mg total) by mouth 2 (two) times daily. 180 tablet 3  . aspirin EC 81 MG tablet Take 81 mg by mouth daily.    . Calcium Carbonate-Vitamin D (CALCIUM 600+D HIGH POTENCY) 600-400 MG-UNIT per tablet Take 1 tablet by mouth 2 (two) times daily.     . Coenzyme Q10 (COQ10) 100 MG CAPS Take 100 mg by mouth daily at 12 noon.     . Glucosamine-Chondroitin (COSAMIN DS PO) Take 1 tablet by mouth 2 (two) times daily. With lunch & supper    . glucose blood (ONETOUCH VERIO) test strip USE 1 STRIP TO CHECK GLUCOSE ONCE DAILY DX:E11.9 100 each 3  . lisinopril (ZESTRIL) 5 MG tablet Take 1 tablet by mouth once daily 90 tablet 0  . loratadine (CLARITIN) 10 MG tablet Take 10 mg by mouth daily.    . metFORMIN (GLUCOPHAGE-XR) 500 MG 24 hr tablet TAKE 2 TABLETS BY MOUTH ONCE DAILY WITH BREAKFAST 180 tablet 0  . Multiple Vitamins-Minerals (MULTIVITAMIN WITH MINERALS) tablet Take 1 tablet by mouth daily.      . Omega-3 Fatty Acids (FISH OIL PO) Take 1,600 mg by mouth daily with supper.     Glory Rosebush DELICA LANCETS 09U MISC 1 each by Does not apply route daily. 100 each 2  . simvastatin (ZOCOR) 20 MG tablet TAKE 1 TABLET BY MOUTH AT BEDTIME 90 tablet 0  . terazosin (HYTRIN) 5 MG capsule Take 1 capsule (5 mg total) by mouth at bedtime. 90 capsule 2   No current facility-administered medications for this visit.    Allergies:   Penicillins    Social History:  The patient  reports that he has never smoked. He has never used smokeless tobacco. He reports current alcohol use of about 2.0 - 3.0 standard drinks of alcohol per week. He reports that he does not use drugs.   Family History:  The patient's family history includes Cancer in his paternal uncle; Diabetes in his brother,  sister, and sister; Heart disease in his brother and paternal uncle; Hyperlipidemia in his brother; Hypertension in his brother; Stroke in his brother.    ROS:  Please see the history of present illness.   Otherwise, review of systems are positive for none.   All other systems are reviewed and negative.    PHYSICAL EXAM: VS:  BP 115/61   Pulse 73   Temp (!) 96.9 F (36.1 C)   Ht 5\' 9"  (1.753 m)   Wt 194 lb 9.6 oz (88.3 kg)   SpO2 93%   BMI 28.74 kg/m  , BMI Body mass index is 28.74  kg/m. GEN: Well nourished, well developed, in no acute distress  HEENT: normal  Neck: no JVD, carotid bruits, or masses Cardiac: IRRR; no murmurs, rubs, or gallops,no edema  Respiratory:  clear to auscultation bilaterally, normal work of breathing GI: soft, nontender, nondistended, + BS MS: no deformity or atrophy  Skin: warm and dry, no rash Neuro:  Strength and sensation are intact Psych: euthymic mood, full affect   EKG:  EKG is not ordered today  Recent Labs: 01/24/2019: TSH 2.541 01/29/2019: Magnesium 1.6 06/29/2019: ALT 20; BUN 28; Creatinine, Ser 1.30; Hemoglobin 13.6; Platelets 258; Potassium 4.9; Sodium 141    Lipid Panel    Component Value Date/Time   CHOL 93 (L) 12/22/2018 0830   TRIG 63 12/22/2018 0830   HDL 40 12/22/2018 0830   CHOLHDL 2.3 12/22/2018 0830   CHOLHDL 3.0 11/26/2016 0745   VLDL 15 11/07/2015 0728   LDLCALC 39 12/22/2018 0830   LDLCALC 57 11/26/2016 0745      Wt Readings from Last 3 Encounters:  09/06/19 194 lb 9.6 oz (88.3 kg)  06/29/19 190 lb (86.2 kg)  04/12/19 179 lb 0.2 oz (81.2 kg)      Other studies Reviewed: Additional studies/ records that were reviewed today include:    Echo 01/25/19:IMPRESSIONS    1. Left ventricular ejection fraction, by visual estimation, is 50 to 55%. The left ventricle has low normal function. There is no left ventricular hypertrophy.  2. Left ventricular diastolic parameters are indeterminate.  3. Global right  ventricle has normal systolic function.The right ventricular size is normal. No increase in right ventricular wall thickness.  4. Left atrial size was moderately dilated.  5. Right atrial size was normal.  6. The mitral valve is normal in structure. No evidence of mitral valve regurgitation. No evidence of mitral stenosis.  7. The tricuspid valve is normal in structure. Tricuspid valve regurgitation is mild.  8. The aortic valve is tricuspid. Aortic valve regurgitation is mild. Mild aortic valve sclerosis without stenosis.  9. The pulmonic valve was grossly normal. Pulmonic valve regurgitation is mild. 10. Aortic dilatation noted. 11. There is mild dilatation of the aortic root measuring 39 mm. 12. Mildly elevated pulmonary artery systolic pressure. 13. The inferior vena cava is normal in size with greater than 50% respiratory variability, suggesting right atrial pressure of 3 mmHg.    ASSESSMENT AND PLAN:  1.  Atrial fibrillation persistent. Rate is controlled on no rate control therapy. He is asymptomatic. I would recommend continued rate control only. On Eliquis 5 mg bid. 2. DM type 2 A1c 6.2%.  3. S/p cholecystectomy and sphincterotomy resolved.   Current medicines are reviewed at length with the patient today.  The patient does not have concerns regarding medicines.  The following changes have been made:  See above.   Labs/ tests ordered today include:   No orders of the defined types were placed in this encounter.    Disposition:   FU with me in 6 months   Signed, Graden Hoshino Martinique, MD  09/06/2019 [place] [address] [place], [place], [address] Phone [phone], Fax [phone]

## 2019-09-06 ENCOUNTER — Encounter: Payer: Self-pay | Admitting: Cardiology

## 2019-09-06 ENCOUNTER — Ambulatory Visit (INDEPENDENT_AMBULATORY_CARE_PROVIDER_SITE_OTHER): Payer: Medicare Other | Admitting: Cardiology

## 2019-09-06 ENCOUNTER — Other Ambulatory Visit: Payer: Self-pay

## 2019-09-06 VITALS — BP 115/61 | HR 73 | Temp 96.9°F | Ht 69.0 in | Wt 194.6 lb

## 2019-09-06 DIAGNOSIS — I4819 Other persistent atrial fibrillation: Secondary | ICD-10-CM | POA: Diagnosis not present

## 2019-09-12 ENCOUNTER — Ambulatory Visit: Payer: No Typology Code available for payment source | Admitting: Cardiology

## 2019-09-28 LAB — HM DIABETES EYE EXAM

## 2019-10-03 ENCOUNTER — Encounter: Payer: Self-pay | Admitting: *Deleted

## 2019-10-14 ENCOUNTER — Encounter: Payer: Self-pay | Admitting: Family Medicine

## 2019-11-04 ENCOUNTER — Other Ambulatory Visit: Payer: Self-pay | Admitting: Family Medicine

## 2019-11-04 DIAGNOSIS — E119 Type 2 diabetes mellitus without complications: Secondary | ICD-10-CM

## 2019-11-04 DIAGNOSIS — E7800 Pure hypercholesterolemia, unspecified: Secondary | ICD-10-CM

## 2019-11-08 ENCOUNTER — Encounter: Payer: Self-pay | Admitting: Family Medicine

## 2019-11-11 ENCOUNTER — Other Ambulatory Visit: Payer: Self-pay | Admitting: Family Medicine

## 2019-11-11 DIAGNOSIS — E1129 Type 2 diabetes mellitus with other diabetic kidney complication: Secondary | ICD-10-CM

## 2019-11-17 ENCOUNTER — Other Ambulatory Visit: Payer: Self-pay

## 2019-11-17 ENCOUNTER — Other Ambulatory Visit (INDEPENDENT_AMBULATORY_CARE_PROVIDER_SITE_OTHER): Payer: Medicare Other

## 2019-11-17 DIAGNOSIS — Z23 Encounter for immunization: Secondary | ICD-10-CM | POA: Diagnosis not present

## 2019-11-25 ENCOUNTER — Ambulatory Visit (INDEPENDENT_AMBULATORY_CARE_PROVIDER_SITE_OTHER): Payer: Medicare Other | Admitting: Family Medicine

## 2019-11-25 ENCOUNTER — Other Ambulatory Visit: Payer: Self-pay

## 2019-11-25 VITALS — BP 142/84 | HR 53 | Temp 96.6°F | Wt 200.6 lb

## 2019-11-25 DIAGNOSIS — S0001XA Abrasion of scalp, initial encounter: Secondary | ICD-10-CM

## 2019-11-25 NOTE — Progress Notes (Signed)
   Subjective:    Patient ID: Andre Walker, male    DOB: 02-10-1933, 86 y.o.   MRN: [id number]  HPI He injured the mid scalp area earlier today at home.  He did experience some slight bleeding but no loss of consciousness.  Apparently he hit it on a ceiling fan.Marland Kitchen  He is on Eliquis and aspirin. Tdap given in 2012  Review of Systems     Objective:   Physical Exam Alert and in no distress.  4 cm area of abrasion noted to the mid parietal occipital area.       Assessment & Plan:  Abrasion of scalp, initial encounter The wound was cleaned and dressed.  No further intervention needed.  He will need a Tdap again next year.

## 2020-01-11 ENCOUNTER — Other Ambulatory Visit: Payer: Medicare Other

## 2020-01-11 ENCOUNTER — Other Ambulatory Visit: Payer: Self-pay

## 2020-01-11 DIAGNOSIS — D692 Other nonthrombocytopenic purpura: Secondary | ICD-10-CM

## 2020-01-11 DIAGNOSIS — I4891 Unspecified atrial fibrillation: Secondary | ICD-10-CM

## 2020-01-11 DIAGNOSIS — Z7901 Long term (current) use of anticoagulants: Secondary | ICD-10-CM

## 2020-01-11 DIAGNOSIS — E7800 Pure hypercholesterolemia, unspecified: Secondary | ICD-10-CM

## 2020-01-11 DIAGNOSIS — R809 Proteinuria, unspecified: Secondary | ICD-10-CM

## 2020-01-11 DIAGNOSIS — Z5181 Encounter for therapeutic drug level monitoring: Secondary | ICD-10-CM

## 2020-01-12 LAB — COMPREHENSIVE METABOLIC PANEL
ALT: 21 IU/L (ref 0–44)
AST: 19 IU/L (ref 0–40)
Albumin/Globulin Ratio: 1.9 (ref 1.2–2.2)
Albumin: 4.4 g/dL (ref 3.6–4.6)
Alkaline Phosphatase: 95 IU/L (ref 44–121)
BUN/Creatinine Ratio: 20 (ref 10–24)
BUN: 23 mg/dL (ref 8–27)
Bilirubin Total: 0.5 mg/dL (ref 0.0–1.2)
CO2: 22 mmol/L (ref 20–29)
Calcium: 9.6 mg/dL (ref 8.6–10.2)
Chloride: 102 mmol/L (ref 96–106)
Creatinine, Ser: 1.13 mg/dL (ref 0.76–1.27)
GFR calc Af Amer: 68 mL/min/{1.73_m2} (ref >59)
GFR calc non Af Amer: 59 mL/min/{1.73_m2} — ABNORMAL LOW (ref >59)
Globulin, Total: 2.3 g/dL (ref 1.5–4.5)
Glucose: 127 mg/dL — ABNORMAL HIGH (ref 65–99)
Potassium: 5 mmol/L (ref 3.5–5.2)
Sodium: 140 mmol/L (ref 134–144)
Total Protein: 6.7 g/dL (ref 6.0–8.5)

## 2020-01-12 LAB — LIPID PANEL
Chol/HDL Ratio: 2.7 ratio (ref 0.0–5.0)
Cholesterol, Total: 116 mg/dL (ref 100–199)
HDL: 43 mg/dL (ref >39)
LDL Chol Calc (NIH): 57 mg/dL (ref 0–99)
Triglycerides: 81 mg/dL (ref 0–149)
VLDL Cholesterol Cal: 16 mg/dL (ref 5–40)

## 2020-01-12 LAB — TSH: TSH: 7.69 u[IU]/mL — ABNORMAL HIGH (ref 0.450–4.500)

## 2020-01-12 LAB — CBC WITH DIFFERENTIAL/PLATELET
Basophils Absolute: 0.1 10*3/uL (ref 0.0–0.2)
Basos: 1 %
EOS (ABSOLUTE): 0.3 10*3/uL (ref 0.0–0.4)
Eos: 4 %
Hematocrit: 41.4 % (ref 37.5–51.0)
Hemoglobin: 13.7 g/dL (ref 13.0–17.7)
Immature Grans (Abs): 0 10*3/uL (ref 0.0–0.1)
Immature Granulocytes: 0 %
Lymphocytes Absolute: 2.5 10*3/uL (ref 0.7–3.1)
Lymphs: 34 %
MCH: 31.6 pg (ref 26.6–33.0)
MCHC: 33.1 g/dL (ref 31.5–35.7)
MCV: 95 fL (ref 79–97)
Monocytes Absolute: 0.6 10*3/uL (ref 0.1–0.9)
Monocytes: 8 %
Neutrophils Absolute: 3.9 10*3/uL (ref 1.4–7.0)
Neutrophils: 53 %
Platelets: 249 10*3/uL (ref 150–450)
RBC: 4.34 x10E6/uL (ref 4.14–5.80)
RDW: 11.9 % (ref 11.6–15.4)
WBC: 7.3 10*3/uL (ref 3.4–10.8)

## 2020-01-12 LAB — MICROALBUMIN / CREATININE URINE RATIO
Creatinine, Urine: 64.2 mg/dL
Microalb/Creat Ratio: 20 mg/g creat (ref 0–29)
Microalbumin, Urine: 12.7 ug/mL

## 2020-01-12 LAB — HEMOGLOBIN A1C
Est. average glucose Bld gHb Est-mCnc: 148 mg/dL
Hgb A1c MFr Bld: 6.8 % — ABNORMAL HIGH (ref 4.8–5.6)

## 2020-01-15 NOTE — Progress Notes (Signed)
Chief Complaint  Patient presents with   Annual Exam    MWV both ankles swollen just noticed last night    Andre Walker is a 86 y.o. male who presents for annual wellness visit and follow-up on chronic medical conditions.  He had labs done prior to his visit (see below.)   He noticed some ankle swelling last night. Denies any increase in salt intake.  Denies any shortness of breath.  Diabetes follow-up: Blood sugars at home are running103-135 fasting, not checked at other times. Denies hypoglycemia, polydipsia and polyuria.He reports compliance with metformin ER (1000mg  daily) and denies side effects. Last eye exam was in 09/2019, no retinopathy. Patient follows a low sugar diet and checks feet regularly without concerns. He would like to go back to the podiatrist (previously saw the one on [address])--the thickened great toes are bothering him.  Wondering if he needs to have them removed again (had removed and was told they wouldn't grow back). Denies numbness, tingling, burning or sores.  He has h/o microalbuminuria, and continues on lisinopril without side effects.Denies dizziness or cough.  Hyperlipidemia follow-up: Patient is reportedly following a low-fat, low cholesterol diet. Compliant with medications(simvastatin)and denies medication side effects. See below for recent labs.  Atrial fibrillation --this was first noted when hospitalized with acute cholecystitis in 12/2018. He is s/p cholecystectomy and ERCP with sphincterotomyand stent (which was later removed). Was not started on anticoagulation at that time due to surgery, but was started on Eliquis by Dr. Martinique in 02/2019. His rate has remained controlled on no rate control medication. Echo was done showing EF 50-55%. Moderate LAE. No significant valvular disease. Last visit with Dr. Martinique was in July, no changes were made. He denies any chest pain, irregular heart beat, palpitations, tachycardia, shortness of breath.   Edema only noted yesterday, as reported above. He has some easy bruising, but denies any bleeding.  BPH: Under the care of [facility] (since getting admitted 01/2017, had UTI, AKI). At one point, due to some obstructive symptoms, finasteride was added to his hytrin. It was unclear why this was stopped, but has been doing well on hytrin alone for over a year.He last saw urologistDr. Gilford Rile in 09/2019, and no changes were made.  He has a good stream.  He is up 1-2x/night to void.  He has had some borderline and mildly elevated TSH values in the past,never symptomatic. Last year the TSH was normal, but prior to that was in the 6's.  Up to 7.690 on recent labs. He denies fatigue, cold intolerance, hair/skin/nail changes.He reports some weight gain, but had been eating out more when recently at the beach.    Ref Range & Units 4 d ago  (01/11/20) 11 mo ago  (01/24/19) 1 yr ago  (12/22/18) 1 yr ago  (06/02/18) 2 yr ago  (12/07/17)  TSH 0.450 - 4.500 uIU/mL 7.690High  2.541 R, CM  6.310High  6.760High  6.360High      Immunization History  Administered Date(s) Administered   Fluad Quad(high Dose 65+) 12/22/2018, 11/17/2019   Influenza Split 12/02/2010, 12/03/2011   Influenza, High Dose Seasonal PF 12/01/2012, 12/12/2013, 12/06/2014, 11/13/2015, 11/26/2016, 12/07/2017   Moderna SARS-COVID-2 Vaccination 02/28/2019, 03/28/2019, 01/03/2020   Pneumococcal Conjugate-13 09/12/2013   Pneumococcal Polysaccharide-23 12/25/2004, 06/04/2011, 12/03/2016   Td 04/24/2004   Tdap 12/02/2010   Zoster 10/23/2007   Zoster Recombinat (Shingrix) 06/30/2017, 10/11/2017   Last colonoscopy:12/2015, showing int/external hemorrhoids, hyperplastic polyp and diverticulosis Last PSA: no longer being screened; under  care of urologist. Dentist: twice yearly Ophtho: yearly Exercise: He is walking outside daily (weather-permitting). Goes to the gym 3 days/week, weights, bike (25  minutes). Golfs once a week.  He has some cheese on sandwiches, milk with cereal (2x/week), no yogurt. He has been taking Calcium with D twice daily, as his wife does.   Other doctors caring for patient include: Dr. Lovena Neighbours (urologist) Dr. Hassell Done (general surgeon, did melanoma excision, no regular f/u)  Dr. Pearline Cables (dermatologist) --sees every 6 months Dr. Katy Fitch (ophtho) --yearly Dr. Lavone Neri (dentist) --twice yearly Dr. Watt Climes (GI)  [facility], didn't [facility] for a year, back to qualifying, but hasn't been back  Got hearing aids fixed at [facility].  Depression screen:  Negative Functional status survey: unremarkable Fall screen: negative. Mini-Cog screen: normal (score of 5) See epic questionnaires for full screens.  End of Life Discussion: Patient hasa living will and medical power of attorney   PMH, PSH, SH and FH were reviewed and updated  Outpatient Encounter Medications as of 01/16/2020  Medication Sig Note   apixaban (ELIQUIS) 5 MG TABS tablet Take 1 tablet (5 mg total) by mouth 2 (two) times daily.    Calcium Carbonate-Vitamin D (CALCIUM 600+D HIGH POTENCY) 600-400 MG-UNIT per tablet Take 1 tablet by mouth 2 (two) times daily.     Coenzyme Q10 (COQ10) 100 MG CAPS Take 100 mg by mouth daily at 12 noon.     Glucosamine-Chondroitin (COSAMIN DS PO) Take 1 tablet by mouth 2 (two) times daily. With lunch & supper    glucose blood (ONETOUCH VERIO) test strip USE 1 STRIP TO CHECK GLUCOSE ONCE DAILY DX:E11.9    lisinopril (ZESTRIL) 5 MG tablet Take 1 tablet by mouth once daily    loratadine (CLARITIN) 10 MG tablet Take 10 mg by mouth daily.    metFORMIN (GLUCOPHAGE-XR) 500 MG 24 hr tablet TAKE 2 TABLETS BY MOUTH ONCE DAILY WITH BREAKFAST    Multiple Vitamins-Minerals (MULTIVITAMIN WITH MINERALS) tablet Take 1 tablet by mouth daily.      Omega-3 Fatty Acids (FISH OIL PO) Take 1,600 mg by mouth daily with supper.     ONETOUCH DELICA LANCETS 29U MISC 1  each by Does not apply route daily.    simvastatin (ZOCOR) 20 MG tablet TAKE 1 TABLET BY MOUTH AT BEDTIME    terazosin (HYTRIN) 5 MG capsule Take 1 capsule (5 mg total) by mouth at bedtime.    acetaminophen (TYLENOL) 325 MG tablet Take 650 mg by mouth every 6 (six) hours as needed for mild pain, moderate pain or headache.  (Patient not taking: Reported on 01/16/2020)    aspirin EC 81 MG tablet Take 81 mg by mouth daily. (Patient not taking: Reported on 11/25/2019) 01/16/2020: Stopped by Dr. Martinique   No facility-administered encounter medications on file as of 01/16/2020.   Allergies  Allergen Reactions   Penicillins Rash    Has patient had a PCN reaction causing immediate rash, facial/tongue/throat swelling, SOB or lightheadedness with hypotension: No Has patient had a PCN reaction causing severe rash involving mucus membranes or skin necrosis: No Has patient had a PCN reaction that required hospitalization: No Has patient had a PCN reaction occurring within the last 10 years: No If all of the above answers are "NO", then may proceed with Cephalosporin use.     ROS: The patient denies anorexia, fever, headaches, vision loss, ear pain, hoarseness, chest pain, palpitations, dizziness, syncope, dyspnea on exertion, cough, nausea, vomiting, diarrhea, constipation, abdominal pain, melena,  hematochezia, indigestion/heartburn, hematuria, incontinence, nocturia (just once or twice a night), weakened urine stream, dysuria, genital lesions,numbness, tingling, weakness, tremor, rashes, depression, anxiety, abnormal bleeding (easily bruises if he bumps his arms), or enlarged lymph nodes.  Rare heartburn, about once a month (spicy, spaghetti), uses Tums prn, rarely. 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 Some weight gain (eating out at the beach) Intermittently feels like his balance is a little off--he had PT for balance, which helped a lot, and is much less  often that he feels balance is off. Denies falls. Slight swelling at ankles noted today.   PHYSICAL EXAM:  BP 122/72    Pulse 65    Temp 98.5 F (36.9 C)    Ht 5' 10.5" (1.791 m)    Wt 200 lb (90.7 kg)    BMI 28.29 kg/m   Wt Readings from Last 3 Encounters:  01/16/20 200 lb (90.7 kg)  11/25/19 200 lb 9.6 oz (91 kg)  09/06/19 194 lb 9.6 oz (88.3 kg)    General Appearance:  Alert, cooperative, no distress, appears stated age   Head:  Normocephalic, without obvious abnormality, atraumatic   Eyes:  PERRL, conjunctiva/corneas clear, EOM's intact, fundinormal  Ears:  TM's and EAC's normal.  Has hearing aids bilaterally  Nose:  Not examined, wearing mask due to COVID-19 pandemic  Throat:  Not examined, wearing mask due to COVID-19 pandemic  Neck:  Supple, no lymphadenopathy; thyroid: no enlargement/ tenderness/nodules; no carotid bruit or JVD   Back:  Spine nontender, no curvature, ROM normal, no CVA tenderness. Scoliosis and prominence/kyphosis of the lower lumbar spine. nontender.  Lungs:  Clear to auscultation bilaterally without wheezes, rales or ronchi; respirations unlabored   Chest Wall:  No tenderness or deformity   Heart:  Irregularly irregular, S1 and S2 normal, no murmur, rub or gallop.  Breast Exam:  No chest wall tenderness, masses or gynecomastia   Abdomen:  Soft, non-tender, nondistended, normoactive bowel sounds, no masses, no hepatosplenomegaly. Small, soft, easily reducible umbilical hernia  Genitalia:  Normal male external genitalia without lesions. Testicles without masses. No inguinal hernias.   Rectal:  Normal sphincter tone, no masses or tenderness; very soft, guaiac negative stool. Prostate smooth, no nodules, mildly enlarged, R lobe a little more prominent than the left.  Extremities:  No clubbing, cyanosis or edema   Pulses:  2+ and symmetric all extremities   Skin:  Skin color, texture, turgor normal, no rashes. Senile purpura on his  arms. Many AK's on forearms and legsbilaterally. Skin tears x 3 L forearm, without crusting or surrounding erythema.  Scabbed lesion L upper shin (recently treated by derm). Onychomycotic 1st and 5thtoenails bilaterally (see below).Normal monofilament sensation  Lymph nodes:  Cervical, supraclavicular, and axillary nodes normal   Neurologic:  CNII-XII intact, normal strength, sensation and gait; reflexes 2+ and symmetric throughout   Psych: Normal mood, affect, hygiene and grooming  Diabetic foot exam performed.--normal monofilament.  Thickened, onychomycotic toenails, most significant at the right great toe. There is blue discoloration (hematoma) at the 2nd toe on the left. There is some thickening and hyperkeratosis of the skin at the distal great toes, most notably medially on the right.  No ingrowing nails, drainage, redness.    Lab Results  Component Value Date   HGBA1C 6.8 (H) 01/11/2020     Chemistry      Component Value Date/Time   NA 140 01/11/2020 0829   K 5.0 01/11/2020 0829   CL 102 01/11/2020 0829  CO2 22 01/11/2020 0829   BUN 23 01/11/2020 0829   CREATININE 1.13 01/11/2020 0829   CREATININE 1.07 01/29/2017 1242      Component Value Date/Time   CALCIUM 9.6 01/11/2020 0829   ALKPHOS 95 01/11/2020 0829   AST 19 01/11/2020 0829   ALT 21 01/11/2020 0829   BILITOT 0.5 01/11/2020 0829     Fasting glu 127  Lab Results  Component Value Date   CHOL 116 01/11/2020   HDL 43 01/11/2020   LDLCALC 57 01/11/2020   TRIG 81 01/11/2020   CHOLHDL 2.7 01/11/2020   Lab Results  Component Value Date   TSH 7.690 (H) 01/11/2020   Urine microalb/Cr ratio 20 (normal; down from 41 last year)   ASSESSMENT/PLAN:  Medicare annual wellness visit, subsequent  Atrial fibrillation, unspecified type ([place]) - rate controlled. Cont anticoag  Senile purpura ([facility]) - related to age and use of blood thinners  Benign prostatic hyperplasia, unspecified whether  lower urinary tract symptoms present - symptoms well controlled with hytrin  Anticoagulant long-term use - some bruising, but no e/o bleeding  Subclinical hypothyroidism - Reviewed s/sx, and to f/u for re-check or tx if symptoms develop. Recheck at next visit  Diabetes mellitus with microalbuminuria ([place]) - controlled; continue current regimen - Plan: metFORMIN (GLUCOPHAGE-XR) 500 MG 24 hr tablet, lisinopril (ZESTRIL) 5 MG tablet  Pure hypercholesterolemia - cont statin - Plan: simvastatin (ZOCOR) 20 MG tablet    Recommended at least 30 minutes of aerobic activity at least 5 days/week; weight bearing exercise at least 2x/week; proper sunscreen use reviewed; healthy diet and alcohol recommendations (less than or equal to 2 drinks/day) reviewed; regular seatbelt use; changing batteries in smoke detectors. Immunization recommendations discussed--UTD; continue yearly high dose flu shots. Will need TdaP from pharmacy next year.Colonoscopy recommendations reviewed,UTD.  MOST form reviewed and updated. Full Code, Full Care.   F/u 6 mos, med check. (should need A1c, glu, LFT's CBC, and TSH) Thyroid meds and sooner f/u only if sx  Referral to podiatrist on [address] (near [place])--onychomycosis, DM, needs nail care.   Medicare Attestation I have personally reviewed: The patient's medical and social history Their use of alcohol, tobacco or illicit drugs Their current medications and supplements The patient's functional ability including ADLs,fall risks, home safety risks, cognitive, and hearing and visual impairment Diet and physical activities Evidence for depression or mood disorders  The patient's weight, height, BMI have been recorded in the chart.  I have made referrals, counseling, and provided education to the patient based on review of the above and I have provided the patient with a written personalized care plan for preventive services.

## 2020-01-15 NOTE — Patient Instructions (Addendum)
  HEALTH MAINTENANCE RECOMMENDATIONS:  It is recommended that you get at least 30 minutes of aerobic exercise at least 5 days/week (for weight loss, you may need as much as 60-90 minutes). This can be any activity that gets your heart rate up. This can be divided in 10-15 minute intervals if needed, but try and build up your endurance at least once a week.  Weight bearing exercise is also recommended twice weekly.  Eat a healthy diet with lots of vegetables, fruits and fiber.  "Colorful" foods have a lot of vitamins (ie green vegetables, tomatoes, red peppers, etc).  Limit sweet tea, regular sodas and alcoholic beverages, all of which has a lot of calories and sugar.  Up to 2 alcoholic drinks daily may be beneficial for men (unless trying to lose weight, watch sugars).  Drink a lot of water.  Sunscreen of at least SPF 30 should be used on all sun-exposed parts of the skin when outside between the hours of 10 am and 4 pm (not just when at beach or pool, but even with exercise, golf, tennis, and yard work!)  Use a sunscreen that says "broad spectrum" so it covers both UVA and UVB rays, and make sure to reapply every 1-2 hours.  Remember to change the batteries in your smoke detectors when changing your clock times in the spring and fall.  Carbon monoxide detectors are recommended for your home.  Use your seat belt every time you are in a car, and please drive safely and not be distracted with cell phones and texting while driving.    Andre Walker , Thank you for taking time to come for your Medicare Wellness Visit. I appreciate your ongoing commitment to your health goals. Please review the following plan we discussed and let me know if I can assist you in the future.    This is a list of the screening recommended for you and due dates:  Health Maintenance  Topic Date Due  . Complete foot exam   12/27/2019  . Hemoglobin A1C  07/10/2020  . Eye exam for diabetics  09/27/2020  . Tetanus Vaccine   12/01/2020  . Flu Shot  Completed  . COVID-19 Vaccine  Completed  . Pneumonia vaccines  Completed   Foot exam was performed today. Tetanus booster will be needed next year (you will need to get at the pharmacy)  You do not need to take the second calcium pill if you have had at least 1 serving of dairy (milk in your cereal or cheese or yogurt).

## 2020-01-16 ENCOUNTER — Other Ambulatory Visit: Payer: Self-pay

## 2020-01-16 ENCOUNTER — Ambulatory Visit (INDEPENDENT_AMBULATORY_CARE_PROVIDER_SITE_OTHER): Payer: Medicare Other | Admitting: Family Medicine

## 2020-01-16 ENCOUNTER — Encounter: Payer: Self-pay | Admitting: Family Medicine

## 2020-01-16 VITALS — BP 122/72 | HR 65 | Temp 98.5°F | Ht 70.5 in | Wt 200.0 lb

## 2020-01-16 DIAGNOSIS — I4891 Unspecified atrial fibrillation: Secondary | ICD-10-CM | POA: Diagnosis not present

## 2020-01-16 DIAGNOSIS — E7800 Pure hypercholesterolemia, unspecified: Secondary | ICD-10-CM

## 2020-01-16 DIAGNOSIS — E038 Other specified hypothyroidism: Secondary | ICD-10-CM

## 2020-01-16 DIAGNOSIS — Z0000 Encounter for general adult medical examination without abnormal findings: Secondary | ICD-10-CM | POA: Diagnosis not present

## 2020-01-16 DIAGNOSIS — N400 Enlarged prostate without lower urinary tract symptoms: Secondary | ICD-10-CM | POA: Diagnosis not present

## 2020-01-16 DIAGNOSIS — B351 Tinea unguium: Secondary | ICD-10-CM

## 2020-01-16 DIAGNOSIS — R809 Proteinuria, unspecified: Secondary | ICD-10-CM

## 2020-01-16 DIAGNOSIS — Z7901 Long term (current) use of anticoagulants: Secondary | ICD-10-CM | POA: Diagnosis not present

## 2020-01-16 DIAGNOSIS — D692 Other nonthrombocytopenic purpura: Secondary | ICD-10-CM

## 2020-01-16 DIAGNOSIS — E1129 Type 2 diabetes mellitus with other diabetic kidney complication: Secondary | ICD-10-CM

## 2020-01-16 MED ORDER — LISINOPRIL 5 MG PO TABS: 5.0000 mg | ORAL_TABLET | Freq: Every day | ORAL | 3 refills | Status: DC

## 2020-01-16 MED ORDER — SIMVASTATIN 20 MG PO TABS: 20.0000 mg | ORAL_TABLET | Freq: Every day | ORAL | 3 refills | Status: DC

## 2020-01-16 MED ORDER — METFORMIN HCL ER 500 MG PO TB24: 1000.0000 mg | ORAL_TABLET | Freq: Every day | ORAL | 1 refills | Status: DC

## 2020-03-09 ENCOUNTER — Ambulatory Visit: Payer: No Typology Code available for payment source | Admitting: Cardiology

## 2020-03-18 NOTE — Progress Notes (Signed)
Cardiology Office Note   Date:  03/18/2020   ID:  Andre Walker, DOB 25-Jun-1932, MRN [id number]  PCP:  Rita Ohara, MD  Cardiologist:   Eziah Negro [place], MD   No chief complaint on file.     History of Present Illness: Andre Walker is a 87 y.o. male who is seen for follow up  atrial fibrillation. He has a history of DM and HLD. No known history of cardiac disease. He was admitted in early December 2020 with acute cholecystitis. He was noted to be in atrial fibrillation with controlled rate. He underwent cholecystectomy as well as ERCP with sphincterotomy and stent. Was not started on anticoagulation due to surgery. Rate remained controlled on no rate control medication. Echo was done showing EF 50-55%. Moderate LAE. No significant valvular disease. [place] vasc score is 3. He was subsequently started on Eliquis as outpatient.   He denies any palpitations, dizziness, chest pain or SOB. He exercises regularly and notes no limitation. He plays golf.  No bleeding on Eliquis. Lives at [facility].   Past Medical History:  Diagnosis Date  . Actinic keratosis    Dr. Allyson Sabal  . Atrial fibrillation ([place])   . BPH (benign prostatic hyperplasia)    controlled on Hytrin  . Colon polyps    Dr. Watt Climes  . Diabetes mellitus 2004  . Diverticulosis   . ED (erectile dysfunction)   . Gallstones   . Hearing loss    high frequency  . Hiatal hernia    small, noted on EGD 10/2010  . LAE (left atrial enlargement) 01/2019   Moderate, Noted on ECHO  . Melanoma ([place])    right arm  . Microalbuminuria    on ACEI for microalbuminuria (does NOT have HTN)  . Pneumonia 01/2019   Bilateral, noted on CT  . Pure hypercholesterolemia 2006  . Traumatic partial tear of biceps tendon 01/2009   "popeye" injury on right  . Wears glasses   . Wears hearing aid in both ears   . Wears partial dentures    lower  . Wears partial dentures    lower    Past Surgical History:  Procedure Laterality Date  .  APPENDECTOMY  1955  . BILIARY STENT PLACEMENT N/A 01/28/2019   Procedure: BILIARY STENT PLACEMENT;  Surgeon: Arta Silence, MD;  Location: [facility];  Service: Endoscopy;  Laterality: N/A;  . CATARACT EXTRACTION Bilateral R 05/07/15, L 05/21/15   Dr. Katy Fitch  . CHOLECYSTECTOMY N/A 01/27/2019   Procedure: LAPAROSCOPIC CHOLECYSTECTOMY WITH INTRAOPEREATIVE CHOLANGIOGRAM;  Surgeon: Alphonsa Overall, MD;  Location: [facility];  Service: General;  Laterality: N/A;  . COLONOSCOPY  11/09, 10/29/10   Dr. Watt Climes  . ERCP N/A 01/28/2019   Procedure: ENDOSCOPIC RETROGRADE CHOLANGIOPANCREATOGRAPHY (ERCP);  Surgeon: Arta Silence, MD;  Location: [facility];  Service: Endoscopy;  Laterality: N/A;  . ERCP N/A 04/12/2019   Procedure: ENDOSCOPIC RETROGRADE CHOLANGIOPANCREATOGRAPHY (ERCP);  Surgeon: Clarene Essex, MD;  Location: [facility];  Service: Endoscopy;  Laterality: N/A;  stent removal  . ESOPHAGOGASTRODUODENOSCOPY  10/29/10   Dr. Watt Climes  . INGUINAL HERNIA REPAIR Right 1989  . MELANOMA EXCISION  10/08/10   R upper arm, Dr. Hassell Done  . MELANOMA EXCISION  03/16/2012   Procedure: MELANOMA EXCISION;  Surgeon: Pedro Earls, MD;  Location: [facility];  Service: General;  Laterality: Right;  excision of melanoma of right arm  . REMOVAL OF STONES  01/28/2019   Procedure: REMOVAL OF STONES;  Surgeon: Arta Silence,  MD;  Location: [facility];  Service: Endoscopy;;  . SPHINCTEROTOMY  01/28/2019   Procedure: Joan Mayans;  Surgeon: Arta Silence, MD;  Location: [facility];  Service: Endoscopy;;  . SPHINCTEROTOMY  04/12/2019   Procedure: Joan Mayans;  Surgeon: Clarene Essex, MD;  Location: [facility];  Service: Endoscopy;;  stone sludge  . STENT REMOVAL  04/12/2019   Procedure: STENT REMOVAL;  Surgeon: Clarene Essex, MD;  Location: [facility];  Service: Endoscopy;;  . TOTAL KNEE ARTHROPLASTY Right 2012  . TOTAL KNEE ARTHROPLASTY Left 2005     Current Outpatient Medications  Medication Sig  Dispense Refill  . acetaminophen (TYLENOL) 325 MG tablet Take 650 mg by mouth every 6 (six) hours as needed for mild pain, moderate pain or headache.  (Patient not taking: Reported on 01/16/2020)    . apixaban (ELIQUIS) 5 MG TABS tablet Take 1 tablet (5 mg total) by mouth 2 (two) times daily. 180 tablet 3  . aspirin EC 81 MG tablet Take 81 mg by mouth daily. (Patient not taking: Reported on 11/25/2019)    . Calcium Carbonate-Vitamin D (CALCIUM 600+D HIGH POTENCY) 600-400 MG-UNIT per tablet Take 1 tablet by mouth 2 (two) times daily.     . Coenzyme Q10 (COQ10) 100 MG CAPS Take 100 mg by mouth daily at 12 noon.     . Glucosamine-Chondroitin (COSAMIN DS PO) Take 1 tablet by mouth 2 (two) times daily. With lunch & supper    . glucose blood (ONETOUCH VERIO) test strip USE 1 STRIP TO CHECK GLUCOSE ONCE DAILY DX:E11.9 100 each 3  . lisinopril (ZESTRIL) 5 MG tablet Take 1 tablet (5 mg total) by mouth daily. 90 tablet 3  . loratadine (CLARITIN) 10 MG tablet Take 10 mg by mouth daily.    . metFORMIN (GLUCOPHAGE-XR) 500 MG 24 hr tablet Take 2 tablets (1,000 mg total) by mouth daily with breakfast. 180 tablet 1  . Multiple Vitamins-Minerals (MULTIVITAMIN WITH MINERALS) tablet Take 1 tablet by mouth daily.      . Omega-3 Fatty Acids (FISH OIL PO) Take 1,600 mg by mouth daily with supper.     Glory Rosebush DELICA LANCETS 40J MISC 1 each by Does not apply route daily. 100 each 2  . simvastatin (ZOCOR) 20 MG tablet Take 1 tablet (20 mg total) by mouth at bedtime. 90 tablet 3  . terazosin (HYTRIN) 5 MG capsule Take 1 capsule (5 mg total) by mouth at bedtime. 90 capsule 2   No current facility-administered medications for this visit.    Allergies:   Penicillins    Social History:  The patient  reports that he has never smoked. He has never used smokeless tobacco. He reports current alcohol use. He reports that he does not use drugs.   Family History:  The patient's family history includes Cancer in his paternal  uncle; Diabetes in his brother, sister, and sister; Heart disease in his brother and paternal uncle; Hyperlipidemia in his brother; Hypertension in his brother; Stroke in his brother.    ROS:  Please see the history of present illness.   Otherwise, review of systems are positive for none.   All other systems are reviewed and negative.    PHYSICAL EXAM: VS:  There were no vitals taken for this visit. , BMI There is no height or weight on file to calculate BMI. GEN: Well nourished, well developed, in no acute distress  HEENT: normal  Neck: no JVD, carotid bruits, or masses Cardiac: IRRR; no murmurs, rubs, or gallops,no edema  Respiratory:  clear to auscultation bilaterally, normal work of breathing GI: soft, nontender, nondistended, + BS MS: no deformity or atrophy  Skin: warm and dry, no rash Neuro:  Strength and sensation are intact Psych: euthymic mood, full affect   EKG:  EKG is ordered today. It shows Afib rate 73.  Otherwise Normal. I have personally reviewed and interpreted this study.   Recent Labs: 01/11/2020: ALT 21; BUN 23; Creatinine, Ser 1.13; Hemoglobin 13.7; Platelets 249; Potassium 5.0; Sodium 140; TSH 7.690    Lipid Panel    Component Value Date/Time   CHOL 116 01/11/2020 0829   TRIG 81 01/11/2020 0829   HDL 43 01/11/2020 0829   CHOLHDL 2.7 01/11/2020 0829   CHOLHDL 3.0 11/26/2016 0745   VLDL 15 11/07/2015 0728   LDLCALC 57 01/11/2020 0829   LDLCALC 57 11/26/2016 0745      Wt Readings from Last 3 Encounters:  01/16/20 200 lb (90.7 kg)  11/25/19 200 lb 9.6 oz (91 kg)  09/06/19 194 lb 9.6 oz (88.3 kg)      Other studies Reviewed: Additional studies/ records that were reviewed today include:    Echo 01/25/19:IMPRESSIONS    1. Left ventricular ejection fraction, by visual estimation, is 50 to 55%. The left ventricle has low normal function. There is no left ventricular hypertrophy.  2. Left ventricular diastolic parameters are indeterminate.  3.  Global right ventricle has normal systolic function.The right ventricular size is normal. No increase in right ventricular wall thickness.  4. Left atrial size was moderately dilated.  5. Right atrial size was normal.  6. The mitral valve is normal in structure. No evidence of mitral valve regurgitation. No evidence of mitral stenosis.  7. The tricuspid valve is normal in structure. Tricuspid valve regurgitation is mild.  8. The aortic valve is tricuspid. Aortic valve regurgitation is mild. Mild aortic valve sclerosis without stenosis.  9. The pulmonic valve was grossly normal. Pulmonic valve regurgitation is mild. 10. Aortic dilatation noted. 11. There is mild dilatation of the aortic root measuring 39 mm. 12. Mildly elevated pulmonary artery systolic pressure. 13. The inferior vena cava is normal in size with greater than 50% respiratory variability, suggesting right atrial pressure of 3 mmHg.    ASSESSMENT AND PLAN:  1.  Atrial fibrillation persistent. Mal[place] vasc score of 3. Rate is controlled on no rate control therapy. He is asymptomatic. I would recommend continued rate control only. On Eliquis 5 mg bid.  2. DM type 2 A1c 6.2%.    Current medicines are reviewed at length with the patient today.  The patient does not have concerns regarding medicines.  The following changes have been made:  See above.   Labs/ tests ordered today include:   No orders of the defined types were placed in this encounter.    Disposition:   FU with me in one year   Signed, Jakeira Seeman [place], MD  03/18/2020 9:23 AM    [facility] [address], [place], [place], [address] Phone [phone], Fax [phone]

## 2020-03-21 ENCOUNTER — Ambulatory Visit (INDEPENDENT_AMBULATORY_CARE_PROVIDER_SITE_OTHER): Payer: Medicare Other | Admitting: Cardiology

## 2020-03-21 ENCOUNTER — Other Ambulatory Visit: Payer: Self-pay

## 2020-03-21 ENCOUNTER — Encounter: Payer: Self-pay | Admitting: Cardiology

## 2020-03-21 VITALS — BP 108/65 | HR 72 | Ht 71.0 in | Wt 198.2 lb

## 2020-03-21 DIAGNOSIS — I4819 Other persistent atrial fibrillation: Secondary | ICD-10-CM | POA: Diagnosis not present

## 2020-03-30 ENCOUNTER — Other Ambulatory Visit: Payer: Self-pay | Admitting: Cardiology

## 2020-03-30 NOTE — Telephone Encounter (Signed)
Prescription refill request for Eliquis received. Indication:atrial fibrillation Last office visit:1/22  [place] Scr:1.1  11/21 Age: 87 Weight:89.9 kg  Prescription refilled

## 2020-04-13 ENCOUNTER — Other Ambulatory Visit: Payer: Self-pay | Admitting: Family Medicine

## 2020-04-13 DIAGNOSIS — N400 Enlarged prostate without lower urinary tract symptoms: Secondary | ICD-10-CM

## 2020-04-13 NOTE — Telephone Encounter (Signed)
Ok to refill or does he need to get a refill from urology

## 2020-04-30 ENCOUNTER — Telehealth: Payer: Self-pay | Admitting: *Deleted

## 2020-04-30 DIAGNOSIS — Z5181 Encounter for therapeutic drug level monitoring: Secondary | ICD-10-CM

## 2020-04-30 DIAGNOSIS — R809 Proteinuria, unspecified: Secondary | ICD-10-CM

## 2020-04-30 DIAGNOSIS — E1129 Type 2 diabetes mellitus with other diabetic kidney complication: Secondary | ICD-10-CM

## 2020-04-30 DIAGNOSIS — Z7901 Long term (current) use of anticoagulants: Secondary | ICD-10-CM

## 2020-04-30 DIAGNOSIS — E038 Other specified hypothyroidism: Secondary | ICD-10-CM

## 2020-04-30 NOTE — Telephone Encounter (Signed)
Orders entered

## 2020-04-30 NOTE — Telephone Encounter (Signed)
Patient scheduled for med check 5/18-scheduled labs for 07/09/20-need orders please.

## 2020-05-14 ENCOUNTER — Other Ambulatory Visit: Payer: Self-pay | Admitting: Family Medicine

## 2020-05-17 ENCOUNTER — Other Ambulatory Visit: Payer: Self-pay | Admitting: *Deleted

## 2020-05-17 ENCOUNTER — Ambulatory Visit (INDEPENDENT_AMBULATORY_CARE_PROVIDER_SITE_OTHER): Payer: Medicare Other | Admitting: Family Medicine

## 2020-05-17 ENCOUNTER — Other Ambulatory Visit: Payer: Self-pay

## 2020-05-17 ENCOUNTER — Encounter: Payer: Self-pay | Admitting: Family Medicine

## 2020-05-17 VITALS — BP 118/60 | HR 88 | Ht 71.0 in | Wt 201.2 lb

## 2020-05-17 DIAGNOSIS — H61322 Acquired stenosis of left external ear canal secondary to inflammation and infection: Secondary | ICD-10-CM | POA: Diagnosis not present

## 2020-05-17 MED ORDER — NEOMYCIN-POLYMYXIN-HC 3.5-10000-1 OT SOLN: 4.0000 [drp] | Freq: Three times a day (TID) | OTIC | 0 refills | Status: DC

## 2020-05-17 MED ORDER — ONETOUCH DELICA PLUS LANCET33G MISC: 2 refills | Status: DC

## 2020-05-17 MED ORDER — ONETOUCH VERIO VI STRP: ORAL_STRIP | 3 refills | Status: DC

## 2020-05-17 NOTE — Patient Instructions (Signed)
Use the drops into your left ear for 10 days. If you develop pain, decreased hearing or other symptoms related to the left ear, please return for recheck. Otherwise we will just recheck at your visit in May. If it stays very narrow, we may send you to ENT doctor for evaluation.

## 2020-05-17 NOTE — Progress Notes (Signed)
Chief Complaint  Patient presents with   Swollen gland    Was at annual hearing check yesterday and was told that he has swollen gland in left ear. He thinks he may have gotten some of the cream he uses on his arms in it.    Patient was told that his ear canal appeared swollen when seen for hearing aid check. He had some soreness in the left ear over the weekend, but this has resolved.  No change in hearing.  He thinks he got 5FU in the ear (also got some on the L face, as he had been laying on the L arm that had the cream on it).  He states he completed treatment on the left arm with effudex, now using it on the right, but using a sleeve over it.  PMH, PSH, SH reviewed  Outpatient Encounter Medications as of 05/17/2020  Medication Sig   acetaminophen (TYLENOL) 325 MG tablet Take 650 mg by mouth every 6 (six) hours as needed for mild pain, moderate pain or headache.   Calcium Carbonate-Vitamin D 600-400 MG-UNIT tablet Take 1 tablet by mouth 2 (two) times daily.   Coenzyme Q10 (COQ10) 100 MG CAPS Take 100 mg by mouth daily at 12 noon.   ELIQUIS 5 MG TABS tablet Take 1 tablet by mouth twice daily   fluorouracil (EFUDEX) 5 % cream APPLY TO AFFECTED AREAS AT BEDTIME FOR 3 WEEKS.   Glucosamine-Chondroitin (COSAMIN DS PO) Take 1 tablet by mouth 2 (two) times daily. With lunch & supper   lisinopril (ZESTRIL) 5 MG tablet Take 1 tablet (5 mg total) by mouth daily.   loratadine (CLARITIN) 10 MG tablet Take 10 mg by mouth daily.   metFORMIN (GLUCOPHAGE-XR) 500 MG 24 hr tablet Take 2 tablets (1,000 mg total) by mouth daily with breakfast.   Multiple Vitamins-Minerals (MULTIVITAMIN WITH MINERALS) tablet Take 1 tablet by mouth daily.   neomycin-polymyxin-hydrocortisone (CORTISPORIN) OTIC solution Place 4 drops into the left ear 3 (three) times daily. Use for 10 days   Omega-3 Fatty Acids (FISH OIL PO) Take 1,600 mg by mouth daily with supper.    simvastatin (ZOCOR) 20 MG tablet Take 1 tablet  (20 mg total) by mouth at bedtime.   terazosin (HYTRIN) 5 MG capsule Take 1 capsule by mouth at bedtime   [DISCONTINUED] glucose blood (ONETOUCH VERIO) test strip USE 1 STRIP TO CHECK GLUCOSE ONCE DAILY DX:E11.9   [DISCONTINUED] Lancets (ONETOUCH DELICA PLUS [id number]) MISC USE 1  TO CHECK GLUCOSE ONCE DAILY   No facility-administered encounter medications on file as of 05/17/2020.   Allergies  Allergen Reactions   Penicillins Rash    Has patient had a PCN reaction causing immediate rash, facial/tongue/throat swelling, SOB or lightheadedness with hypotension: No Has patient had a PCN reaction causing severe rash involving mucus membranes or skin necrosis: No Has patient had a PCN reaction that required hospitalization: No Has patient had a PCN reaction occurring within the last 10 years: No If all of the above answers are "NO", then may proceed with Cephalosporin use.     ROS: no fever, chills, URI symptoms, change in hearing, any current ear pain, nausea, vomiting, diarrhea, or other concerns.  See HPI.   PHYSICAL EXAM:  BP 118/60    Pulse 88    Ht 5\' 11"  (1.803 m)    Wt 201 lb 3.2 oz (91.3 kg)    BMI 28.06 kg/m   Well-appearing male, in no distress HEENT: conjunctiva and sclera are clear,  EOMI R TM and EAC is normal. L canal is very narrow, unable to visualize TM.  The canal itself appears normal--no erythema, no flaking, no debris, just very narrow (mainly anteriorly). External ear is normal. No pain with movement of auricle. 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 If any pain, decrease hearing, to f/u sooner, otherwise recheck at May  visit. May need ENT referral.

## 2020-06-13 ENCOUNTER — Other Ambulatory Visit: Payer: Self-pay

## 2020-06-13 ENCOUNTER — Encounter: Payer: Self-pay | Admitting: Family Medicine

## 2020-06-13 ENCOUNTER — Other Ambulatory Visit (INDEPENDENT_AMBULATORY_CARE_PROVIDER_SITE_OTHER): Payer: Medicare Other

## 2020-06-13 ENCOUNTER — Telehealth (INDEPENDENT_AMBULATORY_CARE_PROVIDER_SITE_OTHER): Payer: Medicare Other | Admitting: Family Medicine

## 2020-06-13 VITALS — BP 120/71 | HR 84 | Ht 71.0 in | Wt 196.0 lb

## 2020-06-13 DIAGNOSIS — R059 Cough, unspecified: Secondary | ICD-10-CM

## 2020-06-13 LAB — POC COVID19 BINAXNOW: SARS Coronavirus 2 Ag: NEGATIVE

## 2020-06-13 MED ORDER — BENZONATATE 100 MG PO CAPS: 200.0000 mg | ORAL_CAPSULE | Freq: Three times a day (TID) | ORAL | 0 refills | Status: DC | PRN

## 2020-06-13 NOTE — Progress Notes (Signed)
Start time: 12:05 End time: 12:22   Virtual Visit viaTelephone Note  I connected with Andre Walker on 06/13/20 by telephone and verified that I am speaking with the correct person using two identifiers.  Location: Patient: home Provider: office   I discussed the limitations of evaluation and management by telemedicine and the availability of in person appointments. The patient expressed understanding and agreed to proceed.  History of Present Illness:  Chief Complaint  Patient presents with  . Cough    PHONE CALL has had a cold since last weekend. Has been coughing so much that his stomach hurts-coughed all through the night. He said that one time you gave him some little cough pills that helped. Robitussin and Delsym are not helping. Has not taken any home covid tests. No fever, chills or body aches.    Played golf in [place], slept in a cold room.  He started with runny nose on Sunday, and cough got worse that night. Has been coughing for the last 2 days, and body is sore from coughing.  In the morning he gets up phlegm (discolored), rest of the day cough is nonproductive.  Cough isn't waking him up or keeping him up (just bathroom trips per usual). Slight crusting of the eyes, not itchy. He is taking loratidine daily. The runny nose resolved.  No ear pain or sore throat.  He was taking robitussin at first, switched to [facility] yesterday, but he still coughed all night.  No fever or chills. Denies sick contacts.  PMH, PSH, SH reviewed  Outpatient Encounter Medications as of 06/13/2020  Medication Sig Note  . benzonatate (TESSALON) 100 MG capsule Take 2 capsules (200 mg total) by mouth 3 (three) times daily as needed for cough.   . Calcium Carbonate-Vitamin D 600-400 MG-UNIT tablet Take 1 tablet by mouth 2 (two) times daily.   . Coenzyme Q10 (COQ10) 100 MG CAPS Take 100 mg by mouth daily at 12 noon.   Marland Kitchen Dextromethorphan Polistirex (DELSYM PO) Take 10 mLs by mouth as needed.  06/13/2020: Took this am  . ELIQUIS 5 MG TABS tablet Take 1 tablet by mouth twice daily   . fluorouracil (EFUDEX) 5 % cream APPLY TO AFFECTED AREAS AT BEDTIME FOR 3 WEEKS.   . Glucosamine-Chondroitin (COSAMIN DS PO) Take 1 tablet by mouth 2 (two) times daily. With lunch & supper   . glucose blood (ONETOUCH VERIO) test strip USE 1 STRIP TO CHECK GLUCOSE ONCE DAILY DX:E11.69   . Lancets (ONETOUCH DELICA PLUS [id number]) MISC USE 1  TO CHECK GLUCOSE ONCE DAILY   . lisinopril (ZESTRIL) 5 MG tablet Take 1 tablet (5 mg total) by mouth daily.   Marland Kitchen loratadine (CLARITIN) 10 MG tablet Take 10 mg by mouth daily.   . metFORMIN (GLUCOPHAGE-XR) 500 MG 24 hr tablet Take 2 tablets (1,000 mg total) by mouth daily with breakfast.   . Multiple Vitamins-Minerals (MULTIVITAMIN WITH MINERALS) tablet Take 1 tablet by mouth daily.   . Omega-3 Fatty Acids (FISH OIL PO) Take 1,600 mg by mouth daily with supper.    . simvastatin (ZOCOR) 20 MG tablet Take 1 tablet (20 mg total) by mouth at bedtime.   Marland Kitchen terazosin (HYTRIN) 5 MG capsule Take 1 capsule by mouth at bedtime   . [DISCONTINUED] neomycin-polymyxin-hydrocortisone (CORTISPORIN) OTIC solution Place 4 drops into the left ear 3 (three) times daily. Use for 10 days   . acetaminophen (TYLENOL) 325 MG tablet Take 650 mg by mouth every 6 (six) hours as  needed for mild pain, moderate pain or headache. (Patient not taking: Reported on 06/13/2020)    No facility-administered encounter medications on file as of 06/13/2020.   NOT taking benzonatate prior to today's visit  Allergies  Allergen Reactions  . Penicillins Rash    Has patient had a PCN reaction causing immediate rash, facial/tongue/throat swelling, SOB or lightheadedness with hypotension: No Has patient had a PCN reaction causing severe rash involving mucus membranes or skin necrosis: No Has patient had a PCN reaction that required hospitalization: No Has patient had a PCN reaction occurring within the last 10 years:  No If all of the above answers are "NO", then may proceed with Cephalosporin use.     ROS:  No fever, headaches, ear pain.  +URI symptoms per HPI.  No chest pain, shortness o breath.  No n/v/d or urinary complaints. No rashes or bruising. GU symptoms at baseline.    Observations/Objective:  BP 120/71   Pulse 84   Ht 5\' 11"  (1.803 m)   Wt 196 lb (88.9 kg)   BMI 27.34 kg/m   Pleasant male, alert and oriented, in no distress. Speaking easily, no coughing during visit. Exam is limited due to virtual nature of the visit  COVID test negative  Assessment and Plan:  Cough - viral syndrome vs allergies vs both. Supportive measures reviewed - Plan: benzonatate (TESSALON) 100 MG capsule   Stay well hydrated. Take plain Mucinex or Robitussin (guaifenesin) to help keep any mucus or phlegm clear. Take the benzonatate up to three times daily if needed for cough.  You need to contact us if you develop any fever, shortness off breath, worsening cough, pain with breathing or other new symptoms.    Follow Up Instructions:    I discussed the assessment and treatment plan with the patient. The patient was provided an opportunity to ask questions and all were answered. The patient agreed with the plan and demonstrated an understanding of the instructions.   The patient was advised to call back or seek an in-person evaluation if the symptoms worsen or if the condition fails to improve as anticipated.  I spent 20 minutes dedicated to the care of this patient, including pre-visit review of records, face to face time, post-visit ordering of testing and documentation.    Vikki Ports, MD

## 2020-06-13 NOTE — Patient Instructions (Signed)
Stay well hydrated. Take plain Mucinex or Robitussin (guaifenesin) to help keep any mucus or phlegm clear. Take the benzonatate up to three times daily if needed for cough.  You need to contact us if you develop any fever, shortness off breath, worsening cough, pain with breathing or other new symptoms.

## 2020-06-14 LAB — SARS-COV-2, NAA 2 DAY TAT

## 2020-06-14 LAB — NOVEL CORONAVIRUS, NAA: SARS-CoV-2, NAA: NOT DETECTED

## 2020-07-09 ENCOUNTER — Other Ambulatory Visit (INDEPENDENT_AMBULATORY_CARE_PROVIDER_SITE_OTHER): Payer: Medicare Other

## 2020-07-09 DIAGNOSIS — E1129 Type 2 diabetes mellitus with other diabetic kidney complication: Secondary | ICD-10-CM

## 2020-07-09 DIAGNOSIS — E038 Other specified hypothyroidism: Secondary | ICD-10-CM

## 2020-07-09 DIAGNOSIS — Z23 Encounter for immunization: Secondary | ICD-10-CM

## 2020-07-09 DIAGNOSIS — Z5181 Encounter for therapeutic drug level monitoring: Secondary | ICD-10-CM

## 2020-07-09 DIAGNOSIS — Z7901 Long term (current) use of anticoagulants: Secondary | ICD-10-CM

## 2020-07-10 LAB — CBC WITH DIFFERENTIAL/PLATELET
Basophils Absolute: 0 10*3/uL (ref 0.0–0.2)
Basos: 1 %
EOS (ABSOLUTE): 0.3 10*3/uL (ref 0.0–0.4)
Eos: 4 %
Hematocrit: 40.8 % (ref 37.5–51.0)
Hemoglobin: 13.6 g/dL (ref 13.0–17.7)
Immature Grans (Abs): 0 10*3/uL (ref 0.0–0.1)
Immature Granulocytes: 0 %
Lymphocytes Absolute: 2.4 10*3/uL (ref 0.7–3.1)
Lymphs: 31 %
MCH: 31.8 pg (ref 26.6–33.0)
MCHC: 33.3 g/dL (ref 31.5–35.7)
MCV: 95 fL (ref 79–97)
Monocytes Absolute: 0.6 10*3/uL (ref 0.1–0.9)
Monocytes: 8 %
Neutrophils Absolute: 4.2 10*3/uL (ref 1.4–7.0)
Neutrophils: 56 %
Platelets: 248 10*3/uL (ref 150–450)
RBC: 4.28 x10E6/uL (ref 4.14–5.80)
RDW: 12.2 % (ref 11.6–15.4)
WBC: 7.7 10*3/uL (ref 3.4–10.8)

## 2020-07-10 LAB — COMPREHENSIVE METABOLIC PANEL
ALT: 20 IU/L (ref 0–44)
AST: 19 IU/L (ref 0–40)
Albumin/Globulin Ratio: 1.6 (ref 1.2–2.2)
Albumin: 4.2 g/dL (ref 3.6–4.6)
Alkaline Phosphatase: 105 IU/L (ref 44–121)
BUN/Creatinine Ratio: 23 (ref 10–24)
BUN: 26 mg/dL (ref 8–27)
Bilirubin Total: 0.5 mg/dL (ref 0.0–1.2)
CO2: 21 mmol/L (ref 20–29)
Calcium: 9.9 mg/dL (ref 8.6–10.2)
Chloride: 103 mmol/L (ref 96–106)
Creatinine, Ser: 1.15 mg/dL (ref 0.76–1.27)
Globulin, Total: 2.7 g/dL (ref 1.5–4.5)
Glucose: 141 mg/dL — ABNORMAL HIGH (ref 65–99)
Potassium: 4.9 mmol/L (ref 3.5–5.2)
Sodium: 139 mmol/L (ref 134–144)
Total Protein: 6.9 g/dL (ref 6.0–8.5)
eGFR: 62 mL/min/{1.73_m2} (ref >59)

## 2020-07-10 LAB — HEMOGLOBIN A1C
Est. average glucose Bld gHb Est-mCnc: 154 mg/dL
Hgb A1c MFr Bld: 7 % — ABNORMAL HIGH (ref 4.8–5.6)

## 2020-07-10 LAB — TSH: TSH: 6.5 u[IU]/mL — ABNORMAL HIGH (ref 0.450–4.500)

## 2020-07-10 NOTE — Progress Notes (Signed)
Chief Complaint  Patient presents with  . Diabetes    Nonfasting med check, no new concerns.     Patient presents for 6 month follow-up. See below for labs done prior to today's visit.  He was last seen in 04/2020 with L ear canal swelling. On exam he was noted to have a very narrow canal on the left (I was unable to visualize TM). He was treated with Cortisporin and advised of potential need for ENT referral if not improving.  Due for recheck today. He reports that the drops "took care of it", no longer has any problems with the left ear, hearing is back to baseline.  Diabetes follow-up: Blood sugars at home are running 120-140, it was 140 today. He doesn't check it other times of the day. He admits to eating more potatoes and pasta recently. He reports very little sweets (1 dessert a week). Denies hypoglycemia,polydipsia and polyuria.He reports compliance with metformin ER (1053m daily)and denies side effects. Last eye exam was in 09/2019, no retinopathy.Denies numbness, tingling, burning or sores on his feet.  He has h/o microalbuminuria, and continues on lisinopril without side effects.Denies dizziness or cough.  Hyperlipidemia follow-up: Patient is reportedly following a low-fat, low cholesterol diet. Compliant with medications(simvastatin)and denies medication side effects. Lipids were at goal on last check: Lab Results  Component Value Date   CHOL 116 01/11/2020   HDL 43 01/11/2020   LDLCALC 57 01/11/2020   TRIG 81 01/11/2020   CHOLHDL 2.7 01/11/2020    Atrial fibrillation -- Noted after cholecystectomy, has persisted.  He last saw Dr. JMartiniquein 02/2020. He continues on anticoagulants. He has not required any rate-controlling medications.  He has some easy bruising, but denies any bleeding.  BPH: Under the care of [facility] (since getting admitted 01/2017, had UTI, AKI). He continues to well on Hytrin alone (at one point, due to some obstructive symptoms,  finasteride was added to his hytrin. It was unclear why this was stopped, but has been doing well).He is under the care ofDr. WGilford Rile  He has a good stream.  He is up 1-2x/night to void.  Subclinical hypothyroidism: He has had some borderline and mildly elevated TSH values in the past,never symptomatic. TSH was normal in 12/2018, in the 6's prior to that, and went up to 7.690 in 12/2019. He denies fatigue, cold intolerance, hair/skin/nail changes.   PMH, PSH, SH reviewed  Outpatient Encounter Medications as of 07/11/2020  Medication Sig Note  . acetaminophen (TYLENOL) 325 MG tablet Take 650 mg by mouth every 6 (six) hours as needed for mild pain, moderate pain or headache.   . Calcium Carbonate-Vitamin D 600-400 MG-UNIT tablet Take 1 tablet by mouth 2 (two) times daily.   . Coenzyme Q10 (COQ10) 100 MG CAPS Take 100 mg by mouth daily at 12 noon. 07/11/2020: Taking 2052mdaily  . ELIQUIS 5 MG TABS tablet Take 1 tablet by mouth twice daily   . Glucosamine-Chondroitin (COSAMIN DS PO) Take 1 tablet by mouth 2 (two) times daily. With lunch & supper   . glucose blood (ONETOUCH VERIO) test strip USE 1 STRIP TO CHECK GLUCOSE ONCE DAILY DX:E11.69   . Lancets (ONETOUCH DELICA PLUS [id number] USE 1  TO CHECK GLUCOSE ONCE DAILY   . lisinopril (ZESTRIL) 5 MG tablet Take 1 tablet (5 mg total) by mouth daily.   . Marland Kitchenoratadine (CLARITIN) 10 MG tablet Take 10 mg by mouth daily.   . metFORMIN (GLUCOPHAGE-XR) 500 MG 24 hr tablet Take 2  tablets (1,000 mg total) by mouth daily with breakfast.   . Multiple Vitamins-Minerals (MULTIVITAMIN WITH MINERALS) tablet Take 1 tablet by mouth daily.   . Omega-3 Fatty Acids (FISH OIL PO) Take 1,600 mg by mouth daily with supper.    . simvastatin (ZOCOR) 20 MG tablet Take 1 tablet (20 mg total) by mouth at bedtime.   Marland Kitchen terazosin (HYTRIN) 5 MG capsule Take 1 capsule by mouth at bedtime   . fluorouracil (EFUDEX) 5 % cream APPLY TO AFFECTED AREAS AT BEDTIME FOR 3 WEEKS.  (Patient not taking: Reported on 07/11/2020)   . [DISCONTINUED] benzonatate (TESSALON) 100 MG capsule Take 2 capsules (200 mg total) by mouth 3 (three) times daily as needed for cough.   . [DISCONTINUED] Dextromethorphan Polistirex (DELSYM PO) Take 10 mLs by mouth as needed. 06/13/2020: Took this am   No facility-administered encounter medications on file as of 07/11/2020.   Allergies  Allergen Reactions  . Penicillins Rash    Has patient had a PCN reaction causing immediate rash, facial/tongue/throat swelling, SOB or lightheadedness with hypotension: No Has patient had a PCN reaction causing severe rash involving mucus membranes or skin necrosis: No Has patient had a PCN reaction that required hospitalization: No Has patient had a PCN reaction occurring within the last 10 years: No If all of the above answers are "NO", then may proceed with Cephalosporin use.     ROS: No fever, chills, URI symptoms, headaches, dizziness, chest pain, GI complaints.  No chest pain, palpitations, dyspnea on exertion.  Denies dysuria, hematuria. Denies numbness, tingling, weakness, tremor,rashes or abnormal bleeding (easily bruises if he bumps his arms).  Rare heartburn, about once a month after rich foods, uses Tums prn/ Hearing loss--has hearing aids Intermittently feels like his balance is a little off--he had PT for balance, and this has improved.  Infrequent now. Denies falls. No longer having any ankle swelling.    PHYSICAL EXAM:  BP 118/68   Pulse 72   Ht 5' 10.75" (1.797 m)   Wt 199 lb 6.4 oz (90.4 kg)   BMI 28.01 kg/m   Wt Readings from Last 3 Encounters:  07/11/20 199 lb 6.4 oz (90.4 kg)  06/13/20 196 lb (88.9 kg)  05/17/20 201 lb 3.2 oz (91.3 kg)    Well developed, pleasant elderly male in no distress  HEENT: PERRL EOMI, conjunctiva and sclera are clear. Wearing mask due to COVID-19-pandemic. Bilateral TMs and EACs are normal. Neck: no lymphadenopathy, thyromegaly or carotid bruit   Heart:  Somewhat irregularly irregular, slow (never rapid). No murmur Lungs: clear bilaterally  Abdomen: soft, nontender,no mass.  Back: no CVA tenderness or spinal tenderness. Significant curvature of the lumbar spine, unchanged (reverse C curve, prominent spine on the R) Extremities: no edema, 2+ pulse  Skin: actinic changes and some purpura L>R. Psych: normal mood, affect, hygiene and grooming  Neuro: alert and oriented, normal gait.   Lab Results  Component Value Date   HGBA1C 7.0 (H) 07/09/2020   Fasting glucose 141    Chemistry      Component Value Date/Time   NA 139 07/09/2020 0831   K 4.9 07/09/2020 0831   CL 103 07/09/2020 0831   CO2 21 07/09/2020 0831   BUN 26 07/09/2020 0831   CREATININE 1.15 07/09/2020 0831   CREATININE 1.07 01/29/2017 1242      Component Value Date/Time   CALCIUM 9.9 07/09/2020 0831   ALKPHOS 105 07/09/2020 0831   AST 19 07/09/2020 0831   ALT 20 07/09/2020  0831   BILITOT 0.5 07/09/2020 0831     Lab Results  Component Value Date   WBC 7.7 07/09/2020   HGB 13.6 07/09/2020   HCT 40.8 07/09/2020   MCV 95 07/09/2020   PLT 248 07/09/2020   Lab Results  Component Value Date   TSH 6.500 (H) 07/09/2020    ASSESSMENT/PLAN:  Diabetes mellitus with microalbuminuria (HCC) - Borderline control; discussed improving diet vs increasing metformin, pt would like to do both. Incr metformin to 1565m daily. SE reviewed. Diet reviewed - Plan: lisinopril (ZESTRIL) 5 MG tablet, metFORMIN (GLUCOPHAGE-XR) 500 MG 24 hr tablet, Microalbumin / creatinine urine ratio  Subclinical hypothyroidism - remains asymptomatic, no treatment indicated. Cont regular monitoring - Plan: TSH  Atrial fibrillation, unspecified type ([place] - rate controlled without medication; continue anticoagulants  Senile purpura (HCC)  Anticoagulant long-term use - blood counts stable, continue for stroke prevention from atrial fibrillation - Plan: CBC with  Differential/Platelet  Type 2 diabetes mellitus with hypercholesterolemia ([place] - Plan: Hemoglobin A1c, Lipid panel, Microalbumin / creatinine urine ratio, Comprehensive metabolic panel  BPH (benign prostatic hyperplasia) - controlled with Hytrin. Sees urologist yearly - Plan: terazosin (HYTRIN) 5 MG capsule  Counseled re: diet, exercise, some weight loss encouraged (excess abdominal fat). Increase metformin to 15063mdaily--if unable to tolerate taken together, can split it up (vs decrease back to 100055mnd work harder on diet).  F/u in 6 months for AWV/med check with labs prior--A1c, CBC, c-met, TSH, urine microalb/Cr, lipids

## 2020-07-11 ENCOUNTER — Other Ambulatory Visit: Payer: Self-pay

## 2020-07-11 ENCOUNTER — Ambulatory Visit (INDEPENDENT_AMBULATORY_CARE_PROVIDER_SITE_OTHER): Payer: Medicare Other | Admitting: Family Medicine

## 2020-07-11 ENCOUNTER — Encounter: Payer: Self-pay | Admitting: Family Medicine

## 2020-07-11 VITALS — BP 118/68 | HR 72 | Ht 70.75 in | Wt 199.4 lb

## 2020-07-11 DIAGNOSIS — I4891 Unspecified atrial fibrillation: Secondary | ICD-10-CM | POA: Diagnosis not present

## 2020-07-11 DIAGNOSIS — Z7901 Long term (current) use of anticoagulants: Secondary | ICD-10-CM

## 2020-07-11 DIAGNOSIS — D692 Other nonthrombocytopenic purpura: Secondary | ICD-10-CM

## 2020-07-11 DIAGNOSIS — E1169 Type 2 diabetes mellitus with other specified complication: Secondary | ICD-10-CM

## 2020-07-11 DIAGNOSIS — E7800 Pure hypercholesterolemia, unspecified: Secondary | ICD-10-CM

## 2020-07-11 DIAGNOSIS — N400 Enlarged prostate without lower urinary tract symptoms: Secondary | ICD-10-CM

## 2020-07-11 DIAGNOSIS — R809 Proteinuria, unspecified: Secondary | ICD-10-CM

## 2020-07-11 DIAGNOSIS — E038 Other specified hypothyroidism: Secondary | ICD-10-CM

## 2020-07-11 DIAGNOSIS — E1129 Type 2 diabetes mellitus with other diabetic kidney complication: Secondary | ICD-10-CM | POA: Diagnosis not present

## 2020-07-11 MED ORDER — TERAZOSIN HCL 5 MG PO CAPS: 5.0000 mg | ORAL_CAPSULE | Freq: Every day | ORAL | 1 refills | Status: DC

## 2020-07-11 MED ORDER — LISINOPRIL 5 MG PO TABS: 5.0000 mg | ORAL_TABLET | Freq: Every day | ORAL | 3 refills | Status: DC

## 2020-07-11 MED ORDER — METFORMIN HCL ER 500 MG PO TB24: 1500.0000 mg | ORAL_TABLET | Freq: Every day | ORAL | 1 refills | Status: DC

## 2020-07-11 NOTE — Patient Instructions (Signed)
Your A1c and sugars are up. We are increasing your metformin to 3 pills daily. You can take all 3 together before breakfast. If you get any diarrhea or side effects, you can separate them (take 1 pill before dinner, and 2 before breakfast). Let us know if you have any ongoing problems--side effetcts, or if your sugars remain above 135-140. Periodically check your sugars at bedtime (or 2-3 hours after eating)--this helps let you know the effect of what you ate that evening on your blood sugar. Please try and cut back on the carbs in your diet (either smaller portions or decreased frequency of carbs such as pasta, breads, rice, potatoes). Continue to try and get regular exercise.

## 2020-08-13 ENCOUNTER — Encounter: Payer: Self-pay | Admitting: Family Medicine

## 2020-08-13 ENCOUNTER — Other Ambulatory Visit: Payer: Self-pay

## 2020-08-13 ENCOUNTER — Ambulatory Visit (INDEPENDENT_AMBULATORY_CARE_PROVIDER_SITE_OTHER): Payer: Medicare Other | Admitting: Family Medicine

## 2020-08-13 VITALS — BP 130/70 | HR 72 | Ht 71.0 in | Wt 199.0 lb

## 2020-08-13 DIAGNOSIS — R200 Anesthesia of skin: Secondary | ICD-10-CM

## 2020-08-13 DIAGNOSIS — E038 Other specified hypothyroidism: Secondary | ICD-10-CM | POA: Diagnosis not present

## 2020-08-13 DIAGNOSIS — R202 Paresthesia of skin: Secondary | ICD-10-CM

## 2020-08-13 DIAGNOSIS — E1169 Type 2 diabetes mellitus with other specified complication: Secondary | ICD-10-CM

## 2020-08-13 DIAGNOSIS — E7800 Pure hypercholesterolemia, unspecified: Secondary | ICD-10-CM

## 2020-08-13 NOTE — Patient Instructions (Addendum)
If the sleep aid you are taking is diphenhydramine, it is fine to use periodically, such as if you haven't had a good night sleep for a few days, and need to catch up on sleep.  Don't think it in the middle of the night, take it before bedtime.  If you take it too late, you will have residual grogginess into the morning which could be dangerous, especially if driving (it lasts up to 6-8 hours).  We are checking B12 levels and rechecking your thyroid. If the B12 is normal, but the thyroid level remains as high (or higher) than at last check, we will start you on thyroid medication.

## 2020-08-13 NOTE — Progress Notes (Signed)
Chief Complaint  Patient presents with   Numbness    For the last week has been having numbness on the bottoms of his feet, feels like he has to be very careful when he is walking since it makes him a little off balance. Also has been waking up in the middle of the night and takes about an hour to get back to sleep. Waking up jittery. FBS today 118, 132 yesterday. Wants to know if he is due for a pneumonia vaccine.     He noticed numbness in his feet just in the last 1-2 weeks. It is constant, sometimes more than others.  He sometimes feels tingling, other times is just numb. Never burning or painful. It is along the whole arch and bottom of his foot. Denies tingling in fingers.  Patient is a diabetic, and his last A1c was up at 7%--metformin dose was increased from 1000mg  to 1500mg . He denies any side effects. Sugars have been 129 (Fri), 132 yesterday, 118 this morning.  Some mornings he feels jittery (shaky in hands); not jittery in heart, or stomach, not anxious-feeling. This tends to be when he didn't sleep well at night.  He took a "sleep pill" at 2am, and didn't feel jittery this morning.  [employer] sleep-aid, 25mg  (he thinks per serving, comes as 2 in a pack, took just 1).  He has had subclinical hypothyroidism.  Lab Results  Component Value Date   TSH 6.500 (H) 07/09/2020   PMH, PSH, SH reviewed  Outpatient Encounter Medications as of 08/13/2020  Medication Sig Note   Calcium Carbonate-Vitamin D 600-400 MG-UNIT tablet Take 1 tablet by mouth 2 (two) times daily.    Coenzyme Q10 (COQ10) 100 MG CAPS Take 100 mg by mouth daily at 12 noon. 07/11/2020: Taking 200mg  daily   ELIQUIS 5 MG TABS tablet Take 1 tablet by mouth twice daily    Glucosamine-Chondroitin (COSAMIN DS PO) Take 1 tablet by mouth 2 (two) times daily. With lunch & supper    glucose blood (ONETOUCH VERIO) test strip USE 1 STRIP TO CHECK GLUCOSE ONCE DAILY DX:E11.69    Lancets (ONETOUCH DELICA PLUS [id number]) MISC USE 1  TO  CHECK GLUCOSE ONCE DAILY    lisinopril (ZESTRIL) 5 MG tablet Take 1 tablet (5 mg total) by mouth daily.    loratadine (CLARITIN) 10 MG tablet Take 10 mg by mouth daily.    metFORMIN (GLUCOPHAGE-XR) 500 MG 24 hr tablet Take 3 tablets (1,500 mg total) by mouth daily with breakfast.    Multiple Vitamins-Minerals (MULTIVITAMIN WITH MINERALS) tablet Take 1 tablet by mouth daily.    Omega-3 Fatty Acids (FISH OIL PO) Take 1,600 mg by mouth daily with supper.     simvastatin (ZOCOR) 20 MG tablet Take 1 tablet (20 mg total) by mouth at bedtime.    terazosin (HYTRIN) 5 MG capsule Take 1 capsule (5 mg total) by mouth at bedtime.    acetaminophen (TYLENOL) 325 MG tablet Take 650 mg by mouth every 6 (six) hours as needed for mild pain, moderate pain or headache. (Patient not taking: Reported on 08/13/2020)    fluorouracil (EFUDEX) 5 % cream APPLY TO AFFECTED AREAS AT BEDTIME FOR 3 WEEKS. (Patient not taking: No sig reported)    No facility-administered encounter medications on file as of 08/13/2020.    ROS: no fever, chills, change in energy, weight changes, hair/skin/bowel changes.  No back pain, no leg pain.  Numbness per [facility].   PHYSICAL EXAM:  BP 130/70   Pulse  72   Ht 5\' 11"  (1.803 m)   Wt 199 lb (90.3 kg)   BMI 27.75 kg/m  Well appearing, elderly male, in good spirits, in no distress HEENT: conjunctiva and sclera are clear, EOMI, wearing mask Heart: regular rate and rhythm Lungs: clear bilaterally Feet: 2+ pulses, no edema Normal sensation to monofilament.  Lots of erythematous papules on legs, actinic damage on arms.   ASSESSMENT/PLAN:  Numbness in feet - not dermatomal, across bottom of foot. Ddx reviewed with pt. DM is not uncontrolled, no back pain, radicular sx. Check B12 - Plan: Vitamin B12  Paresthesia - Plan: Vitamin B12, TSH  Type 2 diabetes mellitus with hypercholesterolemia ([place]) - recent A1c above goal, but meds adjusted, sugars improved.   Subclinical hypothyroidism -  consider treatment if no other cause for foot numbness is found, if symptoms persist or worsen - Plan: TSH   Pt reassured that he is not due for any pneumonia vaccines. Recent CBC normal Given that metformin dose was recently increased, will check B12 level. If B12 is normal and TSH is still elevated, start synthroid  He is to let us know if symptoms change

## 2020-08-14 LAB — VITAMIN B12: Vitamin B-12: 469 pg/mL (ref 232–1245)

## 2020-08-14 LAB — TSH: TSH: 4.75 u[IU]/mL — ABNORMAL HIGH (ref 0.450–4.500)

## 2020-08-14 NOTE — Progress Notes (Signed)
Please advise pt that his B12 level is normal (there is no issue in taking extra if his multivitamin only has small amount; should get 1 mg or 1000 mcg daily) His thyroid test is closer to normal than on his recent check.  So I don't think it is the thyroid causing his foot numbness.  The most likely reason would be a diabetic neuropathy, and keeping the sugars down is the answer.  If, however, his numbness persists and worsens, he should let us know.

## 2020-08-22 ENCOUNTER — Telehealth: Payer: Self-pay | Admitting: *Deleted

## 2020-08-22 NOTE — Telephone Encounter (Signed)
Patient called and he tried the diphenhydramine as recommended from [facility] for sleep, he actually slept less he felt. He called to see if there was anything you could call in to help with sleep.

## 2020-08-22 NOTE — Telephone Encounter (Signed)
We didn't have a detailed discussion of his sleep.  He had mentioned that he had taken a sleep aid from [facility] and his shakiness had seemed better.  He can try melatonin for sleep, if the diphenhydramine isn't helpful.  Otherwise, can schedule visit to discuss options for treating sleep. (Needs visit for any rx's, there are many to be discussed, including the risks/side effects).

## 2020-08-22 NOTE — Telephone Encounter (Signed)
Patient advised.

## 2020-09-24 ENCOUNTER — Other Ambulatory Visit: Payer: Self-pay | Admitting: Cardiology

## 2020-09-25 NOTE — Telephone Encounter (Signed)
Prescription refill request for Eliquis received. Indication:afib Last office visit:jordan 09/06/19 Scr:1.15 07/09/20 Age: 40mWeight:90.3kg

## 2020-10-01 LAB — HM DIABETES EYE EXAM

## 2020-10-03 ENCOUNTER — Encounter: Payer: Self-pay | Admitting: *Deleted

## 2020-12-11 ENCOUNTER — Other Ambulatory Visit (INDEPENDENT_AMBULATORY_CARE_PROVIDER_SITE_OTHER): Payer: Medicare Other

## 2020-12-11 ENCOUNTER — Other Ambulatory Visit: Payer: Self-pay

## 2020-12-11 DIAGNOSIS — Z23 Encounter for immunization: Secondary | ICD-10-CM

## 2021-01-07 ENCOUNTER — Other Ambulatory Visit: Payer: Self-pay | Admitting: *Deleted

## 2021-01-07 DIAGNOSIS — N400 Enlarged prostate without lower urinary tract symptoms: Secondary | ICD-10-CM

## 2021-01-07 MED ORDER — TERAZOSIN HCL 5 MG PO CAPS: 5.0000 mg | ORAL_CAPSULE | Freq: Every day | ORAL | 0 refills | Status: DC

## 2021-01-14 ENCOUNTER — Other Ambulatory Visit: Payer: Self-pay | Admitting: Family Medicine

## 2021-01-14 DIAGNOSIS — E1129 Type 2 diabetes mellitus with other diabetic kidney complication: Secondary | ICD-10-CM

## 2021-01-28 ENCOUNTER — Other Ambulatory Visit: Payer: Self-pay

## 2021-01-28 ENCOUNTER — Other Ambulatory Visit: Payer: Medicare Other

## 2021-01-28 ENCOUNTER — Other Ambulatory Visit: Payer: Self-pay | Admitting: Family Medicine

## 2021-01-28 DIAGNOSIS — Z7901 Long term (current) use of anticoagulants: Secondary | ICD-10-CM

## 2021-01-28 DIAGNOSIS — E038 Other specified hypothyroidism: Secondary | ICD-10-CM

## 2021-01-28 DIAGNOSIS — E1129 Type 2 diabetes mellitus with other diabetic kidney complication: Secondary | ICD-10-CM

## 2021-01-28 DIAGNOSIS — R809 Proteinuria, unspecified: Secondary | ICD-10-CM

## 2021-01-28 DIAGNOSIS — E7800 Pure hypercholesterolemia, unspecified: Secondary | ICD-10-CM

## 2021-01-28 DIAGNOSIS — E1169 Type 2 diabetes mellitus with other specified complication: Secondary | ICD-10-CM

## 2021-01-28 NOTE — Progress Notes (Signed)
Cardiology Office Note   Date:  02/01/2021   ID:  Andre Walker, DOB 07-10-1932, MRN [id number]  PCP:  Rita Ohara, MD  Cardiologist:   Zamauri Nez Martin[place], MD   Chief Complaint  Patient presents with   Atrial Fibrillation       History of Present Illness: Andre Walker is a 87 y.o. male who is seen for follow up  atrial fibrillation. He has a history of DM and HLD. No known history of cardiac disease. He was admitted in early December 2020 with acute cholecystitis. He was noted to be in atrial fibrillation with controlled rate. He underwent cholecystectomy as well as ERCP with sphincterotomy and stent. Was not started on anticoagulation due to surgery. Rate remained controlled on no rate control medication. Echo was done showing EF 50-55%. Moderate LAE. No significant valvular disease. [place] vasc score is 3. He was subsequently started on Eliquis as outpatient.   He denies any palpitations, dizziness, chest pain or SOB. He does note more problems with his balance.  He plays golf.  No bleeding on Eliquis. Lives at [facility].   Past Medical History:  Diagnosis Date   Actinic keratosis    Dr. Allyson Sabal   Atrial fibrillation [facility])    BPH (benign prostatic hyperplasia)    controlled on Hytrin   Colon polyps    Dr. Watt Climes   Diabetes mellitus 2004   Diverticulosis    ED (erectile dysfunction)    Gallstones    Hearing loss    high frequency   Hiatal hernia    small, noted on EGD 10/2010   LAE (left atrial enlargement) 01/2019   Moderate, Noted on ECHO   Melanoma ([place])    right arm   Microalbuminuria    on ACEI for microalbuminuria (does NOT have HTN)   Pneumonia 01/2019   Bilateral, noted on CT   Pure hypercholesterolemia 2006   Traumatic partial tear of biceps tendon 01/2009   "popeye" injury on right   Wears glasses    Wears hearing aid in both ears    Wears partial dentures    lower   Wears partial dentures    lower    Past Surgical History:  Procedure Laterality  Date   [place] N/A 01/28/2019   Procedure: BILIARY STENT PLACEMENT;  Surgeon: Arta Silence, MD;  Location: [facility];  Service: Endoscopy;  Laterality: N/A;   CATARACT EXTRACTION Bilateral R 05/07/15, L 05/21/15   Dr. Katy Fitch   CHOLECYSTECTOMY N/A 01/27/2019   Procedure: LAPAROSCOPIC CHOLECYSTECTOMY WITH INTRAOPEREATIVE CHOLANGIOGRAM;  Surgeon: Alphonsa Overall, MD;  Location: [facility];  Service: General;  Laterality: N/A;   COLONOSCOPY  11/09, 10/29/10   Dr. Watt Climes   ERCP N/A 01/28/2019   Procedure: ENDOSCOPIC RETROGRADE CHOLANGIOPANCREATOGRAPHY (ERCP);  Surgeon: Arta Silence, MD;  Location: [facility];  Service: Endoscopy;  Laterality: N/A;   ERCP N/A 04/12/2019   Procedure: ENDOSCOPIC RETROGRADE CHOLANGIOPANCREATOGRAPHY (ERCP);  Surgeon: Clarene Essex, MD;  Location: [facility];  Service: Endoscopy;  Laterality: N/A;  stent removal   ESOPHAGOGASTRODUODENOSCOPY  10/29/10   Dr. Watt Climes   INGUINAL HERNIA REPAIR Right 1989   MELANOMA EXCISION  10/08/10   R upper arm, Dr. Hassell Done   MELANOMA EXCISION  03/16/2012   Procedure: MELANOMA EXCISION;  Surgeon: Pedro Earls, MD;  Location: [place];  Service: General;  Laterality: Right;  excision of melanoma of right arm   REMOVAL OF STONES  01/28/2019  Procedure: REMOVAL OF STONES;  Surgeon: Arta Silence, MD;  Location: [facility];  Service: Endoscopy;;   SPHINCTEROTOMY  01/28/2019   Procedure: Joan Mayans;  Surgeon: Arta Silence, MD;  Location: [facility];  Service: Endoscopy;;   SPHINCTEROTOMY  04/12/2019   Procedure: Joan Mayans;  Surgeon: Clarene Essex, MD;  Location: [facility];  Service: Endoscopy;;  stone sludge   STENT REMOVAL  04/12/2019   Procedure: STENT REMOVAL;  Surgeon: Clarene Essex, MD;  Location: [facility];  Service: Endoscopy;;   TOTAL KNEE ARTHROPLASTY Right 2012   TOTAL KNEE ARTHROPLASTY Left 2005     Current Outpatient Medications  Medication Sig Dispense  Refill   acetaminophen (TYLENOL) 325 MG tablet Take 325 mg by mouth every 6 (six) hours as needed for mild pain, moderate pain or headache.     apixaban (ELIQUIS) 5 MG TABS tablet Take 1 tablet by mouth twice daily 180 tablet 1   Calcium Carbonate-Vitamin D 600-400 MG-UNIT tablet Take 1 tablet by mouth 2 (two) times daily.     Coenzyme Q10 (COQ10) 100 MG CAPS Take 100 mg by mouth daily at 12 noon.     fluorouracil (EFUDEX) 5 % cream APPLY TO AFFECTED AREAS AT BEDTIME FOR 3 WEEKS.     Glucosamine-Chondroitin (COSAMIN DS PO) Take 1 tablet by mouth 2 (two) times daily. With lunch & supper     glucose blood (ONETOUCH VERIO) test strip USE 1 STRIP TO CHECK GLUCOSE ONCE DAILY DX:E11.69 100 each 3   Lancets (ONETOUCH DELICA PLUS MVHQIO96E) MISC USE 1  TO CHECK GLUCOSE ONCE DAILY 100 each 2   lisinopril (ZESTRIL) 5 MG tablet Take 1 tablet (5 mg total) by mouth daily. 90 tablet 3   loratadine (CLARITIN) 10 MG tablet Take 10 mg by mouth daily.     metFORMIN (GLUCOPHAGE-XR) 500 MG 24 hr tablet TAKE 3 TABLETS BY MOUTH ONCE DAILY WITH BREAKFAST 270 tablet 0   Multiple Vitamins-Minerals (MULTIVITAMIN WITH MINERALS) tablet Take 1 tablet by mouth daily.     Omega-3 Fatty Acids (FISH OIL PO) Take 1,600 mg by mouth daily with supper.      simvastatin (ZOCOR) 20 MG tablet Take 1 tablet (20 mg total) by mouth at bedtime. 90 tablet 3   terazosin (HYTRIN) 5 MG capsule Take 1 capsule (5 mg total) by mouth at bedtime. 90 capsule 0   No current facility-administered medications for this visit.    Allergies:   Penicillins    Social History:  The patient  reports that he has never smoked. He has never used smokeless tobacco. He reports current alcohol use. He reports that he does not use drugs.   Family History:  The patient's family history includes Cancer in his paternal uncle; Diabetes in his brother, sister, and sister; Heart disease in his brother and paternal uncle; Hyperlipidemia in his brother; Hypertension in  his brother; Stroke in his brother.    ROS:  Please see the history of present illness.   Otherwise, review of systems are positive for none.   All other systems are reviewed and negative.    PHYSICAL EXAM: VS:  BP 120/60 (BP Location: Left Arm)   Pulse 67   Ht 5' 10.5" (1.791 m)   Wt 202 lb 6.4 oz (91.8 kg)   SpO2 97%   BMI 28.63 kg/m  , BMI Body mass index is 28.63 kg/m. GEN: Well nourished, well developed, in no acute distress  HEENT: normal  Neck: no JVD, carotid bruits, or masses Cardiac: IRRR; no  murmurs, rubs, or gallops,no edema  Respiratory:  clear to auscultation bilaterally, normal work of breathing GI: soft, nontender, nondistended, + BS MS: no deformity or atrophy  Skin: warm and dry, no rash Neuro:  Strength and sensation are intact Psych: euthymic mood, full affect   EKG:  EKG is ordered today. It shows Afib rate 67.  Otherwise Normal. I have personally reviewed and interpreted this study.   Recent Labs: 01/28/2021: ALT 18; BUN 28; Creatinine, Ser 1.19; Hemoglobin 14.6; Platelets 266; Potassium 5.0; Sodium 143; TSH 7.920    Lipid Panel    Component Value Date/Time   CHOL 140 01/28/2021 0842   TRIG 107 01/28/2021 0842   HDL 47 01/28/2021 0842   CHOLHDL 3.0 01/28/2021 0842   CHOLHDL 3.0 11/26/2016 0745   VLDL 15 11/07/2015 0728   LDLCALC 73 01/28/2021 0842   LDLCALC 57 11/26/2016 0745      Wt Readings from Last 3 Encounters:  02/01/21 202 lb 6.4 oz (91.8 kg)  01/30/21 198 lb 12.8 oz (90.2 kg)  08/13/20 199 lb (90.3 kg)      Other studies Reviewed: Additional studies/ records that were reviewed today include:    Echo 01/25/19:IMPRESSIONS      1. Left ventricular ejection fraction, by visual estimation, is 50 to 55%. The left ventricle has low normal function. There is no left ventricular hypertrophy.  2. Left ventricular diastolic parameters are indeterminate.  3. Global right ventricle has normal systolic function.The right ventricular size  is normal. No increase in right ventricular wall thickness.  4. Left atrial size was moderately dilated.  5. Right atrial size was normal.  6. The mitral valve is normal in structure. No evidence of mitral valve regurgitation. No evidence of mitral stenosis.  7. The tricuspid valve is normal in structure. Tricuspid valve regurgitation is mild.  8. The aortic valve is tricuspid. Aortic valve regurgitation is mild. Mild aortic valve sclerosis without stenosis.  9. The pulmonic valve was grossly normal. Pulmonic valve regurgitation is mild. 10. Aortic dilatation noted. 11. There is mild dilatation of the aortic root measuring 39 mm. 12. Mildly elevated pulmonary artery systolic pressure. 13. The inferior vena cava is normal in size with greater than 50% respiratory variability, suggesting right atrial pressure of 3 mmHg.    ASSESSMENT AND PLAN:  1.  Atrial fibrillation persistent. [place] vasc score of 3. Rate is controlled on no rate control therapy. He is asymptomatic. I would recommend continued rate control only. On Eliquis 5 mg bid.  2. DM type 2 A1c 6.2%.   3. HTN controlled.   Current medicines are reviewed at length with the patient today.  The patient does not have concerns regarding medicines.  The following changes have been made:  See above.   Labs/ tests ordered today include:   No orders of the defined types were placed in this encounter.    Disposition:   FU with me in one year   Signed, Taiten Brawn [place], MD  02/01/2021 11:58 AM    [facility] [address] [place], [place], [address] Phone [phone], Fax [phone]

## 2021-01-29 LAB — COMPREHENSIVE METABOLIC PANEL
ALT: 18 IU/L (ref 0–44)
AST: 15 IU/L (ref 0–40)
Albumin/Globulin Ratio: 1.8 (ref 1.2–2.2)
Albumin: 4.6 g/dL (ref 3.6–4.6)
Alkaline Phosphatase: 98 IU/L (ref 44–121)
BUN/Creatinine Ratio: 24 (ref 10–24)
BUN: 28 mg/dL — ABNORMAL HIGH (ref 8–27)
Bilirubin Total: 0.7 mg/dL (ref 0.0–1.2)
CO2: 22 mmol/L (ref 20–29)
Calcium: 10.2 mg/dL (ref 8.6–10.2)
Chloride: 105 mmol/L (ref 96–106)
Creatinine, Ser: 1.19 mg/dL (ref 0.76–1.27)
Globulin, Total: 2.5 g/dL (ref 1.5–4.5)
Glucose: 148 mg/dL — ABNORMAL HIGH (ref 70–99)
Potassium: 5 mmol/L (ref 3.5–5.2)
Sodium: 143 mmol/L (ref 134–144)
Total Protein: 7.1 g/dL (ref 6.0–8.5)
eGFR: 59 mL/min/{1.73_m2} — ABNORMAL LOW (ref >59)

## 2021-01-29 LAB — HEMOGLOBIN A1C
Est. average glucose Bld gHb Est-mCnc: 148 mg/dL
Hgb A1c MFr Bld: 6.8 % — ABNORMAL HIGH (ref 4.8–5.6)

## 2021-01-29 LAB — LIPID PANEL
Chol/HDL Ratio: 3 ratio (ref 0.0–5.0)
Cholesterol, Total: 140 mg/dL (ref 100–199)
HDL: 47 mg/dL (ref >39)
LDL Chol Calc (NIH): 73 mg/dL (ref 0–99)
Triglycerides: 107 mg/dL (ref 0–149)
VLDL Cholesterol Cal: 20 mg/dL (ref 5–40)

## 2021-01-29 LAB — CBC WITH DIFFERENTIAL/PLATELET
Basophils Absolute: 0 10*3/uL (ref 0.0–0.2)
Basos: 0 %
EOS (ABSOLUTE): 0.3 10*3/uL (ref 0.0–0.4)
Eos: 3 %
Hematocrit: 42 % (ref 37.5–51.0)
Hemoglobin: 14.6 g/dL (ref 13.0–17.7)
Immature Grans (Abs): 0 10*3/uL (ref 0.0–0.1)
Immature Granulocytes: 0 %
Lymphocytes Absolute: 2.8 10*3/uL (ref 0.7–3.1)
Lymphs: 34 %
MCH: 33 pg (ref 26.6–33.0)
MCHC: 34.8 g/dL (ref 31.5–35.7)
MCV: 95 fL (ref 79–97)
Monocytes Absolute: 0.7 10*3/uL (ref 0.1–0.9)
Monocytes: 8 %
Neutrophils Absolute: 4.5 10*3/uL (ref 1.4–7.0)
Neutrophils: 55 %
Platelets: 266 10*3/uL (ref 150–450)
RBC: 4.43 x10E6/uL (ref 4.14–5.80)
RDW: 11.6 % (ref 11.6–15.4)
WBC: 8.3 10*3/uL (ref 3.4–10.8)

## 2021-01-29 LAB — MICROALBUMIN / CREATININE URINE RATIO
Creatinine, Urine: 71.5 mg/dL
Microalb/Creat Ratio: 23 mg/g creat (ref 0–29)
Microalbumin, Urine: 16.8 ug/mL

## 2021-01-29 LAB — TSH: TSH: 7.92 u[IU]/mL — ABNORMAL HIGH (ref 0.450–4.500)

## 2021-01-29 NOTE — Patient Instructions (Addendum)
  HEALTH MAINTENANCE RECOMMENDATIONS:  It is recommended that you get at least 30 minutes of aerobic exercise at least 5 days/week (for weight loss, you may need as much as 60-90 minutes). This can be any activity that gets your heart rate up. This can be divided in 10-15 minute intervals if needed, but try and build up your endurance at least once a week.  Weight bearing exercise is also recommended twice weekly.  Eat a healthy diet with lots of vegetables, fruits and fiber.  "Colorful" foods have a lot of vitamins (ie green vegetables, tomatoes, red peppers, etc).  Limit sweet tea, regular sodas and alcoholic beverages, all of which has a lot of calories and sugar.  Up to 2 alcoholic drinks daily may be beneficial for men (unless trying to lose weight, watch sugars).  Drink a lot of water.  Sunscreen of at least SPF 30 should be used on all sun-exposed parts of the skin when outside between the hours of 10 am and 4 pm (not just when at beach or pool, but even with exercise, golf, tennis, and yard work!)  Use a sunscreen that says "broad spectrum" so it covers both UVA and UVB rays, and make sure to reapply every 1-2 hours.  Remember to change the batteries in your smoke detectors when changing your clock times in the spring and fall.  Carbon monoxide detectors are recommended for your home.  Use your seat belt every time you are in a car, and please drive safely and not be distracted with cell phones and texting while driving.    Mr. Schoenfeld , Thank you for taking time to come for your Medicare Wellness Visit. I appreciate your ongoing commitment to your health goals. Please review the following plan we discussed and let me know if I can assist you in the future.   This is a list of the screening recommended for you and due dates:  Health Maintenance  Topic Date Due   Tetanus Vaccine  12/01/2020   Complete foot exam   01/15/2021   Hemoglobin A1C  07/29/2021   Eye exam for diabetics  10/01/2021    Pneumonia Vaccine  Completed   Flu Shot  Completed   COVID-19 Vaccine  Completed   Zoster (Shingles) Vaccine  Completed   HPV Vaccine  Aged Out   You are due for a tetanus booster (TdaP). You will need to get this from the pharmacy. I believe there has been a recent change, and in January there should no longer be any copay or out of pocket cost for you to get this from the pharmacy.  (If there is no extra cost now, feel free to get it prior to January; you can ask the pharmacist about this).  If you aren't having any urinary problems, you probably can skip seeing the urologist (since I'm doing the prostate exam and prescribing your Hytrin).

## 2021-01-29 NOTE — Progress Notes (Signed)
Chief Complaint  Patient presents with   Medicare Wellness    Nonfasting (labs already done) AWV/CPE. Does not have any concerns. Does mention he has to walk more slowly, but he is not having any issues. Seeing Dr. Martinique seeing this Friday for annual check up. He was unaware that he needed Tdap-he will get at [facility]. Dr. Gilford Rile (uro) doesn't fill Hytrin because he has never asked him to.     Andre Walker is a 87 y.o. male who presents for annual wellness visit and follow-up on chronic medical conditions.  He had labs done prior to his visit (see below.)   He was last seen in June with complaint of numbness in his feet.  It was felt to be related to diabetes (had normal B12 level and TSH was borderline/improved).  He reports the numbness has completely resolved, didn't last too long. A1c was up at 7% in May, and metformin dose was increased from 1064m to 15063mat  Denies any sores or lesions on his feet. Sugars are running 109-140 in the mornings (doesn't check other times). He reports very little sweets (1 dessert a week). He cut back on serving sizes of bread, pasta, rice.  Denies hypoglycemia, polydipsia and polyuria.  Last eye exam was in 09/2020, no retinopathy.  He has h/o microalbuminuria, and continues on lisinopril without side effects. Denies dizziness or cough.   Hyperlipidemia follow-up: Patient is reportedly following a low-fat, low cholesterol diet. Compliant with medications (simvastatin) and denies medication side effects.   Atrial fibrillation -- Noted after cholecystectomy, has persisted.  He last saw Dr. JoMartiniquen 02/2020, scheduled to see him again later this week. He continues on anticoagulants. He has not required any rate-controlling medications.  He has some easy bruising, but denies any bleeding. He denies chest pain, palpitations, shortness of breath.   BPH:  Under the care of [facility] (since getting admitted 01/2017, had UTI, AKI). He had been doing well  on Hytrin. He is under the care of Dr. WiGilford Rile Last year he reported getting up 1-2x/night to void. At his 10/2020 visit with urologist, he noted increase to 2-3x/night.  He was given rx for oxybutynin 57m28mo try, along with discussion of behavioral measures.  He started the medication, had mouth dryness, made him feel nervous, couldn't tolerate it so he stopped it.  He was up twice last night. He doesn't get GU exams done there, and we have been prescribing the Hytrin.  Subclinical hypothyroidism: He has had some borderline and elevated TSH values in the past, never symptomatic. TSH was normal in 12/2018, in the 6's prior to that, and went up to 7.690 in 12/2019, down to 4.75 in 07/2020. He denies fatigue, cold intolerance, hair/skin/nail changes.     Immunization History  Administered Date(s) Administered   Fluad Quad(high Dose 65+) 12/22/2018, 11/17/2019, 12/11/2020   Influenza Split 11/25/2007, 10/26/2008, 12/02/2010, 12/03/2011   Influenza, High Dose Seasonal PF 12/14/2009, 12/01/2012, 11/24/2013, 12/12/2013, 12/06/2014, 12/15/2014, 11/13/2015, 11/26/2016, 12/07/2017   Moderna Covid-19 Vaccine Bivalent Booster [id number] 12/11/2020   Moderna SARS-COV2 Booster Vaccination 07/09/2020   Moderna Sars-Covid-2 Vaccination 02/28/2019, 03/28/2019, 01/03/2020   Pneumococcal Conjugate-13 02/24/2013, 09/12/2013   Pneumococcal Polysaccharide-23 12/25/2004, 06/04/2011, 06/07/2011, 12/03/2016   Td 04/24/2004   Tdap 12/02/2010   Zoster Recombinat (Shingrix) 06/30/2017, 10/11/2017   Zoster, Live 10/25/2007   Last colonoscopy: 12/2015, showing int/external hemorrhoids, hyperplastic polyp and diverticulosis Last PSA: no longer being screened; under care of urologist.  Dentist: twice  yearly Ophtho: yearly Exercise: He is walking outside daily (weather-permitting), 32-44 mins. Goes to the exercise room 3 days/week, weights, bike (with arms, 30 minutes). Golfs once a week.  Patient Care Team: Rita Ohara, MD as PCP - General (Family Medicine) Dr. Martinique (cardiology) Dr. Lovena Neighbours (urologist)  Dr. Pearline Cables (dermatologist) --sees every 6 months Dr. Katy Fitch (ophtho) --yearly Dr. Basil Dess (dentist)  Dr. Watt Climes (GI)  [facility], didn't [facility] for a year, back to qualifying, but hasn't been back  Got hearing aids fixed at [facility]. Dr. Hassell Done (general surgeon, did melanoma excision, no regular f/u)   Depression Screening: [place] Office Visit from 01/30/2021 in [place]  PHQ-2 Total Score 0        Falls screen:  Fall Risk  01/30/2021 01/16/2020 12/27/2018 12/09/2017 12/03/2016  Falls in the past year? 0 0 0 No No  Number falls in past yr: 0 0 - - -  Injury with Fall? 0 0 - - -  Risk for fall due to : No Fall Risks - - - -  Follow up Falls evaluation completed - - - -     Functional Status Survey: Is the patient deaf or have difficulty hearing?: Yes (B/L hearing aides) Does the patient have difficulty seeing, even when wearing glasses/contacts?: No Does the patient have difficulty concentrating, remembering, or making decisions?: No Does the patient have difficulty walking or climbing stairs?: Yes (just going more slowly and a little off balance) Does the patient have difficulty dressing or bathing?: No Does the patient have difficulty doing errands alone such as visiting a doctor's office or shopping?: No  Mini-Cog Scoring: 5   End of Life Discussion:  Patient has a living will and medical power of attorney   PMH, [place], [place] and FH were reviewed and updated  Outpatient Encounter Medications as of 01/30/2021  Medication Sig Note   acetaminophen (TYLENOL) 325 MG tablet Take 325 mg by mouth every 6 (six) hours as needed for mild pain, moderate pain or headache. 01/30/2021: Last dose 3am   apixaban (ELIQUIS) 5 MG TABS tablet Take 1 tablet by mouth twice daily    Calcium Carbonate-Vitamin D 600-400 MG-UNIT tablet Take 1 tablet by mouth 2 (two) times daily.     Coenzyme Q10 (COQ10) 100 MG CAPS Take 100 mg by mouth daily at 12 noon. 07/11/2020: Taking 227m daily   fluorouracil (EFUDEX) 5 % cream APPLY TO AFFECTED AREAS AT BEDTIME FOR 3 WEEKS.    Glucosamine-Chondroitin (COSAMIN DS PO) Take 1 tablet by mouth 2 (two) times daily. With lunch & supper    glucose blood (ONETOUCH VERIO) test strip USE 1 STRIP TO CHECK GLUCOSE ONCE DAILY DX:E11.69    Lancets (ONETOUCH DELICA PLUS [id number] MISC USE 1  TO CHECK GLUCOSE ONCE DAILY    lisinopril (ZESTRIL) 5 MG tablet Take 1 tablet (5 mg total) by mouth daily.    loratadine (CLARITIN) 10 MG tablet Take 10 mg by mouth daily.    metFORMIN (GLUCOPHAGE-XR) 500 MG 24 hr tablet TAKE 3 TABLETS BY MOUTH ONCE DAILY WITH BREAKFAST    Multiple Vitamins-Minerals (MULTIVITAMIN WITH MINERALS) tablet Take 1 tablet by mouth daily.    Omega-3 Fatty Acids (FISH OIL PO) Take 1,600 mg by mouth daily with supper.     simvastatin (ZOCOR) 20 MG tablet Take 1 tablet (20 mg total) by mouth at bedtime.    terazosin (HYTRIN) 5 MG capsule Take 1 capsule (5 mg total) by mouth at bedtime.  No facility-administered encounter medications on file as of 01/30/2021.   Allergies  Allergen Reactions   Penicillins Rash    Has patient had a PCN reaction causing immediate rash, facial/tongue/throat swelling, SOB or lightheadedness with hypotension: No Has patient had a PCN reaction causing severe rash involving mucus membranes or skin necrosis: No Has patient had a PCN reaction that required hospitalization: No Has patient had a PCN reaction occurring within the last 10 years: No If all of the above answers are "NO", then may proceed with Cephalosporin use.     ROS: The patient denies anorexia, fever, headaches, vision loss, ear pain, hoarseness, chest pain, palpitations, dizziness, syncope, dyspnea on exertion, cough, nausea, vomiting, diarrhea, constipation, abdominal pain, melena, hematochezia, indigestion/heartburn, hematuria,  incontinence, nocturia (just once or twice a night), weakened urine stream, dysuria, genital lesions, numbness, tingling, weakness, tremor, rashes, depression, anxiety, abnormal bleeding (easily bruises if he bumps his arms), or enlarged lymph nodes.   Rare heartburn, about once a month (spicy, spaghetti), uses Tums prn, rarely. 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 Intermittently feels like his balance is a little off, can't walk as fast as he used to; he had PT for balance in the past, which helped a lot. Denies falls. Numbness in feet resolved. Up 2x/night to void.  Rare slight leakage.   PHYSICAL EXAM:  BP 110/68   Pulse 72   Ht 5' 10.5" (1.791 m)   Wt 198 lb 12.8 oz (90.2 kg)   BMI 28.12 kg/m   Wt Readings from Last 3 Encounters:  01/30/21 198 lb 12.8 oz (90.2 kg)  08/13/20 199 lb (90.3 kg)  07/11/20 199 lb 6.4 oz (90.4 kg)    General Appearance:   Alert, cooperative, no distress, appears stated age    Head:   Normocephalic, without obvious abnormality, atraumatic    Eyes:   PERRL, conjunctiva/corneas clear, EOM's intact, fundi normal  Ears:   TM's not visualized (checked last week by hearing folks)  Nose:   Not examined, wearing mask due to COVID-19 pandemic   Throat:   Not examined, wearing mask due to COVID-19 pandemic  Neck:   Supple, no lymphadenopathy; thyroid: no enlargement/ tenderness/nodules; no carotid bruit or JVD    Back:   Spine nontender, no curvature, ROM normal, no CVA tenderness. Scoliosis and prominence/kyphosis of the lower lumbar spine. nontender.  Lungs:   Clear to auscultation bilaterally without wheezes, rales or ronchi; respirations unlabored    Chest Wall:   No tenderness or deformity    Heart:   Irregularly irregular, S1 and S2 normal, no murmur, rub  or gallop.   Breast Exam:   No chest wall tenderness, masses or gynecomastia    Abdomen:   Soft, non-tender, nondistended, normoactive bowel sounds, no masses, no  hepatosplenomegaly. Small, soft, easily reducible umbilical hernia  Genitalia:   Normal male external genitalia without lesions. Testicles without masses. No inguinal hernias.    Rectal:   Normal sphincter tone, no masses or tenderness; very soft, guaiac negative stool. Prostate smooth, no nodules, mildly enlarged, R lobe a little more prominent than the left.  Extremities:   No clubbing, cyanosis or edema    Pulses:   2+ and symmetric all extremities    Skin:   Skin color, texture, turgor normal, no rashes. Senile purpura on his arms.  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 Hyperkeratotic lesions on legs bilaterally. Scabs at R temple (treating with 5FU).  Onychomycotic nails (all on R, only on  great toenail on the L).  Normal monofilament sensation  Lymph nodes:   Cervical, supraclavicular, and axillary nodes normal    Neurologic:   Normal strength, sensation and gait; reflexes 2+ and symmetric throughout                   Psych:  Normal mood, affect, hygiene and grooming    Lab Results  Component Value Date   HGBA1C 6.8 (H) 01/28/2021     Chemistry      Component Value Date/Time   NA 143 01/28/2021 0842   K 5.0 01/28/2021 0842   CL 105 01/28/2021 0842   CO2 22 01/28/2021 0842   BUN 28 (H) 01/28/2021 0842   CREATININE 1.19 01/28/2021 0842   CREATININE 1.07 01/29/2017 1242      Component Value Date/Time   CALCIUM 10.2 01/28/2021 0842   ALKPHOS 98 01/28/2021 0842   AST 15 01/28/2021 0842   ALT 18 01/28/2021 0842   BILITOT 0.7 01/28/2021 0842     Fasting glu 148  Lab Results  Component Value Date   CHOL 140 01/28/2021   HDL 47 01/28/2021   LDLCALC 73 01/28/2021   TRIG 107 01/28/2021   CHOLHDL 3.0 01/28/2021   Lab Results  Component Value Date   TSH 7.920 (H) 01/28/2021   Urine microalb/Cr ratio 23  Lab Results  Component Value Date   WBC 8.3 01/28/2021   HGB 14.6 01/28/2021   HCT 42.0 01/28/2021   MCV 95 01/28/2021   PLT 266 01/28/2021    ASSESSMENT/PLAN:  Medicare  annual wellness visit, subsequent  Diabetes mellitus with microalbuminuria (HCC) - cont 1573m metformin, proper diet, lisinopril - Plan: Hemoglobin A1c, TSH, Comprehensive metabolic panel  Subclinical hypothyroidism - remains asymptomatic - Plan: TSH  Atrial fibrillation, unspecified type ([facility] - has f/u with cardiologist later this week. Anticoagulated without complication. Rate controlled.  [place] on blood thiinner.  Anticoagulant long-term use - no e/o bleeding, normal CBC - Plan: CBC with Differential/Platelet  Benign prostatic hyperplasia, unspecified whether lower urinary tract symptoms present - cont Hytrin.  Since we rx, and do GU exam, likely doesn't need to f/u with urology unless having problems.  Pure hypercholesterolemia - cont statin, at goal - Plan: simvastatin (ZOCOR) 20 MG tablet  Medication monitoring encounter - Plan: Hemoglobin A1c, CBC with Differential/Platelet, Comprehensive metabolic panel   Recommended at least 30 minutes of aerobic activity at least 5 days/week; weight bearing exercise at least 2x/week; proper sunscreen use reviewed; healthy diet and alcohol recommendations (less than or equal to 2 drinks/day) reviewed; regular seatbelt use; changing batteries in smoke detectors. Immunization recommendations discussed--UTD; continue yearly high dose flu shots. Will need TdaP from pharmacy. Colonoscopy recommendations reviewed, not rec due to age, unless problems/sx develop.   MOST form reviewed and updated.  Full Code, Full Care.   F/u med check 6 mos with labs prior A1c, TSH, CBC, c-met    Medicare Attestation I have personally reviewed: The patient's medical and social history Their use of alcohol, tobacco or illicit drugs Their current medications and supplements The patient's functional ability including ADLs,fall risks, home safety risks, cognitive, and hearing and visual impairment Diet and physical activities Evidence for depression  or mood disorders  The patient's weight, height, BMI have been recorded in the chart.  I have made referrals, counseling, and provided education to the patient based on review of the above and I have provided the patient with a written personalized care plan for  preventive services.

## 2021-01-30 ENCOUNTER — Encounter: Payer: Self-pay | Admitting: Family Medicine

## 2021-01-30 ENCOUNTER — Ambulatory Visit (INDEPENDENT_AMBULATORY_CARE_PROVIDER_SITE_OTHER): Payer: Medicare Other | Admitting: Family Medicine

## 2021-01-30 ENCOUNTER — Other Ambulatory Visit: Payer: Self-pay

## 2021-01-30 VITALS — BP 110/68 | HR 72 | Ht 70.5 in | Wt 198.8 lb

## 2021-01-30 DIAGNOSIS — E1129 Type 2 diabetes mellitus with other diabetic kidney complication: Secondary | ICD-10-CM | POA: Diagnosis not present

## 2021-01-30 DIAGNOSIS — E038 Other specified hypothyroidism: Secondary | ICD-10-CM

## 2021-01-30 DIAGNOSIS — Z0000 Encounter for general adult medical examination without abnormal findings: Secondary | ICD-10-CM

## 2021-01-30 DIAGNOSIS — E7800 Pure hypercholesterolemia, unspecified: Secondary | ICD-10-CM

## 2021-01-30 DIAGNOSIS — I4891 Unspecified atrial fibrillation: Secondary | ICD-10-CM

## 2021-01-30 DIAGNOSIS — R809 Proteinuria, unspecified: Secondary | ICD-10-CM

## 2021-01-30 DIAGNOSIS — D692 Other nonthrombocytopenic purpura: Secondary | ICD-10-CM | POA: Diagnosis not present

## 2021-01-30 DIAGNOSIS — N400 Enlarged prostate without lower urinary tract symptoms: Secondary | ICD-10-CM

## 2021-01-30 DIAGNOSIS — Z5181 Encounter for therapeutic drug level monitoring: Secondary | ICD-10-CM

## 2021-01-30 DIAGNOSIS — Z7901 Long term (current) use of anticoagulants: Secondary | ICD-10-CM

## 2021-01-30 MED ORDER — SIMVASTATIN 20 MG PO TABS: 20.0000 mg | ORAL_TABLET | Freq: Every day | ORAL | 3 refills | Status: DC

## 2021-02-01 ENCOUNTER — Encounter: Payer: Self-pay | Admitting: Cardiology

## 2021-02-01 ENCOUNTER — Other Ambulatory Visit: Payer: Self-pay

## 2021-02-01 ENCOUNTER — Ambulatory Visit (INDEPENDENT_AMBULATORY_CARE_PROVIDER_SITE_OTHER): Payer: Medicare Other | Admitting: Cardiology

## 2021-02-01 VITALS — BP 120/60 | HR 67 | Ht 70.5 in | Wt 202.4 lb

## 2021-02-01 DIAGNOSIS — I10 Essential (primary) hypertension: Secondary | ICD-10-CM

## 2021-02-01 DIAGNOSIS — E119 Type 2 diabetes mellitus without complications: Secondary | ICD-10-CM | POA: Diagnosis not present

## 2021-02-01 DIAGNOSIS — I4819 Other persistent atrial fibrillation: Secondary | ICD-10-CM | POA: Diagnosis not present

## 2021-02-11 ENCOUNTER — Telehealth: Payer: Self-pay | Admitting: *Deleted

## 2021-02-11 NOTE — Telephone Encounter (Signed)
We do not send in prescription cough medications without evaluation. Continue Mucinex DM (or the equivalent). Can set up for virtual when available

## 2021-02-11 NOTE — Telephone Encounter (Signed)
Patient advised and scheduled for Wed at 9:30 for virtual with Dr. Tomi Bamberger.

## 2021-02-11 NOTE — Telephone Encounter (Signed)
Patient called and stated he has had a cough only for 3-4 days. No other symptoms-asking for tessalon perles. I told him he would most likely need a virtual visit but we did not have any available.

## 2021-02-13 ENCOUNTER — Encounter: Payer: Self-pay | Admitting: Family Medicine

## 2021-02-13 ENCOUNTER — Telehealth: Payer: Self-pay | Admitting: Family Medicine

## 2021-02-13 ENCOUNTER — Telehealth (INDEPENDENT_AMBULATORY_CARE_PROVIDER_SITE_OTHER): Payer: Medicare Other | Admitting: Family Medicine

## 2021-02-13 ENCOUNTER — Other Ambulatory Visit: Payer: Self-pay

## 2021-02-13 VITALS — BP 121/73 | HR 79 | Temp 94.7°F | Ht 70.5 in | Wt 189.6 lb

## 2021-02-13 DIAGNOSIS — J069 Acute upper respiratory infection, unspecified: Secondary | ICD-10-CM

## 2021-02-13 DIAGNOSIS — R051 Acute cough: Secondary | ICD-10-CM | POA: Diagnosis not present

## 2021-02-13 MED ORDER — BENZONATATE 200 MG PO CAPS: 200.0000 mg | ORAL_CAPSULE | Freq: Three times a day (TID) | ORAL | 0 refills | Status: DC | PRN

## 2021-02-13 NOTE — Progress Notes (Addendum)
Start time: 9:36 End time: 9:49  Virtual Visit via Telephone Note  I connected with Kmari Halter Huster on 02/13/21 by telephone and verified that I am speaking with the correct person using two identifiers. Patient reported inability to do video visit. (Tried, unable to get it working)  Location: Patient: home Provider: office   I discussed the limitations of evaluation and management by telemedicine and the availability of in person appointments. The patient expressed understanding and agreed to proceed.  History of Present Illness:  Chief Complaint  Patient presents with   Cough    VIRTUAL cough x 1 week. No other symptoms. Cough esp at night, when it get dark at night he cannot stop. Spitting up a lot of yellow/green mucus. No home covid tests or through facility. (Maybe a couple of weeks ago).    "I feel fine, I just have a cough I can't get rid of."  Felt like he was "taking a cold" last Wednesday (12/14). He had a slight sore throat, runny nose (was clear at that time).  Cough started shortly after.  Cough is only productive first thing in the morning, is discolored.  Nasal drainage is clear, and no longer is runny. Denies sinus pain or headaches. He denies any chest pain or shortness of breath.  Cough is worse in the evening, worse when he lays down.  He had Mucinex-DM 12 hour, taking twice daily until he ran out yesterday. It helped, but still had persistent cough. He continues to take claritin once daily. He no longer feels bad, only the cough bothers him now.  Wife had slight symptoms as well, but she improved.  She had talked to the nurse, who said lots of people in the community were sick, but testing negative for COVID.  No fever or chills (was 97.6 on Sunday)  PMH, PSH, SH reviewed  Outpatient Encounter Medications as of 02/13/2021  Medication Sig Note   apixaban (ELIQUIS) 5 MG TABS tablet Take 1 tablet by mouth twice daily    benzonatate (TESSALON) 200 MG capsule  Take 1 capsule (200 mg total) by mouth 3 (three) times daily as needed for cough.    Calcium Carbonate-Vitamin D 600-400 MG-UNIT tablet Take 1 tablet by mouth 2 (two) times daily.    Coenzyme Q10 (COQ10) 100 MG CAPS Take 100 mg by mouth daily at 12 noon. 07/11/2020: Taking 200mg  daily   Glucosamine-Chondroitin (COSAMIN DS PO) Take 1 tablet by mouth 2 (two) times daily. With lunch & supper    glucose blood (ONETOUCH VERIO) test strip USE 1 STRIP TO CHECK GLUCOSE ONCE DAILY DX:E11.69    guaiFENesin-dextromethorphan (ROBITUSSIN DM) 100-10 MG/5ML syrup Take 10 mLs by mouth every 4 (four) hours as needed for cough. 02/13/2021: [employer] DM cough night and day-last dose last night   Lancets (ONETOUCH DELICA PLUS [id number]) MISC USE 1  TO CHECK GLUCOSE ONCE DAILY    lisinopril (ZESTRIL) 5 MG tablet Take 1 tablet (5 mg total) by mouth daily.    loratadine (CLARITIN) 10 MG tablet Take 10 mg by mouth daily.    metFORMIN (GLUCOPHAGE-XR) 500 MG 24 hr tablet TAKE 3 TABLETS BY MOUTH ONCE DAILY WITH BREAKFAST    Multiple Vitamins-Minerals (MULTIVITAMIN WITH MINERALS) tablet Take 1 tablet by mouth daily.    Omega-3 Fatty Acids (FISH OIL PO) Take 1,600 mg by mouth daily with supper.     simvastatin (ZOCOR) 20 MG tablet Take 1 tablet (20 mg total) by mouth at bedtime.  terazosin (HYTRIN) 5 MG capsule Take 1 capsule (5 mg total) by mouth at bedtime.    acetaminophen (TYLENOL) 325 MG tablet Take 325 mg by mouth every 6 (six) hours as needed for mild pain, moderate pain or headache. (Patient not taking: Reported on 02/13/2021)    fluorouracil (EFUDEX) 5 % cream APPLY TO AFFECTED AREAS AT BEDTIME FOR 3 WEEKS. (Patient not taking: Reported on 02/13/2021) 02/13/2021: Not using right now   No facility-administered encounter medications on file as of 02/13/2021.   NOT taking tessalon prior to today's visit  Allergies  Allergen Reactions   Penicillins Rash    Has patient had a PCN reaction causing immediate rash,  facial/tongue/throat swelling, SOB or lightheadedness with hypotension: No Has patient had a PCN reaction causing severe rash involving mucus membranes or skin necrosis: No Has patient had a PCN reaction that required hospitalization: No Has patient had a PCN reaction occurring within the last 10 years: No If all of the above answers are "NO", then may proceed with Cephalosporin use.     ROS:  No n/v/d.  No f/c.  No CP or SOB.  No HA, dizziness. URI symptoms per HPI.   Observations/Objective:  BP 121/73    Pulse 79    Temp (!) 94.7 F (34.8 C) (Oral)    Ht 5' 10.5" (1.791 m)    Wt 189 lb 9.6 oz (86 kg)    BMI 26.82 kg/m    Assessment and Plan:  Viral upper respiratory tract infection - supportive measures reviewed, and s/sx bacterial infection/complications reviewed  Acute cough - Plan: benzonatate (TESSALON) 200 MG capsule  Drink plenty of fluids. Continue the mucinex DM if needed for any thick secretions or cough. Take the benzonatate (tessalon) three times daily, if needed for cough. You may continue your Claritin daily.  If you notice any discolored nasal drainage, or you are coughing up more phlegm throughout the day that remains discolored, please contact us--I would suggest an antibiotic at that point, if that occurs. Let us know if you develop any fever, vomiting, shortness of breath, pain with breathing or chest pain.  ADDENDUM: +COVID test today. Spoke with patient and advised of recommendations. Will send revised AWV.  Day 0 12/14 Days 1-5: 12/15-19 Days 6-10: 12/20-24  Follow Up Instructions:    I discussed the assessment and treatment plan with the patient. The patient was provided an opportunity to ask questions and all were answered. The patient agreed with the plan and demonstrated an understanding of the instructions.   The patient was advised to call back or seek an in-person evaluation if the symptoms worsen or if the condition fails to improve as  anticipated.  I spent 15 minutes dedicated to the care of this patient, including pre-visit review of records, face to face time, post-visit ordering of testing and documentation.  Addendum--additional 3-4 mins of phone call after +COVID and updating AVS to be re-sent.   Vikki Ports, MD

## 2021-02-13 NOTE — Telephone Encounter (Signed)
Pt's wife called and states that pt's was positive for COVID. Pt can be reached at [phone]

## 2021-02-13 NOTE — Patient Instructions (Addendum)
Drink plenty of fluids. Continue the mucinex DM if needed for any thick secretions or cough. Take the benzonatate (tessalon) three times daily, if needed for cough. You may continue your Claritin daily.  If you notice any discolored nasal drainage, or you are coughing up more phlegm throughout the day that remains discolored, please contact us--I would suggest an antibiotic at that point, if that occurs. Let us know if you develop any fever, vomiting, shortness of breath, pain with breathing or chest pain.  You tested positive for COVID today.  It is more than 5 days since the start of your symptoms, so we missed any opportunity to use medications to treat the COVID.  Hopefully your symptoms will stay mild (thanks for getting all of your vaccines!)  Typically for COVID these are the recommendations: Isolate for days 1-5 (which would be 12/15-19; 12/14 was your first day of symptoms, which counts as "day 0") If you aren't having fever, and respiratory symptoms (cough) is improving, then for days 6-10 (12/20-24) you are allowed to leave your home, but wear your mask at all times (don't eat with others). Christmas day will be day 11, at which time you should no longer be contagious (assuming that you are feeling better).  I hope you feel better soon!

## 2021-03-25 ENCOUNTER — Other Ambulatory Visit: Payer: Self-pay | Admitting: Cardiology

## 2021-03-25 NOTE — Telephone Encounter (Signed)
Prescription refill request for Eliquis received. Indication:Afib Last office visit:12/22 Scr:1.1 Age: 88 Weight:86 kg  Prescription refilled

## 2021-04-08 ENCOUNTER — Other Ambulatory Visit: Payer: Self-pay | Admitting: Family Medicine

## 2021-04-08 DIAGNOSIS — N400 Enlarged prostate without lower urinary tract symptoms: Secondary | ICD-10-CM

## 2021-04-15 ENCOUNTER — Other Ambulatory Visit: Payer: Self-pay | Admitting: Family Medicine

## 2021-04-15 DIAGNOSIS — E1129 Type 2 diabetes mellitus with other diabetic kidney complication: Secondary | ICD-10-CM

## 2021-04-15 DIAGNOSIS — R809 Proteinuria, unspecified: Secondary | ICD-10-CM

## 2021-06-15 ENCOUNTER — Other Ambulatory Visit: Payer: Self-pay | Admitting: Family Medicine

## 2021-06-15 DIAGNOSIS — E1129 Type 2 diabetes mellitus with other diabetic kidney complication: Secondary | ICD-10-CM

## 2021-07-10 ENCOUNTER — Other Ambulatory Visit: Payer: Self-pay | Admitting: Family Medicine

## 2021-07-10 DIAGNOSIS — N400 Enlarged prostate without lower urinary tract symptoms: Secondary | ICD-10-CM

## 2021-07-31 ENCOUNTER — Other Ambulatory Visit: Payer: Medicare Other

## 2021-07-31 DIAGNOSIS — Z7901 Long term (current) use of anticoagulants: Secondary | ICD-10-CM

## 2021-07-31 DIAGNOSIS — Z5181 Encounter for therapeutic drug level monitoring: Secondary | ICD-10-CM

## 2021-07-31 DIAGNOSIS — E038 Other specified hypothyroidism: Secondary | ICD-10-CM

## 2021-07-31 DIAGNOSIS — E1129 Type 2 diabetes mellitus with other diabetic kidney complication: Secondary | ICD-10-CM

## 2021-08-01 LAB — COMPREHENSIVE METABOLIC PANEL
ALT: 17 IU/L (ref 0–44)
AST: 17 IU/L (ref 0–40)
Albumin/Globulin Ratio: 1.9 (ref 1.2–2.2)
Albumin: 4.3 g/dL (ref 3.6–4.6)
Alkaline Phosphatase: 91 IU/L (ref 44–121)
BUN/Creatinine Ratio: 22 (ref 10–24)
BUN: 24 mg/dL (ref 8–27)
Bilirubin Total: 0.8 mg/dL (ref 0.0–1.2)
CO2: 20 mmol/L (ref 20–29)
Calcium: 9.3 mg/dL (ref 8.6–10.2)
Chloride: 103 mmol/L (ref 96–106)
Creatinine, Ser: 1.07 mg/dL (ref 0.76–1.27)
Globulin, Total: 2.3 g/dL (ref 1.5–4.5)
Glucose: 134 mg/dL — ABNORMAL HIGH (ref 70–99)
Potassium: 4.7 mmol/L (ref 3.5–5.2)
Sodium: 137 mmol/L (ref 134–144)
Total Protein: 6.6 g/dL (ref 6.0–8.5)
eGFR: 67 mL/min/{1.73_m2} (ref >59)

## 2021-08-01 LAB — CBC WITH DIFFERENTIAL/PLATELET
Basophils Absolute: 0 10*3/uL (ref 0.0–0.2)
Basos: 1 %
EOS (ABSOLUTE): 0.3 10*3/uL (ref 0.0–0.4)
Eos: 4 %
Hematocrit: 38.6 % (ref 37.5–51.0)
Hemoglobin: 13.5 g/dL (ref 13.0–17.7)
Immature Grans (Abs): 0 10*3/uL (ref 0.0–0.1)
Immature Granulocytes: 0 %
Lymphocytes Absolute: 2.3 10*3/uL (ref 0.7–3.1)
Lymphs: 32 %
MCH: 33.1 pg — ABNORMAL HIGH (ref 26.6–33.0)
MCHC: 35 g/dL (ref 31.5–35.7)
MCV: 95 fL (ref 79–97)
Monocytes Absolute: 0.5 10*3/uL (ref 0.1–0.9)
Monocytes: 7 %
Neutrophils Absolute: 4.1 10*3/uL (ref 1.4–7.0)
Neutrophils: 56 %
Platelets: 248 10*3/uL (ref 150–450)
RBC: 4.08 x10E6/uL — ABNORMAL LOW (ref 4.14–5.80)
RDW: 11.9 % (ref 11.6–15.4)
WBC: 7.3 10*3/uL (ref 3.4–10.8)

## 2021-08-01 LAB — HEMOGLOBIN A1C
Est. average glucose Bld gHb Est-mCnc: 148 mg/dL
Hgb A1c MFr Bld: 6.8 % — ABNORMAL HIGH (ref 4.8–5.6)

## 2021-08-01 LAB — TSH: TSH: 7.33 u[IU]/mL — ABNORMAL HIGH (ref 0.450–4.500)

## 2021-08-04 NOTE — Progress Notes (Unsigned)
Patient presents for 6 month follow-up on chronic problems. See below for labs done prior to visit.   Diabetes: Metformin dose was increased last year (from 1000 to 1545m) after A1c was up to 7.0%.  Follow-up A1c was 6.8%.  He tolerates metformin without any side effects. Sugars are running (Only checks in the  mornings).  He denies any sores or lesions on his feet. No further problems with tingling. He reports very little sweets (1 dessert a week). He tries to limit serving sizes of bread, pasta, rice.  Denies hypoglycemia, polydipsia and polyuria.  Last eye exam was in 09/2020, no retinopathy.  He has h/o microalbuminuria, and continues on lisinopril without side effects. Denies dizziness or cough.   Hyperlipidemia follow-up: Patient is reportedly following a low-fat, low cholesterol diet. Compliant with medications (simvastatin) and denies medication side effects. Lipids were at goal on last check. Lab Results  Component Value Date   CHOL 140 01/28/2021   HDL 47 01/28/2021   LDLCALC 73 01/28/2021   TRIG 107 01/28/2021   CHOLHDL 3.0 01/28/2021    Atrial fibrillation -- Noted after cholecystectomy in 2020, has persisted and is asymptomatic.  He last saw Dr. JMartiniquein 01/2021. He continues on anticoagulants. He has not required any rate-controlling medications.  He has some easy bruising, but denies any bleeding. He denies chest pain, palpitations, shortness of breath.   BPH:  Under the care of [facility] (since getting admitted 01/2017, had UTI, AKI), Dr. WGilford Rile He is doing well on Hytrin. In 10/2020 he tried oxybutynin, but didn't tolerate it (mouth dryness, made him feel nervous).  He has been getting up twice per night. He feels like his stream is good, empties bladder well. UPDATE   Subclinical hypothyroidism: He has had some borderline and elevated TSH values in the past, never symptomatic. TSH was normal in 12/2018, in the 6's prior to that, and went up to 7.690 in  12/2019, down to 4.75 in 07/2020, back up to 7.92 in 01/2021. He denies fatigue, cold intolerance, hair/skin/nail changes.     PMH, PSH, SH reviewed   ROS: No fever, chills, URI symptoms, headaches, dizziness, GI complaints.  No chest pain, palpitations, dyspnea on exertion.  Denies edema. Denies dysuria, hematuria. Denies numbness, tingling, weakness, tremor, rashes or abnormal bleeding (easily bruises if he bumps his arms).   Moods are good. Rare heartburn, about once a month after rich foods, uses Tums prn   PHYSICAL EXAM:  There were no vitals taken for this visit.  Wt Readings from Last 3 Encounters:  02/13/21 189 lb 9.6 oz (86 kg)  02/01/21 202 lb 6.4 oz (91.8 kg)  01/30/21 198 lb 12.8 oz (90.2 kg)   Well developed, pleasant elderly male in no distress   HEENT: PERRL EOMI, conjunctiva and sclera are clear. Wearing hearing aids. Neck: no lymphadenopathy, thyromegaly or carotid bruit   Heart:  Somewhat irregularly irregular, slow (never rapid). No murmur Lungs: clear bilaterally   Abdomen: soft, nontender, no mass.  Back: no CVA tenderness or spinal tenderness. Significant curvature of the lumbar spine, unchanged (reverse C curve, prominent spine on the R) Extremities: no edema, 2+ pulse   Skin: actinic changes and some purpura L>R. Psych: normal mood, affect, hygiene and grooming   Neuro: alert and oriented, normal gait.   UPDATE SKIN DIABETIC FOOT EXAM  Lab Results  Component Value Date   HGBA1C 6.8 (H) 07/31/2021   Fasting glucose 134    Chemistry  Component Value Date/Time   NA 137 07/31/2021 0916   K 4.7 07/31/2021 0916   CL 103 07/31/2021 0916   CO2 20 07/31/2021 0916   BUN 24 07/31/2021 0916   CREATININE 1.07 07/31/2021 0916   CREATININE 1.07 01/29/2017 1242      Component Value Date/Time   CALCIUM 9.3 07/31/2021 0916   ALKPHOS 91 07/31/2021 0916   AST 17 07/31/2021 0916   ALT 17 07/31/2021 0916   BILITOT 0.8 07/31/2021 0916     Lab Results   Component Value Date   WBC 7.3 07/31/2021   HGB 13.5 07/31/2021   HCT 38.6 07/31/2021   MCV 95 07/31/2021   PLT 248 07/31/2021   Lab Results  Component Value Date   TSH 7.330 (H) 07/31/2021    ASSESSMENT/PLAN:   ***DO DIABETIC FOOT EXAM   RF lisinopril Metformin will be due 7/24, do now??  F/u in December for wellness visit, as scheduled. ENTER FUTURE LABS A1c, c-met ,CBC, TSH and TPO Ab, lipids, urine microalb

## 2021-08-05 ENCOUNTER — Encounter: Payer: Self-pay | Admitting: Family Medicine

## 2021-08-05 ENCOUNTER — Ambulatory Visit (INDEPENDENT_AMBULATORY_CARE_PROVIDER_SITE_OTHER): Payer: Medicare Other | Admitting: Family Medicine

## 2021-08-05 VITALS — BP 130/70 | HR 77 | Wt 198.0 lb

## 2021-08-05 DIAGNOSIS — E1129 Type 2 diabetes mellitus with other diabetic kidney complication: Secondary | ICD-10-CM

## 2021-08-05 DIAGNOSIS — E038 Other specified hypothyroidism: Secondary | ICD-10-CM | POA: Diagnosis not present

## 2021-08-05 DIAGNOSIS — D692 Other nonthrombocytopenic purpura: Secondary | ICD-10-CM | POA: Diagnosis not present

## 2021-08-05 DIAGNOSIS — R809 Proteinuria, unspecified: Secondary | ICD-10-CM

## 2021-08-05 DIAGNOSIS — N400 Enlarged prostate without lower urinary tract symptoms: Secondary | ICD-10-CM

## 2021-08-05 DIAGNOSIS — Z7901 Long term (current) use of anticoagulants: Secondary | ICD-10-CM

## 2021-08-05 DIAGNOSIS — I482 Chronic atrial fibrillation, unspecified: Secondary | ICD-10-CM | POA: Diagnosis not present

## 2021-08-05 DIAGNOSIS — E7800 Pure hypercholesterolemia, unspecified: Secondary | ICD-10-CM

## 2021-08-05 MED ORDER — METFORMIN HCL ER 500 MG PO TB24: ORAL_TABLET | ORAL | 1 refills | Status: DC

## 2021-08-05 MED ORDER — LISINOPRIL 5 MG PO TABS: 5.0000 mg | ORAL_TABLET | Freq: Every day | ORAL | 3 refills | Status: DC

## 2021-08-05 NOTE — Patient Instructions (Addendum)
Continue your current medications. If the pain in your legs doesn't improve with time, or if you develop weakness or other concerns, please return for re-evaluation. Keep up the regular exercise, and work on some weight loss, as you planned to.  I recommend getting the updated bivalent COVID booster once they are available in the Fall. You can get these the same day as your flu shot if they are both available.

## 2021-09-25 ENCOUNTER — Other Ambulatory Visit: Payer: Self-pay | Admitting: Cardiology

## 2021-09-25 DIAGNOSIS — I4819 Other persistent atrial fibrillation: Secondary | ICD-10-CM

## 2021-09-26 NOTE — Telephone Encounter (Signed)
Prescription refill request for Eliquis received. Indication: Afib  Last office visit: 02/01/21 ([place])  Scr: 1.07 (07/31/21)  Age: 88 Weight: 89.8kg  Appropriate dose and refill sent to requested pharmacy.

## 2021-10-08 LAB — HM DIABETES EYE EXAM

## 2021-10-09 ENCOUNTER — Encounter: Payer: Self-pay | Admitting: *Deleted

## 2021-10-09 ENCOUNTER — Other Ambulatory Visit: Payer: Self-pay | Admitting: Family Medicine

## 2021-10-09 DIAGNOSIS — N400 Enlarged prostate without lower urinary tract symptoms: Secondary | ICD-10-CM

## 2021-10-10 ENCOUNTER — Encounter: Payer: Self-pay | Admitting: Family Medicine

## 2021-11-27 ENCOUNTER — Telehealth: Payer: Self-pay | Admitting: Licensed Clinical Social Worker

## 2021-11-27 NOTE — Patient Outreach (Signed)
  Care Coordination   11/27/2021 Name: Andre Walker MRN: [id number] DOB: Jan 17, 1933   Care Coordination Outreach Attempts:  An unsuccessful telephone outreach was attempted today to offer the patient information about available care coordination services as a benefit of their health plan.   Follow Up Plan:  Additional outreach attempts will be made to offer the patient care coordination information and services.   Encounter Outcome:  No Answer  Care Coordination Interventions Activated:  No   Care Coordination Interventions:  No, not indicated    Christa See, MSW, [email or web address] Phone [phone] 5:26 PM

## 2021-12-05 ENCOUNTER — Ambulatory Visit: Payer: Self-pay

## 2021-12-05 NOTE — Patient Outreach (Signed)
  Care Coordination   Initial Visit Note   12/05/2021 Name: Andre Walker MRN: [id number] DOB: Jul 24, 1932  Andre Walker is a 88 y.o. year old male who sees Rita Ohara, MD for primary care. I spoke with  Andre Walker by phone today.  What matters to the patients health and wellness today? Doing well, no concerns at this time    Goals Addressed             This Visit's Progress    COMPLETED: Care Coordination Activities       Care Coordination Interventions: SDoH screening performed - no acute resource challenges identified at this time Determined the patient does not have concerns with medication costs and/or adherence Performed chart review to note the patient has an upcoming annual wellness visit scheduled on 02/03/22 Education provided on the role of the care coordination program - no follow up desired at this time Instructed the patient to contact his primary care provider as needed         SDOH assessments and interventions completed:  Yes  SDOH Interventions Today    Flowsheet Row Most Recent Value  SDOH Interventions   Food Insecurity Interventions Intervention Not Indicated  Housing Interventions Intervention Not Indicated  Transportation Interventions Intervention Not Indicated  Utilities Interventions Intervention Not Indicated        Care Coordination Interventions Activated:  Yes  Care Coordination Interventions:  Yes, provided   Follow up plan: No further intervention required.   Encounter Outcome:  Pt. Visit Completed   Daneen Schick, BSW, CDP Social Worker, Certified Dementia Practitioner [place] [facility]  Care Coordination [phone]

## 2021-12-05 NOTE — Patient Instructions (Signed)
Visit Information  Thank you for taking time to visit with me today. Please don't hesitate to contact me if I can be of assistance to you.   Following are the goals we discussed today:   Goals Addressed             This Visit's Progress    COMPLETED: Care Coordination Activities       Care Coordination Interventions: SDoH screening performed - no acute resource challenges identified at this time Determined the patient does not have concerns with medication costs and/or adherence Performed chart review to note the patient has an upcoming annual wellness visit scheduled on 02/03/22 Education provided on the role of the care coordination program - no follow up desired at this time Instructed the patient to contact his primary care provider as needed        If you are experiencing a Mental Health or [place] or need someone to talk to, please call 1-800-273-TALK (toll free, 24 hour hotline)  The patient verbalized understanding of instructions, educational materials, and care plan provided today and DECLINED offer to receive copy of patient instructions, educational materials, and care plan.   No further follow up required: Please contact your primary care provider as needed.  Daneen Schick, BSW, CDP Social Worker, Certified Dementia Practitioner [facility]  Care Coordination [phone]

## 2022-01-08 ENCOUNTER — Other Ambulatory Visit: Payer: Self-pay | Admitting: Family Medicine

## 2022-01-08 DIAGNOSIS — N400 Enlarged prostate without lower urinary tract symptoms: Secondary | ICD-10-CM

## 2022-01-15 ENCOUNTER — Other Ambulatory Visit: Payer: Self-pay | Admitting: Cardiology

## 2022-01-15 DIAGNOSIS — I4819 Other persistent atrial fibrillation: Secondary | ICD-10-CM

## 2022-01-26 ENCOUNTER — Other Ambulatory Visit: Payer: Self-pay | Admitting: Family Medicine

## 2022-01-26 DIAGNOSIS — E7800 Pure hypercholesterolemia, unspecified: Secondary | ICD-10-CM

## 2022-01-29 ENCOUNTER — Other Ambulatory Visit (INDEPENDENT_AMBULATORY_CARE_PROVIDER_SITE_OTHER): Payer: Medicare Other

## 2022-01-29 DIAGNOSIS — D692 Other nonthrombocytopenic purpura: Secondary | ICD-10-CM

## 2022-01-29 DIAGNOSIS — Z23 Encounter for immunization: Secondary | ICD-10-CM | POA: Diagnosis not present

## 2022-01-29 DIAGNOSIS — E038 Other specified hypothyroidism: Secondary | ICD-10-CM

## 2022-01-29 DIAGNOSIS — E1129 Type 2 diabetes mellitus with other diabetic kidney complication: Secondary | ICD-10-CM

## 2022-01-29 DIAGNOSIS — Z7901 Long term (current) use of anticoagulants: Secondary | ICD-10-CM

## 2022-01-29 DIAGNOSIS — E7800 Pure hypercholesterolemia, unspecified: Secondary | ICD-10-CM

## 2022-01-30 LAB — CBC WITH DIFFERENTIAL/PLATELET
Basophils Absolute: 0 10*3/uL (ref 0.0–0.2)
Basos: 0 %
EOS (ABSOLUTE): 0.3 10*3/uL (ref 0.0–0.4)
Eos: 4 %
Hematocrit: 41.2 % (ref 37.5–51.0)
Hemoglobin: 13.6 g/dL (ref 13.0–17.7)
Immature Grans (Abs): 0 10*3/uL (ref 0.0–0.1)
Immature Granulocytes: 0 %
Lymphocytes Absolute: 2.3 10*3/uL (ref 0.7–3.1)
Lymphs: 30 %
MCH: 31.6 pg (ref 26.6–33.0)
MCHC: 33 g/dL (ref 31.5–35.7)
MCV: 96 fL (ref 79–97)
Monocytes Absolute: 0.6 10*3/uL (ref 0.1–0.9)
Monocytes: 8 %
Neutrophils Absolute: 4.5 10*3/uL (ref 1.4–7.0)
Neutrophils: 58 %
Platelets: 286 10*3/uL (ref 150–450)
RBC: 4.3 x10E6/uL (ref 4.14–5.80)
RDW: 12.3 % (ref 11.6–15.4)
WBC: 7.8 10*3/uL (ref 3.4–10.8)

## 2022-01-30 LAB — MICROALBUMIN / CREATININE URINE RATIO
Creatinine, Urine: 53.4 mg/dL
Microalb/Creat Ratio: 17 mg/g creat (ref 0–29)
Microalbumin, Urine: 9.2 ug/mL

## 2022-01-30 LAB — COMPREHENSIVE METABOLIC PANEL
ALT: 21 IU/L (ref 0–44)
AST: 19 IU/L (ref 0–40)
Albumin/Globulin Ratio: 2 (ref 1.2–2.2)
Albumin: 4.4 g/dL (ref 3.7–4.7)
Alkaline Phosphatase: 96 IU/L (ref 44–121)
BUN/Creatinine Ratio: 22 (ref 10–24)
BUN: 24 mg/dL (ref 8–27)
Bilirubin Total: 0.7 mg/dL (ref 0.0–1.2)
CO2: 24 mmol/L (ref 20–29)
Calcium: 9.4 mg/dL (ref 8.6–10.2)
Chloride: 102 mmol/L (ref 96–106)
Creatinine, Ser: 1.11 mg/dL (ref 0.76–1.27)
Globulin, Total: 2.2 g/dL (ref 1.5–4.5)
Glucose: 130 mg/dL — ABNORMAL HIGH (ref 70–99)
Potassium: 4.7 mmol/L (ref 3.5–5.2)
Sodium: 138 mmol/L (ref 134–144)
Total Protein: 6.6 g/dL (ref 6.0–8.5)
eGFR: 64 mL/min/{1.73_m2} (ref >59)

## 2022-01-30 LAB — THYROID PEROXIDASE ANTIBODY: Thyroperoxidase Ab SerPl-aCnc: <9 IU/mL (ref 0–34)

## 2022-01-30 LAB — LIPID PANEL
Chol/HDL Ratio: 2.6 ratio (ref 0.0–5.0)
Cholesterol, Total: 103 mg/dL (ref 100–199)
HDL: 40 mg/dL (ref >39)
LDL Chol Calc (NIH): 45 mg/dL (ref 0–99)
Triglycerides: 90 mg/dL (ref 0–149)
VLDL Cholesterol Cal: 18 mg/dL (ref 5–40)

## 2022-01-30 LAB — HEMOGLOBIN A1C
Est. average glucose Bld gHb Est-mCnc: 143 mg/dL
Hgb A1c MFr Bld: 6.6 % — ABNORMAL HIGH (ref 4.8–5.6)

## 2022-01-30 LAB — TSH: TSH: 8.65 u[IU]/mL — ABNORMAL HIGH (ref 0.450–4.500)

## 2022-02-02 NOTE — Progress Notes (Unsigned)
No chief complaint on file.  Andre Walker is a 88 y.o. male who presents for annual wellness visit and follow-up on chronic medical conditions.  He had labs done prior to his visit (see below)    Diabetes: He reports compliance with Metformin 1573m daily, and denies any side effects. Last A1c was 6.8% in June 2023 on this regimen. Sugars are running  (Only checks in the  mornings).   He denies any sores or lesions on his feet, denies tingling. He reports very little sweets (1 dessert a week). He tries to limit serving sizes of bread, pasta, rice.  Denies hypoglycemia, polydipsia and polyuria.  Last eye exam was in 09/2021, no retinopathy.  He has h/o microalbuminuria, and continues on lisinopril without side effects. Denies dizziness or cough.   Hyperlipidemia follow-up: Patient is reportedly following a low-fat, low cholesterol diet. Compliant with medications (simvastatin) and denies medication side effects.   Atrial fibrillation -- Noted after cholecystectomy in 2020, has persisted and is asymptomatic.  He last saw Dr. JMartiniquein 01/2021, scheduled for 02/10/22. He continues on anticoagulants. He has not required any rate-controlling medications.  He has some easy bruising, but denies any bleeding. He denies chest pain, palpitations, shortness of breath.   BPH:  Under the care of [facility].  He continues on Hytrin. At his last visit 10/2021 he was given a trial of trospium nightly for OAB, as he had been getting up 3-4 times/night. UPDATE He had a UTI in March, none since. He feels like his stream is good, empties bladder well. Denies dysuria, hematuria. Getting up HOW MANY X/NIGHT??   Subclinical hypothyroidism: He has had some borderline and elevated TSH values in the past, never symptomatic. He denies fatigue, cold intolerance, hair/skin/nail changes.   In June he had noted some weight gain, which he attributed to not being as good as he should be with his diet (family  reunions, etc).  Component Ref Range & Units 4 d ago (01/29/22) 6 mo ago (07/31/21) 1 yr ago (01/28/21) 1 yr ago (08/13/20) 1 yr ago (07/09/20) 2 yr ago (01/11/20) 3 yr ago (01/24/19)  TSH 0.450 - 4.500 uIU/mL 8.650 High  7.330 High  7.920 High  4.750 High  6.500 High  7.690 High  2.541 R, CM     Immunization History  Administered Date(s) Administered   COVID-19, mRNA, vaccine(Comirnaty)12 years and older 01/29/2022   Fluad Quad(high Dose 65+) 12/22/2018, 11/17/2019, 12/11/2020, 11/24/2021   Influenza Split 11/25/2007, 10/26/2008, 12/02/2010, 12/03/2011   Influenza, High Dose Seasonal PF 12/14/2009, 12/01/2012, 11/24/2013, 12/12/2013, 12/06/2014, 12/15/2014, 11/13/2015, 11/26/2016, 12/07/2017   Moderna Covid-19 Vaccine Bivalent Booster 171yr& up 12/11/2020   Moderna SARS-COV2 Booster Vaccination 07/09/2020   Moderna Sars-Covid-2 Vaccination 02/28/2019, 03/28/2019, 01/03/2020   Pneumococcal Conjugate-13 02/24/2013, 09/12/2013   Pneumococcal Polysaccharide-23 12/25/2004, 06/04/2011, 06/07/2011, 12/03/2016   Td 04/24/2004   Tdap 12/02/2010, 01/30/2021   Zoster Recombinat (Shingrix) 06/30/2017, 10/11/2017   Zoster, Live 10/25/2007   Last colonoscopy: 12/2015, showing int/external hemorrhoids, hyperplastic polyp and diverticulosis Last PSA: no longer being screened; under care of urologist.  Dentist: twice yearly Ophtho: yearly Exercise:  He is walking outside daily (weather-permitting), [id number]. Goes to the exercise room 3 days/week, weights, bike (with arms, 30 minutes). Golfs once a week.  Patient Care Team: KnRita OharaMD as PCP - General (Family Medicine) Dr. TaMarshia Lys Consulting Physician (Dentistry) Dr. JoMartiniquecardiology) Dr. WiLovena Neighboursurologist)  Dr. GrPearline Cablesdermatologist) --sees every 6 months Dr. GrKaty Fitchophtho) --yearly  Dr. Watt Climes (GI)  [facility], didn't [facility] for a year, back to qualifying, but hasn't been back  Got hearing aids fixed at [facility]  hearing. Dr. Hassell Done (general surgeon, did melanoma excision, no regular f/u)   Depression Screening: [facility] Office Visit from 01/30/2021 in [facility]  PHQ-2 Total Score 0        Falls screen:     01/30/2021    1:38 PM 01/16/2020    9:26 AM 12/27/2018    1:53 PM 12/09/2017    1:59 PM 12/03/2016    9:33 AM  [place] in the past year? 0 0 0 No No  Number falls in past yr: 0 0     Injury with Fall? 0 0     Risk for fall due to : No Fall Risks      Follow up Falls evaluation completed         Functional Status Survey:        End of Life Discussion:  Patient has a living will and medical power of attorney   PMH, PSH, SH and FH were reviewed and updated    ROS: The patient denies anorexia, fever, headaches, vision loss, ear pain, hoarseness, chest pain, palpitations, dizziness, syncope, dyspnea on exertion, cough, nausea, vomiting, diarrhea, constipation, abdominal pain, melena, hematochezia, indigestion/heartburn, hematuria, incontinence, nocturia (just once or twice a night), weakened urine stream, dysuria, genital lesions, numbness, tingling, weakness, tremor, rashes, depression, anxiety, abnormal bleeding (easily bruises if he bumps his arms), or enlarged lymph nodes.   Rare heartburn, about once a month (spicy, spaghetti), uses Tums prn, rarely. 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 Up 2x/night to void.  Rare slight leakage.  Intermittently feels like his balance is a little off, can't walk as fast as he used to; he had PT for balance in the past, which helped a lot. Denies falls. UPDATE   PHYSICAL EXAM:  There were no vitals taken for this visit.  Wt Readings from Last 3 Encounters:  08/05/21 198 lb (89.8 kg)  02/13/21 189 lb 9.6 oz (86 kg)  02/01/21 202 lb 6.4 oz (91.8 kg)    General Appearance:   Alert, cooperative, no distress, appears stated age    Head:   Normocephalic, without obvious  abnormality, atraumatic    Eyes:   PERRL, conjunctiva/corneas clear, EOM's intact, fundi normal  Ears:   ***  Nose:   No drainage or sinus tenderness  Throat:   Normal mucosa  Neck:   Supple, no lymphadenopathy; thyroid: no enlargement/ tenderness/nodules; no carotid bruit or JVD    Back:   Spine nontender, no curvature, ROM normal, no CVA tenderness. Scoliosis and prominence/kyphosis of the lower lumbar spine. nontender.  Lungs:   Clear to auscultation bilaterally without wheezes, rales or ronchi; respirations unlabored    Chest Wall:   No tenderness or deformity    Heart:   Irregularly irregular, S1 and S2 normal, no murmur, rub  or gallop.   Breast Exam:   No chest wall tenderness, masses or gynecomastia    Abdomen:   Soft, non-tender, nondistended, normoactive bowel sounds, no masses, no hepatosplenomegaly. Small, soft, easily reducible umbilical hernia  Genitalia:   Normal male external genitalia without lesions. Testicles without masses. No inguinal hernias.    Rectal:   Normal sphincter tone, no masses or tenderness; very soft, guaiac negative stool. Prostate smooth, no nodules, mildly enlarged, R lobe a little more prominent  than the left.  Extremities:   No clubbing, cyanosis or edema    Pulses:   2+ and symmetric all extremities    Skin:   Skin color, texture, turgor normal, no rashes. Senile purpura on his arms.  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 Hyperkeratotic lesions on legs bilaterally. Onychomycotic nails (all on R, only on great toenail on the L).   Lymph nodes:   Cervical, supraclavicular, and axillary nodes normal    Neurologic:   Normal strength, sensation and gait; reflexes 2+ and symmetric throughout                   Psych:  Normal mood, affect, hygiene and grooming   *** UPDATE Afib, small umbilical hernia, R lobe prostate more prominent than L? Snile purpura on arms, AK's forearms hands, HK legs, onychomycotic nails (all on R, only L great toenail)  Update if TM's examined  or if wearing hearing aids   Lab Results  Component Value Date   HGBA1C 6.6 (H) 01/29/2022     Chemistry      Component Value Date/Time   NA 138 01/29/2022 0834   K 4.7 01/29/2022 0834   CL 102 01/29/2022 0834   CO2 24 01/29/2022 0834   BUN 24 01/29/2022 0834   CREATININE 1.11 01/29/2022 0834   CREATININE 1.07 01/29/2017 1242      Component Value Date/Time   CALCIUM 9.4 01/29/2022 0834   ALKPHOS 96 01/29/2022 0834   AST 19 01/29/2022 0834   ALT 21 01/29/2022 0834   BILITOT 0.7 01/29/2022 0834     Fasting glu 130  Lab Results  Component Value Date   CHOL 103 01/29/2022   HDL 40 01/29/2022   LDLCALC 45 01/29/2022   TRIG 90 01/29/2022   CHOLHDL 2.6 01/29/2022   Lab Results  Component Value Date   TSH 8.650 (H) 01/29/2022   Component Ref Range & Units 01/29/22  Thyroperoxidase Ab SerPl-aCnc 0 - 34 IU/mL <9   Urine microalb/Cr ratio 17  Lab Results  Component Value Date   WBC 7.8 01/29/2022   HGB 13.6 01/29/2022   HCT 41.2 01/29/2022   MCV 96 01/29/2022   PLT 286 01/29/2022    ASSESSMENT/PLAN:  Verify that urology doesn't do GU exams (looks like I do them yearly, though he seems them multiple times/year, I don't see any prostate checks done??)  RSV from pharmacy (to wait since just got COVID 12/6)  Recommended at least 30 minutes of aerobic activity at least 5 days/week; weight bearing exercise at least 2x/week; proper sunscreen use reviewed; healthy diet and alcohol recommendations (less than or equal to 2 drinks/day) reviewed; regular seatbelt use; changing batteries in smoke detectors. Immunization recommendations discussed--UTD; continue yearly high dose flu shots. RSV vaccine is recommended, to get from pharmacy. Colonoscopy recommendations reviewed, not rec due to age, unless problems/sx develop.   MOST form reviewed and updated.  Full Code, Full Care.   F/u med check 6 mos with labs prior A1c, TSH, CBC, c-met VERIFY IF THESE ARE CORRECT AND ENTER  FUTURE ORDERS (***)    Medicare Attestation I have personally reviewed: The patient's medical and social history Their use of alcohol, tobacco or illicit drugs Their current medications and supplements The patient's functional ability including ADLs,fall risks, home safety risks, cognitive, and hearing and visual impairment Diet and physical activities Evidence for depression or mood disorders  The patient's weight, height, BMI have been recorded in the chart.  I have made referrals, counseling,  and provided education to the patient based on review of the above and I have provided the patient with a written personalized care plan for preventive services.

## 2022-02-02 NOTE — Patient Instructions (Signed)
  HEALTH MAINTENANCE RECOMMENDATIONS:  It is recommended that you get at least 30 minutes of aerobic exercise at least 5 days/week (for weight loss, you may need as much as 60-90 minutes). This can be any activity that gets your heart rate up. This can be divided in 10-15 minute intervals if needed, but try and build up your endurance at least once a week.  Weight bearing exercise is also recommended twice weekly.  Eat a healthy diet with lots of vegetables, fruits and fiber.  "Colorful" foods have a lot of vitamins (ie green vegetables, tomatoes, red peppers, etc).  Limit sweet tea, regular sodas and alcoholic beverages, all of which has a lot of calories and sugar.  Up to 2 alcoholic drinks daily may be beneficial for men (unless trying to lose weight, watch sugars).  Drink a lot of water.  Sunscreen of at least SPF 30 should be used on all sun-exposed parts of the skin when outside between the hours of 10 am and 4 pm (not just when at beach or pool, but even with exercise, golf, tennis, and yard work!)  Use a sunscreen that says "broad spectrum" so it covers both UVA and UVB rays, and make sure to reapply every 1-2 hours.  Remember to change the batteries in your smoke detectors when changing your clock times in the spring and fall.  Carbon monoxide detectors are recommended for your home.  Use your seat belt every time you are in a car, and please drive safely and not be distracted with cell phones and texting while driving.    Andre Walker , Thank you for taking time to come for your Medicare Wellness Visit. I appreciate your ongoing commitment to your health goals. Please review the following plan we discussed and let me know if I can assist you in the future.   This is a list of the screening recommended for you and due dates:  Health Maintenance  Topic Date Due   Medicare Annual Wellness Visit  01/30/2022   COVID-19 Vaccine (6 - 2023-24 season) 03/26/2022   Hemoglobin A1C  07/31/2022    Complete foot exam   08/06/2022   Eye exam for diabetics  10/09/2022   DTaP/Tdap/Td vaccine (4 - Td or Tdap) 01/31/2031   Pneumonia Vaccine  Completed   Flu Shot  Completed   Zoster (Shingles) Vaccine  Completed   HPV Vaccine  Aged Out   Your COVID vaccines are up to date (ignore the date in the chart above).  I recommend getting the new RSV vaccine. You need to get this from the pharmacy, and should separate it from your recent COVID booster by 2 weeks.

## 2022-02-03 ENCOUNTER — Telehealth: Payer: Self-pay | Admitting: Family Medicine

## 2022-02-03 ENCOUNTER — Ambulatory Visit (INDEPENDENT_AMBULATORY_CARE_PROVIDER_SITE_OTHER): Payer: Medicare Other | Admitting: Family Medicine

## 2022-02-03 ENCOUNTER — Encounter: Payer: Self-pay | Admitting: Family Medicine

## 2022-02-03 VITALS — BP 110/60 | HR 68 | Ht 70.0 in | Wt 196.0 lb

## 2022-02-03 DIAGNOSIS — N400 Enlarged prostate without lower urinary tract symptoms: Secondary | ICD-10-CM

## 2022-02-03 DIAGNOSIS — M6289 Other specified disorders of muscle: Secondary | ICD-10-CM

## 2022-02-03 DIAGNOSIS — Z7901 Long term (current) use of anticoagulants: Secondary | ICD-10-CM

## 2022-02-03 DIAGNOSIS — D692 Other nonthrombocytopenic purpura: Secondary | ICD-10-CM

## 2022-02-03 DIAGNOSIS — E7800 Pure hypercholesterolemia, unspecified: Secondary | ICD-10-CM

## 2022-02-03 DIAGNOSIS — I482 Chronic atrial fibrillation, unspecified: Secondary | ICD-10-CM | POA: Diagnosis not present

## 2022-02-03 DIAGNOSIS — R809 Proteinuria, unspecified: Secondary | ICD-10-CM

## 2022-02-03 DIAGNOSIS — E038 Other specified hypothyroidism: Secondary | ICD-10-CM | POA: Diagnosis not present

## 2022-02-03 DIAGNOSIS — E1129 Type 2 diabetes mellitus with other diabetic kidney complication: Secondary | ICD-10-CM

## 2022-02-03 DIAGNOSIS — Z0000 Encounter for general adult medical examination without abnormal findings: Secondary | ICD-10-CM

## 2022-02-03 NOTE — Telephone Encounter (Signed)
Pt said he usually comes in for labs before his med check, please advise?

## 2022-02-04 NOTE — Progress Notes (Signed)
Cardiology Office Note   Date:  02/10/2022   ID:  Andre Walker, DOB 11-Jun-1932, MRN [id number]  PCP:  Rita Ohara, MD  Cardiologist:   Lasondra Hodgkins Martin[place], MD   Chief Complaint  Patient presents with   Atrial Fibrillation       History of Present Illness: Andre Walker is a 88 y.o. male who is seen for follow up atrial fibrillation. He has a history of DM and HLD. He was admitted in early December 2020 with acute cholecystitis. He was noted to be in atrial fibrillation with controlled rate. He underwent cholecystectomy as well as ERCP with sphincterotomy and stent. Was not started on anticoagulation due to surgery. Rate remained controlled on no rate control medication. Echo was done showing EF 50-55%. Moderate LAE. No significant valvular disease. [place] vasc score is 3. He was subsequently started on Eliquis as outpatient. Managed conservatively.   He denies any palpitations, dizziness, chest pain or SOB. He does note more problems with his balance. His exercise tolerance has dropped some.   He plays golf.  No bleeding on Eliquis. Lives at [facility].   Past Medical History:  Diagnosis Date   Actinic keratosis    Dr. Allyson Sabal   Atrial fibrillation [facility])    BPH (benign prostatic hyperplasia)    controlled on Hytrin   Colon polyps    Dr. Watt Climes   Diabetes mellitus 2004   Diverticulosis    ED (erectile dysfunction)    Gallstones    Hearing loss    high frequency   Hiatal hernia    small, noted on EGD 10/2010   LAE (left atrial enlargement) 01/2019   Moderate, Noted on ECHO   Melanoma ([place])    right arm   Microalbuminuria    on ACEI for microalbuminuria (does NOT have HTN)   Pneumonia 01/2019   Bilateral, noted on CT   Pure hypercholesterolemia 2006   Traumatic partial tear of biceps tendon 01/2009   "popeye" injury on right   Wears glasses    Wears hearing aid in both ears    Wears partial dentures    lower   Wears partial dentures    lower    Past Surgical  History:  Procedure Laterality Date   [place] N/A 01/28/2019   Procedure: BILIARY STENT PLACEMENT;  Surgeon: Arta Silence, MD;  Location: [facility];  Service: Endoscopy;  Laterality: N/A;   CATARACT EXTRACTION Bilateral R 05/07/15, L 05/21/15   Dr. Katy Fitch   CHOLECYSTECTOMY N/A 01/27/2019   Procedure: LAPAROSCOPIC CHOLECYSTECTOMY WITH INTRAOPEREATIVE CHOLANGIOGRAM;  Surgeon: Alphonsa Overall, MD;  Location: [facility];  Service: General;  Laterality: N/A;   COLONOSCOPY  11/09, 10/29/10   Dr. Watt Climes   ERCP N/A 01/28/2019   Procedure: ENDOSCOPIC RETROGRADE CHOLANGIOPANCREATOGRAPHY (ERCP);  Surgeon: Arta Silence, MD;  Location: [facility];  Service: Endoscopy;  Laterality: N/A;   ERCP N/A 04/12/2019   Procedure: ENDOSCOPIC RETROGRADE CHOLANGIOPANCREATOGRAPHY (ERCP);  Surgeon: Clarene Essex, MD;  Location: [facility];  Service: Endoscopy;  Laterality: N/A;  stent removal   ESOPHAGOGASTRODUODENOSCOPY  10/29/10   Dr. Watt Climes   INGUINAL HERNIA REPAIR Right 1989   MELANOMA EXCISION  10/08/10   R upper arm, Dr. Hassell Done   MELANOMA EXCISION  03/16/2012   Procedure: MELANOMA EXCISION;  Surgeon: Pedro Earls, MD;  Location: [place];  Service: General;  Laterality: Right;  excision of melanoma of right arm   REMOVAL OF STONES  01/28/2019   Procedure: REMOVAL OF STONES;  Surgeon: Arta Silence, MD;  Location: [facility];  Service: Endoscopy;;   SPHINCTEROTOMY  01/28/2019   Procedure: Joan Mayans;  Surgeon: Arta Silence, MD;  Location: [facility];  Service: Endoscopy;;   SPHINCTEROTOMY  04/12/2019   Procedure: Joan Mayans;  Surgeon: Clarene Essex, MD;  Location: [facility];  Service: Endoscopy;;  stone sludge   STENT REMOVAL  04/12/2019   Procedure: STENT REMOVAL;  Surgeon: Clarene Essex, MD;  Location: [facility];  Service: Endoscopy;;   TOTAL KNEE ARTHROPLASTY Right 2012   TOTAL KNEE ARTHROPLASTY Left 2005     Current Outpatient Medications   Medication Sig Dispense Refill   acetaminophen (TYLENOL) 325 MG tablet Take 650 mg by mouth every 6 (six) hours as needed for mild pain, moderate pain or headache.     apixaban (ELIQUIS) 5 MG TABS tablet Take 1 tablet (5 mg total) by mouth 2 (two) times daily. Take 1 tablet by mouth twice daily 180 tablet 0   Ascorbic Acid (VITAMIN C PO) Take 2 tablets by mouth daily.     Calcium Carbonate-Vitamin D 600-400 MG-UNIT tablet Take 1 tablet by mouth 2 (two) times daily.     Coenzyme Q10 (COQ10) 100 MG CAPS Take 100 mg by mouth daily at 12 noon.     fluorouracil (EFUDEX) 5 % cream      Glucosamine-Chondroitin (COSAMIN DS PO) Take 1 tablet by mouth 2 (two) times daily. With lunch & supper     glucose blood (ONETOUCH VERIO) test strip USE 1 STRIP TO CHECK GLUCOSE ONCE DAILY DX:E11.69 100 each 3   Lancets (ONETOUCH DELICA PLUS [id number]) MISC USE 1  TO CHECK GLUCOSE ONCE DAILY 100 each 2   lisinopril (ZESTRIL) 5 MG tablet Take 1 tablet (5 mg total) by mouth daily. 90 tablet 3   loratadine (CLARITIN) 10 MG tablet Take 10 mg by mouth daily.     metFORMIN (GLUCOPHAGE-XR) 500 MG 24 hr tablet TAKE 3 TABLETS BY MOUTH ONCE DAILY WITH BREAKFAST 270 tablet 1   Multiple Vitamins-Minerals (MULTIVITAMIN WITH MINERALS) tablet Take 1 tablet by mouth daily.     Omega-3 Fatty Acids (FISH OIL PO) Take 1,600 mg by mouth daily with supper.      simvastatin (ZOCOR) 20 MG tablet TAKE 1 TABLET BY MOUTH AT BEDTIME 90 tablet 0   terazosin (HYTRIN) 5 MG capsule Take 1 capsule by mouth at bedtime 90 capsule 0   trospium (SANCTURA) 20 MG tablet Take 20 mg by mouth at bedtime.     No current facility-administered medications for this visit.    Allergies:   Penicillins    Social History:  The patient  reports that he has never smoked. He has never used smokeless tobacco. He reports current alcohol use. He reports that he does not use drugs.   Family History:  The patient's family history includes Cancer in his paternal  uncle; Diabetes in his brother, sister, and sister; Heart disease in his brother and paternal uncle; Hyperlipidemia in his brother; Hypertension in his brother; Stroke in his brother.    ROS:  Please see the history of present illness.   Otherwise, review of systems are positive for none.   All other systems are reviewed and negative.    PHYSICAL EXAM: VS:  BP 122/60   Pulse 82   Ht '5\' 10"'$  (1.778 m)   Wt 199 lb (90.3 kg)   SpO2 97%   BMI 28.55 kg/m  , BMI Body mass index  is 28.55 kg/m. GEN: Well nourished, well developed, in no acute distress  HEENT: normal  Neck: no JVD, carotid bruits, or masses Cardiac: IRRR; no murmurs, rubs, or gallops,no edema  Respiratory:  clear to auscultation bilaterally, normal work of breathing GI: soft, nontender, nondistended, + BS MS: no deformity or atrophy  Skin: warm and dry, no rash Neuro:  Strength and sensation are intact Psych: euthymic mood, full affect   EKG:  EKG is ordered today. It shows Afib rate 82.  Otherwise Normal. I have personally reviewed and interpreted this study.   Recent Labs: 01/29/2022: ALT 21; BUN 24; Creatinine, Ser 1.11; Hemoglobin 13.6; Platelets 286; Potassium 4.7; Sodium 138; TSH 8.650    Lipid Panel    Component Value Date/Time   CHOL 103 01/29/2022 0834   TRIG 90 01/29/2022 0834   HDL 40 01/29/2022 0834   CHOLHDL 2.6 01/29/2022 0834   CHOLHDL 3.0 11/26/2016 0745   VLDL 15 11/07/2015 0728   LDLCALC 45 01/29/2022 0834   LDLCALC 57 11/26/2016 0745      Wt Readings from Last 3 Encounters:  02/10/22 199 lb (90.3 kg)  02/03/22 196 lb (88.9 kg)  08/05/21 198 lb (89.8 kg)      Other studies Reviewed: Additional studies/ records that were reviewed today include:    Echo 01/25/19:IMPRESSIONS      1. Left ventricular ejection fraction, by visual estimation, is 50 to 55%. The left ventricle has low normal function. There is no left ventricular hypertrophy.  2. Left ventricular diastolic parameters are  indeterminate.  3. Global right ventricle has normal systolic function.The right ventricular size is normal. No increase in right ventricular wall thickness.  4. Left atrial size was moderately dilated.  5. Right atrial size was normal.  6. The mitral valve is normal in structure. No evidence of mitral valve regurgitation. No evidence of mitral stenosis.  7. The tricuspid valve is normal in structure. Tricuspid valve regurgitation is mild.  8. The aortic valve is tricuspid. Aortic valve regurgitation is mild. Mild aortic valve sclerosis without stenosis.  9. The pulmonic valve was grossly normal. Pulmonic valve regurgitation is mild. 10. Aortic dilatation noted. 11. There is mild dilatation of the aortic root measuring 39 mm. 12. Mildly elevated pulmonary artery systolic pressure. 13. The inferior vena cava is normal in size with greater than 50% respiratory variability, suggesting right atrial pressure of 3 mmHg.    ASSESSMENT AND PLAN:  1.  Atrial fibrillation persistent. Mali vasc score of 3. Rate is controlled on no rate control therapy. He is asymptomatic. I would recommend continued rate control only. On Eliquis 5 mg bid. Labs are OK  2. DM type 2 A1c 6.6%.   3. HTN controlled.   Current medicines are reviewed at length with the patient today.  The patient does not have concerns regarding medicines.  The following changes have been made:  See above.   Labs/ tests ordered today include:   No orders of the defined types were placed in this encounter.     Disposition:   FU with me in one year   Signed, Reedy Biernat Martinique, MD  02/10/2022 2:32 PM    [facility] [address] [place], [place], [address] Phone [phone], Fax [phone]

## 2022-02-04 NOTE — Telephone Encounter (Signed)
Pt informed

## 2022-02-04 NOTE — Telephone Encounter (Signed)
I prefer not to do them prior to next visit (as we have done in the past, this was intentional on my part).  The labs that will be done that day do not need to be done fasting (he can eat that day) He will get his A1c done at the visit (point of care, will get the results at the visit).  Blood counts will be done--have all been normal but needs monitoring, so doubt there will need to be any discussion about it. I would like to talk to him and see how he is doing before deciding if the thyroid test needs to be rechecked at the 6 month mark rather than the year--it all depends on how he is feeling at the time. So, I'd prefer to wait and draw the appropriate, nonfasting, labs at his visit.  But assure him that we will have the A1c results at the visit.  Please advise pt.

## 2022-02-10 ENCOUNTER — Ambulatory Visit: Payer: Medicare Other | Attending: Cardiology | Admitting: Cardiology

## 2022-02-10 ENCOUNTER — Encounter: Payer: Self-pay | Admitting: Cardiology

## 2022-02-10 VITALS — BP 122/60 | HR 82 | Ht 70.0 in | Wt 199.0 lb

## 2022-02-10 DIAGNOSIS — I10 Essential (primary) hypertension: Secondary | ICD-10-CM | POA: Insufficient documentation

## 2022-02-10 DIAGNOSIS — I4819 Other persistent atrial fibrillation: Secondary | ICD-10-CM | POA: Diagnosis present

## 2022-02-10 MED ORDER — APIXABAN 5 MG PO TABS: 5.0000 mg | ORAL_TABLET | Freq: Two times a day (BID) | ORAL | 3 refills | Status: DC

## 2022-03-12 ENCOUNTER — Encounter: Payer: Self-pay | Admitting: Family Medicine

## 2022-03-12 ENCOUNTER — Ambulatory Visit (INDEPENDENT_AMBULATORY_CARE_PROVIDER_SITE_OTHER): Payer: Medicare Other | Admitting: Family Medicine

## 2022-03-12 VITALS — BP 118/74 | HR 82 | Temp 96.8°F | Ht 70.0 in | Wt 193.4 lb

## 2022-03-12 DIAGNOSIS — R051 Acute cough: Secondary | ICD-10-CM | POA: Diagnosis not present

## 2022-03-12 DIAGNOSIS — J069 Acute upper respiratory infection, unspecified: Secondary | ICD-10-CM | POA: Diagnosis not present

## 2022-03-12 MED ORDER — BENZONATATE 200 MG PO CAPS: 200.0000 mg | ORAL_CAPSULE | Freq: Two times a day (BID) | ORAL | 0 refills | Status: DC | PRN

## 2022-03-12 NOTE — Progress Notes (Signed)
Chief Complaint  Patient presents with   Cough    Cough that started on Sunday 03/02/22. Worsened on 03/06/22. Checked for covid and was negative. Has taken mucinex and a whole bottle of mucinex fast max ([employer] brand). No fever, chills or body aches.    Wife had a cold, and "she gave it to me". She got better, but he has a persistent cough.   He states he last had a COVID test 2 weeks ago (which doesn't make sense with the dates given above, as that is prior to onset of his symptoms.)  He denies having any runny nose, only started with a cough.  He had a little sore throat at first, none now.    Cough is usually dry.  Yesterday he got up some green phlegm, occurred like that a few days prior as well; hasn't gotten anything up today.  He has been taking Mucinex DM, which helped "with the cold", but hasn't helped with the cough. He reports he also tried Delsym, but that didn't do much. He wakes up to void, and starts coughing when he gets up, able to get back to sleep. Cough doesn't wake him up.  He previously did well with benzonatate, and is requesting refill.   PMH, PSH, SH reviewed  Outpatient Encounter Medications as of 03/12/2022  Medication Sig Note   acetaminophen (TYLENOL) 325 MG tablet Take 650 mg by mouth every 6 (six) hours as needed for mild pain, moderate pain or headache. 03/12/2022: Took one at 3am   apixaban (ELIQUIS) 5 MG TABS tablet Take 1 tablet (5 mg total) by mouth 2 (two) times daily. Take 1 tablet by mouth twice daily    Ascorbic Acid (VITAMIN C PO) Take 2 tablets by mouth daily. 08/05/2021: Unsure of dose, takes 2/day as directed on bottle   Calcium Carbonate-Vitamin D 600-400 MG-UNIT tablet Take 1 tablet by mouth 2 (two) times daily.    Coenzyme Q10 (COQ10) 100 MG CAPS Take 100 mg by mouth daily at 12 noon. 07/11/2020: Taking '200mg'$  daily   dextromethorphan-guaiFENesin (MUCINEX DM) 30-600 MG 12hr tablet Take 2 tablets by mouth 2 (two) times daily. 03/12/2022: Last dose  this am.   fluorouracil (EFUDEX) 5 % cream     Glucosamine-Chondroitin (COSAMIN DS PO) Take 1 tablet by mouth 2 (two) times daily. With lunch & supper    glucose blood (ONETOUCH VERIO) test strip USE 1 STRIP TO CHECK GLUCOSE ONCE DAILY DX:E11.69    Lancets (ONETOUCH DELICA PLUS [id number]) MISC USE 1  TO CHECK GLUCOSE ONCE DAILY    lisinopril (ZESTRIL) 5 MG tablet Take 1 tablet (5 mg total) by mouth daily.    loratadine (CLARITIN) 10 MG tablet Take 10 mg by mouth daily.    metFORMIN (GLUCOPHAGE-XR) 500 MG 24 hr tablet TAKE 3 TABLETS BY MOUTH ONCE DAILY WITH BREAKFAST    Multiple Vitamins-Minerals (MULTIVITAMIN WITH MINERALS) tablet Take 1 tablet by mouth daily.    Omega-3 Fatty Acids (FISH OIL PO) Take 1,600 mg by mouth daily with supper.     simvastatin (ZOCOR) 20 MG tablet TAKE 1 TABLET BY MOUTH AT BEDTIME    terazosin (HYTRIN) 5 MG capsule Take 1 capsule by mouth at bedtime    trospium (SANCTURA) 20 MG tablet Take 20 mg by mouth at bedtime.    No facility-administered encounter medications on file as of 03/12/2022.   Allergies  Allergen Reactions   Penicillins Rash    Has patient had a PCN reaction causing immediate rash,  facial/tongue/throat swelling, SOB or lightheadedness with hypotension: No Has patient had a PCN reaction causing severe rash involving mucus membranes or skin necrosis: No Has patient had a PCN reaction that required hospitalization: No Has patient had a PCN reaction occurring within the last 10 years: No If all of the above answers are "NO", then may proceed with Cephalosporin use.    ROS: No fever/chills, n/v/d, urinary complaints. Denies chest pain, palpitations, pain with breathing, shortness of breath. Denies headaches or dizziness, no sinus pain.   PHYSICAL EXAM:  BP 118/74   Pulse 82   Temp (!) 96.8 F (36 C) (Tympanic)   Ht '5\' 10"'$  (1.778 m)   Wt 193 lb 6.4 oz (87.7 kg)   BMI 27.75 kg/m   Wt Readings from Last 3 Encounters:  03/12/22 193 lb 6.4 oz  (87.7 kg)  02/10/22 199 lb (90.3 kg)  02/03/22 196 lb (88.9 kg)   Well-appearing elderly male in no distress. Occasional dry cough noted during visit. Speaking comfortably. HEENT: PERRL, EOMI, conjunctiva and sclera are clear. TM's and EACs normal bilaterally Nasal mucosa is mildly edematous, with yellow-green mucus noted on the left.  Sinuses are nontender. OP--noted to have some white mucus posteriorly, otherwise normal Neck: no lymphadenopathy or mass Heart: irregularly irregular, normal rate, no murmur Lungs:  some upper respiratory sounds and ronchi initially, which cleared with cough. Skin: no rashes  Psych: normal mood, affect, hygiene and grooming Neuro: alert and oriented, normal gait, cranial nerves grossly intact   ASSESSMENT/PLAN:  Acute cough - Plan: benzonatate (TESSALON) 200 MG capsule  Viral upper respiratory tract infection   Drink plenty of water. Continue the Mucinex DM twice daily (this thins out the mucus that is draining down the back of the throat and contributing to your cough). You may use the benzonatate up to three times daily if needed for cough.  I encourage you to use saline nasal spray, as well as sinus rinses (sinus rinse kit or neti-pot; be sure to use distilled or boiled water, not tap water) at least once daily. Continue the loratadine daily.  Contact us for an antibiotic if you have ongoing discolored drainage, or if you develop a fever or worsening cough.

## 2022-03-12 NOTE — Patient Instructions (Addendum)
Drink plenty of water. Continue the Mucinex DM (12 hour version) twice daily (this thins out the mucus that is draining down the back of the throat and contributing to your cough). You may use the benzonatate up to three times daily if needed for cough.  I encourage you to use saline nasal spray, as well as sinus rinses (sinus rinse kit or neti-pot; be sure to use distilled or boiled water, not tap water) at least once daily. Continue the loratadine daily.  Do NOT take any Delsym syrup while taking Mucinex DM (they contain the SAME cough suppressant, and you don't want to double up on this).  Contact us for an antibiotic if you have ongoing discolored drainage, or if you develop a fever or worsening cough.

## 2022-04-02 ENCOUNTER — Other Ambulatory Visit: Payer: Self-pay | Admitting: Family Medicine

## 2022-04-02 DIAGNOSIS — N400 Enlarged prostate without lower urinary tract symptoms: Secondary | ICD-10-CM

## 2022-04-09 ENCOUNTER — Other Ambulatory Visit: Payer: Self-pay | Admitting: Family Medicine

## 2022-04-09 DIAGNOSIS — E1129 Type 2 diabetes mellitus with other diabetic kidney complication: Secondary | ICD-10-CM

## 2022-04-23 ENCOUNTER — Other Ambulatory Visit: Payer: Self-pay | Admitting: Family Medicine

## 2022-04-23 DIAGNOSIS — E7800 Pure hypercholesterolemia, unspecified: Secondary | ICD-10-CM

## 2022-06-03 ENCOUNTER — Other Ambulatory Visit: Payer: Self-pay | Admitting: Family Medicine

## 2022-06-09 ENCOUNTER — Other Ambulatory Visit: Payer: Self-pay | Admitting: *Deleted

## 2022-06-09 MED ORDER — ONETOUCH DELICA PLUS LANCET33G MISC: 2 refills | Status: DC

## 2022-06-11 ENCOUNTER — Telehealth: Payer: Self-pay

## 2022-06-11 ENCOUNTER — Other Ambulatory Visit: Payer: Self-pay | Admitting: *Deleted

## 2022-06-11 MED ORDER — ONETOUCH DELICA PLUS LANCET33G MISC: 3 refills | Status: DC

## 2022-06-11 NOTE — Telephone Encounter (Signed)
Sent!

## 2022-06-11 NOTE — Telephone Encounter (Signed)
Pharmacist from [facility] called to request a prescription for his lancets that includes both the diagnosis code and instructions on the same script in order for medicare to pay for the prescription.

## 2022-06-30 ENCOUNTER — Other Ambulatory Visit: Payer: Self-pay | Admitting: Family Medicine

## 2022-06-30 DIAGNOSIS — E1129 Type 2 diabetes mellitus with other diabetic kidney complication: Secondary | ICD-10-CM

## 2022-06-30 DIAGNOSIS — N400 Enlarged prostate without lower urinary tract symptoms: Secondary | ICD-10-CM

## 2022-07-01 NOTE — Progress Notes (Unsigned)
No chief complaint on file.   Patient presents for removal of cerumen   PMH, PSH, SH reviewed    ROS:    PHYSICAL EXAM:  There were no vitals taken for this visit.  Wt Readings from Last 3 Encounters:  03/12/22 193 lb 6.4 oz (87.7 kg)  02/10/22 199 lb (90.3 kg)  02/03/22 196 lb (88.9 kg)      ASSESSMENT/PLAN:

## 2022-07-02 ENCOUNTER — Ambulatory Visit (INDEPENDENT_AMBULATORY_CARE_PROVIDER_SITE_OTHER): Payer: Medicare Other | Admitting: Family Medicine

## 2022-07-02 ENCOUNTER — Encounter: Payer: Self-pay | Admitting: Family Medicine

## 2022-07-02 VITALS — BP 104/60 | HR 80 | Ht 69.5 in | Wt 196.6 lb

## 2022-07-02 DIAGNOSIS — H6121 Impacted cerumen, right ear: Secondary | ICD-10-CM | POA: Diagnosis not present

## 2022-07-10 ENCOUNTER — Other Ambulatory Visit: Payer: Self-pay | Admitting: Family Medicine

## 2022-07-10 DIAGNOSIS — E7800 Pure hypercholesterolemia, unspecified: Secondary | ICD-10-CM

## 2022-07-18 ENCOUNTER — Other Ambulatory Visit: Payer: Self-pay | Admitting: Family Medicine

## 2022-07-18 DIAGNOSIS — E7800 Pure hypercholesterolemia, unspecified: Secondary | ICD-10-CM

## 2022-07-18 NOTE — Telephone Encounter (Signed)
Already refilled on 5/16

## 2022-08-05 DIAGNOSIS — D6869 Other thrombophilia: Secondary | ICD-10-CM | POA: Insufficient documentation

## 2022-08-05 NOTE — Progress Notes (Unsigned)
No chief complaint on file.  Patient presents for 6 month follow-up on chronic problems.   Diabetes: He reports compliance with Metformin 1500mg  daily, and denies any side effects. Last A1c was 6.6% in December 2023 on this regimen. Sugars are running    He denies any sores or lesions on his feet, denies tingling. He reports very little sweets (1 or 2 desserts a week). He tries to limit serving sizes of bread, pasta, rice.  Denies hypoglycemia, polydipsia and polyuria.  Last eye exam was in 09/2021, no retinopathy.  He has h/o microalbuminuria, and continues on lisinopril without side effects. Denies dizziness or cough.   Hyperlipidemia follow-up: Patient is reportedly following a low-fat, low cholesterol diet. Compliant with medications (simvastatin) and denies medication side effects. Lipids were at goal on last check.  Lab Results  Component Value Date   CHOL 103 01/29/2022   HDL 40 01/29/2022   LDLCALC 45 01/29/2022   TRIG 90 01/29/2022   CHOLHDL 2.6 01/29/2022    Atrial fibrillation -- Noted after cholecystectomy in 2020, has persisted and is asymptomatic.  He last saw Dr. [place] in 01/2022.  No changes were made. He continues on anticoagulants. He has not required any rate-controlling medications.  He has some easy bruising, but denies any bleeding. He denies chest pain, palpitations, shortness of breath.   BPH:  Under the care of [facility].  He continues on Hytrin, and trospium nightly for OAB. His nocturia had decreased from 3-4x/night to 2x/night (at most up 3x to void). He has mild dry mouth. He feels like his stream is good, empties bladder well. Denies dysuria, hematuria.   Subclinical hypothyroidism: He has had some borderline and elevated TSH values in the past, never symptomatic. He denies fatigue, cold intolerance, hair/skin/nail changes.  No weight change or mood changes.  TPO was negative in December, but TSH was trending higher.  Component Ref Range &  Units 6 mo ago (01/29/22) 1 yr ago (07/31/21) 1 yr ago (01/28/21) 1 yr ago (08/13/20) 2 yr ago (07/09/20) 2 yr ago (01/11/20) 3 yr ago (01/24/19)  TSH 0.450 - 4.500 uIU/mL 8.650 High  7.330 High  7.920 High  4.750 High  6.500 High  7.690 High  2.541 R,    PMH, PSH, SH reviewed    ROS: No fever, chills, URI symptoms, headaches, dizziness, GI complaints.   No chest pain, palpitations, dyspnea on exertion.  Denies edema. Denies dysuria, hematuria. Nocturia has improved some with current regimen from urologist. Denies numbness, tingling, weakness, tremor, rashes or abnormal bleeding. +easy bruising   Moods are good.   PHYSICAL EXAM:  There were no vitals taken for this visit.  Wt Readings from Last 3 Encounters:  07/02/22 196 lb 9.6 oz (89.2 kg)  03/12/22 193 lb 6.4 oz (87.7 kg)  02/10/22 199 lb (90.3 kg)    Well developed, pleasant elderly male in no distress   HEENT: PERRL EOMI, conjunctiva and sclera are clear. Wearing hearing aids. Neck: no lymphadenopathy, thyromegaly or carotid bruit   Heart:  Fairly regular rhythm today. No murmur ***UPDATE if irreg irreg Lungs: clear bilaterally   Abdomen: soft, nontender, no mass.  Back: no CVA tenderness or spinal tenderness. Extremities: no edema, 2+ pulses   Skin: actinic changes and some purpura on forearms. Psych: normal mood, affect, hygiene and grooming   Neuro: alert and oriented, normal gait.    Diabetic foot exam--normal sensation to monofilament.  Thickened great toenails bilaterally.   ASSESSMENT/PLAN:   DIABETIC FOOT  EXAM  Consider COVID booster, Prevnar-20 Remind to get RSV in the Fall from pharmacy  Meds r/f'd x 90d in May  F/u 6 mos for AWV/med check

## 2022-08-05 NOTE — Patient Instructions (Incomplete)
I recommend getting the RSV vaccine from the pharmacy in the Fall.  You can get this the same day as your flu shot, or separate them by 2 weeks.  You should get the updated COVID vaccine when it becomes available (sometime in the Fall?) You want to make sure there is at least 4 months between today's COVID vaccine and a new one.

## 2022-08-06 ENCOUNTER — Encounter: Payer: Self-pay | Admitting: Family Medicine

## 2022-08-06 ENCOUNTER — Ambulatory Visit (INDEPENDENT_AMBULATORY_CARE_PROVIDER_SITE_OTHER): Payer: Medicare Other | Admitting: Family Medicine

## 2022-08-06 VITALS — BP 130/80 | HR 72 | Ht 70.0 in | Wt 197.6 lb

## 2022-08-06 DIAGNOSIS — D6869 Other thrombophilia: Secondary | ICD-10-CM

## 2022-08-06 DIAGNOSIS — E1129 Type 2 diabetes mellitus with other diabetic kidney complication: Secondary | ICD-10-CM

## 2022-08-06 DIAGNOSIS — I482 Chronic atrial fibrillation, unspecified: Secondary | ICD-10-CM | POA: Diagnosis not present

## 2022-08-06 DIAGNOSIS — Z5181 Encounter for therapeutic drug level monitoring: Secondary | ICD-10-CM

## 2022-08-06 DIAGNOSIS — R809 Proteinuria, unspecified: Secondary | ICD-10-CM | POA: Diagnosis not present

## 2022-08-06 DIAGNOSIS — Z7901 Long term (current) use of anticoagulants: Secondary | ICD-10-CM

## 2022-08-06 DIAGNOSIS — Z23 Encounter for immunization: Secondary | ICD-10-CM | POA: Diagnosis not present

## 2022-08-06 DIAGNOSIS — E038 Other specified hypothyroidism: Secondary | ICD-10-CM | POA: Diagnosis not present

## 2022-08-06 DIAGNOSIS — D692 Other nonthrombocytopenic purpura: Secondary | ICD-10-CM | POA: Diagnosis not present

## 2022-08-06 DIAGNOSIS — E7800 Pure hypercholesterolemia, unspecified: Secondary | ICD-10-CM

## 2022-08-06 DIAGNOSIS — N400 Enlarged prostate without lower urinary tract symptoms: Secondary | ICD-10-CM

## 2022-08-06 LAB — POCT GLYCOSYLATED HEMOGLOBIN (HGB A1C): Hemoglobin A1C: 6.4 % — AB (ref 4.0–5.6)

## 2022-08-07 LAB — CBC WITH DIFFERENTIAL/PLATELET
Basophils Absolute: 0.1 10*3/uL (ref 0.0–0.2)
Basos: 1 %
EOS (ABSOLUTE): 0.1 10*3/uL (ref 0.0–0.4)
Eos: 1 %
Hematocrit: 38.2 % (ref 37.5–51.0)
Hemoglobin: 13 g/dL (ref 13.0–17.7)
Immature Grans (Abs): 0 10*3/uL (ref 0.0–0.1)
Immature Granulocytes: 1 %
Lymphocytes Absolute: 1.9 10*3/uL (ref 0.7–3.1)
Lymphs: 23 %
MCH: 32.6 pg (ref 26.6–33.0)
MCHC: 34 g/dL (ref 31.5–35.7)
MCV: 96 fL (ref 79–97)
Monocytes Absolute: 0.6 10*3/uL (ref 0.1–0.9)
Monocytes: 7 %
Neutrophils Absolute: 5.7 10*3/uL (ref 1.4–7.0)
Neutrophils: 67 %
Platelets: 260 10*3/uL (ref 150–450)
RBC: 3.99 x10E6/uL — ABNORMAL LOW (ref 4.14–5.80)
RDW: 12 % (ref 11.6–15.4)
WBC: 8.4 10*3/uL (ref 3.4–10.8)

## 2022-08-07 LAB — CMP14+EGFR
ALT: 18 IU/L (ref 0–44)
AST: 17 IU/L (ref 0–40)
Albumin/Globulin Ratio: 1.9
Albumin: 4.4 g/dL (ref 3.7–4.7)
Alkaline Phosphatase: 78 IU/L (ref 44–121)
BUN/Creatinine Ratio: 28 — ABNORMAL HIGH (ref 10–24)
BUN: 29 mg/dL — ABNORMAL HIGH (ref 8–27)
Bilirubin Total: 0.5 mg/dL (ref 0.0–1.2)
CO2: 21 mmol/L (ref 20–29)
Calcium: 9.6 mg/dL (ref 8.6–10.2)
Chloride: 105 mmol/L (ref 96–106)
Creatinine, Ser: 1.05 mg/dL (ref 0.76–1.27)
Globulin, Total: 2.3 g/dL (ref 1.5–4.5)
Glucose: 119 mg/dL — ABNORMAL HIGH (ref 70–99)
Potassium: 4.7 mmol/L (ref 3.5–5.2)
Sodium: 139 mmol/L (ref 134–144)
Total Protein: 6.7 g/dL (ref 6.0–8.5)
eGFR: 68 mL/min/{1.73_m2} (ref >59)

## 2022-08-07 LAB — TSH: TSH: 4.51 u[IU]/mL — ABNORMAL HIGH (ref 0.450–4.500)

## 2022-09-30 ENCOUNTER — Other Ambulatory Visit: Payer: Self-pay | Admitting: Family Medicine

## 2022-09-30 DIAGNOSIS — N400 Enlarged prostate without lower urinary tract symptoms: Secondary | ICD-10-CM

## 2022-10-08 ENCOUNTER — Telehealth: Payer: Self-pay | Admitting: Family Medicine

## 2022-10-08 ENCOUNTER — Other Ambulatory Visit: Payer: Self-pay

## 2022-10-08 DIAGNOSIS — E7800 Pure hypercholesterolemia, unspecified: Secondary | ICD-10-CM

## 2022-10-08 DIAGNOSIS — E1129 Type 2 diabetes mellitus with other diabetic kidney complication: Secondary | ICD-10-CM

## 2022-10-08 MED ORDER — SIMVASTATIN 20 MG PO TABS
20.0000 mg | ORAL_TABLET | Freq: Every day | ORAL | 0 refills | Status: DC
Start: 2022-10-08 — End: 2022-12-30

## 2022-10-08 MED ORDER — LISINOPRIL 5 MG PO TABS
5.0000 mg | ORAL_TABLET | Freq: Every day | ORAL | 0 refills | Status: DC
Start: 2022-10-08 — End: 2023-01-12

## 2022-10-08 MED ORDER — METFORMIN HCL ER 500 MG PO TB24
ORAL_TABLET | ORAL | 0 refills | Status: DC
Start: 2022-10-08 — End: 2022-12-30

## 2022-10-08 NOTE — Telephone Encounter (Signed)
Pt needs refills on simvastatin    Lisinopril    metformin

## 2022-10-14 ENCOUNTER — Other Ambulatory Visit: Payer: Self-pay | Admitting: Family Medicine

## 2022-10-14 DIAGNOSIS — E7800 Pure hypercholesterolemia, unspecified: Secondary | ICD-10-CM

## 2022-10-22 LAB — HM DIABETES EYE EXAM

## 2022-11-10 ENCOUNTER — Encounter: Payer: Self-pay | Admitting: *Deleted

## 2022-12-30 ENCOUNTER — Other Ambulatory Visit: Payer: Self-pay | Admitting: Family Medicine

## 2022-12-30 DIAGNOSIS — E7800 Pure hypercholesterolemia, unspecified: Secondary | ICD-10-CM

## 2022-12-30 DIAGNOSIS — E1129 Type 2 diabetes mellitus with other diabetic kidney complication: Secondary | ICD-10-CM

## 2023-01-02 ENCOUNTER — Other Ambulatory Visit: Payer: Self-pay | Admitting: Family Medicine

## 2023-01-02 DIAGNOSIS — E7800 Pure hypercholesterolemia, unspecified: Secondary | ICD-10-CM

## 2023-01-12 ENCOUNTER — Other Ambulatory Visit: Payer: Self-pay | Admitting: Family Medicine

## 2023-01-12 DIAGNOSIS — R809 Proteinuria, unspecified: Secondary | ICD-10-CM

## 2023-02-09 ENCOUNTER — Telehealth: Payer: Self-pay | Admitting: *Deleted

## 2023-02-09 NOTE — Telephone Encounter (Signed)
Sent!

## 2023-02-09 NOTE — Telephone Encounter (Signed)
Patient called and asked for an rx for a rolling walker with a seat for help walking to the dining room. Would like faxed to Bertram Millard at [facility] @ [phone]. Needs a little help with balance.

## 2023-02-12 NOTE — Progress Notes (Signed)
Cardiology Office Note   Date:  02/19/2023   ID:  Andre Walker, DOB September 23, 1932, MRN [id number]  PCP:  Andre Arrow, MD  Cardiologist:   Andre Fosse [place], MD   Chief Complaint  Patient presents with   Atrial Fibrillation       History of Present Illness: Andre Walker is a 89 y.o. male who is seen for follow up atrial fibrillation. He has a history of DM and HLD. He was admitted in early December 2020 with acute cholecystitis. He was noted to be in atrial fibrillation with controlled rate. He underwent cholecystectomy as well as ERCP with sphincterotomy and stent. Was not started on anticoagulation due to surgery. Rate remained controlled on no rate control medication. Echo was done showing EF 50-55%. Moderate LAE. No significant valvular disease. [place] vasc score is 3. He was subsequently started on Eliquis as outpatient. Managed conservatively.   He denies any palpitations, dizziness, chest pain or SOB. He does note more problems with his balance. He is currently getting physical therapy. Now has a walker. No falls.   He plays golf.  No bleeding on Eliquis. Lives at [facility].   Past Medical History:  Diagnosis Date   Actinic keratosis    Dr. Terri Walker   Atrial fibrillation [facility])    BPH (benign prostatic hyperplasia)    controlled on Hytrin   Colon polyps    Dr. Ewing Walker   Diabetes mellitus 2004   Diverticulosis    ED (erectile dysfunction)    Gallstones    Hearing loss    high frequency   Hiatal hernia    small, noted on EGD 10/2010   LAE (left atrial enlargement) 01/2019   Moderate, Noted on ECHO   Melanoma (HCC)    right arm   Microalbuminuria    on ACEI for microalbuminuria (does NOT have HTN)   Pneumonia 01/2019   Bilateral, noted on CT   Pure hypercholesterolemia 2006   Traumatic partial tear of biceps tendon 01/2009   "popeye" injury on right   Wears glasses    Wears hearing aid in both ears    Wears partial dentures    lower   Wears partial dentures     lower    Past Surgical History:  Procedure Laterality Date   APPENDECTOMY  1955   BILIARY STENT PLACEMENT N/A 01/28/2019   Procedure: BILIARY STENT PLACEMENT;  Surgeon: Andre Modena, MD;  Location: [facility];  Service: Endoscopy;  Laterality: N/A;   CATARACT EXTRACTION Bilateral R 05/07/15, L 05/21/15   Dr. Dione Walker   CHOLECYSTECTOMY N/A 01/27/2019   Procedure: LAPAROSCOPIC CHOLECYSTECTOMY WITH INTRAOPEREATIVE CHOLANGIOGRAM;  Surgeon: Andre Kin, MD;  Location: [facility];  Service: General;  Laterality: N/A;   COLONOSCOPY  11/09, 10/29/10   Dr. Ewing Walker   ERCP N/A 01/28/2019   Procedure: ENDOSCOPIC RETROGRADE CHOLANGIOPANCREATOGRAPHY (ERCP);  Surgeon: Andre Modena, MD;  Location: [facility];  Service: Endoscopy;  Laterality: N/A;   ERCP N/A 04/12/2019   Procedure: ENDOSCOPIC RETROGRADE CHOLANGIOPANCREATOGRAPHY (ERCP);  Surgeon: Andre Rigger, MD;  Location: [facility];  Service: Endoscopy;  Laterality: N/A;  stent removal   ESOPHAGOGASTRODUODENOSCOPY  10/29/10   Dr. Ewing Walker   INGUINAL HERNIA REPAIR Right 1989   MELANOMA EXCISION  10/08/10   R upper arm, Dr. Daphine Walker   MELANOMA EXCISION  03/16/2012   Procedure: MELANOMA EXCISION;  Surgeon: Andre Merino, MD;  Location: [facility];  Service: General;  Laterality: Right;  excision of melanoma of right arm  REMOVAL OF STONES  01/28/2019   Procedure: REMOVAL OF STONES;  Surgeon: Andre Modena, MD;  Location: [facility];  Service: Endoscopy;;   SPHINCTEROTOMY  01/28/2019   Procedure: Andre Walker;  Surgeon: Andre Modena, MD;  Location: [facility];  Service: Endoscopy;;   SPHINCTEROTOMY  04/12/2019   Procedure: Andre Walker;  Surgeon: Andre Rigger, MD;  Location: [facility];  Service: Endoscopy;;  stone sludge   STENT REMOVAL  04/12/2019   Procedure: STENT REMOVAL;  Surgeon: Andre Rigger, MD;  Location: [facility];  Service: Endoscopy;;   TOTAL KNEE ARTHROPLASTY Right 2012   TOTAL KNEE ARTHROPLASTY Left 2005      Current Outpatient Medications  Medication Sig Dispense Refill   acetaminophen (TYLENOL) 325 MG tablet Take 650 mg by mouth every 6 (six) hours as needed for mild pain (pain score 1-3), moderate pain (pain score 4-6) or headache.     apixaban (ELIQUIS) 5 MG TABS tablet Take 1 tablet (5 mg total) by mouth 2 (two) times daily. Take 1 tablet by mouth twice daily 180 tablet 3   Ascorbic Acid (VITAMIN C PO) Take 2 tablets by mouth daily.     Calcium Carbonate-Vitamin D 600-400 MG-UNIT tablet Take 1 tablet by mouth 2 (two) times daily.     Coenzyme Q10 (COQ10) 100 MG CAPS Take 100 mg by mouth daily at 12 noon.     fluorouracil (EFUDEX) 5 % cream      Glucosamine-Chondroitin (COSAMIN DS PO) Take 1 tablet by mouth 2 (two) times daily. With lunch & supper     glucose blood (ONETOUCH VERIO) test strip USE 1 STRIP TO CHECK GLUCOSE ONCE DAILY DX:E11.69 100 each 3   hydrocortisone cream 1 % Apply 1 Application topically 2 (two) times daily.     Lancets (ONETOUCH DELICA PLUS LANCET33G) MISC Patient to check blood sugar once daily(usually in the morning). 100 each 3   lisinopril (ZESTRIL) 5 MG tablet Take 1 tablet by mouth once daily 90 tablet 0   loratadine (CLARITIN) 10 MG tablet Take 10 mg by mouth daily.     metFORMIN (GLUCOPHAGE-XR) 500 MG 24 hr tablet TAKE 3 TABLETS BY MOUTH ONCE DAILY WITH BREAKFAST 270 tablet 0   Multiple Vitamins-Minerals (MULTIVITAMIN WITH MINERALS) tablet Take 1 tablet by mouth daily.     Omega-3 Fatty Acids (FISH OIL PO) Take 1,600 mg by mouth daily with supper.      simvastatin (ZOCOR) 20 MG tablet TAKE 1 TABLET BY MOUTH AT BEDTIME 90 tablet 0   terazosin (HYTRIN) 5 MG capsule Take 1 capsule by mouth at bedtime 90 capsule 1   trospium (SANCTURA) 20 MG tablet Take 20 mg by mouth at bedtime.     No current facility-administered medications for this visit.    Allergies:   Penicillins    Social History:  The patient  reports that he has never smoked. He has never used  smokeless tobacco. He reports current alcohol use. He reports that he does not use drugs.   Family History:  The patient's family history includes Cancer in his paternal uncle; Diabetes in his brother, sister, and sister; Heart disease in his brother and paternal uncle; Hyperlipidemia in his brother; Hypertension in his brother; Stroke in his brother.    ROS:  Please see the history of present illness.   Otherwise, review of systems are positive for none.   All other systems are reviewed and negative.    PHYSICAL EXAM: VS:  BP (!) 120/50   Pulse 72  Ht 5\' 10"  (1.778 m)   Wt 194 lb (88 kg)   SpO2 98%   BMI 27.84 kg/m  , BMI Body mass index is 27.84 kg/m. GEN: Well nourished, well developed, in no acute distress  HEENT: normal  Neck: no JVD, carotid bruits, or masses Cardiac: IRRR; no murmurs, rubs, or gallops,no edema  Respiratory:  clear to auscultation bilaterally, normal work of breathing GI: soft, nontender, nondistended, + BS MS: no deformity or atrophy  Skin: warm and dry, no rash Neuro:  Strength and sensation are intact Psych: euthymic mood, full affect   EKG Interpretation Date/Time:  Thursday February 19 2023 08:19:32 EST Ventricular Rate:  71 PR Interval:    QRS Duration:  106 QT Interval:  382 QTC Calculation: 415 R Axis:   -41  Text Interpretation: Atrial fibrillation Left axis deviation When compared with ECG of 27-Jan-2019 13:00, QRS axis Shifted left Confirmed by Walker, Jessye Imhoff [phone]) on 02/19/2023 8:22:19 AM     Recent Labs: 08/06/2022: ALT 18; BUN 29; Creatinine, Ser 1.05; Hemoglobin 13.0; Platelets 260; Potassium 4.7; Sodium 139; TSH 4.510    Lipid Panel    Component Value Date/Time   CHOL 103 01/29/2022 0834   TRIG 90 01/29/2022 0834   HDL 40 01/29/2022 0834   CHOLHDL 2.6 01/29/2022 0834   CHOLHDL 3.0 11/26/2016 0745   VLDL 15 11/07/2015 0728   LDLCALC 45 01/29/2022 0834   LDLCALC 57 11/26/2016 0745      Wt Readings from Last 3  Encounters:  02/19/23 194 lb (88 kg)  08/06/22 197 lb 9.6 oz (89.6 kg)  07/02/22 196 lb 9.6 oz (89.2 kg)      Other studies Reviewed: Additional studies/ records that were reviewed today include:    Echo 01/25/19:IMPRESSIONS      1. Left ventricular ejection fraction, by visual estimation, is 50 to 55%. The left ventricle has low normal function. There is no left ventricular hypertrophy.  2. Left ventricular diastolic parameters are indeterminate.  3. Global right ventricle has normal systolic function.The right ventricular size is normal. No increase in right ventricular wall thickness.  4. Left atrial size was moderately dilated.  5. Right atrial size was normal.  6. The mitral valve is normal in structure. No evidence of mitral valve regurgitation. No evidence of mitral stenosis.  7. The tricuspid valve is normal in structure. Tricuspid valve regurgitation is mild.  8. The aortic valve is tricuspid. Aortic valve regurgitation is mild. Mild aortic valve sclerosis without stenosis.  9. The pulmonic valve was grossly normal. Pulmonic valve regurgitation is mild. 10. Aortic dilatation noted. 11. There is mild dilatation of the aortic root measuring 39 mm. 12. Mildly elevated pulmonary artery systolic pressure. 13. The inferior vena cava is normal in size with greater than 50% respiratory variability, suggesting right atrial pressure of 3 mmHg.    ASSESSMENT AND PLAN:  1.  Atrial fibrillation persistent. [place] vasc score of 3. Rate is controlled on no rate control therapy. He is asymptomatic. I would recommend continued rate control only. On Eliquis 5 mg bid. Labs are OK  2. DM type 2 A1c 6.4%.   3. HTN controlled.   Current medicines are reviewed at length with the patient today.  The patient does not have concerns regarding medicines.  The following changes have been made:  See above.   Labs/ tests ordered today include:   Orders Placed This Encounter  Procedures   EKG  12-Lead      Disposition:  FU with me in one year   Signed, Anatole Apollo [place], MD  02/19/2023 8:37 AM    [facility] [address] [place], [place], [address] Phone [phone], Fax [phone]

## 2023-02-19 ENCOUNTER — Ambulatory Visit: Payer: Medicare Other | Attending: Cardiology | Admitting: Cardiology

## 2023-02-19 ENCOUNTER — Encounter: Payer: Self-pay | Admitting: Cardiology

## 2023-02-19 VITALS — BP 120/50 | HR 72 | Ht 70.0 in | Wt 194.0 lb

## 2023-02-19 DIAGNOSIS — I10 Essential (primary) hypertension: Secondary | ICD-10-CM | POA: Diagnosis present

## 2023-02-19 DIAGNOSIS — I4819 Other persistent atrial fibrillation: Secondary | ICD-10-CM | POA: Diagnosis present

## 2023-02-19 NOTE — Patient Instructions (Signed)
Medication Instructions:  Continue same medications *If you need a refill on your cardiac medications before your next appointment, please call your pharmacy*   Lab Work: None ordered   Testing/Procedures: None ordered   Follow-Up: At [facility], you and your health needs are our priority.  As part of our continuing mission to provide you with exceptional heart care, we have created designated Provider Care Teams.  These Care Teams include your primary Cardiologist (physician) and Advanced Practice Providers (APPs -  Physician Assistants and Nurse Practitioners) who all work together to provide you with the care you need, when you need it.  We recommend signing up for the patient portal called "MyChart".  Sign up information is provided on this After Visit Summary.  MyChart is used to connect with patients for Virtual Visits (Telemedicine).  Patients are able to view lab/test results, encounter notes, upcoming appointments, etc.  Non-urgent messages can be sent to your provider as well.   To learn more about what you can do with MyChart, go to [email or web address].    Your next appointment:  1 year  Call in Sept to schedule Dec appointment     Provider:  Dr.Jordan

## 2023-03-04 ENCOUNTER — Other Ambulatory Visit: Payer: Medicare Other

## 2023-03-04 DIAGNOSIS — R809 Proteinuria, unspecified: Secondary | ICD-10-CM

## 2023-03-04 DIAGNOSIS — Z5181 Encounter for therapeutic drug level monitoring: Secondary | ICD-10-CM

## 2023-03-04 DIAGNOSIS — Z7901 Long term (current) use of anticoagulants: Secondary | ICD-10-CM

## 2023-03-04 DIAGNOSIS — E7800 Pure hypercholesterolemia, unspecified: Secondary | ICD-10-CM

## 2023-03-04 LAB — LIPID PANEL

## 2023-03-06 LAB — MICROALBUMIN / CREATININE URINE RATIO
Creatinine, Urine: 68.7 mg/dL
Microalb/Creat Ratio: 17 mg/g{creat} (ref 0–29)
Microalbumin, Urine: 11.8 ug/mL

## 2023-03-06 LAB — CBC WITH DIFFERENTIAL/PLATELET
Basophils Absolute: 0 10*3/uL (ref 0.0–0.2)
Basos: 1 %
EOS (ABSOLUTE): 0.3 10*3/uL (ref 0.0–0.4)
Eos: 4 %
Hematocrit: 40.5 % (ref 37.5–51.0)
Hemoglobin: 13.6 g/dL (ref 13.0–17.7)
Immature Grans (Abs): 0 10*3/uL (ref 0.0–0.1)
Immature Granulocytes: 0 %
Lymphocytes Absolute: 2.4 10*3/uL (ref 0.7–3.1)
Lymphs: 32 %
MCH: 32.5 pg (ref 26.6–33.0)
MCHC: 33.6 g/dL (ref 31.5–35.7)
MCV: 97 fL (ref 79–97)
Monocytes Absolute: 0.6 10*3/uL (ref 0.1–0.9)
Monocytes: 8 %
Neutrophils Absolute: 4.1 10*3/uL (ref 1.4–7.0)
Neutrophils: 55 %
Platelets: 271 10*3/uL (ref 150–450)
RBC: 4.19 x10E6/uL (ref 4.14–5.80)
RDW: 12.1 % (ref 11.6–15.4)
WBC: 7.3 10*3/uL (ref 3.4–10.8)

## 2023-03-06 LAB — LIPID PANEL
Chol/HDL Ratio: 2.5 {ratio} (ref 0.0–5.0)
Cholesterol, Total: 124 mg/dL (ref 100–199)
HDL: 50 mg/dL (ref >39)
LDL Chol Calc (NIH): 60 mg/dL (ref 0–99)
Triglycerides: 67 mg/dL (ref 0–149)
VLDL Cholesterol Cal: 14 mg/dL (ref 5–40)

## 2023-03-08 NOTE — Progress Notes (Deleted)
 No chief complaint on file.  Andre Walker is a 89 y.o. male who presents for annual wellness visit and follow-up on chronic medical conditions.  He had labs done prior to his visit (see below).  He has been having more trouble getting around. He had asked for a rolling walker with a seat for help walking to the dining room, was having some balance issues. He has gotten this  UPDATE   At his visit in June he noted a tremor in the R hand, handwriting was worse.    Diabetes: He reports compliance with Metformin  1500mg  daily, and denies any side effects. Last A1c was 6.4% in 07/2022 on this regimen. Sugars are ***   He denies any sores or lesions on his feet, denies tingling. He reports very little sweets (1 or 2 desserts a week). He tries to limit serving sizes of bread, pasta, rice.  Denies hypoglycemia, polydipsia and polyuria.  Last eye exam was in 09/2022. He has h/o microalbuminuria, and continues on lisinopril  without side effects. Denies dizziness or cough.    Hyperlipidemia follow-up: Patient is reportedly following a low-fat, low cholesterol diet. Compliant with medications (simvastatin ) and denies medication side effects.       Atrial fibrillation -- Noted after cholecystectomy in 2020, has persisted and is asymptomatic.  He last saw Dr. Jordan in 01/2023.  No changes were made. He continues on anticoagulants. He has not required any rate-controlling medications.  He has some easy bruising, but denies any bleeding. He denies chest pain, palpitations, shortness of breath.   BPH:  Under the care of [facility], last seen in 07/2022.  He continues on Hytrin , and trospium nightly for OAB. His nocturia had decreased from 3-4x/night to 2x/night (at most up 3x to void). He has mild dry mouth, tolerable. He feels like his stream is good, empties bladder well. Denies dysuria, hematuria.   Subclinical hypothyroidism: He has had some borderline and elevated TSH values in the past,  never symptomatic. Negative TPO in 01/2022.  Most recent TSH was lower than it had been, at 4.510. He denies fatigue, cold intolerance, hair/skin/nail changes.  No weight change or mood changes.    Component Ref Range & Units (hover) 7 mo ago (08/06/22) 1 yr ago (01/29/22) 1 yr ago (07/31/21) 2 yr ago (01/28/21) 2 yr ago (08/13/20) 2 yr ago (07/09/20) 3 yr ago (01/11/20)  TSH 4.510 High  8.650 High  7.330 High  7.920 High  4.750 High  6.500 High  7.690 High      Immunization History  Administered Date(s) Administered   Fluad Quad(high Dose 65+) 12/22/2018, 11/17/2019, 12/11/2020, 11/24/2021   Influenza Split 11/25/2007, 10/26/2008, 12/02/2010, 12/03/2011   Influenza, High Dose Seasonal PF 12/14/2009, 12/01/2012, 11/24/2013, 12/12/2013, 12/06/2014, 12/15/2014, 11/13/2015, 11/26/2016, 12/07/2017   Moderna Covid-19 Vaccine Bivalent Booster 90yrs & up 12/11/2020   Moderna SARS-COV2 Booster Vaccination 07/09/2020   Moderna Sars-Covid-2 Vaccination 02/28/2019, 03/28/2019, 01/03/2020   PNEUMOCOCCAL CONJUGATE-20 08/06/2022   Pfizer(Comirnaty)Fall Seasonal Vaccine 12 years and older 01/29/2022, 08/06/2022   Pneumococcal Conjugate-13 02/24/2013, 09/12/2013   Pneumococcal Polysaccharide-23 12/25/2004, 06/04/2011, 06/07/2011, 12/03/2016   Td 04/24/2004   Tdap 12/02/2010, 01/30/2021   Zoster Recombinant(Shingrix) 06/30/2017, 10/11/2017   Zoster, Live 10/25/2007   Last colonoscopy: 12/2015, showing int/external hemorrhoids, hyperplastic polyp and diverticulosis Last PSA: no longer being screened; under care of urologist.  Dentist: twice yearly Ophtho: yearly Exercise:   He walks regularly (at least 30 minutes, sometimes 60, but stops to rest after 30). He does  some yoga classes, and still goes to the exercise room 3 days/week, weights, bike (with arms, 30 minutes). Golfs once a week (weather-dependent).  Patient Care Team: Randol Dawes, MD as PCP - General (Family Medicine) Dr. Cosette Hai as  Consulting Physician (Dentistry) Dr. Jordan (cardiology) Dr. Devere (urologist)  Reda Piety (at [facility]) Dr. Octavia (ophtho) --yearly Dr. Rosalie (GI)  [facility] hearing Dr. Gladis (general surgeon, did melanoma excision, no regular f/u)   Depression Screening: Flowsheet Row Office Visit from 02/03/2022 in [facility]  PHQ-2 Total Score 0        Falls screen:     07/02/2022    9:24 AM 02/03/2022    1:53 PM 01/30/2021    1:38 PM 01/16/2020    9:26 AM 12/27/2018    1:53 PM  Fall Risk   Falls in the past year? 0 0 0 0 0  Number falls in past yr: 0 0 0 0   Injury with Fall? 0 0 0 0   Risk for fall due to : No Fall Risks No Fall Risks No Fall Risks    Follow up Falls evaluation completed Falls evaluation completed Falls evaluation completed       Functional Status Survey:        End of Life Discussion:  Patient has a living will and medical power of attorney, scanned in chart.   PMH, PSH, SH and FH were reviewed and updated     ROS: The patient denies anorexia, fever, headaches, vision loss, ear pain, hoarseness, chest pain, palpitations, dizziness, syncope, dyspnea on exertion, cough, nausea, vomiting, diarrhea, constipation, abdominal pain, melena, hematochezia, indigestion/heartburn, hematuria, incontinence, weakened urine stream, dysuria, genital lesions, numbness, tingling, weakness, tremor, rashes, depression, anxiety, abnormal bleeding (some easy bruising), or enlarged lymph nodes.    Rare heartburn, about once a month (spicy, spaghetti), uses Tums prn, rarely. 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 Up 2-3x/night to void.  Rare slight leakage (urge incontinence) He isn't as steady when he first stands up, but waits and steadies himself and is fine.  No falls. Leg fatigue after 30 minutes of walking, per HPI. Sees derm regularly    PHYSICAL EXAM:  There were no vitals taken for this visit.  Wt Readings  from Last 3 Encounters:  02/19/23 194 lb (88 kg)  08/06/22 197 lb 9.6 oz (89.6 kg)  07/02/22 196 lb 9.6 oz (89.2 kg)    General Appearance:   Alert, cooperative, no distress, appears stated age    Head:   Normocephalic, without obvious abnormality, atraumatic    Eyes:   PERRL, conjunctiva/corneas clear, EOM's intact, fundi normal  Ears:   Wearing hearing aids bilaterally.   Nose:   No drainage or sinus tenderness  Throat:   Normal mucosa  Neck:   Supple, no lymphadenopathy; thyroid : no enlargement/ tenderness/nodules; no carotid bruit or JVD    Back:   Spine nontender, no curvature, ROM normal, no CVA tenderness. Scoliosis and prominence/kyphosis of the lower lumbar spine. nontender.  Lungs:   Clear to auscultation bilaterally without wheezes, rales or ronchi; respirations unlabored    Chest Wall:   No tenderness or deformity    Heart:   Irregularly irregular, S1 and S2 normal, no murmur, rub or gallop.   Breast Exam:   No chest wall tenderness, masses or gynecomastia    Abdomen:   Soft, non-tender, nondistended, normoactive bowel sounds, no masses, no hepatosplenomegaly. Small, soft, easily reducible umbilical hernia  Genitalia:  Deferred to urologist (per pt request, has f/u scheduled)  Rectal:   Deferred to urologist  Extremities:   No clubbing, cyanosis Trace edema at ankles only  Pulses:   2+ and symmetric all extremities    Skin:   Skin color, texture, turgor normal, no rashes. Senile purpura on legs Many AK's on forearms, hands, legs. Onychomycotic nails (all on R except the 4th toe, and only on great toenail on the L).  Callous on the right 5th toe (lateral IP joint)  Lymph nodes:   Cervical, supraclavicular, and inguinal nodes normal    Neurologic:   Normal strength, sensation and gait; reflexes 2+ and symmetric throughout                   Psych:  Normal mood, affect, hygiene and grooming    ***UPDATE--heart irreg irreg? Vs ectopy Skin Minimal tremor R hand. None noted on  the left, no head tremor. ?   Lab Results  Component Value Date   CHOL 124 03/04/2023   HDL 50 03/04/2023   LDLCALC 60 03/04/2023   TRIG 67 03/04/2023   CHOLHDL 2.5 03/04/2023   Urine microalb/Cr ratio 17  Lab Results  Component Value Date   WBC 7.3 03/04/2023   HGB 13.6 03/04/2023   HCT 40.5 03/04/2023   MCV 97 03/04/2023   PLT 271 03/04/2023      ASSESSMENT/PLAN:  Put paperwork or whatever I am supposed to have documented in this note with his chart so I make sure to include whatever is needed for his walker.  Needs POC A1c--not sure why I didn't put that order in  Flu/COVID shots--enter dates or give if hasn't had Has he gotten RSV?  Rf hytrin , metformin , lisinopril  and simvastatin   Recommended at least 30 minutes of aerobic activity at least 5 days/week; weight bearing exercise at least 2x/week; proper sunscreen use reviewed; healthy diet and alcohol recommendations (less than or equal to 2 drinks/day) reviewed; regular seatbelt use; changing batteries in smoke detectors. Immunization recommendations discussed--UTD; continue yearly high dose flu shots.  Colonoscopy recommendations reviewed, not rec due to age, unless problems/sx develop.   MOST form reviewed and updated.  Full Code, Full Care.   F/u med check 6 mos with labs prior--A1c, cbc, c-met, lipid, TSH ***ENTER FUTURE ORDERS   Medicare Attestation I have personally reviewed: The patient's medical and social history Their use of alcohol, tobacco or illicit drugs Their current medications and supplements The patient's functional ability including ADLs,fall risks, home safety risks, cognitive, and hearing and visual impairment Diet and physical activities Evidence for depression or mood disorders  The patient's weight, height, BMI have been recorded in the chart.  I have made referrals, counseling, and provided education to the patient based on review of the above and I have provided the patient with a  written personalized care plan for preventive services.

## 2023-03-09 ENCOUNTER — Ambulatory Visit: Payer: Medicare Other | Admitting: Family Medicine

## 2023-03-09 ENCOUNTER — Telehealth: Payer: Self-pay | Admitting: *Deleted

## 2023-03-09 NOTE — Telephone Encounter (Signed)
 Andre Walker called and had to r/s his appt for this afternoon as he is having bad diarrhea(no other symptoms). Said it's going around friends home. He had it so bad he went in his bed last night. He is taking Kaopectate. Is there anything else he can take? Would you be able to call something in?

## 2023-03-09 NOTE — Telephone Encounter (Signed)
 Spoke with patient. Did offer him an appointment. He said he would prefer to wait.

## 2023-03-09 NOTE — Telephone Encounter (Signed)
 I cannot call anything in. Happy to do a virtual/telephone visit if he wants.  He should avoid dairy, drink plenty of fluids (water, gatorade), eat a BRAT diet, and can use imodium as needed (OTC)

## 2023-03-11 NOTE — Patient Instructions (Addendum)
  HEALTH MAINTENANCE RECOMMENDATIONS:  It is recommended that you get at least 30 minutes of aerobic exercise at least 5 days/week (for weight loss, you may need as much as 60-90 minutes). This can be any activity that gets your heart rate up. This can be divided in 10-15 minute intervals if needed, but try and build up your endurance at least once a week.  Weight bearing exercise is also recommended twice weekly.  Eat a healthy diet with lots of vegetables, fruits and fiber.  "Colorful" foods have a lot of vitamins (ie green vegetables, tomatoes, red peppers, etc).  Limit sweet tea, regular sodas and alcoholic beverages, all of which has a lot of calories and sugar.  Up to 2 alcoholic drinks daily may be beneficial for men (unless trying to lose weight, watch sugars).  Drink a lot of water.  Sunscreen of at least SPF 30 should be used on all sun-exposed parts of the skin when outside between the hours of 10 am and 4 pm (not just when at beach or pool, but even with exercise, golf, tennis, and yard work!)  Use a sunscreen that says "broad spectrum" so it covers both UVA and UVB rays, and make sure to reapply every 1-2 hours.  Remember to change the batteries in your smoke detectors when changing your clock times in the spring and fall.  Carbon monoxide detectors are recommended for your home.  Use your seat belt every time you are in a car, and please drive safely and not be distracted with cell phones and texting while driving.    Andre Walker , Thank you for taking time to come for your Medicare Wellness Visit. I appreciate your ongoing commitment to your health goals. Please review the following plan we discussed and let me know if I can assist you in the future.   This is a list of the screening recommended for you and due dates:  Health Maintenance  Topic Date Due   COVID-19 Vaccine (8 - 2024-25 season) 02/20/2023   Complete foot exam   08/06/2023   Hemoglobin A1C  09/09/2023   Eye exam for  diabetics  10/22/2023   Medicare Annual Wellness Visit  03/11/2024   DTaP/Tdap/Td vaccine (4 - Td or Tdap) 01/31/2031   Pneumonia Vaccine  Completed   Flu Shot  Completed   Zoster (Shingles) Vaccine  Completed   HPV Vaccine  Aged Out   Ignore that it says you need another COVID booster--you are up to date on all your vaccines.  Keep up the good work!

## 2023-03-11 NOTE — Progress Notes (Signed)
Chief Complaint  Patient presents with   Medicare Wellness    Nonfasting AWV. Patient has covid booster, gave me date and I abstracted,. Having balance issues, brought for face to face visit for DME (walker with seat and wheels).    Andre Walker is a 89 y.o. male who presents for annual wellness visit and follow-up on chronic medical conditions.  He had labs done prior to his visit (see below).  He had vomiting and diarrhea on 1/11, diarrhea persisted through 1/12.  This has resolved, is back to a normal diet.  Wife had a mild course as well.  There is a stomach bug going through [facility].  He has been having more trouble getting around, issues with balance and walking.  He had asked for a rolling walker with a seat for help walking to the dining room. He has been able to use a loaner from his community, but would like his own. He uses a cart when at the grocery store, as that helps better than a cane.  Doesn't feel as steady with a cane, when walking, the walker provides more stability.  He hasn't needed to sit down to rest, but likes the seat as an option. This is face-to-face visit as required for insurance. He started PT 2 weeks ago to help with gait and balance.    At his visit in June he noted a tremor in the R hand, handwriting was worse. He notes that this has improved.  Diabetes: He reports compliance with Metformin 1500mg  daily, and denies any side effects. Last A1c was 6.4% in 07/2022 on this regimen. Sugars are 88-135 (higher after pasta, usually <115).  He denies any sores or lesions on his feet, denies tingling.  He has someone that does pedicures (doesn't see podiatrist). He reports very little sweets (1 or 2 desserts a week). He tries to limit serving sizes of bread, pasta, rice.  He admits to some more sweets over the holidays than usual. Denies hypoglycemia, polydipsia and polyuria.  Last eye exam was in 09/2022. He has h/o microalbuminuria, and continues on  lisinopril without side effects. Denies dizziness or cough.   Hyperlipidemia follow-up: Patient is reportedly following a low-fat, low cholesterol diet. Compliant with medications (simvastatin) and denies medication side effects.     Atrial fibrillation -- Noted after cholecystectomy in 2020, has persisted and is asymptomatic.  He last saw Dr. Swaziland in 01/2023.  No changes were made. He continues on anticoagulants. He has not required any rate-controlling medications.  He has some easy bruising, but denies any bleeding. He denies chest pain, palpitations, shortness of breath.   BPH:  Under the care of [facility], last seen in 07/2022.  He continues on Hytrin, and trospium nightly for OAB. His nocturia had decreased from 3-4x/night to 2x/night (at most up 3x to void). He has mild dry mouth, tolerable. He feels like his stream is good, empties bladder well. Denies dysuria, hematuria.   Subclinical hypothyroidism: He has had some borderline and elevated TSH values in the past, never symptomatic. Negative TPO in 01/2022.  Most recent TSH was lower than it had been, at 4.510. He denies fatigue, cold intolerance, hair/skin/nail changes.  No weight change or mood changes.    Component Ref Range & Units (hover) 7 mo ago (08/06/22) 1 yr ago (01/29/22) 1 yr ago (07/31/21) 2 yr ago (01/28/21) 2 yr ago (08/13/20) 2 yr ago (07/09/20) 3 yr ago (01/11/20)  TSH 4.510 High  8.650 High  7.330 High  7.920 High  4.750 High  6.500 High  7.690 High      Immunization History  Administered Date(s) Administered   Fluad Quad(high Dose 65+) 12/22/2018, 11/17/2019, 12/11/2020, 11/24/2021   Influenza Split 11/25/2007, 10/26/2008, 12/02/2010, 12/03/2011   Influenza, High Dose Seasonal PF 12/14/2009, 12/01/2012, 11/24/2013, 12/12/2013, 12/06/2014, 12/15/2014, 11/13/2015, 11/26/2016, 12/07/2017   Influenza-Unspecified 02/06/2023   Moderna Covid-19 Vaccine Bivalent Booster 27yrs & up 12/11/2020   Moderna SARS-COV2  Booster Vaccination 07/09/2020   Moderna Sars-Covid-2 Vaccination 02/28/2019, 03/28/2019, 01/03/2020   PNEUMOCOCCAL CONJUGATE-20 08/06/2022   Pfizer(Comirnaty)Fall Seasonal Vaccine 12 years and older 01/29/2022, 08/06/2022, 12/26/2022   Pneumococcal Conjugate-13 02/24/2013, 09/12/2013   Pneumococcal Polysaccharide-23 12/25/2004, 06/04/2011, 06/07/2011, 12/03/2016   RSV,unspecified 10/09/2022   Td 04/24/2004   Tdap 12/02/2010, 01/30/2021   Zoster Recombinant(Shingrix) 06/30/2017, 10/11/2017   Zoster, Live 10/25/2007   Last colonoscopy: 12/2015, showing int/external hemorrhoids, hyperplastic polyp and diverticulosis Last PSA: no longer being screened; under care of urologist.  Dentist: twice yearly Ophtho: yearly Exercise: goes to the gym 3x/week-- rowing machine for 10-12 minutes at a time, with a total time of 20 minutes.   Uses the exercise bike with the arms for 25-30 minutes (prior to using rowing machine).  Weights. Too cold to walk outside, walks some in nicer weather. No longer does any yoga. Currently in PT. Golfs once a week (weather-dependent, not recently).  Patient Care Team: Joselyn Arrow, MD as PCP - General (Family Medicine) Dr. Luiz Blare as Consulting Physician (Dentistry) Dr. Swaziland (cardiology) Dr. Liliane Shi (urologist)  Tollie Eth (at [facility]) Dr. Dione Booze (ophtho) --yearly Dr. Ewing Schlein (GI)  [facility] hearing Dr. Daphine Deutscher (general surgeon, did melanoma excision, no regular f/u)   Depression Screening: Flowsheet Row Office Visit from 03/12/2023 in [facility]  PHQ-2 Total Score 0        Falls screen:     03/12/2023    1:31 PM 07/02/2022    9:24 AM 02/03/2022    1:53 PM 01/30/2021    1:38 PM 01/16/2020    9:26 AM  Fall Risk   Falls in the past year? 0 0 0 0 0  Number falls in past yr: 0 0 0 0 0  Injury with Fall? 0 0 0 0 0  Risk for fall due to : No Fall Risks No Fall Risks No Fall Risks No Fall Risks   Follow up Falls evaluation completed Falls  evaluation completed Falls evaluation completed Falls evaluation completed      Functional Status Survey: Is the patient deaf or have difficulty hearing?: Yes (B/L hearing aides) Does the patient have difficulty seeing, even when wearing glasses/contacts?: Yes (uses reading glasses) Does the patient have difficulty concentrating, remembering, or making decisions?: No Does the patient have difficulty walking or climbing stairs?: Yes (balance issues when walking, he said he cannot walk very fast anymore) Does the patient have difficulty dressing or bathing?: No Does the patient have difficulty doing errands alone such as visiting a doctor's office or shopping?: No  Mini-Cog Scoring: 5   End of Life Discussion:  Patient has a living will and medical power of attorney, scanned in chart.   PMH, PSH, SH and FH were reviewed and updated  Outpatient Encounter Medications as of 03/12/2023  Medication Sig Note   apixaban (ELIQUIS) 5 MG TABS tablet Take 1 tablet (5 mg total) by mouth 2 (two) times daily. Take 1 tablet by mouth twice daily    Ascorbic Acid (VITAMIN C PO) Take 2 tablets  by mouth daily. 03/12/2023: Thinks dose is 250 each   Calcium Carbonate-Vitamin D 600-400 MG-UNIT tablet Take 1 tablet by mouth 2 (two) times daily.    Coenzyme Q10 (COQ10) 100 MG CAPS Take 100 mg by mouth daily at 12 noon. 03/12/2023: Taking 200mg  daily   fluorouracil (EFUDEX) 5 % cream  03/12/2023: Using on left arm currently   Glucosamine-Chondroitin (COSAMIN DS PO) Take 1 tablet by mouth 2 (two) times daily. With lunch & supper    glucose blood (ONETOUCH VERIO) test strip USE 1 STRIP TO CHECK GLUCOSE ONCE DAILY DX:E11.69    Lancets (ONETOUCH DELICA PLUS LANCET33G) MISC Patient to check blood sugar once daily(usually in the morning).    lisinopril (ZESTRIL) 5 MG tablet Take 1 tablet by mouth once daily    loratadine (CLARITIN) 10 MG tablet Take 10 mg by mouth daily.    metFORMIN (GLUCOPHAGE-XR) 500 MG 24 hr tablet  TAKE 3 TABLETS BY MOUTH ONCE DAILY WITH BREAKFAST    Multiple Vitamins-Minerals (MULTIVITAMIN WITH MINERALS) tablet Take 1 tablet by mouth daily.    Omega-3 Fatty Acids (FISH OIL PO) Take 1,600 mg by mouth daily with supper.     simvastatin (ZOCOR) 20 MG tablet TAKE 1 TABLET BY MOUTH AT BEDTIME    terazosin (HYTRIN) 5 MG capsule Take 1 capsule by mouth at bedtime    trospium (SANCTURA) 20 MG tablet Take 20 mg by mouth at bedtime.    acetaminophen (TYLENOL) 325 MG tablet Take 650 mg by mouth every 6 (six) hours as needed for mild pain (pain score 1-3), moderate pain (pain score 4-6) or headache. (Patient not taking: Reported on 03/12/2023) 03/12/2023: As needed   hydrocortisone cream 1 % Apply 1 Application topically 2 (two) times daily. (Patient not taking: Reported on 03/12/2023) 03/12/2023: As needed   No facility-administered encounter medications on file as of 03/12/2023.   Allergies  Allergen Reactions   Penicillins Rash    Has patient had a PCN reaction causing immediate rash, facial/tongue/throat swelling, SOB or lightheadedness with hypotension: No Has patient had a PCN reaction causing severe rash involving mucus membranes or skin necrosis: No Has patient had a PCN reaction that required hospitalization: No Has patient had a PCN reaction occurring within the last 10 years: No If all of the above answers are "NO", then may proceed with Cephalosporin use.      ROS: The patient denies anorexia, fever, headaches, vision loss, ear pain, hoarseness, chest pain, palpitations, dizziness, syncope, dyspnea on exertion, cough, nausea, vomiting, diarrhea, constipation, abdominal pain, melena, hematochezia, indigestion/heartburn, hematuria, incontinence, weakened urine stream, dysuria, genital lesions, numbness, tingling, weakness, tremor, rashes, depression, anxiety, abnormal bleeding (some easy bruising), or enlarged lymph nodes.    Rare heartburn, about once a month (spicy, spaghetti), uses Tums  prn, rarely. 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 Up 2-3x/night to void.  Rare slight leakage (urge incontinence) He isn't as steady when he first stands up or with walking, getting PT, using walker. Leg fatigue after 30 minutes of walking, per HPI. Sees derm regularly Recent GI illness, per HPI, resolved. Tremor in R hand is less.   PHYSICAL EXAM:  BP 120/70   Pulse 68   Ht 5\' 9"  (1.753 m)   Wt 188 lb (85.3 kg)   BMI 27.76 kg/m   Wt Readings from Last 3 Encounters:  03/12/23 188 lb (85.3 kg)  02/19/23 194 lb (88 kg)  08/06/22 197 lb 9.6 oz (89.6 kg)    General  Appearance:   Alert, cooperative, no distress, appears stated age    Head:   Normocephalic, without obvious abnormality, atraumatic    Eyes:   PERRL, conjunctiva/corneas clear, EOM's intact, fundi normal  Ears:   Wearing hearing aids bilaterally.   Nose:   No drainage or sinus tenderness  Throat:   Normal mucosa  Neck:   Supple, no lymphadenopathy; thyroid: no enlargement/ tenderness/nodules; no carotid bruit or JVD    Back:   Spine nontender, no curvature, ROM normal, no CVA tenderness. Scoliosis and prominence/kyphosis of the lower lumbar spine. nontender.  Lungs:   Clear to auscultation bilaterally without wheezes, rales or ronchi; respirations unlabored    Chest Wall:   No tenderness or deformity    Heart:   Rate controlled, fairly regular overall, some delays/irregularly irregular, but subtle; S1 and S2 normal, no murmur, rub or gallop.   Breast Exam:   No chest wall tenderness, masses or gynecomastia    Abdomen:   Soft, non-tender, nondistended, normoactive bowel sounds, no masses, no hepatosplenomegaly. 2.5-3cm soft, easily reducible umbilical hernia  Genitalia:   Deferred to urologist (per pt request, has f/u scheduled)  Rectal:   Deferred to urologist  Extremities:   No clubbing, cyanosis Trace edema at ankles only  Pulses:   2+ and symmetric all extremities    Skin:    Skin color, texture, turgor normal, no rashes. Senile purpura on lR arm. Actinic damage and AK's on arms/legs, some of which are scabbed from treatments. Onychomycotic nails (all on R except the 4th toe, and all on the L except the 3rd and 4th toenails).  Callous on the right 5th toe (lateral IP joint)  Lymph nodes:   Cervical, supraclavicular, and inguinal nodes normal    Neurologic:   Normal strength, sensation and gait; reflexes 2+ and symmetric throughout. Minimal tremor on R hand, no intention tremor.                 Psych:  Normal mood, affect, hygiene and grooming    Lab Results  Component Value Date   CHOL 124 03/04/2023   HDL 50 03/04/2023   LDLCALC 60 03/04/2023   TRIG 67 03/04/2023   CHOLHDL 2.5 03/04/2023   Urine microalb/Cr ratio 17  Lab Results  Component Value Date   WBC 7.3 03/04/2023   HGB 13.6 03/04/2023   HCT 40.5 03/04/2023   MCV 97 03/04/2023   PLT 271 03/04/2023   Lab Results  Component Value Date   HGBA1C 6.8 (A) 03/12/2023      ASSESSMENT/PLAN:   Medicare annual wellness visit, subsequent  Diabetes mellitus with microalbuminuria (HCC) Assessment & Plan: microalbuminuria is controlled/resolved with lisinopril. DM is controlled   Orders: -     POCT glycosylated hemoglobin (Hb A1C) -     Lisinopril; Take 1 tablet (5 mg total) by mouth daily.  Dispense: 90 tablet; Refill: 3 -     metFORMIN HCl ER; TAKE 3 TABLETS BY MOUTH ONCE DAILY WITH BREAKFAST  Dispense: 270 tablet; Refill: 1 -     Hemoglobin A1c; Future -     TSH; Future  Chronic atrial fibrillation (HCC) Assessment & Plan: Rate controlled, anticoagulated.  UTD with seeing cardiologist  Orders: -     TSH; Future  Subclinical hypothyroidism Assessment & Plan: Asymptomatic.  Last check was improved at 4.5 range. Will recheck (yearly)  Orders: -     TSH; Future  Benign prostatic hyperplasia, unspecified whether lower urinary tract symptoms present Assessment & Plan:  Doing well on  current regimen per urology  Orders: -     Terazosin HCl; Take 1 capsule (5 mg total) by mouth at bedtime.  Dispense: 90 capsule; Refill: 3  Anticoagulant long-term use -     CBC with Differential/Platelet; Future  Acquired thrombophilia (HCC) Assessment & Plan: Related to atrial fibrillation, on anticoagulants   Diabetes mellitus with microalbuminuria (HCC) Assessment & Plan: microalbuminuria is controlled/resolved with lisinopril. DM is controlled   Orders: -     POCT glycosylated hemoglobin (Hb A1C) -     Lisinopril; Take 1 tablet (5 mg total) by mouth daily.  Dispense: 90 tablet; Refill: 3 -     metFORMIN HCl ER; TAKE 3 TABLETS BY MOUTH ONCE DAILY WITH BREAKFAST  Dispense: 270 tablet; Refill: 1 -     Hemoglobin A1c; Future -     TSH; Future  Pure hypercholesterolemia Assessment & Plan: At goal, continue simvastatin  Orders: -     Simvastatin; Take 1 tablet (20 mg total) by mouth at bedtime.  Dispense: 90 tablet; Refill: 3  Medication monitoring encounter -     Hemoglobin A1c; Future -     CBC with Differential/Platelet; Future -     Comprehensive metabolic panel; Future    Recommended at least 30 minutes of aerobic activity at least 5 days/week; weight bearing exercise at least 2x/week; proper sunscreen use reviewed; healthy diet and alcohol recommendations (less than or equal to 2 drinks/day) reviewed; regular seatbelt use; changing batteries in smoke detectors. Immunization recommendations discussed--UTD; continue yearly high dose flu shots.  Colonoscopy recommendations reviewed, not rec due to age, unless problems/sx develop.   MOST form reviewed and updated.  Full Code, Full Care.   F/u med check 6 mos with labs prior--A1c, cbc, c-met, TSH   Medicare Attestation I have personally reviewed: The patient's medical and social history Their use of alcohol, tobacco or illicit drugs Their current medications and supplements The patient's functional ability  including ADLs,fall risks, home safety risks, cognitive, and hearing and visual impairment Diet and physical activities Evidence for depression or mood disorders  The patient's weight, height, BMI have been recorded in the chart.  I have made referrals, counseling, and provided education to the patient based on review of the above and I have provided the patient with a written personalized care plan for preventive services.

## 2023-03-12 ENCOUNTER — Ambulatory Visit: Payer: Medicare Other | Admitting: Family Medicine

## 2023-03-12 ENCOUNTER — Encounter: Payer: Self-pay | Admitting: Family Medicine

## 2023-03-12 VITALS — BP 120/70 | HR 68 | Ht 69.0 in | Wt 188.0 lb

## 2023-03-12 DIAGNOSIS — I482 Chronic atrial fibrillation, unspecified: Secondary | ICD-10-CM

## 2023-03-12 DIAGNOSIS — R809 Proteinuria, unspecified: Secondary | ICD-10-CM | POA: Diagnosis not present

## 2023-03-12 DIAGNOSIS — E1129 Type 2 diabetes mellitus with other diabetic kidney complication: Secondary | ICD-10-CM | POA: Diagnosis not present

## 2023-03-12 DIAGNOSIS — Z5181 Encounter for therapeutic drug level monitoring: Secondary | ICD-10-CM

## 2023-03-12 DIAGNOSIS — E038 Other specified hypothyroidism: Secondary | ICD-10-CM | POA: Diagnosis not present

## 2023-03-12 DIAGNOSIS — D6869 Other thrombophilia: Secondary | ICD-10-CM

## 2023-03-12 DIAGNOSIS — Z23 Encounter for immunization: Secondary | ICD-10-CM

## 2023-03-12 DIAGNOSIS — Z7901 Long term (current) use of anticoagulants: Secondary | ICD-10-CM

## 2023-03-12 DIAGNOSIS — N400 Enlarged prostate without lower urinary tract symptoms: Secondary | ICD-10-CM

## 2023-03-12 DIAGNOSIS — Z0000 Encounter for general adult medical examination without abnormal findings: Secondary | ICD-10-CM | POA: Diagnosis not present

## 2023-03-12 DIAGNOSIS — E7800 Pure hypercholesterolemia, unspecified: Secondary | ICD-10-CM

## 2023-03-12 LAB — POCT GLYCOSYLATED HEMOGLOBIN (HGB A1C): Hemoglobin A1C: 6.8 % — AB (ref 4.0–5.6)

## 2023-03-12 MED ORDER — METFORMIN HCL ER 500 MG PO TB24: ORAL_TABLET | ORAL | 1 refills | Status: DC

## 2023-03-12 MED ORDER — LISINOPRIL 5 MG PO TABS: 5.0000 mg | ORAL_TABLET | Freq: Every day | ORAL | 3 refills | Status: DC

## 2023-03-12 MED ORDER — TERAZOSIN HCL 5 MG PO CAPS: 5.0000 mg | ORAL_CAPSULE | Freq: Every day | ORAL | 3 refills | Status: DC

## 2023-03-12 MED ORDER — SIMVASTATIN 20 MG PO TABS: 20.0000 mg | ORAL_TABLET | Freq: Every day | ORAL | 3 refills | Status: DC

## 2023-03-12 NOTE — Assessment & Plan Note (Signed)
Asymptomatic.  Last check was improved at 4.5 range. Will recheck (yearly)

## 2023-03-12 NOTE — Progress Notes (Signed)
Done

## 2023-03-12 NOTE — Assessment & Plan Note (Signed)
Doing well on current regimen per urology

## 2023-03-12 NOTE — Assessment & Plan Note (Signed)
At goal, continue simvastatin

## 2023-03-12 NOTE — Assessment & Plan Note (Signed)
microalbuminuria is controlled/resolved with lisinopril. DM is controlled

## 2023-03-12 NOTE — Assessment & Plan Note (Signed)
Rate controlled, anticoagulated.  UTD with seeing cardiologist

## 2023-03-12 NOTE — Assessment & Plan Note (Signed)
Related to atrial fibrillation, on anticoagulants

## 2023-04-06 ENCOUNTER — Other Ambulatory Visit: Payer: Self-pay | Admitting: Cardiology

## 2023-04-06 DIAGNOSIS — I4819 Other persistent atrial fibrillation: Secondary | ICD-10-CM

## 2023-04-07 NOTE — Telephone Encounter (Signed)
Prescription refill request for Eliquis received. Indication:afib Last office visit:12/24 Scr:1.05  6/24 Age: 89 Weight:85.3  kg  Prescription refilled

## 2023-04-22 ENCOUNTER — Emergency Department (HOSPITAL_COMMUNITY): Payer: Medicare Other

## 2023-04-22 ENCOUNTER — Encounter (HOSPITAL_COMMUNITY): Payer: Self-pay | Admitting: Internal Medicine

## 2023-04-22 ENCOUNTER — Observation Stay (HOSPITAL_COMMUNITY): Payer: Medicare Other

## 2023-04-22 ENCOUNTER — Observation Stay (HOSPITAL_BASED_OUTPATIENT_CLINIC_OR_DEPARTMENT_OTHER): Payer: Medicare Other

## 2023-04-22 ENCOUNTER — Other Ambulatory Visit: Payer: Self-pay

## 2023-04-22 ENCOUNTER — Observation Stay (HOSPITAL_COMMUNITY)
Admission: EM | Admit: 2023-04-22 | Discharge: 2023-04-23 | Disposition: A | Discharge status: Home / Self Care | Payer: Medicare Other | Attending: Internal Medicine | Admitting: Internal Medicine

## 2023-04-22 DIAGNOSIS — N400 Enlarged prostate without lower urinary tract symptoms: Secondary | ICD-10-CM | POA: Insufficient documentation

## 2023-04-22 DIAGNOSIS — Z955 Presence of coronary angioplasty implant and graft: Secondary | ICD-10-CM | POA: Insufficient documentation

## 2023-04-22 DIAGNOSIS — E1169 Type 2 diabetes mellitus with other specified complication: Secondary | ICD-10-CM

## 2023-04-22 DIAGNOSIS — Z7901 Long term (current) use of anticoagulants: Secondary | ICD-10-CM | POA: Diagnosis not present

## 2023-04-22 DIAGNOSIS — Z96653 Presence of artificial knee joint, bilateral: Secondary | ICD-10-CM | POA: Diagnosis not present

## 2023-04-22 DIAGNOSIS — E119 Type 2 diabetes mellitus without complications: Secondary | ICD-10-CM | POA: Diagnosis not present

## 2023-04-22 DIAGNOSIS — R0602 Shortness of breath: Secondary | ICD-10-CM | POA: Diagnosis not present

## 2023-04-22 DIAGNOSIS — Z79899 Other long term (current) drug therapy: Secondary | ICD-10-CM | POA: Diagnosis not present

## 2023-04-22 DIAGNOSIS — I10 Essential (primary) hypertension: Secondary | ICD-10-CM | POA: Insufficient documentation

## 2023-04-22 DIAGNOSIS — Z1152 Encounter for screening for COVID-19: Secondary | ICD-10-CM | POA: Diagnosis not present

## 2023-04-22 DIAGNOSIS — R55 Syncope and collapse: Secondary | ICD-10-CM | POA: Diagnosis present

## 2023-04-22 DIAGNOSIS — R569 Unspecified convulsions: Secondary | ICD-10-CM

## 2023-04-22 DIAGNOSIS — E785 Hyperlipidemia, unspecified: Secondary | ICD-10-CM | POA: Insufficient documentation

## 2023-04-22 DIAGNOSIS — I482 Chronic atrial fibrillation, unspecified: Secondary | ICD-10-CM | POA: Diagnosis not present

## 2023-04-22 DIAGNOSIS — R4189 Other symptoms and signs involving cognitive functions and awareness: Secondary | ICD-10-CM

## 2023-04-22 DIAGNOSIS — Z7984 Long term (current) use of oral hypoglycemic drugs: Secondary | ICD-10-CM | POA: Diagnosis not present

## 2023-04-22 DIAGNOSIS — R2681 Unsteadiness on feet: Secondary | ICD-10-CM | POA: Diagnosis not present

## 2023-04-22 DIAGNOSIS — E876 Hypokalemia: Secondary | ICD-10-CM | POA: Diagnosis not present

## 2023-04-22 LAB — ECHOCARDIOGRAM COMPLETE
AR max vel: 2.66 cm2
AV Area VTI: 2.77 cm2
AV Area mean vel: 2.58 cm2
AV Mean grad: 3.3 mmHg
AV Peak grad: 6.5 mmHg
Ao pk vel: 1.27 m/s
Area-P 1/2: 3.66 cm2
Calc EF: 46.6 %
MV M vel: 4.89 m/s
MV Peak grad: 95.5 mmHg
P 1/2 time: 801 ms
S' Lateral: 4.8 cm
Single Plane A2C EF: 45.4 %
Single Plane A4C EF: 46.1 %
Weight: 3097.02 [oz_av]

## 2023-04-22 LAB — RESP PANEL BY RT-PCR (RSV, FLU A&B, COVID)  RVPGX2
Influenza A by PCR: NEGATIVE
Influenza B by PCR: NEGATIVE
Resp Syncytial Virus by PCR: NEGATIVE
SARS Coronavirus 2 by RT PCR: NEGATIVE

## 2023-04-22 LAB — TROPONIN I (HIGH SENSITIVITY)
Troponin I (High Sensitivity): 18 ng/L — ABNORMAL HIGH (ref <18)
Troponin I (High Sensitivity): 71 ng/L — ABNORMAL HIGH (ref <18)

## 2023-04-22 LAB — CBC
HCT: 35.5 % — ABNORMAL LOW (ref 39.0–52.0)
Hemoglobin: 12 g/dL — ABNORMAL LOW (ref 13.0–17.0)
MCH: 32.7 pg (ref 26.0–34.0)
MCHC: 33.8 g/dL (ref 30.0–36.0)
MCV: 96.7 fL (ref 80.0–100.0)
Platelets: 199 10*3/uL (ref 150–400)
RBC: 3.67 MIL/uL — ABNORMAL LOW (ref 4.22–5.81)
RDW: 12.7 % (ref 11.5–15.5)
WBC: 12 10*3/uL — ABNORMAL HIGH (ref 4.0–10.5)
nRBC: 0 % (ref 0.0–0.2)

## 2023-04-22 LAB — I-STAT CHEM 8, ED
BUN: 19 mg/dL (ref 8–23)
Calcium, Ion: 1.09 mmol/L — ABNORMAL LOW (ref 1.15–1.40)
Chloride: 107 mmol/L (ref 98–111)
Creatinine, Ser: 1 mg/dL (ref 0.61–1.24)
Glucose, Bld: 217 mg/dL — ABNORMAL HIGH (ref 70–99)
HCT: 34 % — ABNORMAL LOW (ref 39.0–52.0)
Hemoglobin: 11.6 g/dL — ABNORMAL LOW (ref 13.0–17.0)
Potassium: 3.2 mmol/L — ABNORMAL LOW (ref 3.5–5.1)
Sodium: 141 mmol/L (ref 135–145)
TCO2: 18 mmol/L — ABNORMAL LOW (ref 22–32)

## 2023-04-22 LAB — DIFFERENTIAL
Abs Immature Granulocytes: 0.07 10*3/uL (ref 0.00–0.07)
Basophils Absolute: 0 10*3/uL (ref 0.0–0.1)
Basophils Relative: 0 %
Eosinophils Absolute: 0.2 10*3/uL (ref 0.0–0.5)
Eosinophils Relative: 2 %
Immature Granulocytes: 1 %
Lymphocytes Relative: 21 %
Lymphs Abs: 2.5 10*3/uL (ref 0.7–4.0)
Monocytes Absolute: 0.5 10*3/uL (ref 0.1–1.0)
Monocytes Relative: 4 %
Neutro Abs: 8.7 10*3/uL — ABNORMAL HIGH (ref 1.7–7.7)
Neutrophils Relative %: 72 %

## 2023-04-22 LAB — URINALYSIS, ROUTINE W REFLEX MICROSCOPIC
Bilirubin Urine: NEGATIVE
Glucose, UA: 150 mg/dL — AB
Hgb urine dipstick: NEGATIVE
Ketones, ur: 5 mg/dL — AB
Leukocytes,Ua: NEGATIVE
Nitrite: NEGATIVE
Protein, ur: NEGATIVE mg/dL
Specific Gravity, Urine: 1.015 (ref 1.005–1.030)
pH: 5 (ref 5.0–8.0)

## 2023-04-22 LAB — COMPREHENSIVE METABOLIC PANEL
ALT: 15 U/L (ref 0–44)
AST: 19 U/L (ref 15–41)
Albumin: 3.2 g/dL — ABNORMAL LOW (ref 3.5–5.0)
Alkaline Phosphatase: 57 U/L (ref 38–126)
Anion gap: 8 (ref 5–15)
BUN: 18 mg/dL (ref 8–23)
CO2: 17 mmol/L — ABNORMAL LOW (ref 22–32)
Calcium: 7.9 mg/dL — ABNORMAL LOW (ref 8.9–10.3)
Chloride: 112 mmol/L — ABNORMAL HIGH (ref 98–111)
Creatinine, Ser: 1 mg/dL (ref 0.61–1.24)
GFR, Estimated: >60 mL/min (ref >60)
Glucose, Bld: 222 mg/dL — ABNORMAL HIGH (ref 70–99)
Potassium: 3 mmol/L — ABNORMAL LOW (ref 3.5–5.1)
Sodium: 137 mmol/L (ref 135–145)
Total Bilirubin: 0.6 mg/dL (ref 0.0–1.2)
Total Protein: 5.4 g/dL — ABNORMAL LOW (ref 6.5–8.1)

## 2023-04-22 LAB — GLUCOSE, CAPILLARY: Glucose-Capillary: 169 mg/dL — ABNORMAL HIGH (ref 70–99)

## 2023-04-22 LAB — CBG MONITORING, ED: Glucose-Capillary: 236 mg/dL — ABNORMAL HIGH (ref 70–99)

## 2023-04-22 LAB — MAGNESIUM: Magnesium: 0.9 mg/dL — CL (ref 1.7–2.4)

## 2023-04-22 LAB — ETHANOL: Alcohol, Ethyl (B): <10 mg/dL (ref <10)

## 2023-04-22 LAB — PROTIME-INR
INR: 1.2 (ref 0.8–1.2)
Prothrombin Time: 15.6 s — ABNORMAL HIGH (ref 11.4–15.2)

## 2023-04-22 LAB — APTT: aPTT: 24 s (ref 24–36)

## 2023-04-22 LAB — CK: Total CK: 97 U/L (ref 49–397)

## 2023-04-22 LAB — BRAIN NATRIURETIC PEPTIDE: B Natriuretic Peptide: 211.6 pg/mL — ABNORMAL HIGH (ref 0.0–100.0)

## 2023-04-22 MED ORDER — SIMVASTATIN 20 MG PO TABS
20.0000 mg | ORAL_TABLET | Freq: Every day | ORAL | Status: DC
  Administered 2023-04-22: 20 mg via ORAL
  Filled 2023-04-22: qty 1

## 2023-04-22 MED ORDER — TERAZOSIN HCL 5 MG PO CAPS
5.0000 mg | ORAL_CAPSULE | Freq: Every day | ORAL | Status: DC
  Administered 2023-04-22: 5 mg via ORAL
  Filled 2023-04-22 (×2): qty 1

## 2023-04-22 MED ORDER — STROKE: EARLY STAGES OF RECOVERY BOOK: Freq: Once | Status: DC

## 2023-04-22 MED ORDER — INSULIN ASPART 100 UNIT/ML IJ SOLN: 0.0000 [IU] | Freq: Every day | INTRAMUSCULAR | Status: DC

## 2023-04-22 MED ORDER — INSULIN ASPART 100 UNIT/ML IJ SOLN: 0.0000 [IU] | Freq: Three times a day (TID) | INTRAMUSCULAR | Status: DC

## 2023-04-22 MED ORDER — FESOTERODINE FUMARATE ER 4 MG PO TB24
4.0000 mg | ORAL_TABLET | Freq: Every day | ORAL | Status: DC
  Administered 2023-04-22 – 2023-04-23 (×2): 4 mg via ORAL
  Filled 2023-04-22 (×2): qty 1

## 2023-04-22 MED ORDER — ACETAMINOPHEN 160 MG/5ML PO SOLN: 650.0000 mg | ORAL | Status: DC | PRN

## 2023-04-22 MED ORDER — MAGNESIUM SULFATE 2 GM/50ML IV SOLN
2.0000 g | Freq: Once | INTRAVENOUS | Status: AC
  Administered 2023-04-22: 2 g via INTRAVENOUS
  Filled 2023-04-22: qty 50

## 2023-04-22 MED ORDER — SODIUM CHLORIDE 0.9 % IV SOLN: INTRAVENOUS | Status: AC

## 2023-04-22 MED ORDER — PERFLUTREN LIPID MICROSPHERE
1.0000 mL | INTRAVENOUS | Status: AC | PRN
  Administered 2023-04-22: 3 mL via INTRAVENOUS

## 2023-04-22 MED ORDER — POTASSIUM CHLORIDE 10 MEQ/100ML IV SOLN
10.0000 meq | Freq: Once | INTRAVENOUS | Status: AC
  Administered 2023-04-22: 10 meq via INTRAVENOUS
  Filled 2023-04-22: qty 100

## 2023-04-22 MED ORDER — POTASSIUM CHLORIDE CRYS ER 20 MEQ PO TBCR
40.0000 meq | EXTENDED_RELEASE_TABLET | Freq: Once | ORAL | Status: AC
  Administered 2023-04-22: 40 meq via ORAL
  Filled 2023-04-22: qty 2

## 2023-04-22 MED ORDER — ACETAMINOPHEN 325 MG PO TABS: 650.0000 mg | ORAL_TABLET | ORAL | Status: DC | PRN

## 2023-04-22 MED ORDER — APIXABAN 5 MG PO TABS
5.0000 mg | ORAL_TABLET | Freq: Two times a day (BID) | ORAL | Status: DC
  Administered 2023-04-22 – 2023-04-23 (×3): 5 mg via ORAL
  Filled 2023-04-22 (×3): qty 1

## 2023-04-22 MED ORDER — ACETAMINOPHEN 650 MG RE SUPP: 650.0000 mg | RECTAL | Status: DC | PRN

## 2023-04-22 MED ORDER — SENNOSIDES-DOCUSATE SODIUM 8.6-50 MG PO TABS: 1.0000 | ORAL_TABLET | Freq: Two times a day (BID) | ORAL | Status: DC | PRN

## 2023-04-22 MED ORDER — SODIUM CHLORIDE 0.9% FLUSH
3.0000 mL | Freq: Once | INTRAVENOUS | Status: AC
  Administered 2023-04-22: 3 mL via INTRAVENOUS

## 2023-04-22 NOTE — Code Documentation (Signed)
 Responded to Code Stroke called at 0530 for L gaze, L facial droop, and aphasia, [id number]. Pt arrived at 0540 with improvement in symptoms.  CBG-236, NIH-1 for LOC questions, CT head negative for acute changes. TNK not given-no focal deficits, on eliquis. Plan workup for syncopy. Please complete VS/neuro checks q2h x 12, then q4h.

## 2023-04-22 NOTE — Consult Note (Signed)
 NEUROLOGY CONSULT NOTE   Date of service: April 22, 2023 Patient Name: Andre Walker MRN:  [id number] DOB:  Aug 04, 1932 Chief Complaint: "code stroke" Requesting Provider: No att. providers found  History of Present Illness  Andre Walker is a 89 y.o. male with hx of afibb on  eliquis, HLD, erectile dysfunction, DM2, hard of hearing, diverticulosis who presents after he was found wedged between the wall and the commode.  Patient has very little recollection of the events.  He remembers going to the bathroom but nothing afterwards.  I spoke with his wife Ms. Andre Walker who reports that she heard a noise which woke her up and she thought she heard someone in the hall.  She noticed the patient was not in the bed.  She found him in the bathroom stuck between the commode and the wall.  His eyes were closed and patient was unresponsive.  He was bleeding from his elbow. He was laying there so she called the nurses in a retirement community come help her out.  The nurses tried to get him up and patient appeared to to assist with that.  He seemed confused but was waking up more.  By the time EMS arrived, he was starting to slowly come around.  EMS brought him in as a code stroke because he was confused.  In route, patient continued to improve and he was pretty much back to himself before arrival. He was in afibb per EMS and having PVCs on their rhythm strip.  Wife reports that patient went to bed around 2230. He was normal and at his baseline. He is off balance at baseline. Went to PT yesterday too and seemed fine.  It is pretty normal for him to get up in the middle of the night and use the bathroom.  He has been eating and drinking fine over the last few days. No hx of stroke. No hx of seizures.  LKW: 2230 Modified rankin score: 2-Slight disability-UNABLE to perform all activities but does not need assistance IV Thrombolysis: not offered, low suspicion for stroke. EVT: not offered, low suspicion  for stroke.  NIHSS components Score: Comment  1a Level of Conscious 0[x]  1[]  2[]  3[]      1b LOC Questions 0[]  1[x]  2[]       1c LOC Commands 0[x]  1[]  2[]       2 Best Gaze 0[x]  1[]  2[]       3 Visual 0[x]  1[]  2[]  3[]      4 Facial Palsy 0[x]  1[]  2[]  3[]      5a Motor Arm - left 0[x]  1[]  2[]  3[]  4[]  UN[]    5b Motor Arm - Right 0[x]  1[]  2[]  3[]  4[]  UN[]    6a Motor Leg - Left 0[x]  1[]  2[]  3[]  4[]  UN[]    6b Motor Leg - Right 0[x]  1[]  2[]  3[]  4[]  UN[]    7 Limb Ataxia 0[x]  1[]  2[]  3[]  UN[]     8 Sensory 0[x]  1[]  2[]  UN[]      9 Best Language 0[x]  1[]  2[]  3[]      10 Dysarthria 0[x]  1[]  2[]  UN[]      11 Extinct. and Inattention 0[x]  1[]  2[]       TOTAL: 1      ROS  Comprehensive ROS performed and pertinent positives documented in HPI   Past History   Past Medical History:  Diagnosis Date   Actinic keratosis    Dr. Terri Piedra   Atrial fibrillation [facility])    BPH (benign prostatic hyperplasia)    controlled on Hytrin  Colon polyps    Dr. Ewing Schlein   Diabetes mellitus 2004   Diverticulosis    ED (erectile dysfunction)    Gallstones    Hearing loss    high frequency   Hiatal hernia    small, noted on EGD 10/2010   LAE (left atrial enlargement) 01/2019   Moderate, Noted on ECHO   Melanoma (HCC)    right arm   Microalbuminuria    on ACEI for microalbuminuria (does NOT have HTN)   Pneumonia 01/2019   Bilateral, noted on CT   Pure hypercholesterolemia 2006   Traumatic partial tear of biceps tendon 01/2009   "popeye" injury on right   Wears glasses    Wears hearing aid in both ears    Wears partial dentures    lower   Wears partial dentures    lower    Past Surgical History:  Procedure Laterality Date   APPENDECTOMY  1955   BILIARY STENT PLACEMENT N/A 01/28/2019   Procedure: BILIARY STENT PLACEMENT;  Surgeon: Willis Modena, MD;  Location: [facility];  Service: Endoscopy;  Laterality: N/A;   CATARACT EXTRACTION Bilateral R 05/07/15, L 05/21/15   Dr. Dione Booze   CHOLECYSTECTOMY N/A  01/27/2019   Procedure: LAPAROSCOPIC CHOLECYSTECTOMY WITH INTRAOPEREATIVE CHOLANGIOGRAM;  Surgeon: Ovidio Kin, MD;  Location: [facility];  Service: General;  Laterality: N/A;   COLONOSCOPY  11/09, 10/29/10   Dr. Ewing Schlein   ERCP N/A 01/28/2019   Procedure: ENDOSCOPIC RETROGRADE CHOLANGIOPANCREATOGRAPHY (ERCP);  Surgeon: Willis Modena, MD;  Location: [facility];  Service: Endoscopy;  Laterality: N/A;   ERCP N/A 04/12/2019   Procedure: ENDOSCOPIC RETROGRADE CHOLANGIOPANCREATOGRAPHY (ERCP);  Surgeon: Vida Rigger, MD;  Location: [facility];  Service: Endoscopy;  Laterality: N/A;  stent removal   ESOPHAGOGASTRODUODENOSCOPY  10/29/10   Dr. Ewing Schlein   INGUINAL HERNIA REPAIR Right 1989   MELANOMA EXCISION  10/08/10   R upper arm, Dr. Daphine Deutscher   MELANOMA EXCISION  03/16/2012   Procedure: MELANOMA EXCISION;  Surgeon: Valarie Merino, MD;  Location: [facility];  Service: General;  Laterality: Right;  excision of melanoma of right arm   REMOVAL OF STONES  01/28/2019   Procedure: REMOVAL OF STONES;  Surgeon: Willis Modena, MD;  Location: [facility];  Service: Endoscopy;;   SPHINCTEROTOMY  01/28/2019   Procedure: Dennison Mascot;  Surgeon: Willis Modena, MD;  Location: [facility];  Service: Endoscopy;;   SPHINCTEROTOMY  04/12/2019   Procedure: Dennison Mascot;  Surgeon: Vida Rigger, MD;  Location: [facility];  Service: Endoscopy;;  stone sludge   STENT REMOVAL  04/12/2019   Procedure: STENT REMOVAL;  Surgeon: Vida Rigger, MD;  Location: [facility];  Service: Endoscopy;;   TOTAL KNEE ARTHROPLASTY Right 2012   TOTAL KNEE ARTHROPLASTY Left 2005    Family History: Family History  Problem Relation Age of Onset   Diabetes Sister    Diabetes Brother    Heart disease Brother    Hyperlipidemia Brother    Hypertension Brother    Stroke Brother    Diabetes Sister    Cancer Paternal Uncle    Heart disease Paternal Uncle     Social History  reports that he has never smoked. He has never used  smokeless tobacco. He reports current alcohol use. He reports that he does not use drugs.  Allergies  Allergen Reactions   Penicillins Rash    Has patient had a PCN reaction causing immediate rash, facial/tongue/throat swelling, SOB or lightheadedness with hypotension: No Has patient had a PCN reaction causing severe  rash involving mucus membranes or skin necrosis: No Has patient had a PCN reaction that required hospitalization: No Has patient had a PCN reaction occurring within the last 10 years: No If all of the above answers are "NO", then may proceed with Cephalosporin use.     Medications   Current Facility-Administered Medications:    sodium chloride flush (NS) 0.9 % injection 3 mL, 3 mL, Intravenous, Once, Mesner, Barbara Cower, MD  Current Outpatient Medications:    acetaminophen (TYLENOL) 325 MG tablet, Take 650 mg by mouth every 6 (six) hours as needed for mild pain (pain score 1-3), moderate pain (pain score 4-6) or headache. (Patient not taking: Reported on 03/12/2023), Disp: , Rfl:    apixaban (ELIQUIS) 5 MG TABS tablet, Take 1 tablet by mouth twice daily, Disp: 180 tablet, Rfl: 1   Ascorbic Acid (VITAMIN C PO), Take 2 tablets by mouth daily., Disp: , Rfl:    Calcium Carbonate-Vitamin D 600-400 MG-UNIT tablet, Take 1 tablet by mouth 2 (two) times daily., Disp: , Rfl:    Coenzyme Q10 (COQ10) 100 MG CAPS, Take 100 mg by mouth daily at 12 noon., Disp: , Rfl:    fluorouracil (EFUDEX) 5 % cream, , Disp: , Rfl:    Glucosamine-Chondroitin (COSAMIN DS PO), Take 1 tablet by mouth 2 (two) times daily. With lunch & supper, Disp: , Rfl:    glucose blood (ONETOUCH VERIO) test strip, USE 1 STRIP TO CHECK GLUCOSE ONCE DAILY DX:E11.69, Disp: 100 each, Rfl: 3   hydrocortisone cream 1 %, Apply 1 Application topically 2 (two) times daily. (Patient not taking: Reported on 03/12/2023), Disp: , Rfl:    Lancets (ONETOUCH DELICA PLUS LANCET33G) MISC, Patient to check blood sugar once daily(usually in the  morning)., Disp: 100 each, Rfl: 3   lisinopril (ZESTRIL) 5 MG tablet, Take 1 tablet (5 mg total) by mouth daily., Disp: 90 tablet, Rfl: 3   loratadine (CLARITIN) 10 MG tablet, Take 10 mg by mouth daily., Disp: , Rfl:    metFORMIN (GLUCOPHAGE-XR) 500 MG 24 hr tablet, TAKE 3 TABLETS BY MOUTH ONCE DAILY WITH BREAKFAST, Disp: 270 tablet, Rfl: 1   Multiple Vitamins-Minerals (MULTIVITAMIN WITH MINERALS) tablet, Take 1 tablet by mouth daily., Disp: , Rfl:    Omega-3 Fatty Acids (FISH OIL PO), Take 1,600 mg by mouth daily with supper. , Disp: , Rfl:    simvastatin (ZOCOR) 20 MG tablet, Take 1 tablet (20 mg total) by mouth at bedtime., Disp: 90 tablet, Rfl: 3   terazosin (HYTRIN) 5 MG capsule, Take 1 capsule (5 mg total) by mouth at bedtime., Disp: 90 capsule, Rfl: 3   trospium (SANCTURA) 20 MG tablet, Take 20 mg by mouth at bedtime., Disp: , Rfl:   Vitals  There were no vitals filed for this visit.  There is no height or weight on file to calculate BMI.  Physical Exam   General: Laying comfortably in bed; in no acute distress.  HENT: Normal oropharynx and mucosa. Normal external appearance of ears and nose.  Neck: Supple, no pain or tenderness  CV: No JVD. No peripheral edema.  Pulmonary: Symmetric Chest rise. Normal respiratory effort.  Abdomen: Soft to touch, non-tender.  Ext: No cyanosis, edema, or deformity  Skin: No rash. Normal palpation of skin.   Musculoskeletal: Normal digits and nails by inspection. No clubbing.   Neurologic Examination  Mental status/Cognition: Alert, oriented to self, place, but not to month and year, good attention.  Speech/language: Fluent, comprehension intact, object naming intact, repetition intact.  Cranial nerves:   CN II Pupils equal and reactive to light, no VF deficits    CN III,IV,VI EOM intact, no gaze preference or deviation, no nystagmus    CN V normal sensation in V1, V2, and V3 segments bilaterally    CN VII no asymmetry, no nasolabial fold  flattening    CN VIII normal hearing to speech    CN IX & X normal palatal elevation, no uvular deviation    CN XI 5/5 head turn and 5/5 shoulder shrug bilaterally    CN XII midline tongue protrusion    Motor:  Muscle bulk: normal, tone normal, pronator drift none tremor none Mvmt Root Nerve  Muscle Right Left Comments  SA C5/6 Ax Deltoid 5 5   EF C5/6 Mc Biceps 5 5   EE C6/7/8 Rad Triceps 5 5   WF C6/7 Med FCR     WE C7/8 PIN ECU     F Ab C8/T1 U ADM/FDI 5 5   HF L1/2/3 Fem Illopsoas 5 5   KE L2/3/4 Fem Quad 5 5   DF L4/5 D Peron Tib Ant 5 5   PF S1/2 Tibial Grc/Sol 5 5    Sensation:  Light touch Intact throughout   Pin prick    Temperature    Vibration   Proprioception    Coordination/Complex Motor:  - Finger to Nose intact bilaterally - Heel to shin intact bilaterally - Rapid alternating movement are normal - Gait: deferred.   Labs/Imaging/Neurodiagnostic studies   CBC: No results for input(s): "WBC", "NEUTROABS", "HGB", "HCT", "MCV", "PLT" in the last 168 hours. Basic Metabolic Panel:  Lab Results  Component Value Date   NA 139 08/06/2022   K 4.7 08/06/2022   CO2 21 08/06/2022   GLUCOSE 119 (H) 08/06/2022   BUN 29 (H) 08/06/2022   CREATININE 1.05 08/06/2022   CALCIUM 9.6 08/06/2022   GFRNONAA 59 (L) 01/11/2020   GFRAA 68 01/11/2020   Lipid Panel:  Lab Results  Component Value Date   LDLCALC 60 03/04/2023   HgbA1c:  Lab Results  Component Value Date   HGBA1C 6.8 (A) 03/12/2023   Urine Drug Screen: No results found for: "LABOPIA", "COCAINSCRNUR", "LABBENZ", "AMPHETMU", "THCU", "LABBARB"  Alcohol Level No results found for: "ETH" INR  Lab Results  Component Value Date   INR 2.03 (H) 04/08/2010   APTT  Lab Results  Component Value Date   APTT 28 03/25/2010   AED levels: No results found for: "PHENYTOIN", "ZONISAMIDE", "LAMOTRIGINE", "LEVETIRACETA"  CT Head without contrast(Personally reviewed): CTH was negative for a large hypodensity  concerning for a large territory infarct or hyperdensity concerning for an ICH  MRI Brain w/o contrast: pending  Neurodiagnostics rEEG:  pending  ASSESSMENT   Andre Walker is a 89 y.o. male with hx of afibb on  eliquis, HLD, erectile dysfunction, DM2, hard of hearing, diverticulosis who presents with syncope and unresponsive and gradually improved and back to his baseline prior to arrival to the ED with no focal deficit noted on exam at arrival.  No prior similar episodes.  He does not have a history of strokes or seizures.  Etiology unclear.  RECOMMENDATIONS  - MRI Brain w/o contrast - rEEG - neurology will follow up on the MRI and rEEG but we do not plan to see him if the above is non revealing. He can be discharged if MRI Brain and rEEG is non revealing. ______________________________________________________________________    Signed, Erick Blinks, MD [facility]

## 2023-04-22 NOTE — Plan of Care (Signed)
 EEG ABNORMALITY - Intermittent slow, generalized IMPRESSION: This study is suggestive of mild diffuse encephalopathy. No seizures or epileptiform discharges were seen throughout the recording.  MRI No acute intracranial process.  These studies are non-revealing  Per Dr. Tollie Eth earlier note "- neurology will follow up on the MRI and rEEG but we do not plan to see him if the above is non revealing. He can be discharged if MRI Brain and rEEG is non revealing"   Discussed with primary team via secure chat

## 2023-04-22 NOTE — Progress Notes (Signed)
 EEG complete - results pending

## 2023-04-22 NOTE — Progress Notes (Signed)
  Echocardiogram 2D Echocardiogram has been performed.  Reinaldo Raddle Edilia Ghuman 04/22/2023, 2:26 PM

## 2023-04-22 NOTE — Progress Notes (Signed)
 OT Cancellation Note  Patient Details Name: Andre Walker MRN: [id number] DOB: January 21, 1933   Cancelled Treatment:    Reason Eval/Treat Not Completed: (P) Patient at procedure or test/ unavailable, at HD, will return when able.  Alexis Goodell 04/22/2023, 4:20 PM

## 2023-04-22 NOTE — ED Notes (Signed)
 PT's hear rate keeps drooping in the 40's and then coming back to the 60's

## 2023-04-22 NOTE — ED Notes (Signed)
 PT is urinating in urinal. Blood was noted.

## 2023-04-22 NOTE — H&P (Signed)
 ADMISSION HISTORY AND PHYSICAL   Andre Walker:[id number] DOB: 1932/08/24 DOA: 04/22/2023  PCP: Joselyn Arrow, MD Patient coming from: [facility] via [facility] ED  Chief Complaint: syncopal spell - found down   HPI:  89 year old ILF resident with a history of atrial fibrillation on Eliquis, HLD, DM2, and BPH who was found down at home unresponsive, wedged between the wall and the commode.  The patient had no recollection of the event that led to him being found in that position.  Help was summoned and the patient was able to assist the help in getting up from the floor.  EMS was summoned and initiated a code stroke because the patient was confused.  By the time the patient had arrived in the ER he was back to his baseline status.  CT head in the ER revealed no acute findings.  The patient denies any recent illness, decreased intake, new medications, or decreased thirst.  He notes that he has a stumbling gait at baseline for which she is undergoing physical therapy.  He remembers waking up in the night needing to use the bathroom, getting out of bed, and then the next thing he remembers he was being transported to the ER via EMS.  He reports no previous similar episodes.  Assessment/Plan  Syncopal episode v/s seizure - transient loss of consciousness / found down Neurology evaluated in the ED - CT head at presentation unrevealing - no focal neurologic deficits on presentation - MRI and EEG pending - TTE pending - ethanol level <10 - UA positive for ketones and glucose but otherwise unrevealing -clinically this sounds most consistent with a simple orthostatic syncopal spell - PT/OT to see  Chronic atrial fibrillation on Eliquis Presently well-controlled -continue usual Eliquis dose  DM2 A1c 6.04 March 2023 -appears to be well-controlled with oral therapy only -utilize SSI during this admission and monitor  BPH Continue usual home medications  Severe hypomagnesemia Magnesium 0.9 at  presentation -no clear etiology at present /patient not on diuretic chronically -supplement and monitor  Hypokalemia Potassium 3.0 presentation -supplement and follow -no clear etiology presently -patient is not on diuretics  DVT prophylaxis: Eliquis Code Status:  FULL Family Communication: I spoke with the patient's wife at bedside Disposition Plan: Place in observation -anticipate discharge back to ILF 2/27  Review of Systems: As per HPI otherwise 10 point review of systems negative.   Past Medical History:  Diagnosis Date   Actinic keratosis    Dr. Terri Piedra   Atrial fibrillation [facility])    BPH (benign prostatic hyperplasia)    controlled on Hytrin   Colon polyps    Dr. Ewing Schlein   Diabetes mellitus 2004   Diverticulosis    ED (erectile dysfunction)    Gallstones    Hearing loss    high frequency   Hiatal hernia    small, noted on EGD 10/2010   LAE (left atrial enlargement) 01/2019   Moderate, Noted on ECHO   Melanoma (HCC)    right arm   Microalbuminuria    on ACEI for microalbuminuria (does NOT have HTN)   Pneumonia 01/2019   Bilateral, noted on CT   Pure hypercholesterolemia 2006   Traumatic partial tear of biceps tendon 01/2009   "popeye" injury on right   Wears glasses    Wears hearing aid in both ears    Wears partial dentures    lower   Wears partial dentures    lower    Past Surgical History:  Procedure Laterality Date   APPENDECTOMY  1955   BILIARY STENT PLACEMENT N/A 01/28/2019   Procedure: BILIARY STENT PLACEMENT;  Surgeon: Willis Modena, MD;  Location: [facility];  Service: Endoscopy;  Laterality: N/A;   CATARACT EXTRACTION Bilateral R 05/07/15, L 05/21/15   Dr. Dione Booze   CHOLECYSTECTOMY N/A 01/27/2019   Procedure: LAPAROSCOPIC CHOLECYSTECTOMY WITH INTRAOPEREATIVE CHOLANGIOGRAM;  Surgeon: Ovidio Kin, MD;  Location: [facility] ORS;  Service: General;  Laterality: N/A;   COLONOSCOPY  11/09, 10/29/10   Dr. Ewing Schlein   ERCP N/A 01/28/2019   Procedure: ENDOSCOPIC  RETROGRADE CHOLANGIOPANCREATOGRAPHY (ERCP);  Surgeon: Willis Modena, MD;  Location: [facility];  Service: Endoscopy;  Laterality: N/A;   ERCP N/A 04/12/2019   Procedure: ENDOSCOPIC RETROGRADE CHOLANGIOPANCREATOGRAPHY (ERCP);  Surgeon: Vida Rigger, MD;  Location: [facility];  Service: Endoscopy;  Laterality: N/A;  stent removal   ESOPHAGOGASTRODUODENOSCOPY  10/29/10   Dr. Ewing Schlein   INGUINAL HERNIA REPAIR Right 1989   MELANOMA EXCISION  10/08/10   R upper arm, Dr. Daphine Deutscher   MELANOMA EXCISION  03/16/2012   Procedure: MELANOMA EXCISION;  Surgeon: Valarie Merino, MD;  Location: [facility];  Service: General;  Laterality: Right;  excision of melanoma of right arm   REMOVAL OF STONES  01/28/2019   Procedure: REMOVAL OF STONES;  Surgeon: Willis Modena, MD;  Location: [facility];  Service: Endoscopy;;   SPHINCTEROTOMY  01/28/2019   Procedure: Dennison Mascot;  Surgeon: Willis Modena, MD;  Location: [facility];  Service: Endoscopy;;   SPHINCTEROTOMY  04/12/2019   Procedure: Dennison Mascot;  Surgeon: Vida Rigger, MD;  Location: [facility];  Service: Endoscopy;;  stone sludge   STENT REMOVAL  04/12/2019   Procedure: STENT REMOVAL;  Surgeon: Vida Rigger, MD;  Location: [facility];  Service: Endoscopy;;   TOTAL KNEE ARTHROPLASTY Right 2012   TOTAL KNEE ARTHROPLASTY Left 2005    Family History  Family History  Problem Relation Age of Onset   Diabetes Sister    Diabetes Brother    Heart disease Brother    Hyperlipidemia Brother    Hypertension Brother    Stroke Brother    Diabetes Sister    Cancer Paternal Uncle    Heart disease Paternal Uncle     Social History   reports that he has never smoked. He has never used smokeless tobacco. He reports current alcohol use. He reports that he does not use drugs.  Allergies Allergies  Allergen Reactions   Penicillins Rash    Has patient had a PCN reaction causing immediate rash, facial/tongue/throat swelling, SOB or  lightheadedness with hypotension: No Has patient had a PCN reaction causing severe rash involving mucus membranes or skin necrosis: No Has patient had a PCN reaction that required hospitalization: No Has patient had a PCN reaction occurring within the last 10 years: No If all of the above answers are "NO", then may proceed with Cephalosporin use.     Prior to Admission medications   Medication Sig Start Date End Date Taking? Authorizing Provider  acetaminophen (TYLENOL) 325 MG tablet Take 650 mg by mouth every 6 (six) hours as needed for mild pain (pain score 1-3), moderate pain (pain score 4-6) or headache. Patient not taking: Reported on 03/12/2023    [provider]  apixaban Everlene Balls) 5 MG TABS tablet Take 1 tablet by mouth twice daily 04/07/23   [place], Peter M, MD  Ascorbic Acid (VITAMIN C PO) Take 2 tablets by mouth daily.    [provider]  Calcium Carbonate-Vitamin D  600-400 MG-UNIT tablet Take 1 tablet by mouth 2 (two) times daily.    [provider]  Coenzyme Q10 (COQ10) 100 MG CAPS Take 100 mg by mouth daily at 12 noon.    [provider]  fluorouracil (EFUDEX) 5 % cream  05/08/20   [provider]  Glucosamine-Chondroitin (COSAMIN DS PO) Take 1 tablet by mouth 2 (two) times daily. With lunch & supper    [provider]  glucose blood (ONETOUCH VERIO) test strip USE 1 STRIP TO CHECK GLUCOSE ONCE DAILY DX:E11.69 05/17/20   Joselyn Arrow, MD  hydrocortisone cream 1 % Apply 1 Application topically 2 (two) times daily. Patient not taking: Reported on 03/12/2023    [provider]  Lancets [facility] DELICA PLUS [place]) MISC Patient to check blood sugar once daily(usually in the morning). 06/11/22   Joselyn Arrow, MD  lisinopril (ZESTRIL) 5 MG tablet Take 1 tablet (5 mg total) by mouth daily. 03/12/23   Joselyn Arrow, MD  loratadine (CLARITIN) 10 MG tablet Take 10 mg by mouth daily.    [provider]  metFORMIN (GLUCOPHAGE-XR)  500 MG 24 hr tablet TAKE 3 TABLETS BY MOUTH ONCE DAILY WITH BREAKFAST 03/12/23   Joselyn Arrow, MD  Multiple Vitamins-Minerals (MULTIVITAMIN WITH MINERALS) tablet Take 1 tablet by mouth daily.    [provider]  Omega-3 Fatty Acids (FISH OIL PO) Take 1,600 mg by mouth daily with supper.     [provider]  simvastatin (ZOCOR) 20 MG tablet Take 1 tablet (20 mg total) by mouth at bedtime. 03/12/23   Joselyn Arrow, MD  terazosin (HYTRIN) 5 MG capsule Take 1 capsule (5 mg total) by mouth at bedtime. 03/12/23   Joselyn Arrow, MD  trospium (SANCTURA) 20 MG tablet Take 20 mg by mouth at bedtime. 01/08/22   [provider]    Physical Exam: Vitals:   04/22/23 0551 04/22/23 0607 04/22/23 0745  BP:  (!) 159/77 (!) 123/57  Pulse:  86 65  Resp:  20 18  Temp:  (!) 96.6 F (35.9 C)   TempSrc:  Axillary   SpO2:  90% 100%  Weight: 87.8 kg      Constitutional: NAD, calm, comfortable Eyes: PERRL, lids and conjunctivae normal ENMT: Mucous membranes are dry.  Neck: normal, supple, no masses Respiratory: clear to auscultation bilaterally, no wheezing, no crackles. Normal respiratory effort. No accessory muscle use.  Cardiovascular: Regular rate and rhythm, no murmurs / rubs / gallops. No extremity edema. 2+ pedal pulses.  Abdomen: No tenderness or masses to palpation. No hepatosplenomegaly. Bowel sounds positive. Not distended. Soft.  Musculoskeletal: No clubbing / cyanosis. No joint deformity upper and lower extremities. No contractures. Normal muscle tone.  Skin: No rashes, lesions, ulcers.  Neurologic: CN 2-12 grossly intact B. Sensation intact. Strength 5/5 in all 4 extremities.  Psychiatric: Normal judgment and insight. Alert and oriented x 3. Normal mood.    Labs on Admission:   CBC: Recent Labs  Lab 04/22/23 0544 04/22/23 0547  WBC 12.0*  --   NEUTROABS 8.7*  --   HGB 12.0* 11.6*  HCT 35.5* 34.0*  MCV 96.7  --   PLT 199  --    Basic Metabolic Panel: Recent Labs   Lab 04/22/23 0544 04/22/23 0547  NA 137 141  K 3.0* 3.2*  CL 112* 107  CO2 17*  --   GLUCOSE 222* 217*  BUN 18 19  CREATININE 1.00 1.00  CALCIUM 7.9*  --   MG 0.9*  --  GFR: Estimated Creatinine Clearance: 54.9 mL/min (by C-G formula based on SCr of 1 mg/dL).  Liver Function Tests: Recent Labs  Lab 04/22/23 0544  AST 19  ALT 15  ALKPHOS 57  BILITOT 0.6  PROT 5.4*  ALBUMIN 3.2*   Coagulation Profile: Recent Labs  Lab 04/22/23 0544  INR 1.2   Cardiac Enzymes: Recent Labs  Lab 04/22/23 0544  CKTOTAL 97   CBG: Recent Labs  Lab 04/22/23 0543  GLUCAP 236*    Urine analysis:    Component Value Date/Time   COLORURINE YELLOW 04/22/2023 0752   APPEARANCEUR CLEAR 04/22/2023 0752   LABSPEC 1.015 04/22/2023 0752   LABSPEC 1.015 02/23/2017 0936   PHURINE 5.0 04/22/2023 0752   GLUCOSEU 150 (A) 04/22/2023 0752   HGBUR NEGATIVE 04/22/2023 0752   BILIRUBINUR NEGATIVE 04/22/2023 0752   BILIRUBINUR negative 02/23/2017 0936   BILIRUBINUR neg 03/29/2014 0943   KETONESUR 5 (A) 04/22/2023 0752   PROTEINUR NEGATIVE 04/22/2023 0752   UROBILINOGEN negative 03/29/2014 0943   UROBILINOGEN 0.2 03/25/2010 1030   NITRITE NEGATIVE 04/22/2023 0752   LEUKOCYTESUR NEGATIVE 04/22/2023 0752     Radiological Exams on Admission: DG Chest Portable 1 View Result Date: 04/22/2023 IMPRESSION: 1. Mild pulmonary edema. 2.  Aortic Atherosclerosis (ICD10-I70.0). Electronically Signed   By: Tish Frederickson M.D.   On: 04/22/2023 07:19   CT HEAD CODE STROKE WO CONTRAST Result Date: 04/22/2023 IMPRESSION: 1. No acute or interval finding. 2. ASPECTS is 10  Electronically Signed   By: Tiburcio Pea M.D.   On: 04/22/2023 05:55    EKG: Independently reviewed.   Lonia Blood, MD [facility] Office  [phone] Pager - Text Page per Amion as per below:  On-Call/Text Page:      [email or web address]  If 7PM-7AM, please contact night-coverage [email or web address] 04/22/2023, 8:37  AM

## 2023-04-22 NOTE — ED Provider Notes (Signed)
 [place] [facility] DE[facility] AT [facility] Provider Note   CSN: [id number] Arrival date & time: 04/22/23  0540  An emergency department physician performed an initial assessment on this suspected stroke patient at 0541.  History {Add pertinent medical, surgical, social history, OB history to HPI:1} Chief Complaint  Patient presents with   Code Stroke    Andre Walker is a 89 y.o. male.  89 year old male who presents as a code stroke with EMS secondary to possible LVO positive.  Neurology spoke to family member.  States that the patient was in normal state of health when he went to bed around 1030.  She states that she heard something that she thinks is probably falling so she went to the bathroom and found him unresponsive between his foot and the wall she thinks that he likely fell/syncopized there.  She called EMS.  He slowly woke up before EMS got there but was very confused.  He slowly returned to baseline over the next 30 minutes or so at the time of arrival here he is at baseline.  Patient has no complaints to me at this time.  He knows where he is appendectomy not really sure why he is here does not remember much about the fall but does kind of remember going to the bathroom.        Home Medications Prior to Admission medications   Medication Sig Start Date End Date Taking? Authorizing Provider  acetaminophen (TYLENOL) 325 MG tablet Take 650 mg by mouth every 6 (six) hours as needed for mild pain (pain score 1-3), moderate pain (pain score 4-6) or headache. Patient not taking: Reported on 03/12/2023    [provider]  apixaban Everlene Balls) 5 MG TABS tablet Take 1 tablet by mouth twice daily 04/07/23   [place], Peter M, MD  Ascorbic Acid (VITAMIN C PO) Take 2 tablets by mouth daily.    [provider]  Calcium Carbonate-Vitamin D 600-400 MG-UNIT tablet Take 1 tablet by mouth 2 (two) times daily.    [provider]  Coenzyme Q10 (COQ10) 100 MG  CAPS Take 100 mg by mouth daily at 12 noon.    [provider]  fluorouracil (EFUDEX) 5 % cream  05/08/20   [provider]  Glucosamine-Chondroitin (COSAMIN DS PO) Take 1 tablet by mouth 2 (two) times daily. With lunch & supper    [provider]  glucose blood (ONETOUCH VERIO) test strip USE 1 STRIP TO CHECK GLUCOSE ONCE DAILY DX:E11.69 05/17/20   Joselyn Arrow, MD  hydrocortisone cream 1 % Apply 1 Application topically 2 (two) times daily. Patient not taking: Reported on 03/12/2023    [provider]  Lancets [facility] DELICA PLUS [place]) MISC Patient to check blood sugar once daily(usually in the morning). 06/11/22   Joselyn Arrow, MD  lisinopril (ZESTRIL) 5 MG tablet Take 1 tablet (5 mg total) by mouth daily. 03/12/23   Joselyn Arrow, MD  loratadine (CLARITIN) 10 MG tablet Take 10 mg by mouth daily.    [provider]  metFORMIN (GLUCOPHAGE-XR) 500 MG 24 hr tablet TAKE 3 TABLETS BY MOUTH ONCE DAILY WITH BREAKFAST 03/12/23   Joselyn Arrow, MD  Multiple Vitamins-Minerals (MULTIVITAMIN WITH MINERALS) tablet Take 1 tablet by mouth daily.    [provider]  Omega-3 Fatty Acids (FISH OIL PO) Take 1,600 mg by mouth daily with supper.     [provider]  simvastatin (ZOCOR) 20 MG tablet Take 1 tablet (20 mg total) by mouth  at bedtime. 03/12/23   Joselyn Arrow, MD  terazosin (HYTRIN) 5 MG capsule Take 1 capsule (5 mg total) by mouth at bedtime. 03/12/23   Joselyn Arrow, MD  trospium (SANCTURA) 20 MG tablet Take 20 mg by mouth at bedtime. 01/08/22   [provider]      Allergies    Penicillins    Review of Systems   Review of Systems  Physical Exam Updated Vital Signs BP (!) 159/77 (BP Location: Left Arm)   Pulse 86   Temp (!) 96.6 F (35.9 C) (Axillary)   Resp 20   Wt 87.8 kg   SpO2 90%   BMI 28.58 kg/m  Physical Exam Vitals and nursing note reviewed.  Constitutional:      Appearance: He is well-developed.  HENT:     Head:  Normocephalic and atraumatic.  Cardiovascular:     Rate and Rhythm: Normal rate.  Pulmonary:     Effort: Pulmonary effort is normal. No respiratory distress.  Abdominal:     General: There is no distension.  Musculoskeletal:        General: Normal range of motion.     Cervical back: Normal range of motion.  Skin:    General: Skin is warm and dry.  Neurological:     Mental Status: He is alert.     ED Results / Procedures / Treatments   Labs (all labs ordered are listed, but only abnormal results are displayed) Labs Reviewed  PROTIME-INR - Abnormal; Notable for the following components:      Result Value   Prothrombin Time 15.6 (*)    All other components within normal limits  CBC - Abnormal; Notable for the following components:   WBC 12.0 (*)    RBC 3.67 (*)    Hemoglobin 12.0 (*)    HCT 35.5 (*)    All other components within normal limits  DIFFERENTIAL - Abnormal; Notable for the following components:   Neutro Abs 8.7 (*)    All other components within normal limits  I-STAT CHEM 8, ED - Abnormal; Notable for the following components:   Potassium 3.2 (*)    Glucose, Bld 217 (*)    Calcium, Ion 1.09 (*)    TCO2 18 (*)    Hemoglobin 11.6 (*)    HCT 34.0 (*)    All other components within normal limits  CBG MONITORING, ED - Abnormal; Notable for the following components:   Glucose-Capillary 236 (*)    All other components within normal limits  APTT  ETHANOL  COMPREHENSIVE METABOLIC PANEL  URINALYSIS, ROUTINE W REFLEX MICROSCOPIC  CK  MAGNESIUM  TROPONIN I (HIGH SENSITIVITY)    EKG None  Radiology CT HEAD CODE STROKE WO CONTRAST Result Date: 04/22/2023 CLINICAL DATA:  Code stroke.  Left facial droop and aphasia. EXAM: CT HEAD WITHOUT CONTRAST TECHNIQUE: Contiguous axial images were obtained from the base of the skull through the vertex without intravenous contrast. RADIATION DOSE REDUCTION: This exam was performed according to the departmental dose-optimization  program which includes automated exposure control, adjustment of the mA and/or kV according to patient size and/or use of iterative reconstruction technique. COMPARISON:  01/24/2019 FINDINGS: Brain: No evidence of acute infarction, hemorrhage, hydrocephalus, extra-axial collection or mass lesion/mass effect. Generalized cerebral volume loss in keeping with age. Minor chronic small vessel ischemia in the cerebral white matter. Significant streak artifact over the pons, unavoidable. Vascular: No hyperdense vessel or unexpected calcification. Skull: Normal. Negative for fracture or focal lesion. Sinuses/Orbits: Chronic bilateral  sphenoid and right posterior ethmoid sinus opacification when compared to prior. Other: Prelim sent in epic chat. ASPECTS [facility] Stroke Program Early CT Score) - Ganglionic level infarction (caudate, lentiform nuclei, internal capsule, insula, M1-M3 cortex): 7 - Supraganglionic infarction (M4-M6 cortex): 3 Total score (0-10 with 10 being normal): 10 IMPRESSION: 1. No acute or interval finding. 2. ASPECTS is 10 Electronically Signed   By: Tiburcio Pea M.D.   On: 04/22/2023 05:55    Procedures Procedures    Medications Ordered in ED Medications  sodium chloride flush (NS) 0.9 % injection 3 mL (3 mLs Intravenous Given 04/22/23 1610)    ED Course/ Medical Decision Making/ A&P                                 Medical Decision Making Amount and/or Complexity of Data Reviewed Labs: ordered. Radiology: ordered.   Patient here as a code stroke but not a TNK candidate as it is unlikely stroke seems more consistent with syncope versus less likely seizure and is back to baseline.  Neurology consulted, MRI to rule out stroke but also needs EEG for possible seizure rule out.  Patient with a leukocytosis on his labs so we will make sure there is no infectious causes for his symptoms but with his history of A-fib and on blood thinners and questionable syncope will need to be admitted  for workup and monitoring of that anyway.  {Document critical care time when appropriate:1} {Document review of labs and clinical decision tools ie heart score, Chads2Vasc2 etc:1}  {Document your independent review of radiology images, and any outside records:1} {Document your discussion with family members, caretakers, and with consultants:1} {Document social determinants of health affecting pt's care:1} {Document your decision making why or why not admission, treatments were needed:1} Final Clinical Impression(s) / ED Diagnoses Final diagnoses:  None    Rx / DC Orders ED Discharge Orders     None

## 2023-04-22 NOTE — ED Provider Notes (Signed)
 Pt signed out by Dr. Clayborne Dana pending labs.  Pt had a syncopal event vs seizure early this am.  He came in as a code stroke.    Covid/flu/rsv neg.  BNP sl elevated at 211.6  Mg low at 0.9, so he's given 2 g of Mg  CK nl  Trop sl elevated at 18  CMP with K low at 3.0,  pt given K IV and oral  Glucose sl elevated at 222  MRI pending upon admission.  Pt d/w Dr. Sharon Seller ([facility]) for admission.       Jacalyn Lefevre, MD 04/22/23 [phone]

## 2023-04-22 NOTE — ED Triage Notes (Signed)
 [facility] from [facility]. Pt was not responsive upon ems arrival . Patient then began rolling on floor not responding. LKW 1030 pm. In ems truck patient still having incomprehensible speech and 3 minutes out from hospital pt became alert and oriented and at baseline with ems.

## 2023-04-22 NOTE — Procedures (Signed)
 Patient Name: Andre Walker  MRN: [id number]  Epilepsy Attending: Charlsie Quest  Referring Physician/Provider: Marily Memos, MD  Date: 04/22/2023 Duration: 22.29 mins  Patient history: 89 y.o. male with hx of afibb on  eliquis, HLD, erectile dysfunction, DM2, hard of hearing, diverticulosis who presents with syncope and unresponsive and gradually improved and back to his baseline prior to arrival to the ED with no focal deficit noted on exam at arrival. EEG to evaluate for seizure  Level of alertness: Awake, asleep  AEDs during EEG study: None  Technical aspects: This EEG study was done with scalp electrodes positioned according to the 10-20 International system of electrode placement. Electrical activity was reviewed with band pass filter of 1-70Hz , sensitivity of 7 uV/mm, display speed of 61mm/sec with a 60Hz  notched filter applied as appropriate. EEG data were recorded continuously and digitally stored.  Video monitoring was available and reviewed as appropriate.  Description: The posterior dominant rhythm consists of 7 Hz activity of moderate voltage (25-35 uV) seen predominantly in posterior head regions, symmetric and reactive to eye opening and eye closing. Sleep was characterized by vertex waves, sleep spindles (12 to 14 Hz), maximal frontocentral region. EEG showed intermittent generalized 3 to 6 Hz theta-delta slowing. Hyperventilation and photic stimulation were not performed.     ABNORMALITY - Intermittent slow, generalized  IMPRESSION: This study is suggestive of mild diffuse encephalopathy. No seizures or epileptiform discharges were seen throughout the recording.  Sayvon Arterberry Annabelle Harman

## 2023-04-23 DIAGNOSIS — R55 Syncope and collapse: Secondary | ICD-10-CM | POA: Diagnosis not present

## 2023-04-23 DIAGNOSIS — E7800 Pure hypercholesterolemia, unspecified: Secondary | ICD-10-CM

## 2023-04-23 DIAGNOSIS — E1169 Type 2 diabetes mellitus with other specified complication: Secondary | ICD-10-CM

## 2023-04-23 DIAGNOSIS — E876 Hypokalemia: Secondary | ICD-10-CM | POA: Diagnosis not present

## 2023-04-23 LAB — CBC
HCT: 37.3 % — ABNORMAL LOW (ref 39.0–52.0)
Hemoglobin: 12.7 g/dL — ABNORMAL LOW (ref 13.0–17.0)
MCH: 32.4 pg (ref 26.0–34.0)
MCHC: 34 g/dL (ref 30.0–36.0)
MCV: 95.2 fL (ref 80.0–100.0)
Platelets: 223 10*3/uL (ref 150–400)
RBC: 3.92 MIL/uL — ABNORMAL LOW (ref 4.22–5.81)
RDW: 12.7 % (ref 11.5–15.5)
WBC: 9.3 10*3/uL (ref 4.0–10.5)
nRBC: 0 % (ref 0.0–0.2)

## 2023-04-23 LAB — HEMOGLOBIN A1C
Hgb A1c MFr Bld: 6.6 % — ABNORMAL HIGH (ref 4.8–5.6)
Mean Plasma Glucose: 142.72 mg/dL

## 2023-04-23 LAB — LIPID PANEL
Cholesterol: 101 mg/dL (ref 0–200)
HDL: 41 mg/dL (ref >40)
LDL Cholesterol: 45 mg/dL (ref 0–99)
Total CHOL/HDL Ratio: 2.5 ratio
Triglycerides: 76 mg/dL (ref <150)
VLDL: 15 mg/dL (ref 0–40)

## 2023-04-23 LAB — COMPREHENSIVE METABOLIC PANEL
ALT: 17 U/L (ref 0–44)
AST: 20 U/L (ref 15–41)
Albumin: 3.4 g/dL — ABNORMAL LOW (ref 3.5–5.0)
Alkaline Phosphatase: 61 U/L (ref 38–126)
Anion gap: 6 (ref 5–15)
BUN: 16 mg/dL (ref 8–23)
CO2: 24 mmol/L (ref 22–32)
Calcium: 8.9 mg/dL (ref 8.9–10.3)
Chloride: 108 mmol/L (ref 98–111)
Creatinine, Ser: 1.02 mg/dL (ref 0.61–1.24)
GFR, Estimated: >60 mL/min (ref >60)
Glucose, Bld: 134 mg/dL — ABNORMAL HIGH (ref 70–99)
Potassium: 4.3 mmol/L (ref 3.5–5.1)
Sodium: 138 mmol/L (ref 135–145)
Total Bilirubin: 1.1 mg/dL (ref 0.0–1.2)
Total Protein: 6 g/dL — ABNORMAL LOW (ref 6.5–8.1)

## 2023-04-23 LAB — MAGNESIUM: Magnesium: 1.6 mg/dL — ABNORMAL LOW (ref 1.7–2.4)

## 2023-04-23 LAB — GLUCOSE, CAPILLARY: Glucose-Capillary: 118 mg/dL — ABNORMAL HIGH (ref 70–99)

## 2023-04-23 MED ORDER — MAGNESIUM SULFATE 4 GM/100ML IV SOLN
4.0000 g | Freq: Once | INTRAVENOUS | Status: AC
Start: 2023-04-23 — End: 2023-04-23
  Administered 2023-04-23: 4 g via INTRAVENOUS
  Filled 2023-04-23 (×2): qty 100

## 2023-04-23 NOTE — Plan of Care (Signed)
 Patient is alert and oriented. Patient is a bit unsteady on legs but pivots well to bedside commode. Vitals stable.

## 2023-04-23 NOTE — TOC Transition Note (Signed)
 Transition of Care [facility]) - Discharge Note   Patient Details  Name: Andre Walker MRN: [id number] Date of Birth: [birth year]  [facility] CM/SW Contact:  Gordy Clement, RN Phone Number: 04/23/2023, 10:12 AM   Clinical Narrative:     Patient will DC to home today. No DME needed. Patient will continue with his OPPT at [facility]. Family member to transport           Patient Goals and CMS Choice            Discharge Placement                       Discharge Plan and Services Additional resources added to the After Visit Summary for                                       Social Drivers of Health (SDOH) Interventions SDOH Screenings   Food Insecurity: No Food Insecurity (04/22/2023)  Housing: Low Risk  (04/22/2023)  Transportation Needs: No Transportation Needs (04/22/2023)  Utilities: Not At Risk (04/22/2023)  Depression (PHQ2-9): Low Risk  (03/12/2023)  Social Connections: Socially Integrated (04/22/2023)  Tobacco Use: Low Risk  (04/22/2023)     Readmission Risk Interventions     No data to display

## 2023-04-23 NOTE — TOC Initial Note (Addendum)
 Transition of Care [facility]) - Initial/Assessment Note    Patient Details  Name: Andre Walker MRN: [id number] Date of Birth: [birth year]  Transition of Care [facility]) CM/SW Contact:    Lamonte Sakai, Student-Social Work Phone Number: 04/23/2023, 9:47 AM  Clinical Narrative:                  Pt admitted from [facility] with spouse. TOC following for HHPT recommendation.       Patient Goals and CMS Choice            Expected Discharge Plan and Services       Living arrangements for the past 2 months: Apartment Expected Discharge Date: 04/23/23                                    Prior Living Arrangements/Services Living arrangements for the past 2 months: Apartment Lives with:: Spouse                   Activities of Daily Living   ADL Screening (condition at time of admission) Independently performs ADLs?: Yes (appropriate for developmental age) Is the patient deaf or have difficulty hearing?: Yes Does the patient have difficulty seeing, even when wearing glasses/contacts?: No Does the patient have difficulty concentrating, remembering, or making decisions?: No  Permission Sought/Granted                  Emotional Assessment              Admission diagnosis:  Hypokalemia [E87.6] Hypomagnesemia [E83.42] Syncope [R55] Syncope, unspecified syncope type [R55] Patient Active Problem List   Diagnosis Date Noted   Syncope 04/22/2023   Acquired thrombophilia (HCC) 08/05/2022   Acute cholecystitis 01/25/2019   Chronic atrial fibrillation (HCC) 01/25/2019   Medication monitoring encounter 08/02/2018   Diabetes mellitus with microalbuminuria (HCC) 08/02/2018   Subclinical hypothyroidism 08/02/2018   Type 2 diabetes mellitus with hypercholesterolemia (HCC) 08/02/2018   Elevated serum creatinine    UTI (urinary tract infection) 01/24/2017   AKI (acute kidney injury) (HCC) 01/24/2017   Squamous cell carcinoma of leg, right 01/14/2016    Senile purpura (HCC) 09/18/2014   Increased prostate specific antigen (PSA) velocity 03/22/2014   Benign prostatic hyperplasia 03/22/2014   Melanoma of right arm 03/05/2012   Actinic keratoses 12/03/2011   Pure hypercholesterolemia 12/02/2010   Type 2 diabetes mellitus, controlled (HCC) 12/02/2010   PCP:  Joselyn Arrow, MD Pharmacy:   [facility] [address], [place] - [address] [place] [address] Phone: [phone] Fax: [phone]     Social Drivers of Health (SDOH) Social History: SDOH Screenings   Food Insecurity: No Food Insecurity (04/22/2023)  Housing: Low Risk  (04/22/2023)  Transportation Needs: No Transportation Needs (04/22/2023)  Utilities: Not At Risk (04/22/2023)  Depression (PHQ2-9): Low Risk  (03/12/2023)  Social Connections: Socially Integrated (04/22/2023)  Tobacco Use: Low Risk  (04/22/2023)   SDOH Interventions:     Readmission Risk Interventions     No data to display

## 2023-04-23 NOTE — Evaluation (Signed)
 Occupational Therapy Evaluation Patient Details Name: Andre Walker MRN: [id number] DOB: 04/29/1932 Today's Date: 04/23/2023   History of Present Illness   Andre Walker is a 89 y.o. male presents to ED after recent fall, syncope episode. Pt was found down at home unresponsive. CT head in the ER revealed no acute findings. EEG is suggestive of mild diffuse encephalopathy. No seizures or epileptiform discharges were seen throughout the recording. PMHx includes A-fib on eliquis, HLD, DM2, hard of hearing, diverticulosis.     Clinical Impressions Pt evaluated s/p the admission list above. At baseline, pt lives with wife at [facility], plays golf, drives, and is MOD I for ADLs and functional mobility. Upon evaluation, pt continues to be limited by deficits listed below. Overall, pt required up to MIN A for functional mobility using RW and up to MIN A for ADLs. Pt educated on safe management of RW when performing functional mobility to reduce risk of falls. Compression stockings were ordered for pt and donned during session. Pt received XXL compression stockings. Pt and pt wife was educated on purchasing smaller compression stockings to assist with management of BP. Pt and wife verbalized understanding. Pt BP monitored and remained stable throughout therapy session. Pt educated on signs and symptoms of fatigue and pre-syncope episodes and educated on safety awareness and energy conservation to reduce risk of falls and additional syncope episodes. Pt verbalized understanding. Anticipates pt is close to functional baseline. Pt does not have acute OT needs. Recommends pt discharge home with family support.    If plan is discharge home, recommend the following:   A little help with walking and/or transfers;A little help with bathing/dressing/bathroom;Assistance with cooking/housework;Assist for transportation;Help with stairs or ramp for entrance;Direct supervision/assist for financial management;Direct  supervision/assist for medications management     Functional Status Assessment   Patient has had a recent decline in their functional status and demonstrates the ability to make significant improvements in function in a reasonable and predictable amount of time.     Equipment Recommendations   None recommended by OT     Recommendations for Other Services         Precautions/Restrictions   Precautions Precautions: Fall Restrictions Weight Bearing Restrictions Per Provider Order: No     Mobility Bed Mobility Overal bed mobility: Needs Assistance             General bed mobility comments: NT; pt seated in chair upon arrival    Transfers Overall transfer level: Needs assistance Equipment used: Rolling walker (2 wheels) Transfers: Sit to/from Stand Sit to Stand: Min assist           General transfer comment: CGA provided for safety when performing STS. No physical assistance required. Pt required MIN A and verbal cues for safe management of RW during functional mobility. Pt also required verbal cues for self-placement in relation to RW      Balance Overall balance assessment: Needs assistance Sitting-balance support: No upper extremity supported, Feet supported Sitting balance-Leahy Scale: Good Sitting balance - Comments: performed functional bending and reaching to complete LBD   Standing balance support: No upper extremity supported Standing balance-Leahy Scale: Fair Standing balance comment: completed clothing management while standing unsupported.                           ADL either performed or assessed with clinical judgement   ADL Overall ADL's : Needs assistance/impaired Eating/Feeding: Independent   Grooming: Wash/dry hands;Standing;Contact guard assist  Upper Body Bathing: Supervision/ safety;Sitting   Lower Body Bathing: Contact guard assist;Sit to/from stand   Upper Body Dressing : Sitting;Cueing for  sequencing;Supervision/safety   Lower Body Dressing: Minimal assistance;Sit to/from stand   Toilet Transfer: Minimal assistance;Regular Toilet;Rolling walker (2 wheels)   Toileting- Clothing Manipulation and Hygiene: Contact guard assist;Sit to/from stand       Functional mobility during ADLs: Minimal assistance General ADL Comments: Pt completed functional mobility with up to MIN A. Pt required verbal cues for safety and MIN A for safe management of RW when performing functional mobility.     Vision Baseline Vision/History: 1 Wears glasses Ability to See in Adequate Light: 0 Adequate Patient Visual Report: No change from baseline Vision Assessment?: No apparent visual deficits     Perception Perception: Not tested       Praxis Praxis: Not tested       Pertinent Vitals/Pain Pain Assessment Pain Assessment: No/denies pain     Extremity/Trunk Assessment Upper Extremity Assessment Upper Extremity Assessment: Overall WFL for tasks assessed   Lower Extremity Assessment Lower Extremity Assessment: Defer to PT evaluation   Cervical / Trunk Assessment Cervical / Trunk Assessment: Kyphotic   Communication Communication Communication: Impaired Factors Affecting Communication: Hearing impaired   Cognition Arousal: Alert Behavior During Therapy: WFL for tasks assessed/performed Cognition: No apparent impairments             OT - Cognition Comments: Decreased safety awareness when performing functional ambulation using RW                 Following commands: Intact       Cueing  General Comments   Cueing Techniques: Verbal cues      Exercises     Shoulder Instructions      Home Living Family/patient expects to be discharged to:: Private residence Living Arrangements: Spouse/significant other Available Help at Discharge: Family;Available 24 hours/day Type of Home: Apartment Home Access: Elevator     Home Layout: One level     Bathroom  Shower/Tub: Producer, television/film/video: Handicapped height     Home Equipment: Agricultural consultant (2 wheels);Cane - single point          Prior Functioning/Environment Prior Level of Function : Driving;Independent/Modified Independent             Mobility Comments: pt was walking with RW in home, cane when going in the community and reports using electric cart while shopping. Still driving and golfing ADLs Comments: completes ADLs independently    OT Problem List: Decreased strength;Decreased activity tolerance;Impaired balance (sitting and/or standing);Decreased safety awareness;Decreased knowledge of use of DME or AE   OT Treatment/Interventions:        OT Goals(Current goals can be found in the care plan section)   Acute Rehab OT Goals Patient Stated Goal: to go home OT Goal Formulation: With patient Time For Goal Achievement: 04/23/23 Potential to Achieve Goals: Good   OT Frequency:       Co-evaluation              AM-PAC OT "6 Clicks" Daily Activity     Outcome Measure Help from another person eating meals?: None Help from another person taking care of personal grooming?: A Little Help from another person toileting, which includes using toliet, bedpan, or urinal?: A Little Help from another person bathing (including washing, rinsing, drying)?: A Little Help from another person to put on and taking off regular upper body clothing?: A Little Help from another  person to put on and taking off regular lower body clothing?: A Little 6 Click Score: 19   End of Session Equipment Utilized During Treatment: Rolling walker (2 wheels) Nurse Communication: Mobility status  Activity Tolerance: Patient tolerated treatment well Patient left: in chair;with family/visitor present;with call bell/phone within reach  OT Visit Diagnosis: Unsteadiness on feet (R26.81);Other abnormalities of gait and mobility (R26.89);History of falling (Z91.81);Muscle weakness  (generalized) (M62.81)                Time: 3086-5784 OT Time Calculation (min): 25 min Charges:     Lynnda Shields 04/23/2023, 11:46 AM

## 2023-04-23 NOTE — Evaluation (Signed)
 Physical Therapy Evaluation Patient Details Name: Andre Walker MRN: [id number] DOB: 03-Oct-1932 Today's Date: 04/23/2023  History of Present Illness  89 yo male admitted 2/26 after found down at home. CT head negative. Pt with severe hypomagnesemia and hypokalemia. PMhx: Afib on Eliquis, HLD, DM2, BPH  Clinical Impression  Pt pleasant and reports no falls at home. Pt still plays golf, drives and is receiving therapy at [facility]. Pt with impaired balance and 20 pt SBP drop in standing but asymptomatic at this time. Pt educated for increased time with transitions and recommendation to use RW at all times with mobility. Pt will benefit from acute therapy to maximize safety and independence.   Orthostatic BPs  Supine 155/76 (98), HR 65  Sitting 152/76 (95), HR 78     Standing 131/70 (89), HR 79  Standing after 3 min 138/70 (84), HR 78  ]       If plan is discharge home, recommend the following: Assistance with cooking/housework   Can travel by private vehicle        Equipment Recommendations None recommended by PT  Recommendations for Other Services       Functional Status Assessment Patient has had a recent decline in their functional status and demonstrates the ability to make significant improvements in function in a reasonable and predictable amount of time.     Precautions / Restrictions Precautions Precautions: Fall;Other (comment) Precaution/Restrictions Comments: check BP      Mobility  Bed Mobility Overal bed mobility: Needs Assistance Bed Mobility: Supine to Sit     Supine to sit: Min assist, HOB elevated     General bed mobility comments: HOB 20 degrees, pt struggling to fully elevate trunk and required min assist which he reports he normally does not require    Transfers Overall transfer level: Needs assistance   Transfers: Sit to/from Stand Sit to Stand: Supervision           General transfer comment: cues for hand placement and safety     Ambulation/Gait Ambulation/Gait assistance: Contact guard assist Gait Distance (Feet): 400 Feet Assistive device: Rolling walker (2 wheels) Gait Pattern/deviations: Step-through pattern, Decreased stride length   Gait velocity interpretation: 1.31 - 2.62 ft/sec, indicative of limited community ambulator   General Gait Details: cues for posture and proximity to [employer] Mobility     Tilt Bed    Modified Rankin (Stroke Patients Only)       Balance Overall balance assessment: Needs assistance Sitting-balance support: No upper extremity supported, Feet supported Sitting balance-Leahy Scale: Fair     Standing balance support: Bilateral upper extremity supported, Reliant on assistive device for balance Standing balance-Leahy Scale: Poor Standing balance comment: RW in standing                             Pertinent Vitals/Pain Pain Assessment Pain Assessment: No/denies pain    Home Living Family/patient expects to be discharged to:: Private residence Living Arrangements: Spouse/significant other Available Help at Discharge: Family;Available 24 hours/day Type of Home: Apartment Home Access: Elevator       Home Layout: One level Home Equipment: Agricultural consultant (2 wheels);Cane - single point      Prior Function Prior Level of Function : Driving;Independent/Modified Independent             Mobility Comments: pt was walking with RW in home, cane when going  in the community then holds cart while shopping. Still driving and golfing       Extremity/Trunk Assessment   Upper Extremity Assessment Upper Extremity Assessment: Generalized weakness    Lower Extremity Assessment Lower Extremity Assessment: Generalized weakness    Cervical / Trunk Assessment Cervical / Trunk Assessment: Kyphotic  Communication   Communication Communication: Impaired Factors Affecting Communication: Hearing impaired    Cognition  Arousal: Alert Behavior During Therapy: WFL for tasks assessed/performed   PT - Cognitive impairments: No apparent impairments                         Following commands: Intact       Cueing       General Comments      Exercises     Assessment/Plan    PT Assessment Patient needs continued PT services  PT Problem List Decreased strength;Decreased activity tolerance;Decreased balance       PT Treatment Interventions Gait training;DME instruction;Balance training;Functional mobility training;Therapeutic activities;Patient/family education    PT Goals (Current goals can be found in the Care Plan section)  Acute Rehab PT Goals Patient Stated Goal: return to golf PT Goal Formulation: With patient/family Time For Goal Achievement: 04/30/23 Potential to Achieve Goals: Good    Frequency Min 1X/week     Co-evaluation               AM-PAC PT "6 Clicks" Mobility  Outcome Measure Help needed turning from your back to your side while in a flat bed without using bedrails?: None Help needed moving from lying on your back to sitting on the side of a flat bed without using bedrails?: A Little Help needed moving to and from a bed to a chair (including a wheelchair)?: A Little Help needed standing up from a chair using your arms (e.g., wheelchair or bedside chair)?: A Little Help needed to walk in hospital room?: A Little Help needed climbing 3-5 steps with a railing? : A Little 6 Click Score: 19    End of Session Equipment Utilized During Treatment: Gait belt Activity Tolerance: Patient tolerated treatment well Patient left: in chair;with call bell/phone within reach;with family/visitor present;with chair alarm set Nurse Communication: Mobility status PT Visit Diagnosis: Other abnormalities of gait and mobility (R26.89);Difficulty in walking, not elsewhere classified (R26.2)    Time: 4098-1191 PT Time Calculation (min) (ACUTE ONLY): 33 min   Charges:   PT  Evaluation $PT Eval Moderate Complexity: 1 Mod PT Treatments $Therapeutic Activity: 8-22 mins PT General Charges $$ ACUTE PT VISIT: 1 Visit         Andre Walker, PT Acute Rehabilitation Services Office: [phone]   Andre Walker 04/23/2023, 9:25 AM

## 2023-04-23 NOTE — Discharge Summary (Addendum)
 PATIENT DETAILS Name: Andre Walker Age: 89 y.o. Sex: male Date of Birth: Jan 05, 1933 MRN: [id number]. Admitting Physician: Lonia Blood, MD KGM:WNUUV, Eve, MD  Admit Date: 04/22/2023 Discharge date: 04/23/2023  Recommendations for Outpatient Follow-up:  Follow up with PCP in 1-2 weeks Please obtain CMP/CBC/magnesium in one week Amount of permissive hypertension-has positive orthostatic vitals sign Has chronic unsteady gait-has not had major falls yet-cautiously continuing with Eliquis for now-if he were to have frequent falls in the future-this may need to be stopped.  Admitted From:  Home  Disposition: Home health   Discharge Condition: fair  CODE STATUS:   Code Status: Full Code   Diet recommendation:  Diet Order             Diet - low sodium heart healthy           Diet Carb Modified           Diet regular Fluid consistency: Thin  Diet effective now                    Brief Summary: 89 year old with chronic atrial fibrillation, HLD, DM-2, BPH-who presented following a syncopal episode-after getting up and walking to the bathroom.  He apparently was found unresponsive by family member wedged between the wall and the component.  He was subsequently brought to the ED and admitted to the hospitalist service.  Significant studies 2/26>> MRI brain: No acute intracranial process 2/26>> EEG: No seizures 2/26>> echo: EF 50-55%  Consults Neurology  Brief Hospital Course: Syncope Suspected to have vasovagal syncope-likely triggered by micturition.  His orthostatic vital signs were positive this morning but he is currently asymptomatic-this could be a alternative etiology as well. Tele-with A-fib-rate controlled-no other arrhythmias noted. Echo stable Discussed with spouse and patient-patient provided regarding intervention/exercise regimen to be done upon change posture.  Will ask nursing staff to place TED hose prior to discharge. Allow permissive  hypertension-stopping lisinopril for now.  DM-2 A1c stable Lifestyle/diet controlled/metformin  HLD Statin  BPH Cautiously continuing alpha-blocker for now-if he were to continue to have issues with orthostatic hypotension-this may need to be discontinued.  Per patient/spouse-his urologist wanted him to continue on this medication.  HTN BP stable Given orthostatic vital signs-although asymptomatic-plan is to allow for some amount of permissive hypertension-hence stopping lisinopril. PCP to follow and optimize  Hypomagnesemia/hypokalemia Repleted-magnesium still borderline low-Will be repleted prior to discharge.  Chronic atrial fibrillation Rate controlled without need for any rate control agents. Continue Eliquis-cautiously-if he were to have more falls in the future-we will need to consider stopping Eliquis at some point.  Discussed at length with spouse/patient-they are okay with continuing Eliquis for now.  Unsteady gait Chronic issue-apparently has been getting physical therapy at his independent living facility-this will be continued on discharge. Neuroimaging negative-see above.  Discharge Diagnoses:  Principal Problem:   Syncope   Discharge Instructions:  Activity:  As tolerated with Full fall precautions use walker/cane & assistance as needed  Discharge Instructions     Call MD for:  extreme fatigue   Complete by: As directed    Call MD for:  persistant dizziness or light-headedness   Complete by: As directed    Diet - low sodium heart healthy   Complete by: As directed    Diet Carb Modified   Complete by: As directed    Increase activity slowly   Complete by: As directed       Allergies as of 04/23/2023  Reactions   Penicillins Rash        Medication List     STOP taking these medications    lisinopril 5 MG tablet Commonly known as: ZESTRIL       TAKE these medications    acetaminophen 325 MG tablet Commonly known as:  TYLENOL Take 325-650 mg by mouth every 6 (six) hours as needed for mild pain (pain score 1-3) or moderate pain (pain score 4-6).   Calcium Carbonate-Vitamin D 600-400 MG-UNIT tablet Take 1 tablet by mouth 2 (two) times daily.   CoQ10 100 MG Caps Take 100 mg by mouth daily at 12 noon.   COSAMIN DS PO Take 1 tablet by mouth 2 (two) times daily. With lunch & supper   Eliquis 5 MG Tabs tablet Generic drug: apixaban Take 1 tablet by mouth twice daily   FISH OIL PO Take 1 capsule by mouth daily with supper.   fluorouracil 5 % cream Commonly known as: EFUDEX Apply 1 application  topically as needed (wounds).   loratadine 10 MG tablet Commonly known as: CLARITIN Take 10 mg by mouth daily.   metFORMIN 500 MG 24 hr tablet Commonly known as: GLUCOPHAGE-XR TAKE 3 TABLETS BY MOUTH ONCE DAILY WITH BREAKFAST What changed:  how much to take how to take this when to take this additional instructions   multivitamin with minerals tablet Take 1 tablet by mouth daily.   OneTouch Delica Plus Lancet33G Misc Patient to check blood sugar once daily(usually in the morning).   OneTouch Verio test strip Generic drug: glucose blood USE 1 STRIP TO CHECK GLUCOSE ONCE DAILY DX:E11.69   simvastatin 20 MG tablet Commonly known as: ZOCOR Take 1 tablet (20 mg total) by mouth at bedtime.   terazosin 5 MG capsule Commonly known as: HYTRIN Take 1 capsule (5 mg total) by mouth at bedtime.   trospium 20 MG tablet Commonly known as: SANCTURA Take 20 mg by mouth at bedtime.   VITAMIN C PO Take 2 tablets by mouth daily.        Allergies  Allergen Reactions   Penicillins Rash     Other Procedures/Studies: ECHOCARDIOGRAM COMPLETE Result Date: 04/22/2023    ECHOCARDIOGRAM REPORT   Patient Name:   Andre Walker Date of Exam: 04/22/2023 Medical Rec #:  [id number]     Height:       69.0 in Accession #:    [id number]    Weight:       193.6 lb Date of Birth:  30-Aug-1932     BSA:          2.037 m  Patient Age:    89 years      BP:           125/60 mmHg Patient Gender: M             HR:           60 bpm. Exam Location:  Inpatient Procedure: 2D Echo, 3D Echo, Cardiac Doppler, Color Doppler and Intracardiac            Opacification Agent (Both Spectral and Color Flow Doppler were            utilized during procedure). Indications:    Syncope  History:        Patient has prior history of Echocardiogram examinations, most                 recent 01/25/2021. Risk Factors:Diabetes.  Sonographer:    Karma Ganja Referring Phys:  JEFFREY T MCCLUNG IMPRESSIONS  1. Left ventricular ejection fraction, by estimation, is 50 to 55%. Left ventricular ejection fraction by 3D volume is 51 %. The left ventricle has low normal function. The left ventricle has no regional wall motion abnormalities. Left ventricular diastolic parameters are indeterminate.  2. Right ventricular systolic function is normal. The right ventricular size is normal. There is moderately elevated pulmonary artery systolic pressure. The estimated right ventricular systolic pressure is 48.9 mmHg.  3. Left atrial size was moderately dilated.  4. Right atrial size was moderately dilated.  5. The mitral valve is normal in structure. Mild mitral valve regurgitation. No evidence of mitral stenosis.  6. Tricuspid valve regurgitation is moderate.  7. The aortic valve is tricuspid. There is mild calcification of the aortic valve. Aortic valve regurgitation is mild. Aortic valve sclerosis/calcification is present, without any evidence of aortic stenosis. Aortic regurgitation PHT measures 801 msec. Aortic valve area, by VTI measures 2.77 cm. Aortic valve mean gradient measures 3.3 mmHg. Aortic valve Vmax measures 1.27 m/s.  8. Aortic dilatation noted. There is mild dilatation of the aortic root, measuring 40 mm.  9. The inferior vena cava is dilated in size with <50% respiratory variability, suggesting right atrial pressure of 15 mmHg. FINDINGS  Left Ventricle: Left  ventricular ejection fraction, by estimation, is 50 to 55%. Left ventricular ejection fraction by 3D volume is 51 %. The left ventricle has low normal function. The left ventricle has no regional wall motion abnormalities. Definity contrast agent was given IV to delineate the left ventricular endocardial borders. Strain imaging was not performed. The left ventricular internal cavity size was normal in size. There is no left ventricular hypertrophy. Left ventricular diastolic parameters are indeterminate. Right Ventricle: The right ventricular size is normal. No increase in right ventricular wall thickness. Right ventricular systolic function is normal. There is moderately elevated pulmonary artery systolic pressure. The tricuspid regurgitant velocity is 2.91 m/s, and with an assumed right atrial pressure of 15 mmHg, the estimated right ventricular systolic pressure is 48.9 mmHg. Left Atrium: Left atrial size was moderately dilated. Right Atrium: Right atrial size was moderately dilated. Pericardium: There is no evidence of pericardial effusion. Mitral Valve: The mitral valve is normal in structure. Mild mitral valve regurgitation. No evidence of mitral valve stenosis. Tricuspid Valve: The tricuspid valve is normal in structure. Tricuspid valve regurgitation is moderate . No evidence of tricuspid stenosis. Aortic Valve: The aortic valve is tricuspid. There is mild calcification of the aortic valve. Aortic valve regurgitation is mild. Aortic regurgitation PHT measures 801 msec. Aortic valve sclerosis/calcification is present, without any evidence of aortic stenosis. Aortic valve mean gradient measures 3.3 mmHg. Aortic valve peak gradient measures 6.5 mmHg. Aortic valve area, by VTI measures 2.77 cm. Pulmonic Valve: The pulmonic valve was normal in structure. Pulmonic valve regurgitation is mild to moderate. No evidence of pulmonic stenosis. Aorta: Aortic dilatation noted. There is mild dilatation of the aortic root,  measuring 40 mm. Venous: The inferior vena cava is dilated in size with less than 50% respiratory variability, suggesting right atrial pressure of 15 mmHg. IAS/Shunts: No atrial level shunt detected by color flow Doppler. Additional Comments: 3D was performed not requiring image post processing on an independent workstation and was normal.  LEFT VENTRICLE PLAX 2D LVIDd:         6.00 cm         Diastology LVIDs:         4.80 cm  LV e' medial:    11.43 cm/s LV PW:         1.00 cm         LV E/e' medial:  8.2 LV IVS:        1.20 cm         LV e' lateral:   16.67 cm/s LVOT diam:     2.20 cm         LV E/e' lateral: 5.6 LV SV:         70 LV SV Index:   35 LVOT Area:     3.80 cm        3D Volume EF                                LV 3D EF:    Left                                             ventricul LV Volumes (MOD)                            ar LV vol d, MOD    113.0 ml                   ejection A2C:                                        fraction LV vol d, MOD    141.5 ml                   by 3D A4C:                                        volume is LV vol s, MOD    61.7 ml                    51 %. A2C: LV vol s, MOD    76.3 ml A4C:                           3D Volume EF: LV SV MOD A2C:   51.4 ml       3D EF:        51 % LV SV MOD A4C:   141.5 ml      LV EDV:       153 ml LV SV MOD BP:    60.9 ml       LV ESV:       75 ml                                LV SV:        78 ml RIGHT VENTRICLE             IVC RV Basal diam:  5.00 cm     IVC diam: 2.40 cm RV S prime:     12.40 cm/s TAPSE (M-mode): 2.5 cm LEFT ATRIUM  Index        RIGHT ATRIUM           Index LA diam:        4.90 cm  2.40 cm/m   RA Area:     24.60 cm LA Vol (A2C):   158.0 ml 77.55 ml/m  RA Volume:   71.50 ml  35.09 ml/m LA Vol (A4C):   84.2 ml  41.33 ml/m LA Biplane Vol: 119.0 ml 58.41 ml/m  AORTIC VALVE AV Area (Vmax):    2.66 cm AV Area (Vmean):   2.58 cm AV Area (VTI):     2.77 cm AV Vmax:           127.00 cm/s AV Vmean:           84.733 cm/s AV VTI:            0.254 m AV Peak Grad:      6.5 mmHg AV Mean Grad:      3.3 mmHg LVOT Vmax:         88.87 cm/s LVOT Vmean:        57.567 cm/s LVOT VTI:          0.185 m LVOT/AV VTI ratio: 0.73 AI PHT:            801 msec  AORTA Ao Root diam: 4.00 cm Ao Asc diam:  3.40 cm MITRAL VALVE               TRICUSPID VALVE MV Area (PHT): 3.66 cm    TR Peak grad:   33.9 mmHg MV Decel Time: 207 msec    TR Vmax:        291.00 cm/s MR Peak grad: 95.5 mmHg MR Vmax:      488.67 cm/s  SHUNTS MV E velocity: 93.77 cm/s  Systemic VTI:  0.19 m                            Systemic Diam: 2.20 cm Arvilla Meres MD Electronically signed by Arvilla Meres MD Signature Date/Time: 04/22/2023/2:48:26 PM    Final    MR BRAIN WO CONTRAST Result Date: 04/22/2023 CLINICAL DATA:  Neuro deficit, acute, stroke suspected EXAM: MRI HEAD WITHOUT CONTRAST TECHNIQUE: Multiplanar, multiecho pulse sequences of the brain and surrounding structures were obtained without intravenous contrast. COMPARISON:  Same day CT head FINDINGS: Brain: Negative for an acute infarct. No hemorrhage. No hydrocephalus. No extra-axial fluid collection or mass effect. No mass lesion. There is background of mild chronic microvascular ischemic change. Chronic microhemorrhage in the anterior left temporal lobe. Generalized volume loss without lobar predominance. Vascular: Normal flow voids. Skull and upper cervical spine: Normal marrow signal. Sinuses/Orbits: No middle ear or mastoid effusion. Paranasal sinuses are notable for mucosal thickening in the bilateral ethmoid and sphenoid sinuses. Bilateral lens replacement. Orbits are otherwise unremarkable. Other: None. IMPRESSION: No acute intracranial process. Electronically Signed   By: Lorenza Cambridge M.D.   On: 04/22/2023 10:23   EEG adult Result Date: 04/22/2023 Charlsie Quest, MD     04/22/2023  9:24 AM Patient Name: DEUNDRA BARD MRN: [id number] Epilepsy Attending: Charlsie Quest Referring  Physician/Provider: Marily Memos, MD Date: 04/22/2023 Duration: 22.29 mins Patient history: 89 y.o. male with hx of afibb on  eliquis, HLD, erectile dysfunction, DM2, hard of hearing, diverticulosis who presents with syncope and unresponsive and gradually improved and back to his baseline prior to arrival to the ED with no  focal deficit noted on exam at arrival. EEG to evaluate for seizure Level of alertness: Awake, asleep AEDs during EEG study: None Technical aspects: This EEG study was done with scalp electrodes positioned according to the 10-20 International system of electrode placement. Electrical activity was reviewed with band pass filter of 1-70Hz , sensitivity of 7 uV/mm, display speed of 26mm/sec with a 60Hz  notched filter applied as appropriate. EEG data were recorded continuously and digitally stored.  Video monitoring was available and reviewed as appropriate. Description: The posterior dominant rhythm consists of 7 Hz activity of moderate voltage (25-35 uV) seen predominantly in posterior head regions, symmetric and reactive to eye opening and eye closing. Sleep was characterized by vertex waves, sleep spindles (12 to 14 Hz), maximal frontocentral region. EEG showed intermittent generalized 3 to 6 Hz theta-delta slowing. Hyperventilation and photic stimulation were not performed.   ABNORMALITY - Intermittent slow, generalized IMPRESSION: This study is suggestive of mild diffuse encephalopathy. No seizures or epileptiform discharges were seen throughout the recording. Charlsie Quest   DG Chest Portable 1 View Result Date: 04/22/2023 CLINICAL DATA:  eval for pneumonia EXAM: PORTABLE CHEST 1 VIEW COMPARISON:  Chest x-ray 01/24/2019 FINDINGS: The heart and mediastinal contours are unchanged. Atherosclerotic plaque. No focal consolidation. Increased interstitial markings and mild Kirby B-lines. No pleural effusion. No pneumothorax. No acute osseous abnormality. IMPRESSION: 1. Mild pulmonary edema. 2.   Aortic Atherosclerosis (ICD10-I70.0). Electronically Signed   By: Tish Frederickson M.D.   On: 04/22/2023 07:19   CT HEAD CODE STROKE WO CONTRAST Result Date: 04/22/2023 CLINICAL DATA:  Code stroke.  Left facial droop and aphasia. EXAM: CT HEAD WITHOUT CONTRAST TECHNIQUE: Contiguous axial images were obtained from the base of the skull through the vertex without intravenous contrast. RADIATION DOSE REDUCTION: This exam was performed according to the departmental dose-optimization program which includes automated exposure control, adjustment of the mA and/or kV according to patient size and/or use of iterative reconstruction technique. COMPARISON:  01/24/2019 FINDINGS: Brain: No evidence of acute infarction, hemorrhage, hydrocephalus, extra-axial collection or mass lesion/mass effect. Generalized cerebral volume loss in keeping with age. Minor chronic small vessel ischemia in the cerebral white matter. Significant streak artifact over the pons, unavoidable. Vascular: No hyperdense vessel or unexpected calcification. Skull: Normal. Negative for fracture or focal lesion. Sinuses/Orbits: Chronic bilateral sphenoid and right posterior ethmoid sinus opacification when compared to prior. Other: Prelim sent in epic chat. ASPECTS [facility] Stroke Program Early CT Score) - Ganglionic level infarction (caudate, lentiform nuclei, internal capsule, insula, M1-M3 cortex): 7 - Supraganglionic infarction (M4-M6 cortex): 3 Total score (0-10 with 10 being normal): 10 IMPRESSION: 1. No acute or interval finding. 2. ASPECTS is 10 Electronically Signed   By: Tiburcio Pea M.D.   On: 04/22/2023 05:55     TODAY-DAY OF DISCHARGE:  Subjective:   Andre Walker today has no headache,no chest abdominal pain,no new weakness tingling or numbness, feels much better wants to go home today.   Objective:   Blood pressure (!) 144/83, pulse 73, temperature (!) 97.4 F (36.3 C), temperature source Oral, resp. rate 19, height 5\' 10"  (1.778  m), weight 87.1 kg, SpO2 99%.  Intake/Output Summary (Last 24 hours) at 04/23/2023 0919 Last data filed at 04/22/2023 2300 Gross per 24 hour  Intake 47.38 ml  Output 600 ml  Net -552.62 ml   Filed Weights   04/22/23 0551 04/22/23 2014  Weight: 87.8 kg 87.1 kg    Exam: Awake Alert, Oriented *3, No new F.N deficits, Normal affect  .AT,PERRAL Supple Neck,No JVD, No cervical lymphadenopathy appriciated.  Symmetrical Chest wall movement, Good air movement bilaterally, CTAB RRR,No Gallops,Rubs or new Murmurs, No Parasternal Heave +ve B.Sounds, Abd Soft, Non tender, No organomegaly appriciated, No rebound -guarding or rigidity. No Cyanosis, Clubbing or edema, No new Rash or bruise   PERTINENT RADIOLOGIC STUDIES: ECHOCARDIOGRAM COMPLETE Result Date: 04/22/2023    ECHOCARDIOGRAM REPORT   Patient Name:   HARSHA YUSKO Date of Exam: 04/22/2023 Medical Rec #:  [id number]     Height:       69.0 in Accession #:    [id number]    Weight:       193.6 lb Date of Birth:  [birth year]     BSA:          2.037 m Patient Age:    89 years      BP:           125/60 mmHg Patient Gender: M             HR:           60 bpm. Exam Location:  Inpatient Procedure: 2D Echo, 3D Echo, Cardiac Doppler, Color Doppler and Intracardiac            Opacification Agent (Both Spectral and Color Flow Doppler were            utilized during procedure). Indications:    Syncope  History:        Patient has prior history of Echocardiogram examinations, most                 recent 01/25/2021. Risk Factors:Diabetes.  Sonographer:    Karma Ganja Referring Phys: JEFFREY T MCCLUNG IMPRESSIONS  1. Left ventricular ejection fraction, by estimation, is 50 to 55%. Left ventricular ejection fraction by 3D volume is 51 %. The left ventricle has low normal function. The left ventricle has no regional wall motion abnormalities. Left ventricular diastolic parameters are indeterminate.  2. Right ventricular systolic function is normal. The right ventricular  size is normal. There is moderately elevated pulmonary artery systolic pressure. The estimated right ventricular systolic pressure is 48.9 mmHg.  3. Left atrial size was moderately dilated.  4. Right atrial size was moderately dilated.  5. The mitral valve is normal in structure. Mild mitral valve regurgitation. No evidence of mitral stenosis.  6. Tricuspid valve regurgitation is moderate.  7. The aortic valve is tricuspid. There is mild calcification of the aortic valve. Aortic valve regurgitation is mild. Aortic valve sclerosis/calcification is present, without any evidence of aortic stenosis. Aortic regurgitation PHT measures 801 msec. Aortic valve area, by VTI measures 2.77 cm. Aortic valve mean gradient measures 3.3 mmHg. Aortic valve Vmax measures 1.27 m/s.  8. Aortic dilatation noted. There is mild dilatation of the aortic root, measuring 40 mm.  9. The inferior vena cava is dilated in size with <50% respiratory variability, suggesting right atrial pressure of 15 mmHg. FINDINGS  Left Ventricle: Left ventricular ejection fraction, by estimation, is 50 to 55%. Left ventricular ejection fraction by 3D volume is 51 %. The left ventricle has low normal function. The left ventricle has no regional wall motion abnormalities. Definity contrast agent was given IV to delineate the left ventricular endocardial borders. Strain imaging was not performed. The left ventricular internal cavity size was normal in size. There is no left ventricular hypertrophy. Left ventricular diastolic parameters are indeterminate. Right Ventricle: The right ventricular size is normal. No increase in right ventricular wall thickness. Right  ventricular systolic function is normal. There is moderately elevated pulmonary artery systolic pressure. The tricuspid regurgitant velocity is 2.91 m/s, and with an assumed right atrial pressure of 15 mmHg, the estimated right ventricular systolic pressure is 48.9 mmHg. Left Atrium: Left atrial size was  moderately dilated. Right Atrium: Right atrial size was moderately dilated. Pericardium: There is no evidence of pericardial effusion. Mitral Valve: The mitral valve is normal in structure. Mild mitral valve regurgitation. No evidence of mitral valve stenosis. Tricuspid Valve: The tricuspid valve is normal in structure. Tricuspid valve regurgitation is moderate . No evidence of tricuspid stenosis. Aortic Valve: The aortic valve is tricuspid. There is mild calcification of the aortic valve. Aortic valve regurgitation is mild. Aortic regurgitation PHT measures 801 msec. Aortic valve sclerosis/calcification is present, without any evidence of aortic stenosis. Aortic valve mean gradient measures 3.3 mmHg. Aortic valve peak gradient measures 6.5 mmHg. Aortic valve area, by VTI measures 2.77 cm. Pulmonic Valve: The pulmonic valve was normal in structure. Pulmonic valve regurgitation is mild to moderate. No evidence of pulmonic stenosis. Aorta: Aortic dilatation noted. There is mild dilatation of the aortic root, measuring 40 mm. Venous: The inferior vena cava is dilated in size with less than 50% respiratory variability, suggesting right atrial pressure of 15 mmHg. IAS/Shunts: No atrial level shunt detected by color flow Doppler. Additional Comments: 3D was performed not requiring image post processing on an independent workstation and was normal.  LEFT VENTRICLE PLAX 2D LVIDd:         6.00 cm         Diastology LVIDs:         4.80 cm         LV e' medial:    11.43 cm/s LV PW:         1.00 cm         LV E/e' medial:  8.2 LV IVS:        1.20 cm         LV e' lateral:   16.67 cm/s LVOT diam:     2.20 cm         LV E/e' lateral: 5.6 LV SV:         70 LV SV Index:   35 LVOT Area:     3.80 cm        3D Volume EF                                LV 3D EF:    Left                                             ventricul LV Volumes (MOD)                            ar LV vol d, MOD    113.0 ml                   ejection A2C:                                         fraction LV vol d, MOD    141.5 ml  by 3D A4C:                                        volume is LV vol s, MOD    61.7 ml                    51 %. A2C: LV vol s, MOD    76.3 ml A4C:                           3D Volume EF: LV SV MOD A2C:   51.4 ml       3D EF:        51 % LV SV MOD A4C:   141.5 ml      LV EDV:       153 ml LV SV MOD BP:    60.9 ml       LV ESV:       75 ml                                LV SV:        78 ml RIGHT VENTRICLE             IVC RV Basal diam:  5.00 cm     IVC diam: 2.40 cm RV S prime:     12.40 cm/s TAPSE (M-mode): 2.5 cm LEFT ATRIUM              Index        RIGHT ATRIUM           Index LA diam:        4.90 cm  2.40 cm/m   RA Area:     24.60 cm LA Vol (A2C):   158.0 ml 77.55 ml/m  RA Volume:   71.50 ml  35.09 ml/m LA Vol (A4C):   84.2 ml  41.33 ml/m LA Biplane Vol: 119.0 ml 58.41 ml/m  AORTIC VALVE AV Area (Vmax):    2.66 cm AV Area (Vmean):   2.58 cm AV Area (VTI):     2.77 cm AV Vmax:           127.00 cm/s AV Vmean:          84.733 cm/s AV VTI:            0.254 m AV Peak Grad:      6.5 mmHg AV Mean Grad:      3.3 mmHg LVOT Vmax:         88.87 cm/s LVOT Vmean:        57.567 cm/s LVOT VTI:          0.185 m LVOT/AV VTI ratio: 0.73 AI PHT:            801 msec  AORTA Ao Root diam: 4.00 cm Ao Asc diam:  3.40 cm MITRAL VALVE               TRICUSPID VALVE MV Area (PHT): 3.66 cm    TR Peak grad:   33.9 mmHg MV Decel Time: 207 msec    TR Vmax:        291.00 cm/s MR Peak grad: 95.5 mmHg MR Vmax:      488.67 cm/s  SHUNTS MV E velocity: 93.77 cm/s  Systemic VTI:  0.19 m                            Systemic Diam: 2.20 cm Arvilla Meres MD Electronically signed by Arvilla Meres MD Signature Date/Time: 04/22/2023/2:48:26 PM    Final    MR BRAIN WO CONTRAST Result Date: 04/22/2023 CLINICAL DATA:  Neuro deficit, acute, stroke suspected EXAM: MRI HEAD WITHOUT CONTRAST TECHNIQUE: Multiplanar, multiecho pulse sequences of the brain and surrounding  structures were obtained without intravenous contrast. COMPARISON:  Same day CT head FINDINGS: Brain: Negative for an acute infarct. No hemorrhage. No hydrocephalus. No extra-axial fluid collection or mass effect. No mass lesion. There is background of mild chronic microvascular ischemic change. Chronic microhemorrhage in the anterior left temporal lobe. Generalized volume loss without lobar predominance. Vascular: Normal flow voids. Skull and upper cervical spine: Normal marrow signal. Sinuses/Orbits: No middle ear or mastoid effusion. Paranasal sinuses are notable for mucosal thickening in the bilateral ethmoid and sphenoid sinuses. Bilateral lens replacement. Orbits are otherwise unremarkable. Other: None. IMPRESSION: No acute intracranial process. Electronically Signed   By: Lorenza Cambridge M.D.   On: 04/22/2023 10:23   EEG adult Result Date: 04/22/2023 Charlsie Quest, MD     04/22/2023  9:24 AM Patient Name: ALESSANDER SIKORSKI MRN: [id number] Epilepsy Attending: Charlsie Quest Referring Physician/Provider: Marily Memos, MD Date: 04/22/2023 Duration: 22.29 mins Patient history: 89 y.o. male with hx of afibb on  eliquis, HLD, erectile dysfunction, DM2, hard of hearing, diverticulosis who presents with syncope and unresponsive and gradually improved and back to his baseline prior to arrival to the ED with no focal deficit noted on exam at arrival. EEG to evaluate for seizure Level of alertness: Awake, asleep AEDs during EEG study: None Technical aspects: This EEG study was done with scalp electrodes positioned according to the 10-20 International system of electrode placement. Electrical activity was reviewed with band pass filter of 1-70Hz , sensitivity of 7 uV/mm, display speed of 41mm/sec with a 60Hz  notched filter applied as appropriate. EEG data were recorded continuously and digitally stored.  Video monitoring was available and reviewed as appropriate. Description: The posterior dominant rhythm consists of 7  Hz activity of moderate voltage (25-35 uV) seen predominantly in posterior head regions, symmetric and reactive to eye opening and eye closing. Sleep was characterized by vertex waves, sleep spindles (12 to 14 Hz), maximal frontocentral region. EEG showed intermittent generalized 3 to 6 Hz theta-delta slowing. Hyperventilation and photic stimulation were not performed.   ABNORMALITY - Intermittent slow, generalized IMPRESSION: This study is suggestive of mild diffuse encephalopathy. No seizures or epileptiform discharges were seen throughout the recording. Charlsie Quest   DG Chest Portable 1 View Result Date: 04/22/2023 CLINICAL DATA:  eval for pneumonia EXAM: PORTABLE CHEST 1 VIEW COMPARISON:  Chest x-ray 01/24/2019 FINDINGS: The heart and mediastinal contours are unchanged. Atherosclerotic plaque. No focal consolidation. Increased interstitial markings and mild Kirby B-lines. No pleural effusion. No pneumothorax. No acute osseous abnormality. IMPRESSION: 1. Mild pulmonary edema. 2.  Aortic Atherosclerosis (ICD10-I70.0). Electronically Signed   By: Tish Frederickson M.D.   On: 04/22/2023 07:19   CT HEAD CODE STROKE WO CONTRAST Result Date: 04/22/2023 CLINICAL DATA:  Code stroke.  Left facial droop and aphasia. EXAM: CT HEAD WITHOUT CONTRAST TECHNIQUE: Contiguous axial images were obtained from the base of the skull through the vertex without intravenous contrast. RADIATION DOSE REDUCTION: This exam was performed according to the departmental dose-optimization program which includes  automated exposure control, adjustment of the mA and/or kV according to patient size and/or use of iterative reconstruction technique. COMPARISON:  01/24/2019 FINDINGS: Brain: No evidence of acute infarction, hemorrhage, hydrocephalus, extra-axial collection or mass lesion/mass effect. Generalized cerebral volume loss in keeping with age. Minor chronic small vessel ischemia in the cerebral white matter. Significant streak artifact  over the pons, unavoidable. Vascular: No hyperdense vessel or unexpected calcification. Skull: Normal. Negative for fracture or focal lesion. Sinuses/Orbits: Chronic bilateral sphenoid and right posterior ethmoid sinus opacification when compared to prior. Other: Prelim sent in epic chat. ASPECTS [facility] Stroke Program Early CT Score) - Ganglionic level infarction (caudate, lentiform nuclei, internal capsule, insula, M1-M3 cortex): 7 - Supraganglionic infarction (M4-M6 cortex): 3 Total score (0-10 with 10 being normal): 10 IMPRESSION: 1. No acute or interval finding. 2. ASPECTS is 10 Electronically Signed   By: Tiburcio Pea M.D.   On: 04/22/2023 05:55     PERTINENT LAB RESULTS: CBC: Recent Labs    04/22/23 0544 04/22/23 0547 04/23/23 0445  WBC 12.0*  --  9.3  HGB 12.0* 11.6* 12.7*  HCT 35.5* 34.0* 37.3*  PLT 199  --  223   CMET CMP     Component Value Date/Time   NA 138 04/23/2023 0445   NA 139 08/06/2022 1414   K 4.3 04/23/2023 0445   CL 108 04/23/2023 0445   CO2 24 04/23/2023 0445   GLUCOSE 134 (H) 04/23/2023 0445   BUN 16 04/23/2023 0445   BUN 29 (H) 08/06/2022 1414   CREATININE 1.02 04/23/2023 0445   CREATININE 1.07 01/29/2017 1242   CALCIUM 8.9 04/23/2023 0445   PROT 6.0 (L) 04/23/2023 0445   PROT 6.7 08/06/2022 1414   ALBUMIN 3.4 (L) 04/23/2023 0445   ALBUMIN 4.4 08/06/2022 1414   AST 20 04/23/2023 0445   ALT 17 04/23/2023 0445   ALKPHOS 61 04/23/2023 0445   BILITOT 1.1 04/23/2023 0445   BILITOT 0.5 08/06/2022 1414   EGFR 68 08/06/2022 1414   GFRNONAA >60 04/23/2023 0445    GFR Estimated Creatinine Clearance: 50.7 mL/min (by C-G formula based on SCr of 1.02 mg/dL). No results for input(s): "LIPASE", "AMYLASE" in the last 72 hours. Recent Labs    04/22/23 0544  CKTOTAL 97   Invalid input(s): "POCBNP" No results for input(s): "DDIMER" in the last 72 hours. Recent Labs    04/23/23 0445  HGBA1C 6.6*   Recent Labs    04/23/23 0445  CHOL 101  HDL 41   LDLCALC 45  TRIG 76  CHOLHDL 2.5   No results for input(s): "TSH", "T4TOTAL", "T3FREE", "THYROIDAB" in the last 72 hours.  Invalid input(s): "FREET3" No results for input(s): "VITAMINB12", "FOLATE", "FERRITIN", "TIBC", "IRON", "RETICCTPCT" in the last 72 hours. Coags: Recent Labs    04/22/23 0544  INR 1.2   Microbiology: Recent Results (from the past 240 hours)  Resp panel by RT-PCR (RSV, Flu A&B, Covid) Anterior Nasal Swab     Status: None   Collection Time: 04/22/23  6:22 AM   Specimen: Anterior Nasal Swab  Result Value Ref Range Status   SARS Coronavirus 2 by RT PCR NEGATIVE NEGATIVE Final   Influenza A by PCR NEGATIVE NEGATIVE Final   Influenza B by PCR NEGATIVE NEGATIVE Final    Comment: (NOTE) The Xpert Xpress SARS-CoV-2/FLU/RSV plus assay is intended as an aid in the diagnosis of influenza from Nasopharyngeal swab specimens and should not be used as a sole basis for treatment. Nasal washings and aspirates are unacceptable  for Xpert Xpress SARS-CoV-2/FLU/RSV testing.  Fact Sheet for Patients: BloggerCourse.com  Fact Sheet for Healthcare Providers: SeriousBroker.it  This test is not yet approved or cleared by the [place] FDA and has been authorized for detection and/or diagnosis of SARS-CoV-2 by FDA under an Emergency Use Authorization (EUA). This EUA will remain in effect (meaning this test can be used) for the duration of the COVID-19 declaration under Section 564(b)(1) of the Act, 21 U.S.C. section 360bbb-3(b)(1), unless the authorization is terminated or revoked.     Resp Syncytial Virus by PCR NEGATIVE NEGATIVE Final    Comment: (NOTE) Fact Sheet for Patients: BloggerCourse.com  Fact Sheet for Healthcare Providers: SeriousBroker.it  This test is not yet approved or cleared by the [place] FDA and has been authorized for detection and/or  diagnosis of SARS-CoV-2 by FDA under an Emergency Use Authorization (EUA). This EUA will remain in effect (meaning this test can be used) for the duration of the COVID-19 declaration under Section 564(b)(1) of the Act, 21 U.S.C. section 360bbb-3(b)(1), unless the authorization is terminated or revoked.  Performed at [facility], [address], [place], [place] [address]     FURTHER DISCHARGE INSTRUCTIONS:  Get Medicines reviewed and adjusted: Please take all your medications with you for your next visit with your Primary MD  Laboratory/radiological data: Please request your Primary MD to go over all hospital tests and procedure/radiological results at the follow up, please ask your Primary MD to get all Hospital records sent to his/her office.  In some cases, they will be blood work, cultures and biopsy results pending at the time of your discharge. Please request that your primary care M.D. goes through all the records of your hospital data and follows up on these results.  Also Note the following: If you experience worsening of your admission symptoms, develop shortness of breath, life threatening emergency, suicidal or homicidal thoughts you must seek medical attention immediately by calling 911 or calling your MD immediately  if symptoms less severe.  You must read complete instructions/literature along with all the possible adverse reactions/side effects for all the Medicines you take and that have been prescribed to you. Take any new Medicines after you have completely understood and accpet all the possible adverse reactions/side effects.   Do not drive when taking Pain medications or sleeping medications (Benzodaizepines)  Do not take more than prescribed Pain, Sleep and Anxiety Medications. It is not advisable to combine anxiety,sleep and pain medications without talking with your primary care practitioner  Special Instructions: If you have smoked or chewed Tobacco  in  the last 2 yrs please stop smoking, stop any regular Alcohol  and or any Recreational drug use.  Wear Seat belts while driving.  Please note: You were cared for by a hospitalist during your hospital stay. Once you are discharged, your primary care physician will handle any further medical issues. Please note that NO REFILLS for any discharge medications will be authorized once you are discharged, as it is imperative that you return to your primary care physician (or establish a relationship with a primary care physician if you do not have one) for your post hospital discharge needs so that they can reassess your need for medications and monitor your lab values.  Total Time spent coordinating discharge including counseling, education and face to face time equals greater than 30 minutes.  SignedJeoffrey Massed 04/23/2023 9:19 AM

## 2023-04-23 NOTE — Progress Notes (Signed)
 OT Cancellation Note  Patient Details Name: Andre Walker MRN: [id number] DOB: Jun 19, 1932   Cancelled Treatment:    Reason Eval/Treat Not Completed: Other (comment) (pt's compression stockings not yet delievered, OT to evaluate once they have arrived.)  Donia Pounds 04/23/2023, 10:16 AM

## 2023-04-23 NOTE — Care Management Obs Status (Signed)
 MEDICARE OBSERVATION STATUS NOTIFICATION   Patient Details  Name: TABER SWEETSER MRN: [id number] Date of Birth: [birth year]   Medicare Observation Status Notification Given:  Yes    Gordy Clement, RN 04/23/2023, 9:53 AM

## 2023-04-27 ENCOUNTER — Telehealth: Payer: Self-pay | Admitting: *Deleted

## 2023-04-27 NOTE — Telephone Encounter (Signed)
 Copied from CRM [id number]. Topic: General - Other >> Apr 27, 2023 10:22 AM Geroge Baseman wrote: Reason for CRM: Patient requesting a call back from [place]. Did not specify why. Callback mobile phone [phone]   Patient wanted to sch hospital follow up with Dr. Lynelle Doctor. I made him an appt for 3/6/5 @ 11:00am.

## 2023-04-29 NOTE — Progress Notes (Unsigned)
 No chief complaint on file.  Patient was hospitalized 2/26-2/27/25 due to syncopal episode.  His wife found him unresponsive, wedged between the wall and the toilet. During his hospital stay he had MRI, EEG and echocardiogram, which didn't reveal any etiology.  Mg was found to be very low and was replaced. Suspected to have vasovagal syncope-likely triggered by micturition, though also noticed to have orthostatic vital signs on day of discharge. He was given TED hose. They had discussed his low BP, and discussed alpha blocker and lisinopril.  He was hesitant to stop the alpha blocker (pt/wife reported urologist wanted him to stay on this), so the lisinopril was stopped, allowing for some permissive hypertension for now. Urine microalbumin/Cr ratio was normal in 02/2023.  BP's have been running ***  BP Readings from Last 3 Encounters:  04/22/23 (!) 144/83  03/12/23 120/70  02/19/23 (!) 120/50    Review of hospital studies: MRI brain: IMPRESSION: No acute intracranial process.  EEG: IMPRESSION: This study is suggestive of mild diffuse encephalopathy. No seizures or epileptiform discharges were seen throughout the recording.  Echo: IMPRESSIONS   1. Left ventricular ejection fraction, by estimation, is 50 to 55%. Left  ventricular ejection fraction by 3D volume is 51 %. The left ventricle has  low normal function. The left ventricle has no regional wall motion  abnormalities. Left ventricular  diastolic parameters are indeterminate.   2. Right ventricular systolic function is normal. The right ventricular  size is normal. There is moderately elevated pulmonary artery systolic  pressure. The estimated right ventricular systolic pressure is 48.9 mmHg.   3. Left atrial size was moderately dilated.   4. Right atrial size was moderately dilated.   5. The mitral valve is normal in structure. Mild mitral valve  regurgitation. No evidence of mitral stenosis.   6. Tricuspid valve  regurgitation is moderate.   7. The aortic valve is tricuspid. There is mild calcification of the  aortic valve. Aortic valve regurgitation is mild. Aortic valve  sclerosis/calcification is present, without any evidence of aortic  stenosis. Aortic regurgitation PHT measures 801 msec.  Aortic valve area, by VTI measures 2.77 cm. Aortic valve mean gradient  measures 3.3 mmHg. Aortic valve Vmax measures 1.27 m/s.   8. Aortic dilatation noted. There is mild dilatation of the aortic root,  measuring 40 mm.   9. The inferior vena cava is dilated in size with <50% respiratory  variability, suggesting right atrial pressure of 15 mmHg.   Labs: Magnesium was found to be very low and was replaced. He is due for recheck. Component Ref Range & Units (hover) 6 d ago (04/23/23) 7 d ago (04/22/23) 4 yr ago (01/29/19) 4 yr ago (01/28/19)  Magnesium 1.6 Low  0.9 Low Panic  CM 1.6 Low  CM 1.5 Low  CM   Lab Results  Component Value Date   WBC 9.3 04/23/2023   HGB 12.7 (L) 04/23/2023   HCT 37.3 (L) 04/23/2023   MCV 95.2 04/23/2023   PLT 223 04/23/2023     Chemistry      Component Value Date/Time   NA 138 04/23/2023 0445   NA 139 08/06/2022 1414   K 4.3 04/23/2023 0445   CL 108 04/23/2023 0445   CO2 24 04/23/2023 0445   BUN 16 04/23/2023 0445   BUN 29 (H) 08/06/2022 1414   CREATININE 1.02 04/23/2023 0445   CREATININE 1.07 01/29/2017 1242      Component Value Date/Time   CALCIUM 8.9 04/23/2023 0445  ALKPHOS 61 04/23/2023 0445   AST 20 04/23/2023 0445   ALT 17 04/23/2023 0445   BILITOT 1.1 04/23/2023 0445   BILITOT 0.5 08/06/2022 1414     Lab Results  Component Value Date   HGBA1C 6.6 (H) 04/23/2023   BNP 211.6  Lab Results  Component Value Date   CKTOTAL 97 04/22/2023      PMH, PSH, SH reviewed   All discharge mediations have been reviewed and reconciled with the most current medication list. Daley Gosse Diprima has their current medications, including OTC medications listed in  the chart. Any changes to the medications from today's visit will be noted in the assessment and plan section of this note.   ROS:    PHYSICAL EXAM:  There were no vitals taken for this visit.  Wt Readings from Last 3 Encounters:  04/22/23 192 lb 0.3 oz (87.1 kg)  03/12/23 188 lb (85.3 kg)  02/19/23 194 lb (88 kg)        ASSESSMENT/PLAN:   Cmet, CBC, Mg Check orthostatic VS  ?need to restart lisinopril  Has chronic unsteady gait-has not had major falls yet-cautiously continuing with Eliquis for now-if he were to have frequent falls in the future-this may need to be stopped.

## 2023-04-30 ENCOUNTER — Encounter: Payer: Self-pay | Admitting: Family Medicine

## 2023-04-30 ENCOUNTER — Ambulatory Visit: Admitting: Family Medicine

## 2023-04-30 VITALS — BP 108/64 | HR 64 | Ht 70.0 in | Wt 190.0 lb

## 2023-04-30 DIAGNOSIS — E1129 Type 2 diabetes mellitus with other diabetic kidney complication: Secondary | ICD-10-CM

## 2023-04-30 DIAGNOSIS — I482 Chronic atrial fibrillation, unspecified: Secondary | ICD-10-CM

## 2023-04-30 DIAGNOSIS — R55 Syncope and collapse: Secondary | ICD-10-CM | POA: Diagnosis not present

## 2023-04-30 DIAGNOSIS — N400 Enlarged prostate without lower urinary tract symptoms: Secondary | ICD-10-CM

## 2023-04-30 DIAGNOSIS — I951 Orthostatic hypotension: Secondary | ICD-10-CM

## 2023-04-30 DIAGNOSIS — T148XXA Other injury of unspecified body region, initial encounter: Secondary | ICD-10-CM

## 2023-04-30 DIAGNOSIS — R809 Proteinuria, unspecified: Secondary | ICD-10-CM

## 2023-04-30 NOTE — Patient Instructions (Addendum)
 You still have evidence of significant drop in your blood pressure with standing (based on your visit today--it wasn't bad when checked by the nurse on Friday). The Terazosin that you take contributes to this. I spoke with the PA at [facility], who suggested that you stop BOTH the terazosin and the Trospium. She would like to see you within the next 2 weeks or so. She may start you on another medication at your follow-up, if needed.  Continue to stay off of the lisinopril for now.  I will want you back on that in order to protect your kidneys from the diabetes (you previously had protein in the urine, and this treats/prevents that). It should be fine for you to remain off of this until I see you again in July, as long as your blood pressure is normal. Please continue to monitor your blood pressure at home.  If it is consistently >135-140/85-90, please let us know and we will restart the lisinopril.  Stay well hydrated. Aim for a pale yellow urine. The darker yellow it is, the more water/fluid (non-caffeinated) that you need to drink.  If your aren't well hydrated, you are more likely to have your blood pressure drop, feel more fatigued, and possibly dizzy.  If you no longer have dizziness and the drop in blood pressure when you stand up, you may not need to get the TED hose (since you don't have swelling in your legs).  You may not need it if the medication was the cause for your drop in blood pressure.

## 2023-05-01 ENCOUNTER — Other Ambulatory Visit: Payer: Self-pay | Admitting: Internal Medicine

## 2023-05-01 LAB — CBC WITH DIFFERENTIAL/PLATELET
Basophils Absolute: 0.1 10*3/uL (ref 0.0–0.2)
Basos: 1 %
EOS (ABSOLUTE): 0.2 10*3/uL (ref 0.0–0.4)
Eos: 2 %
Hematocrit: 38.7 % (ref 37.5–51.0)
Hemoglobin: 13.2 g/dL (ref 13.0–17.7)
Immature Grans (Abs): 0 10*3/uL (ref 0.0–0.1)
Immature Granulocytes: 0 %
Lymphocytes Absolute: 1.9 10*3/uL (ref 0.7–3.1)
Lymphs: 24 %
MCH: 33.4 pg — ABNORMAL HIGH (ref 26.6–33.0)
MCHC: 34.1 g/dL (ref 31.5–35.7)
MCV: 98 fL — ABNORMAL HIGH (ref 79–97)
Monocytes Absolute: 0.6 10*3/uL (ref 0.1–0.9)
Monocytes: 8 %
Neutrophils Absolute: 5.1 10*3/uL (ref 1.4–7.0)
Neutrophils: 65 %
Platelets: 300 10*3/uL (ref 150–450)
RBC: 3.95 x10E6/uL — ABNORMAL LOW (ref 4.14–5.80)
RDW: 12.1 % (ref 11.6–15.4)
WBC: 7.9 10*3/uL (ref 3.4–10.8)

## 2023-05-01 LAB — CMP14+EGFR
ALT: 16 IU/L (ref 0–44)
AST: 16 IU/L (ref 0–40)
Albumin: 4.1 g/dL (ref 3.7–4.7)
Alkaline Phosphatase: 111 IU/L (ref 44–121)
BUN/Creatinine Ratio: 21 (ref 10–24)
BUN: 24 mg/dL (ref 8–27)
Bilirubin Total: 0.6 mg/dL (ref 0.0–1.2)
CO2: 22 mmol/L (ref 20–29)
Calcium: 9.8 mg/dL (ref 8.6–10.2)
Chloride: 100 mmol/L (ref 96–106)
Creatinine, Ser: 1.14 mg/dL (ref 0.76–1.27)
Globulin, Total: 2.6 g/dL (ref 1.5–4.5)
Glucose: 187 mg/dL — ABNORMAL HIGH (ref 70–99)
Potassium: 4.5 mmol/L (ref 3.5–5.2)
Sodium: 136 mmol/L (ref 134–144)
Total Protein: 6.7 g/dL (ref 6.0–8.5)
eGFR: 61 mL/min/{1.73_m2} (ref >59)

## 2023-05-01 LAB — MAGNESIUM: Magnesium: 1.1 mg/dL — ABNORMAL LOW (ref 1.6–2.3)

## 2023-05-05 ENCOUNTER — Telehealth: Payer: Self-pay | Admitting: *Deleted

## 2023-05-05 NOTE — Progress Notes (Signed)
 Care Guide Pharmacy Note  05/05/2023 Name: Andre Walker MRN: [id number] DOB: 1932-05-06  Referred By: Joselyn Arrow, MD Reason for referral: No chief complaint on file.   Andre Walker is a 89 y.o. year old male who is a primary care patient of Joselyn Arrow, MD.  [name] was referred to the pharmacist for assistance related to: Hypomagnesemia   Successful contact was made with the patient to discuss pharmacy services including being ready for the pharmacist to call at least 5 minutes before the scheduled appointment time and to have medication bottles and any blood pressure readings ready for review. The patient agreed to meet with the pharmacist via telephone visit on (3/18 at 1300).  [name]  [facility]  [facility] Guide  Direct Dial: [phone]  Fax [phone]

## 2023-05-12 ENCOUNTER — Other Ambulatory Visit

## 2023-05-12 DIAGNOSIS — I951 Orthostatic hypotension: Secondary | ICD-10-CM

## 2023-05-12 NOTE — Progress Notes (Signed)
 05/12/2023 Name: Andre Walker MRN: [id number] DOB: [birth year]  Chief Complaint  Patient presents with   Hypertension   Medication Management    hypomagnesemia    Andre Walker is a 89 y.o. year old male who presented for a telephone visit.   They were referred to the pharmacist by their PCP for assistance in managing complex medication management.    Subjective:  Care Team: Primary Care Provider: Joselyn Arrow, MD ; Next Scheduled Visit: 09/14/23  Medication Access/Adherence  Current Pharmacy:  [facility] [address], [place] - [address] [place] [address] Phone: [phone] Fax: [phone]   Patient reports affordability concerns with their medications: No  Patient reports access/transportation concerns to their pharmacy: No  Patient reports adherence concerns with their medications:  No     Hypertension:  Current medications: None Medications previously tried: Lisinopril (being held since recent hospitalization)  Patient has a validated, automated, upper arm home BP cuff Current blood pressure readings readings: 126/74 sitting and standing today, reporting similar readings, checking daily  Patient denies hypotensive s/sx including dizziness, lightheadedness.  Patient denies hypertensive symptoms including headache, chest pain, shortness of breath  Current meal patterns: staying hydrated with water and gatorade zero sugar   Hypomagnesemia -Current Medications: Mag Oxide 400mg  1 tab BID (taking 1 tab once daily) -Denies any stomach upset -Reports 2 tabs caused upper leg aches in the thigh area and made him feel unsteady on his feet. This resolved once he cut down to 1 tab.    Objective:  Lab Results  Component Value Date   HGBA1C 6.6 (H) 04/23/2023    Lab Results  Component Value Date   CREATININE 1.14 04/30/2023   BUN 24 04/30/2023   NA 136 04/30/2023   K 4.5 04/30/2023   CL 100 04/30/2023    CO2 22 04/30/2023    Lab Results  Component Value Date   CHOL 101 04/23/2023   HDL 41 04/23/2023   LDLCALC 45 04/23/2023   TRIG 76 04/23/2023   CHOLHDL 2.5 04/23/2023    Medications Reviewed Today     Reviewed by Sherrill Raring, RPH (Pharmacist) on 05/12/23 at 1302  Med List Status: <None>   Medication Order Taking? Sig Documenting Provider Last Dose Status Informant  acetaminophen (TYLENOL) 325 MG tablet [id number] Yes Take 325-650 mg by mouth every 6 (six) hours as needed for mild pain (pain score 1-3) or moderate pain (pain score 4-6). [provider] Taking Active Self, Spouse/Significant Other, Pharmacy Records           Med Note Katrinka Blazing, Fredderick Severance Apr 30, 2023 10:45 AM) Rochele Pages 2 this am  apixaban (ELIQUIS) 5 MG TABS tablet [id number] Yes Take 1 tablet by mouth twice daily [place], Peter M, MD Taking Active Self, Spouse/Significant Other, Pharmacy Records  Ascorbic Acid (VITAMIN C PO) [id number] Yes Take 2 tablets by mouth daily. [provider] Taking Active Self, Spouse/Significant Other, Pharmacy Records           Med Note (CRUTHIS, CHLOE C   Wed Apr 22, 2023  8:32 AM)    Calcium Carbonate-Vitamin D 600-400 MG-UNIT tablet [id number] Yes Take 1 tablet by mouth 2 (two) times daily. [provider] Taking Active Self, Spouse/Significant Other, Pharmacy Records  Coenzyme Q10 (COQ10) 100 MG CAPS [id number] Yes Take 100 mg by mouth daily at 12 noon. [provider] Taking Active Self, Spouse/Significant Other, Pharmacy Records  Med Note (CRUTHIS, CHLOE C   Wed Apr 22, 2023  8:32 AM)    fluorouracil (EFUDEX) 5 % cream [id number]  Apply 1 application  topically as needed (wounds).  Patient not taking: Reported on 04/30/2023   [provider]  Active Self, Spouse/Significant Other, Pharmacy Records           Med Note Katrinka Blazing, Fredderick Severance Apr 30, 2023 10:45 AM)    Glucosamine-Chondroitin [facility] DS PO) [id number] Yes Take 1 tablet by  mouth 2 (two) times daily. With lunch & supper [provider] Taking Active Self, Spouse/Significant Other, Pharmacy Records  glucose blood (ONETOUCH VERIO) test strip [id number]  USE 1 STRIP TO CHECK GLUCOSE ONCE DAILY DX:E11.69 Joselyn Arrow, MD  Active Self, Spouse/Significant Other, Pharmacy Records  Lancets [facility] [place]) [place] [id number]  Patient to check blood sugar once daily(usually in the morning). Joselyn Arrow, MD  Active Self, Spouse/Significant Other, Pharmacy Records  loratadine (CLARITIN) 10 MG tablet [id number] Yes Take 10 mg by mouth daily. [provider] Taking Active Self, Spouse/Significant Other, Pharmacy Records  metFORMIN (GLUCOPHAGE-XR) 500 MG 24 hr tablet [id number] Yes TAKE 3 TABLETS BY MOUTH ONCE DAILY WITH BREAKFAST  Patient taking differently: Take by mouth. Take 1000 mg by mouth in the morning and 500 mg at bedtime.   Joselyn Arrow, MD Taking Active Self, Spouse/Significant Other, Pharmacy Records  Multiple Vitamins-Minerals (MULTIVITAMIN WITH MINERALS) tablet [id number] Yes Take 1 tablet by mouth daily. [provider] Taking Active Self, Spouse/Significant Other, Pharmacy Records  Omega-3 Fatty Acids (FISH OIL PO) [id number] Yes Take 1 capsule by mouth daily with supper. [provider] Taking Active Self, Spouse/Significant Other, Pharmacy Records  simvastatin (ZOCOR) 20 MG tablet [id number] Yes Take 1 tablet (20 mg total) by mouth at bedtime. Joselyn Arrow, MD Taking Active Self, Spouse/Significant Other, Pharmacy Records              Assessment/Plan:   Hypertension: - Currently controlled - Reviewed long term cardiovascular and renal outcomes of uncontrolled blood pressure - Reviewed appropriate blood pressure monitoring technique and reviewed goal blood pressure. Recommended to check home blood pressure and heart rate daily, sitting and standing - Recommend to continue to hold Lisinopril -Seeing urologist 3/24 to  discuss alternatives to terazosin and trospium that have been d/c.    Hypomagnesemia: -Continue current medication therapy, notify our office if any other concerns arise with taking medication so we can discuss alternatives if needed -Magnesium recheck scheduled for July  Follow Up Plan: 06/02/23  Sherrill Raring, PharmD Clinical Pharmacist [phone]

## 2023-05-18 ENCOUNTER — Ambulatory Visit (INDEPENDENT_AMBULATORY_CARE_PROVIDER_SITE_OTHER): Admitting: Family Medicine

## 2023-05-18 ENCOUNTER — Encounter: Payer: Self-pay | Admitting: Family Medicine

## 2023-05-18 DIAGNOSIS — M79651 Pain in right thigh: Secondary | ICD-10-CM

## 2023-05-18 DIAGNOSIS — R200 Anesthesia of skin: Secondary | ICD-10-CM

## 2023-05-18 DIAGNOSIS — I951 Orthostatic hypotension: Secondary | ICD-10-CM | POA: Diagnosis not present

## 2023-05-18 DIAGNOSIS — Z5181 Encounter for therapeutic drug level monitoring: Secondary | ICD-10-CM

## 2023-05-18 DIAGNOSIS — M79652 Pain in left thigh: Secondary | ICD-10-CM

## 2023-05-18 NOTE — Progress Notes (Signed)
 Chief Complaint  Patient presents with   Leg Pain    Has soreness in upper leg area and numbness legs/feet which is causing difficulty with his walking. Has appt with [facility] today at 1:15pm. Getting up 5 times a night since being off lisinopril and terazosin and being on trospium. Currently doing PT twice a week @ [facility].    He started having pain in both thighs (described as an ache, not a cramp or spasm), and feeling numb mainly on the bottom of both feet. He reports noticing this ever since he started the Mg supplement.  Denies pain in the feet, no burning. The thigh ache and feet tingling occur at the same time, intermittent.  Most severe of his symptoms occurred on Saturday.  He felt like he might fall while in line at the grocery store, and walking back to the car. Currently has slight tingling in feet, no discomfort in thighs Uses walker most of the time--feels more stable. Hasn't felt weak or dizzy.  He has been taking Mg just 1/day (had been advised to take 2). Mg level was low at 1.1  on 04/30/23. Due for recheck today.  BP's have  been 127-156/64-82 (sitting), mostly 140's/70. Standing BP's were also checked, and tended to be the same or slightly higher (up to 164 systolic). Only once dropped from 144 to 128.   Some pulses remained same sitting and standing, sometimes up 5-10 points, rarely more.  He is NOT taking trospium as reported in nursing CC above.  Has f/u appointment with urology today to further discuss his symptoms (both bladder/prostate meds stopped at last visit due to orthostatic hypotension). Urinary symptoms worsened since meds stopped, but no longer dizzy/orthostatic.  Sees urologist this afternoon.    PMH, PSH, SH reviewed  Outpatient Encounter Medications as of 05/18/2023  Medication Sig Note   apixaban (ELIQUIS) 5 MG TABS tablet Take 1 tablet by mouth twice daily    Ascorbic Acid (VITAMIN C PO) Take 2 tablets by mouth daily.    Calcium  Carbonate-Vitamin D 600-400 MG-UNIT tablet Take 1 tablet by mouth 2 (two) times daily.    Coenzyme Q10 (COQ10) 100 MG CAPS Take 100 mg by mouth daily at 12 noon.    Glucosamine-Chondroitin (COSAMIN DS PO) Take 1 tablet by mouth 2 (two) times daily. With lunch & supper    glucose blood (ONETOUCH VERIO) test strip USE 1 STRIP TO CHECK GLUCOSE ONCE DAILY DX:E11.69    Lancets (ONETOUCH DELICA PLUS LANCET33G) MISC Patient to check blood sugar once daily(usually in the morning).    loratadine (CLARITIN) 10 MG tablet Take 10 mg by mouth daily.    magnesium oxide (MAG-OX) 400 (240 Mg) MG tablet Take 400 mg by mouth daily. 05/18/2023: Taking 1 tablet daily   metFORMIN (GLUCOPHAGE-XR) 500 MG 24 hr tablet TAKE 3 TABLETS BY MOUTH ONCE DAILY WITH BREAKFAST (Patient taking differently: Take by mouth. Take 1000 mg by mouth in the morning and 500 mg at bedtime.)    Multiple Vitamins-Minerals (MULTIVITAMIN WITH MINERALS) tablet Take 1 tablet by mouth daily.    Omega-3 Fatty Acids (FISH OIL PO) Take 1 capsule by mouth daily with supper.    simvastatin (ZOCOR) 20 MG tablet Take 1 tablet (20 mg total) by mouth at bedtime.    acetaminophen (TYLENOL) 325 MG tablet Take 325-650 mg by mouth every 6 (six) hours as needed for mild pain (pain score 1-3) or moderate pain (pain score 4-6). (Patient not taking: Reported on 05/18/2023)  05/18/2023: As needed   fluorouracil (EFUDEX) 5 % cream Apply 1 application  topically as needed (wounds). (Patient not taking: Reported on 04/30/2023) 05/18/2023: As needed   No facility-administered encounter medications on file as of 05/18/2023.   Allergies  Allergen Reactions   Penicillins Rash    ROS: no f/c, URI symptoms or allergies. No HA, dizziness, chest pain, SOB. No n/v.  Has noted some diarrhea, usually after certain foods, not all the time. Up 5x/night to void.  He has been sleeping more during the day. Denies dysuria.  Isn't sure that he is emptying well (doesn't void much in the  morning, fine other times of the day). Some dribbling at night. Bilateral anterior thigh ache and numbness on bottom of feet intermittently, per HPI. No edema, leg swelling    PHYSICAL EXAM:  BP 130/74   Pulse 64   Ht 5\' 10"  (1.778 m)   Wt 186 lb (84.4 kg)   BMI 26.69 kg/m   Wt Readings from Last 3 Encounters:  05/18/23 186 lb (84.4 kg)  04/30/23 190 lb (86.2 kg)  04/22/23 192 lb 0.3 oz (87.1 kg)   Pleasant, elderly, well-appearing male in no distress. Accompanied by his wife. Brought cane today, rather than walker HEENT: conjunctiva and sclera are clear, EOMI Neck:no lymphadenopathy, thyromegaly or mass Heart: regular rate and rhythm Lungs: clear bilaterally Back: no spinal or CVA tenderness Abdomen: soft, nontender, no mass Extremities: 2+ pulses, no edema.  Nontender at thighs, calves.  Socks/shoes not removed. Neuro: alert and oriented, cranial nerves grossly intact.   ASSESSMENT/PLAN:  Hypomagnesemia - due for recheck. Only taking 1/day - Plan: Magnesium  Medication monitoring encounter - Plan: Magnesium, Basic metabolic panel  Pain in both thighs - intermittent.  Not related to exertion/walking (worse 1x with standing/walking). No spasm or weakness. Check electrolytes  Orthostatic hypotension - this has improved since stopping alpha blocker. Has f/u with urologist today on his urinary/bladder symptoms.  Numbness in feet - intermittent. Poss related to DM; not painful. Discussed f/u if consistent pain with exertion, or any weakness

## 2023-05-19 ENCOUNTER — Inpatient Hospital Stay (HOSPITAL_COMMUNITY)

## 2023-05-19 ENCOUNTER — Other Ambulatory Visit: Payer: Self-pay

## 2023-05-19 ENCOUNTER — Inpatient Hospital Stay (HOSPITAL_COMMUNITY)
Admission: EM | Admit: 2023-05-19 | Discharge: 2023-05-27 | DRG: 100 | Disposition: A | Discharge status: Skilled Nursing Facility | Attending: Internal Medicine | Admitting: Internal Medicine

## 2023-05-19 ENCOUNTER — Emergency Department (HOSPITAL_COMMUNITY)

## 2023-05-19 ENCOUNTER — Encounter (HOSPITAL_COMMUNITY): Payer: Self-pay

## 2023-05-19 DIAGNOSIS — Z7189 Other specified counseling: Secondary | ICD-10-CM | POA: Diagnosis not present

## 2023-05-19 DIAGNOSIS — Z8249 Family history of ischemic heart disease and other diseases of the circulatory system: Secondary | ICD-10-CM

## 2023-05-19 DIAGNOSIS — R55 Syncope and collapse: Secondary | ICD-10-CM | POA: Diagnosis not present

## 2023-05-19 DIAGNOSIS — F05 Delirium due to known physiological condition: Secondary | ICD-10-CM | POA: Diagnosis not present

## 2023-05-19 DIAGNOSIS — N390 Urinary tract infection, site not specified: Secondary | ICD-10-CM | POA: Diagnosis present

## 2023-05-19 DIAGNOSIS — I480 Paroxysmal atrial fibrillation: Secondary | ICD-10-CM | POA: Diagnosis present

## 2023-05-19 DIAGNOSIS — Z833 Family history of diabetes mellitus: Secondary | ICD-10-CM | POA: Diagnosis not present

## 2023-05-19 DIAGNOSIS — R54 Age-related physical debility: Secondary | ICD-10-CM | POA: Diagnosis present

## 2023-05-19 DIAGNOSIS — J9601 Acute respiratory failure with hypoxia: Secondary | ICD-10-CM | POA: Diagnosis present

## 2023-05-19 DIAGNOSIS — J159 Unspecified bacterial pneumonia: Secondary | ICD-10-CM | POA: Diagnosis present

## 2023-05-19 DIAGNOSIS — R001 Bradycardia, unspecified: Secondary | ICD-10-CM | POA: Diagnosis present

## 2023-05-19 DIAGNOSIS — Z7984 Long term (current) use of oral hypoglycemic drugs: Secondary | ICD-10-CM | POA: Diagnosis not present

## 2023-05-19 DIAGNOSIS — J95851 Ventilator associated pneumonia: Secondary | ICD-10-CM | POA: Diagnosis present

## 2023-05-19 DIAGNOSIS — E7800 Pure hypercholesterolemia, unspecified: Secondary | ICD-10-CM | POA: Diagnosis present

## 2023-05-19 DIAGNOSIS — I4891 Unspecified atrial fibrillation: Secondary | ICD-10-CM

## 2023-05-19 DIAGNOSIS — Z515 Encounter for palliative care: Secondary | ICD-10-CM

## 2023-05-19 DIAGNOSIS — Z79899 Other long term (current) drug therapy: Secondary | ICD-10-CM

## 2023-05-19 DIAGNOSIS — G9341 Metabolic encephalopathy: Secondary | ICD-10-CM | POA: Diagnosis present

## 2023-05-19 DIAGNOSIS — E861 Hypovolemia: Secondary | ICD-10-CM | POA: Diagnosis present

## 2023-05-19 DIAGNOSIS — E119 Type 2 diabetes mellitus without complications: Secondary | ICD-10-CM

## 2023-05-19 DIAGNOSIS — Z85820 Personal history of malignant melanoma of skin: Secondary | ICD-10-CM

## 2023-05-19 DIAGNOSIS — N179 Acute kidney failure, unspecified: Secondary | ICD-10-CM | POA: Diagnosis not present

## 2023-05-19 DIAGNOSIS — E1129 Type 2 diabetes mellitus with other diabetic kidney complication: Secondary | ICD-10-CM | POA: Diagnosis present

## 2023-05-19 DIAGNOSIS — Z1152 Encounter for screening for COVID-19: Secondary | ICD-10-CM | POA: Diagnosis not present

## 2023-05-19 DIAGNOSIS — N400 Enlarged prostate without lower urinary tract symptoms: Secondary | ICD-10-CM | POA: Diagnosis present

## 2023-05-19 DIAGNOSIS — R569 Unspecified convulsions: Principal | ICD-10-CM | POA: Diagnosis present

## 2023-05-19 DIAGNOSIS — Z7901 Long term (current) use of anticoagulants: Secondary | ICD-10-CM | POA: Diagnosis not present

## 2023-05-19 DIAGNOSIS — J690 Pneumonitis due to inhalation of food and vomit: Secondary | ICD-10-CM | POA: Diagnosis present

## 2023-05-19 DIAGNOSIS — Z860100 Personal history of colon polyps, unspecified: Secondary | ICD-10-CM

## 2023-05-19 DIAGNOSIS — Z974 Presence of external hearing-aid: Secondary | ICD-10-CM

## 2023-05-19 DIAGNOSIS — I959 Hypotension, unspecified: Secondary | ICD-10-CM | POA: Diagnosis not present

## 2023-05-19 DIAGNOSIS — Z96653 Presence of artificial knee joint, bilateral: Secondary | ICD-10-CM | POA: Diagnosis present

## 2023-05-19 DIAGNOSIS — R509 Fever, unspecified: Secondary | ICD-10-CM | POA: Diagnosis not present

## 2023-05-19 DIAGNOSIS — I482 Chronic atrial fibrillation, unspecified: Secondary | ICD-10-CM | POA: Diagnosis present

## 2023-05-19 DIAGNOSIS — J9600 Acute respiratory failure, unspecified whether with hypoxia or hypercapnia: Secondary | ICD-10-CM

## 2023-05-19 DIAGNOSIS — Z66 Do not resuscitate: Secondary | ICD-10-CM | POA: Diagnosis present

## 2023-05-19 DIAGNOSIS — R4182 Altered mental status, unspecified: Secondary | ICD-10-CM | POA: Diagnosis not present

## 2023-05-19 DIAGNOSIS — Z711 Person with feared health complaint in whom no diagnosis is made: Secondary | ICD-10-CM | POA: Diagnosis not present

## 2023-05-19 DIAGNOSIS — K219 Gastro-esophageal reflux disease without esophagitis: Secondary | ICD-10-CM | POA: Diagnosis present

## 2023-05-19 DIAGNOSIS — Z880 Allergy status to penicillin: Secondary | ICD-10-CM

## 2023-05-19 LAB — DIFFERENTIAL
Abs Immature Granulocytes: 0.05 10*3/uL (ref 0.00–0.07)
Basophils Absolute: 0 10*3/uL (ref 0.0–0.1)
Basophils Relative: 0 %
Eosinophils Absolute: 0.3 10*3/uL (ref 0.0–0.5)
Eosinophils Relative: 2 %
Immature Granulocytes: 0 %
Lymphocytes Relative: 26 %
Lymphs Abs: 3 10*3/uL (ref 0.7–4.0)
Monocytes Absolute: 0.8 10*3/uL (ref 0.1–1.0)
Monocytes Relative: 7 %
Neutro Abs: 7.3 10*3/uL (ref 1.7–7.7)
Neutrophils Relative %: 65 %

## 2023-05-19 LAB — I-STAT ARTERIAL BLOOD GAS, ED
Acid-base deficit: 7 mmol/L — ABNORMAL HIGH (ref 0.0–2.0)
Bicarbonate: 20.1 mmol/L (ref 20.0–28.0)
Calcium, Ion: 1.27 mmol/L (ref 1.15–1.40)
HCT: 38 % — ABNORMAL LOW (ref 39.0–52.0)
Hemoglobin: 12.9 g/dL — ABNORMAL LOW (ref 13.0–17.0)
O2 Saturation: 99 %
Potassium: 4.2 mmol/L (ref 3.5–5.1)
Sodium: 137 mmol/L (ref 135–145)
TCO2: 22 mmol/L (ref 22–32)
pCO2 arterial: 45.9 mmHg (ref 32–48)
pH, Arterial: 7.25 — ABNORMAL LOW (ref 7.35–7.45)
pO2, Arterial: 163 mmHg — ABNORMAL HIGH (ref 83–108)

## 2023-05-19 LAB — POCT I-STAT 7, (LYTES, BLD GAS, ICA,H+H)
Acid-base deficit: 3 mmol/L — ABNORMAL HIGH (ref 0.0–2.0)
Bicarbonate: 23 mmol/L (ref 20.0–28.0)
Calcium, Ion: 1.26 mmol/L (ref 1.15–1.40)
HCT: 38 % — ABNORMAL LOW (ref 39.0–52.0)
Hemoglobin: 12.9 g/dL — ABNORMAL LOW (ref 13.0–17.0)
O2 Saturation: 99 %
Patient temperature: 97.7
Potassium: 4.3 mmol/L (ref 3.5–5.1)
Sodium: 137 mmol/L (ref 135–145)
TCO2: 24 mmol/L (ref 22–32)
pCO2 arterial: 43.5 mmHg (ref 32–48)
pH, Arterial: 7.328 — ABNORMAL LOW (ref 7.35–7.45)
pO2, Arterial: 164 mmHg — ABNORMAL HIGH (ref 83–108)

## 2023-05-19 LAB — URINALYSIS, ROUTINE W REFLEX MICROSCOPIC
Bacteria, UA: NONE SEEN
Bilirubin Urine: NEGATIVE
Bilirubin Urine: NEGATIVE
Glucose, UA: NEGATIVE mg/dL
Glucose, UA: NEGATIVE mg/dL
Ketones, ur: NEGATIVE mg/dL
Ketones, ur: NEGATIVE mg/dL
Leukocytes,Ua: NEGATIVE
Leukocytes,Ua: NEGATIVE
Nitrite: NEGATIVE
Nitrite: NEGATIVE
Protein, ur: 30 mg/dL — AB
Protein, ur: 30 mg/dL — AB
Specific Gravity, Urine: 1.017 (ref 1.005–1.030)
Specific Gravity, Urine: 1.024 (ref 1.005–1.030)
pH: 5 (ref 5.0–8.0)
pH: 5 (ref 5.0–8.0)

## 2023-05-19 LAB — BASIC METABOLIC PANEL
Anion gap: 8 (ref 5–15)
BUN/Creatinine Ratio: 28 — ABNORMAL HIGH (ref 10–24)
BUN: 26 mg/dL (ref 8–27)
BUN: 21 mg/dL (ref 8–23)
CO2: 22 mmol/L (ref 20–29)
CO2: 22 mmol/L (ref 22–32)
Calcium: 9.8 mg/dL (ref 8.6–10.2)
Calcium: 8.8 mg/dL — ABNORMAL LOW (ref 8.9–10.3)
Chloride: 103 mmol/L (ref 96–106)
Chloride: 106 mmol/L (ref 98–111)
Creatinine, Ser: 0.94 mg/dL (ref 0.76–1.27)
Creatinine, Ser: 1 mg/dL (ref 0.61–1.24)
GFR, Estimated: >60 mL/min (ref >60)
Glucose, Bld: 113 mg/dL — ABNORMAL HIGH (ref 70–99)
Glucose: 143 mg/dL — ABNORMAL HIGH (ref 70–99)
Potassium: 4.6 mmol/L (ref 3.5–5.2)
Potassium: 4.3 mmol/L (ref 3.5–5.1)
Sodium: 141 mmol/L (ref 134–144)
Sodium: 136 mmol/L (ref 135–145)
eGFR: 77 mL/min/{1.73_m2} (ref >59)

## 2023-05-19 LAB — COMPREHENSIVE METABOLIC PANEL
ALT: 21 U/L (ref 0–44)
AST: 26 U/L (ref 15–41)
Albumin: 4 g/dL (ref 3.5–5.0)
Alkaline Phosphatase: 84 U/L (ref 38–126)
Anion gap: 21 — ABNORMAL HIGH (ref 5–15)
BUN: 22 mg/dL (ref 8–23)
CO2: 15 mmol/L — ABNORMAL LOW (ref 22–32)
Calcium: 9.2 mg/dL (ref 8.9–10.3)
Chloride: 101 mmol/L (ref 98–111)
Creatinine, Ser: 1.29 mg/dL — ABNORMAL HIGH (ref 0.61–1.24)
GFR, Estimated: 53 mL/min — ABNORMAL LOW (ref >60)
Glucose, Bld: 226 mg/dL — ABNORMAL HIGH (ref 70–99)
Potassium: 3.9 mmol/L (ref 3.5–5.1)
Sodium: 137 mmol/L (ref 135–145)
Total Bilirubin: 1 mg/dL (ref 0.0–1.2)
Total Protein: 7.1 g/dL (ref 6.5–8.1)

## 2023-05-19 LAB — CBC
HCT: 44.7 % (ref 39.0–52.0)
Hemoglobin: 14.1 g/dL (ref 13.0–17.0)
MCH: 32.3 pg (ref 26.0–34.0)
MCHC: 31.5 g/dL (ref 30.0–36.0)
MCV: 102.3 fL — ABNORMAL HIGH (ref 80.0–100.0)
Platelets: 286 10*3/uL (ref 150–400)
RBC: 4.37 MIL/uL (ref 4.22–5.81)
RDW: 12.2 % (ref 11.5–15.5)
WBC: 11.4 10*3/uL — ABNORMAL HIGH (ref 4.0–10.5)
nRBC: 0 % (ref 0.0–0.2)

## 2023-05-19 LAB — RAPID URINE DRUG SCREEN, HOSP PERFORMED
Amphetamines: NOT DETECTED
Barbiturates: NOT DETECTED
Benzodiazepines: POSITIVE — AB
Cocaine: NOT DETECTED
Opiates: NOT DETECTED
Tetrahydrocannabinol: NOT DETECTED

## 2023-05-19 LAB — APTT: aPTT: 25 s (ref 24–36)

## 2023-05-19 LAB — PROTIME-INR
INR: 1.2 (ref 0.8–1.2)
Prothrombin Time: 15.1 s (ref 11.4–15.2)

## 2023-05-19 LAB — GLUCOSE, CAPILLARY
Glucose-Capillary: 191 mg/dL — ABNORMAL HIGH (ref 70–99)
Glucose-Capillary: 115 mg/dL — ABNORMAL HIGH (ref 70–99)
Glucose-Capillary: 133 mg/dL — ABNORMAL HIGH (ref 70–99)
Glucose-Capillary: 114 mg/dL — ABNORMAL HIGH (ref 70–99)

## 2023-05-19 LAB — LACTIC ACID, PLASMA
Lactic Acid, Venous: 3.6 mmol/L (ref 0.5–1.9)
Lactic Acid, Venous: 3.2 mmol/L (ref 0.5–1.9)

## 2023-05-19 LAB — I-STAT CHEM 8, ED
BUN: 23 mg/dL (ref 8–23)
Calcium, Ion: 1.19 mmol/L (ref 1.15–1.40)
Chloride: 103 mmol/L (ref 98–111)
Creatinine, Ser: 1.1 mg/dL (ref 0.61–1.24)
Glucose, Bld: 217 mg/dL — ABNORMAL HIGH (ref 70–99)
HCT: 44 % (ref 39.0–52.0)
Hemoglobin: 15 g/dL (ref 13.0–17.0)
Potassium: 3.9 mmol/L (ref 3.5–5.1)
Sodium: 138 mmol/L (ref 135–145)
TCO2: 16 mmol/L — ABNORMAL LOW (ref 22–32)

## 2023-05-19 LAB — ETHANOL: Alcohol, Ethyl (B): <10 mg/dL (ref <10)

## 2023-05-19 LAB — MAGNESIUM
Magnesium: 1.3 mg/dL — ABNORMAL LOW (ref 1.6–2.3)
Magnesium: 1.4 mg/dL — ABNORMAL LOW (ref 1.7–2.4)

## 2023-05-19 LAB — TSH: TSH: 5.072 u[IU]/mL — ABNORMAL HIGH (ref 0.350–4.500)

## 2023-05-19 LAB — MRSA NEXT GEN BY PCR, NASAL: MRSA by PCR Next Gen: NOT DETECTED

## 2023-05-19 LAB — CBG MONITORING, ED: Glucose-Capillary: 219 mg/dL — ABNORMAL HIGH (ref 70–99)

## 2023-05-19 MED ORDER — SUCCINYLCHOLINE CHLORIDE 200 MG/10ML IV SOSY
140.0000 mg | PREFILLED_SYRINGE | Freq: Once | INTRAVENOUS | Status: AC
  Administered 2023-05-19: 140 mg via INTRAVENOUS

## 2023-05-19 MED ORDER — CHLORHEXIDINE GLUCONATE CLOTH 2 % EX PADS
6.0000 | MEDICATED_PAD | Freq: Every day | CUTANEOUS | Status: DC
  Administered 2023-05-19 – 2023-05-27 (×8): 6 via TOPICAL

## 2023-05-19 MED ORDER — SODIUM CHLORIDE 0.9 % IV SOLN: INTRAVENOUS | Status: AC

## 2023-05-19 MED ORDER — LEVETIRACETAM IN NACL 500 MG/100ML IV SOLN
500.0000 mg | Freq: Two times a day (BID) | INTRAVENOUS | Status: DC
  Administered 2023-05-19 – 2023-05-21 (×4): 500 mg via INTRAVENOUS
  Filled 2023-05-19 (×4): qty 100

## 2023-05-19 MED ORDER — HEPARIN SODIUM (PORCINE) 5000 UNIT/ML IJ SOLN
5000.0000 [IU] | Freq: Three times a day (TID) | INTRAMUSCULAR | Status: DC
Start: 2023-05-19 — End: 2023-05-20
  Administered 2023-05-19 – 2023-05-20 (×3): 5000 [IU] via SUBCUTANEOUS
  Filled 2023-05-19 (×3): qty 1

## 2023-05-19 MED ORDER — DOCUSATE SODIUM 100 MG PO CAPS
100.0000 mg | ORAL_CAPSULE | Freq: Two times a day (BID) | ORAL | Status: DC | PRN
  Administered 2023-05-22: 100 mg via ORAL
  Filled 2023-05-19: qty 1

## 2023-05-19 MED ORDER — ACETAMINOPHEN 325 MG PO TABS
650.0000 mg | ORAL_TABLET | Freq: Four times a day (QID) | ORAL | Status: DC | PRN
  Filled 2023-05-19: qty 2

## 2023-05-19 MED ORDER — IBUPROFEN 100 MG/5ML PO SUSP
600.0000 mg | Freq: Four times a day (QID) | ORAL | Status: DC | PRN
  Administered 2023-05-19: 600 mg
  Filled 2023-05-19: qty 30

## 2023-05-19 MED ORDER — ETOMIDATE 2 MG/ML IV SOLN
20.0000 mg | Freq: Once | INTRAVENOUS | Status: AC
  Administered 2023-05-19: 20 mg via INTRAVENOUS

## 2023-05-19 MED ORDER — ORAL CARE MOUTH RINSE
15.0000 mL | OROMUCOSAL | Status: DC
  Administered 2023-05-19 – 2023-05-20 (×14): 15 mL via OROMUCOSAL

## 2023-05-19 MED ORDER — IOHEXOL 350 MG/ML SOLN
75.0000 mL | Freq: Once | INTRAVENOUS | Status: AC | PRN
  Administered 2023-05-19: 75 mL via INTRAVENOUS

## 2023-05-19 MED ORDER — ACETAMINOPHEN 325 MG PO TABS
650.0000 mg | ORAL_TABLET | Freq: Four times a day (QID) | ORAL | Status: DC | PRN
  Administered 2023-05-19 – 2023-05-20 (×2): 650 mg
  Filled 2023-05-19 (×2): qty 2

## 2023-05-19 MED ORDER — ATROPINE SULFATE 1 MG/10ML IJ SOSY
PREFILLED_SYRINGE | INTRAMUSCULAR | Status: AC
  Filled 2023-05-19: qty 10

## 2023-05-19 MED ORDER — INSULIN ASPART 100 UNIT/ML IJ SOLN
0.0000 [IU] | INTRAMUSCULAR | Status: DC
  Administered 2023-05-19: 2 [IU] via SUBCUTANEOUS
  Administered 2023-05-19: 3 [IU] via SUBCUTANEOUS
  Administered 2023-05-20 – 2023-05-21 (×4): 2 [IU] via SUBCUTANEOUS
  Administered 2023-05-21 – 2023-05-22 (×4): 3 [IU] via SUBCUTANEOUS
  Administered 2023-05-22: 2 [IU] via SUBCUTANEOUS
  Administered 2023-05-22: 7 [IU] via SUBCUTANEOUS
  Administered 2023-05-23: 2 [IU] via SUBCUTANEOUS
  Administered 2023-05-23: 3 [IU] via SUBCUTANEOUS
  Administered 2023-05-23: 2 [IU] via SUBCUTANEOUS
  Administered 2023-05-23 (×2): 3 [IU] via SUBCUTANEOUS

## 2023-05-19 MED ORDER — ORAL CARE MOUTH RINSE: 15.0000 mL | OROMUCOSAL | Status: DC | PRN

## 2023-05-19 MED ORDER — LEVETIRACETAM IN NACL 1000 MG/100ML IV SOLN
2000.0000 mg | Freq: Once | INTRAVENOUS | Status: AC
  Administered 2023-05-19: 2000 mg via INTRAVENOUS

## 2023-05-19 MED ORDER — SODIUM CHLORIDE 0.9 % IV SOLN: Freq: Once | INTRAVENOUS | Status: AC

## 2023-05-19 MED ORDER — LEVETIRACETAM IN NACL 1000 MG/100ML IV SOLN
1000.0000 mg | Freq: Once | INTRAVENOUS | Status: DC
  Administered 2023-05-19: 1000 mg via INTRAVENOUS

## 2023-05-19 MED ORDER — FENTANYL 2500MCG IN NS 250ML (10MCG/ML) PREMIX INFUSION
25.0000 ug/h | INTRAVENOUS | Status: DC
  Administered 2023-05-19: 50 ug/h via INTRAVENOUS
  Filled 2023-05-19 (×2): qty 250

## 2023-05-19 MED ORDER — DEXMEDETOMIDINE HCL IN NACL 400 MCG/100ML IV SOLN
0.0000 ug/kg/h | INTRAVENOUS | Status: DC
Start: 2023-05-19 — End: 2023-05-20
  Administered 2023-05-19: 0.4 ug/kg/h via INTRAVENOUS
  Administered 2023-05-20 (×2): 0.6 ug/kg/h via INTRAVENOUS
  Filled 2023-05-19 (×3): qty 100

## 2023-05-19 MED ORDER — PROPOFOL 1000 MG/100ML IV EMUL
0.0000 ug/kg/min | INTRAVENOUS | Status: DC
  Administered 2023-05-19 (×2): 10 ug/kg/min via INTRAVENOUS
  Administered 2023-05-20: 5 ug/kg/min via INTRAVENOUS
  Filled 2023-05-19 (×2): qty 100

## 2023-05-19 MED ORDER — FAMOTIDINE 20 MG PO TABS: 20.0000 mg | ORAL_TABLET | Freq: Two times a day (BID) | ORAL | Status: DC

## 2023-05-19 MED ORDER — FENTANYL BOLUS VIA INFUSION
25.0000 ug | INTRAVENOUS | Status: DC | PRN
  Administered 2023-05-19: 25 ug via INTRAVENOUS
  Administered 2023-05-19 – 2023-05-20 (×6): 50 ug via INTRAVENOUS
  Administered 2023-05-20: 25 ug via INTRAVENOUS
  Administered 2023-05-20 (×3): 50 ug via INTRAVENOUS
  Administered 2023-05-20: 100 ug via INTRAVENOUS

## 2023-05-19 MED ORDER — POLYETHYLENE GLYCOL 3350 17 G PO PACK
17.0000 g | PACK | Freq: Every day | ORAL | Status: DC | PRN
  Administered 2023-05-22: 17 g via ORAL
  Filled 2023-05-19: qty 1

## 2023-05-19 MED ORDER — SODIUM CHLORIDE 0.9 % IV BOLUS
500.0000 mL | Freq: Once | INTRAVENOUS | Status: AC
  Administered 2023-05-19: 500 mL via INTRAVENOUS

## 2023-05-19 MED ORDER — FAMOTIDINE 20 MG PO TABS
20.0000 mg | ORAL_TABLET | Freq: Every day | ORAL | Status: DC
  Administered 2023-05-19 – 2023-05-20 (×2): 20 mg
  Filled 2023-05-19 (×2): qty 1

## 2023-05-19 MED ORDER — SODIUM CHLORIDE 0.9 % IV SOLN
1.0000 g | INTRAVENOUS | Status: AC
  Administered 2023-05-19 – 2023-05-23 (×5): 1 g via INTRAVENOUS
  Filled 2023-05-19 (×5): qty 10

## 2023-05-19 NOTE — ED Provider Notes (Signed)
  Physical Exam  Ht 5\' 10"  (1.778 m)   Wt 84.4 kg   BMI 26.69 kg/m   Physical Exam Vitals and nursing note reviewed.  Constitutional:      General: He is not in acute distress.    Appearance: He is not toxic-appearing.  HENT:     Head: Normocephalic and atraumatic.  Pulmonary:     Effort: No respiratory distress.  Skin:    Coloration: Skin is not jaundiced or pale.  Neurological:     Mental Status: He is unresponsive.     GCS: GCS eye subscore is 1. GCS verbal subscore is 1. GCS motor subscore is 1.  Psychiatric:        Behavior: Behavior normal.     Procedures  Date/Time: 05/19/2023 9:14 AM  Performed by: Al Decant, PA-C   INTUBATION Performed by: Al Decant  Required items: required blood products, implants, devices, and special equipment available Patient identity confirmed: provided demographic data and hospital-assigned identification number Time out: Immediately prior to procedure a "time out" was called to verify the correct patient, procedure, equipment, support staff and site/side marked as required.  Indications: Airway protection, oxygenation  Intubation method: Glidescope Laryngoscopy   Preoxygenation: BVM  Sedatives: Etomidate Paralytic: Succinylcholine  Tube Size:  cuffed  Post-procedure assessment: chest rise and ETCO2 monitor Breath sounds: equal and absent over the epigastrium Tube secured with: ETT holder Chest x-ray interpreted by radiologist and me.  Chest x-ray findings: endotracheal tube in appropriate position  Patient tolerated the procedure well with no immediate complications.    ED Course / MDM    Medical Decision Making Amount and/or Complexity of Data Reviewed Labs: ordered. Radiology: ordered.  Risk Prescription drug management.          Al Decant, PA-C 05/19/23 0915    Lorre Nick, MD 05/20/23 1020

## 2023-05-19 NOTE — TOC CM/SW Note (Signed)
 Transition of Care [facility]) - Inpatient Brief Assessment   Patient Details  Name: Andre Walker MRN: [id number] Date of Birth: 13-Sep-1932  [facility] CM/SW Contact:    Tom-Johnson, Hershal Coria, RN Phone Number: 05/19/2023, 2:01 PM   Clinical Narrative:  Patient presented to the ED after being Altered at home and had a sudden onset of Seizure activity. Patient had a recurring Seizure with EMS during transport with L gaze. Patient was intubated for Airway protection. Patient is currently intubated and sedated. On continuous EEG, Neurology following.   Patient not Medically ready for discharge.  CM will continue to follow as patient progresses with care towards discharge.          Transition of Care Asessment:

## 2023-05-19 NOTE — Consult Note (Addendum)
 NEUROLOGY CONSULT NOTE   Date of service: May 19, 2023 Patient Name: Andre Walker MRN:  [id number] DOB:  Sep 29, 1932 Chief Complaint: "Seizure" Requesting Provider: Lorre Nick, MD  History of Present Illness  Andre Walker is a 89 y.o. male with hx of a single previous episode of unresponsiveness after he was found down being wedged between the commode and the wall.  He had been doing well since his admission in February, but then had abrupt change this morning.  He got up, and was planning on making coffee, but was acting somewhat confused.  He did not make it into make the coffee, because he was acting funny and his wife helped him to get back to the bed, where he had abrupt onset of convulsive activity.  EMS was called, and in transport he had recurrent seizure activity with left gaze deviation.  He was given Versed 5 mg.  He had cessation of seizure activity, but continued to not be protecting his airway and therefore was intubated.   Past History   Past Medical History:  Diagnosis Date   Actinic keratosis    Dr. Terri Piedra   Atrial fibrillation [facility])    BPH (benign prostatic hyperplasia)    controlled on Hytrin   Colon polyps    Dr. Ewing Schlein   Diabetes mellitus 2004   Diverticulosis    ED (erectile dysfunction)    Gallstones    Hearing loss    high frequency   Hiatal hernia    small, noted on EGD 10/2010   LAE (left atrial enlargement) 01/2019   Moderate, Noted on ECHO   Melanoma (HCC)    right arm   Microalbuminuria    on ACEI for microalbuminuria (does NOT have HTN)   Pneumonia 01/2019   Bilateral, noted on CT   Pure hypercholesterolemia 2006   Traumatic partial tear of biceps tendon 01/2009   "popeye" injury on right   Wears glasses    Wears hearing aid in both ears    Wears partial dentures    lower   Wears partial dentures    lower    Past Surgical History:  Procedure Laterality Date   APPENDECTOMY  1955   BILIARY STENT PLACEMENT N/A 01/28/2019   Procedure:  BILIARY STENT PLACEMENT;  Surgeon: Willis Modena, MD;  [facility];  Service: Endoscopy;  Laterality: N/A;   CATARACT EXTRACTION Bilateral R 05/07/15, L 05/21/15   Dr. Dione Booze   CHOLECYSTECTOMY N/A 01/27/2019   Procedure: LAPAROSCOPIC CHOLECYSTECTOMY WITH INTRAOPEREATIVE CHOLANGIOGRAM;  Surgeon: Ovidio Kin, MD;  Location: [facility];  Service: General;  Laterality: N/A;   COLONOSCOPY  11/09, 10/29/10   Dr. Ewing Schlein   ERCP N/A 01/28/2019   Procedure: ENDOSCOPIC RETROGRADE CHOLANGIOPANCREATOGRAPHY (ERCP);  Surgeon: Willis Modena, MD;  Location: [facility];  Service: Endoscopy;  Laterality: N/A;   ERCP N/A 04/12/2019   Procedure: ENDOSCOPIC RETROGRADE CHOLANGIOPANCREATOGRAPHY (ERCP);  Surgeon: Vida Rigger, MD;  Location: [facility];  Service: Endoscopy;  Laterality: N/A;  stent removal   ESOPHAGOGASTRODUODENOSCOPY  10/29/10   Dr. Ewing Schlein   INGUINAL HERNIA REPAIR Right 1989   MELANOMA EXCISION  10/08/10   R upper arm, Dr. Daphine Deutscher   MELANOMA EXCISION  03/16/2012   Procedure: MELANOMA EXCISION;  Surgeon: Valarie Merino, MD;  Location: [facility];  Service: General;  Laterality: Right;  excision of melanoma of right arm   REMOVAL OF STONES  01/28/2019   Procedure: REMOVAL OF STONES;  Surgeon: Willis Modena, MD;  [facility];  Service: Endoscopy;;   SPHINCTEROTOMY  01/28/2019   Procedure: SPHINCTEROTOMY;  Surgeon: Willis Modena, MD;  Location: [facility];  Service: Endoscopy;;   SPHINCTEROTOMY  04/12/2019   Procedure: Dennison Mascot;  Surgeon: Vida Rigger, MD;  [facility];  Service: Endoscopy;;  stone sludge   STENT REMOVAL  04/12/2019   Procedure: STENT REMOVAL;  Surgeon: Vida Rigger, MD;  [facility];  Service: Endoscopy;;   TOTAL KNEE ARTHROPLASTY Right 2012   TOTAL KNEE ARTHROPLASTY Left 2005    Family History: Family History  Problem Relation Age of Onset   Diabetes Sister    Diabetes Brother    Heart disease Brother     Hyperlipidemia Brother    Hypertension Brother    Stroke Brother    Diabetes Sister    Cancer Paternal Uncle    Heart disease Paternal Uncle     Social History  reports that he has never smoked. He has never used smokeless tobacco. He reports current alcohol use. He reports that he does not use drugs.  Allergies  Allergen Reactions   Penicillins Rash    Medications   Current Facility-Administered Medications:    fentaNYL (SUBLIMAZE) bolus via infusion 25-100 mcg, 25-100 mcg, Intravenous, Q15 min PRN, Lorre Nick, MD   fentaNYL in NS (76mcg/ml) infusion-PREMIX, 25-200 mcg/hr, Intravenous, Continuous, Lorre Nick, MD, Last Rate: 10 mL/hr at 05/19/23 0937, 100 mcg/hr at 05/19/23 0937   levETIRAcetam (KEPPRA) IVPB 1000 mg/100 mL premix, 1,000 mg, Intravenous, Once, Rejeana Brock, MD   levETIRAcetam (KEPPRA) IVPB 1000 mg/100 mL premix, 1,000 mg, Intravenous, Once, Rejeana Brock, MD, Last Rate: 400 mL/hr at 05/19/23 1001, 1,000 mg at 05/19/23 1001   propofol (DIPRIVAN) 1000 MG/100ML infusion, 0-50 mcg/kg/min, Intravenous, Continuous, Lorre Nick, MD, Last Rate: 2.53 mL/hr at 05/19/23 0955, 5 mcg/kg/min at 05/19/23 0955  Current Outpatient Medications:    acetaminophen (TYLENOL) 325 MG tablet, Take 325-650 mg by mouth every 6 (six) hours as needed for mild pain (pain score 1-3) or moderate pain (pain score 4-6). (Patient not taking: Reported on 05/18/2023), Disp: , Rfl:    apixaban (ELIQUIS) 5 MG TABS tablet, Take 1 tablet by mouth twice daily, Disp: 180 tablet, Rfl: 1   Ascorbic Acid (VITAMIN C PO), Take 2 tablets by mouth daily., Disp: , Rfl:    Calcium Carbonate-Vitamin D 600-400 MG-UNIT tablet, Take 1 tablet by mouth 2 (two) times daily., Disp: , Rfl:    Coenzyme Q10 (COQ10) 100 MG CAPS, Take 100 mg by mouth daily at 12 noon., Disp: , Rfl:    fluorouracil (EFUDEX) 5 % cream, Apply 1 application  topically as needed (wounds). (Patient not taking:  Reported on 04/30/2023), Disp: , Rfl:    Glucosamine-Chondroitin (COSAMIN DS PO), Take 1 tablet by mouth 2 (two) times daily. With lunch & supper, Disp: , Rfl:    glucose blood (ONETOUCH VERIO) test strip, USE 1 STRIP TO CHECK GLUCOSE ONCE DAILY DX:E11.69, Disp: 100 each, Rfl: 3   Lancets (ONETOUCH DELICA PLUS LANCET33G) MISC, Patient to check blood sugar once daily(usually in the morning)., Disp: 100 each, Rfl: 3   loratadine (CLARITIN) 10 MG tablet, Take 10 mg by mouth daily., Disp: , Rfl:    magnesium oxide (MAG-OX) 400 (240 Mg) MG tablet, Take 400 mg by mouth daily., Disp: , Rfl:    metFORMIN (GLUCOPHAGE-XR) 500 MG 24 hr tablet, TAKE 3 TABLETS BY MOUTH ONCE DAILY WITH BREAKFAST (Patient taking differently: Take by mouth. Take 1000 mg by mouth in  the morning and 500 mg at bedtime.), Disp: 270 tablet, Rfl: 1   Multiple Vitamins-Minerals (MULTIVITAMIN WITH MINERALS) tablet, Take 1 tablet by mouth daily., Disp: , Rfl:    Omega-3 Fatty Acids (FISH OIL PO), Take 1 capsule by mouth daily with supper., Disp: , Rfl:    simvastatin (ZOCOR) 20 MG tablet, Take 1 tablet (20 mg total) by mouth at bedtime., Disp: 90 tablet, Rfl: 3  Vitals   Vitals:   05/26/2023 0956 05-26-23 0957 May 26, 2023 0958 26-May-2023 0959  BP:      Pulse: (!) 55 (!) 59 (!) 53 (!) 59  Resp: 18 17 16 18   Temp: 97.7 F (36.5 C) 97.9 F (36.6 C) 98 F (36.7 C) 98 F (36.7 C)  SpO2: 100% 100% 100% 100%  Weight:      Height:        Body mass index is 26.69 kg/m.  Physical Exam   In bed, on 5 mcg/kg/min of propofol, intubated.  Neurologic Examination    Neuro: Mental Status: Patient is obtunded, he does not open eyes or follow commands.  He does grimace to noxious stimulation  Cranial Nerves: II: Does not blink to threat from either side. Pupils are reactive to light.   III,IV, VI: Eyes are midline, he resists doll's maneuver.  V, VII: He has corneal response bilaterally, but slightly weaker on the left than right Motor: He  flexes to noxious stimulation in the left upper extremity, withdraws in all other extremities Sensory: As above  Cerebellar: Does not perform       Labs/Imaging/Neurodiagnostic studies   CBC:  Recent Labs  Lab 2023/05/26 0913 2023-05-26 0922  WBC 11.4*  --   NEUTROABS 7.3  --   HGB 14.1 15.0  HCT 44.7 44.0  MCV 102.3*  --   PLT 286  --    Basic Metabolic Panel:  Lab Results  Component Value Date   NA 138 2023-05-26   K 3.9 05-26-2023   CO2 22 05/18/2023   GLUCOSE 217 (H) 2023-05-26   BUN 23 2023/05/26   CREATININE 1.10 05-26-2023   CALCIUM 9.8 05/18/2023   GFRNONAA >60 04/23/2023   GFRAA 68 01/11/2020   Lipid Panel:  Lab Results  Component Value Date   LDLCALC 45 04/23/2023   HgbA1c:  Lab Results  Component Value Date   HGBA1C 6.6 (H) 04/23/2023   Urine Drug Screen: No results found for: "LABOPIA", "COCAINSCRNUR", "LABBENZ", "AMPHETMU", "THCU", "LABBARB"  Alcohol Level     Component Value Date/Time   ETH <10 04/22/2023 0544   INR  Lab Results  Component Value Date   INR 1.2 04/22/2023   APTT  Lab Results  Component Value Date   APTT 24 04/22/2023    ASSESSMENT   Anthonie Lotito Mohr is a 89 y.o. male with a history of a single previous episode of syncope versus seizure who presents with witnessed convulsive activity with left gaze deviation.  My suspicion is that this represents an underlying symptomatic seizure disorder and he will need long-term antiepileptic therapy.  Though no large areas of encephalomalacia, he has multiple areas with juxtacortical T2 change.  I will get a CTA given he is still not moving his left arm very well, he is not an IV TNK candidate due to anticoagulation, and I will exclude large vessel occlusion with CTA.  My suspicion for stroke, however, is very low.  He currently has bilateral cortically driven movements, so this my suspicion for status epilepticus is very low, but  I would favor getting overnight EEG given that he is  intubated.  RECOMMENDATIONS  Stat CT angio head and neck Overnight EEG Loaded with Keppra 2 g x 1 followed by 500 mg twice daily Check magnesium, UDS Neurology will continue to follow  ______________________________________________________________________   This patient is critically ill and at significant risk of neurological worsening, death and care requires constant monitoring of vital signs, hemodynamics,respiratory and cardiac monitoring, neurological assessment, discussion with family, other specialists and medical decision making of high complexity. I spent 40 minutes of neurocritical care time  in the care of  this patient. This was time spent independent of any time provided by nurse practitioner or PA.  Ritta Slot, MD [facility]   If 7pm- 7am, please page neurology on call as listed in AMION. 05/19/2023  10:36 AM

## 2023-05-19 NOTE — H&P (Addendum)
 NAME:  Andre Walker, MRN:  [id number], DOB:  [birth year], LOS: 0 ADMISSION DATE:  05/19/2023, CONSULTATION DATE:  3/25  REFERRING MD:  EDP, CHIEF COMPLAINT:  seizure, intubated    History of Present Illness:  89yo male with hx AFib on eliquis, DM presents 3/25 with seizure. Altered this morning, somewhat confused then had sudden onset seizure activity. Had recurrent seizure for EMS during transport with L gaze. On arrival to ER was not protecting his airway and was intubated.  Per wife had admission in February after episode of ?syncope/unresponsiveness where seizure was discussed but EEG did not show seizure.  Other than that has been in his usual state of health. Had outpt appt yesterday for f/u hypoMg - outpt Mg was 1.3 3/24 Pertinent  Medical History   has a past medical history of Actinic keratosis, Atrial fibrillation (HCC), BPH (benign prostatic hyperplasia), Colon polyps, Diabetes mellitus (2004), Diverticulosis, ED (erectile dysfunction), Gallstones, Hearing loss, Hiatal hernia, LAE (left atrial enlargement) (01/2019), Melanoma (HCC), Microalbuminuria, Pneumonia (01/2019), Pure hypercholesterolemia (2006), Traumatic partial tear of biceps tendon (01/2009), Wears glasses, Wears hearing aid in both ears, Wears partial dentures, and Wears partial dentures.   Significant Hospital Events: Including procedures, antibiotic start and stop dates in addition to other pertinent events   CT head 3/25>> neg acute  CTA head/neck 3/25>>>Negative for large vessel occlusion.  2. Generalized intracranial artery tortuosity but no significant intracranial atherosclerosis. But positive for Bulky Extracranial Atherosclerosis with multifocal High-grade stenoses: - Severe stenosis Right ICA origin approaching a RADIOGRAPHIC STRING SIGN. - superimposed 65% stenosis mid Right CCA. - 70% stenosis proximal Left Subclavian Artery. - Moderate to Severe stenosis at Both Vertebral Artery origins.  Interim History  / Subjective:  Seen en route to CT. No acute distress, sedated on vent. No obvious seizure activity.   Objective   Blood pressure (!) 96/55, pulse (!) 56, temperature 98.1 F (36.7 C), resp. rate 16, height 5\' 10"  (1.778 m), weight 83.6 kg, SpO2 100%.    Vent Mode: PRVC FiO2 (%):  [60 %-100 %] 60 % Set Rate:  [18 bmp] 18 bmp Vt Set:  [580 mL] 580 mL PEEP:  [5 cmH20] 5 cmH20  No intake or output data in the 24 hours ending 05/19/23 1143 Filed Weights   05/19/23 0912 05/19/23 1140  Weight: 84.4 kg 83.6 kg    Examination: General: chronically ill appearing elderly male, sedated on vent, NAD  HENT: ETT< mm moist Lungs: resps even non labored on vent, diminished bases  Cardiovascular: s1s2 rrr Abdomen: round, soft Extremities: warm and dry Neuro: sedated on vent, RASS -2, pupils 2mm, reactive, leftward gaze  Resolved Hospital Problem list     Assessment & Plan:  Seizure  AMS  P:  Neuro following  Low suspicion for stroke  Hold eliquis for now - heparin per pharmacy if ok with neuro  Overnight EEG ordered  Keppra  Check u/a   Respiratory failure - intubated for airway protection in setting seizure/AMS P:  Vent support - 8cc/kg  F/u CXR  F/u ABG Daily SBT  PAD sedation protocol while on vent - low suspicion SE per neuro  AFib on eliquis  P:  Holding eliquis for now  Heparin per pharmacy if ok with neuro  Appears not on rate control medication at baseline   Hypomagnesemia P:  F/u chem, mg, phos  Resume Mg supplement    DM  P:  Hold metformin  SSI   Bradycardia - after initial exam in  ER, nursing called reporting episode of bradycardia into low 30s. Resolved with 1 dose atropine  P:  EKG  F/u chem  Repeat ABG  Wean sedation as able  TSH Best Practice (right click and "Reselect all SmartList Selections" daily)   Diet/type: NPO DVT prophylaxis SCD Pressure ulcer(s): N/A GI prophylaxis: H2B Lines: N/A Foley:  N/A Code Status:  full code Last date  of multidisciplinary goals of care discussion [3/25] Discussed at length with wife at bedside in ER. Full code but wife confirms pt has living will and would not want prolonged support.   Labs   CBC: Recent Labs  Lab 05/19/23 0913 05/19/23 0922 05/19/23 1005  WBC 11.4*  --   --   NEUTROABS 7.3  --   --   HGB 14.1 15.0 12.9*  HCT 44.7 44.0 38.0*  MCV 102.3*  --   --   PLT 286  --   --     Basic Metabolic Panel: Recent Labs  Lab 05/18/23 1408 05/19/23 0913 05/19/23 0922 05/19/23 1005  NA 141 137 138 137  K 4.6 3.9 3.9 4.2  CL 103 101 103  --   CO2 22 15*  --   --   GLUCOSE 143* 226* 217*  --   BUN 26 22 23   --   CREATININE 0.94 1.29* 1.10  --   CALCIUM 9.8 9.2  --   --   MG 1.3*  --   --   --    GFR: Estimated Creatinine Clearance: 47 mL/min (by C-G formula based on SCr of 1.1 mg/dL). Recent Labs  Lab 05/19/23 0913  WBC 11.4*    Liver Function Tests: Recent Labs  Lab 05/19/23 0913  AST 26  ALT 21  ALKPHOS 84  BILITOT 1.0  PROT 7.1  ALBUMIN 4.0   No results for input(s): "LIPASE", "AMYLASE" in the last 168 hours. No results for input(s): "AMMONIA" in the last 168 hours.  ABG    Component Value Date/Time   PHART 7.250 (L) 05/19/2023 1005   PCO2ART 45.9 05/19/2023 1005   PO2ART 163 (H) 05/19/2023 1005   HCO3 20.1 05/19/2023 1005   TCO2 22 05/19/2023 1005   ACIDBASEDEF 7.0 (H) 05/19/2023 1005   O2SAT 99 05/19/2023 1005     Coagulation Profile: Recent Labs  Lab 05/19/23 0913  INR 1.2    Cardiac Enzymes: No results for input(s): "CKTOTAL", "CKMB", "CKMBINDEX", "TROPONINI" in the last 168 hours.  HbA1C: Hemoglobin A1C  Date/Time Value Ref Range Status  03/12/2023 01:57 PM 6.8 (A) 4.0 - 5.6 % Final  08/06/2022 02:31 PM 6.4 (A) 4.0 - 5.6 % Final   Hgb A1c MFr Bld  Date/Time Value Ref Range Status  04/23/2023 04:45 AM 6.6 (H) 4.8 - 5.6 % Final    Comment:    (NOTE) Pre diabetes:          5.7%-6.4%  Diabetes:              >6.4%  Glycemic  control for   <7.0% adults with diabetes   01/29/2022 08:34 AM 6.6 (H) 4.8 - 5.6 % Final    Comment:             Prediabetes: 5.7 - 6.4          Diabetes: >6.4          Glycemic control for adults with diabetes: <7.0     CBG: Recent Labs  Lab 05/19/23 0910  GLUCAP 219*    Review of  Systems:   Unable - pt sedated on vent. Hx obtained from records, RN, and wife at bedside.   Past Medical History:  He,  has a past medical history of Actinic keratosis, Atrial fibrillation (HCC), BPH (benign prostatic hyperplasia), Colon polyps, Diabetes mellitus (2004), Diverticulosis, ED (erectile dysfunction), Gallstones, Hearing loss, Hiatal hernia, LAE (left atrial enlargement) (01/2019), Melanoma (HCC), Microalbuminuria, Pneumonia (01/2019), Pure hypercholesterolemia (2006), Traumatic partial tear of biceps tendon (01/2009), Wears glasses, Wears hearing aid in both ears, Wears partial dentures, and Wears partial dentures.   Surgical History:   Past Surgical History:  Procedure Laterality Date   APPENDECTOMY  1955   BILIARY STENT PLACEMENT N/A 01/28/2019   Procedure: BILIARY STENT PLACEMENT;  Surgeon: Willis Modena, MD;  [facility];  Service: Endoscopy;  Laterality: N/A;   CATARACT EXTRACTION Bilateral R 05/07/15, L 05/21/15   Dr. Dione Booze   CHOLECYSTECTOMY N/A 01/27/2019   Procedure: LAPAROSCOPIC CHOLECYSTECTOMY WITH INTRAOPEREATIVE CHOLANGIOGRAM;  Surgeon: Ovidio Kin, MD;  Location: [facility];  Service: General;  Laterality: N/A;   COLONOSCOPY  11/09, 10/29/10   Dr. Ewing Schlein   ERCP N/A 01/28/2019   Procedure: ENDOSCOPIC RETROGRADE CHOLANGIOPANCREATOGRAPHY (ERCP);  Surgeon: Willis Modena, MD;  Location: [facility];  Service: Endoscopy;  Laterality: N/A;   ERCP N/A 04/12/2019   Procedure: ENDOSCOPIC RETROGRADE CHOLANGIOPANCREATOGRAPHY (ERCP);  Surgeon: Vida Rigger, MD;  Location: [facility];  Service: Endoscopy;  Laterality: N/A;  stent removal   ESOPHAGOGASTRODUODENOSCOPY  10/29/10    Dr. Ewing Schlein   INGUINAL HERNIA REPAIR Right 1989   MELANOMA EXCISION  10/08/10   R upper arm, Dr. Daphine Deutscher   MELANOMA EXCISION  03/16/2012   Procedure: MELANOMA EXCISION;  Surgeon: Valarie Merino, MD;  Location: [facility];  Service: General;  Laterality: Right;  excision of melanoma of right arm   REMOVAL OF STONES  01/28/2019   Procedure: REMOVAL OF STONES;  Surgeon: Willis Modena, MD;  [facility]: [facility];  Service: Endoscopy;;   SPHINCTEROTOMY  01/28/2019   Procedure: Dennison Mascot;  Surgeon: Willis Modena, MD;  [facility]: [facility];  Service: Endoscopy;;   SPHINCTEROTOMY  04/12/2019   Procedure: Dennison Mascot;  Surgeon: Vida Rigger, MD;  Location: [facility];  Service: Endoscopy;;  stone sludge   STENT REMOVAL  04/12/2019   Procedure: STENT REMOVAL;  Surgeon: Vida Rigger, MD;  [facility]: [facility];  Service: Endoscopy;;   TOTAL KNEE ARTHROPLASTY Right 2012   TOTAL KNEE ARTHROPLASTY Left 2005     Social History:   reports that he has never smoked. He has never used smokeless tobacco. He reports current alcohol use. He reports that he does not use drugs.   Family History:  His family history includes Cancer in his paternal uncle; Diabetes in his brother, sister, and sister; Heart disease in his brother and paternal uncle; Hyperlipidemia in his brother; Hypertension in his brother; Stroke in his brother.   Allergies Allergies  Allergen Reactions   Penicillins Rash     Home Medications  Prior to Admission medications   Medication Sig Start Date End Date Taking? Authorizing Provider  acetaminophen (TYLENOL) 325 MG tablet Take 325-650 mg by mouth every 6 (six) hours as needed for mild pain (pain score 1-3) or moderate pain (pain score 4-6). Patient not taking: Reported on 05/18/2023    [provider]  apixaban Everlene Balls) 5 MG TABS tablet Take 1 tablet by mouth twice daily 04/07/23   [place], Peter M, MD  Ascorbic Acid (VITAMIN C PO) Take 2 tablets by  mouth daily.  [provider]  Calcium Carbonate-Vitamin D 600-400 MG-UNIT tablet Take 1 tablet by mouth 2 (two) times daily.    [provider]  Coenzyme Q10 (COQ10) 100 MG CAPS Take 100 mg by mouth daily at 12 noon.    [provider]  fluorouracil (EFUDEX) 5 % cream Apply 1 application  topically as needed (wounds). Patient not taking: Reported on 04/30/2023 05/08/20   [provider]  Glucosamine-Chondroitin (COSAMIN DS PO) Take 1 tablet by mouth 2 (two) times daily. With lunch & supper    [provider]  glucose blood (ONETOUCH VERIO) test strip USE 1 STRIP TO CHECK GLUCOSE ONCE DAILY DX:E11.69 05/17/20   Joselyn Arrow, MD  Lancets [facility] DELICA PLUS [place]) MISC Patient to check blood sugar once daily(usually in the morning). 06/11/22   Joselyn Arrow, MD  loratadine (CLARITIN) 10 MG tablet Take 10 mg by mouth daily.    [provider]  magnesium oxide (MAG-OX) 400 (240 Mg) MG tablet Take 400 mg by mouth daily.    [provider]  metFORMIN (GLUCOPHAGE-XR) 500 MG 24 hr tablet TAKE 3 TABLETS BY MOUTH ONCE DAILY WITH BREAKFAST Patient taking differently: Take by mouth. Take 1000 mg by mouth in the morning and 500 mg at bedtime. 03/12/23   Joselyn Arrow, MD  Multiple Vitamins-Minerals (MULTIVITAMIN WITH MINERALS) tablet Take 1 tablet by mouth daily.    [provider]  Omega-3 Fatty Acids (FISH OIL PO) Take 1 capsule by mouth daily with supper.    [provider]  simvastatin (ZOCOR) 20 MG tablet Take 1 tablet (20 mg total) by mouth at bedtime. 03/12/23   Joselyn Arrow, MD     Critical care time:    Dirk Dress, NP Pulmonary/Critical Care Medicine  05/19/2023  11:43 AM   See Loretha Stapler for personal pager PCCM on call pager [phone] until 7pm. Please call Elink 7p-7a. [phone]

## 2023-05-19 NOTE — Progress Notes (Signed)
 Pt transported on vent to 3M05 without complications.

## 2023-05-19 NOTE — Progress Notes (Signed)
 Went to perform EEG at 10AM, and pt was going to CT. Messaged to do EEG now and Pt has an MRI and a room upstairs. Will check back as schedule permits

## 2023-05-19 NOTE — Progress Notes (Signed)
 Pt transported on vent to CT and back to ED 25, again for CTA.

## 2023-05-19 NOTE — Progress Notes (Cosign Needed Addendum)
 LTM EEG hooked up and running - no initial skin breakdown - push button tested - [facility] monitoring. Pt has MRI leads on  pt has skin abrasion right side of scalp at f4, c4

## 2023-05-19 NOTE — ED Provider Notes (Signed)
 [facility]MENT AT [facility] Provider Note   CSN: [id number] Arrival date & time: 05/19/23  0901     History  Chief Complaint  Patient presents with   Altered Mental Status    Andre Walker is a 89 y.o. male.  This is a 89 year old male presents via EMS due to altered mental status.  Patient's last seen normal was sometime last night patient has history of A-fib and is on Eliquis.  Patient was found to have left-sided gaze which then developed into seizure activity.  Wife called EMS and blood sugar was over 100.  Seizure activity in front of EMS was given 5 mg of Versed.  Patient presents in extremis no further history obtainable       Home Medications Prior to Admission medications   Medication Sig Start Date End Date Taking? Authorizing Provider  acetaminophen (TYLENOL) 325 MG tablet Take 325-650 mg by mouth every 6 (six) hours as needed for mild pain (pain score 1-3) or moderate pain (pain score 4-6). Patient not taking: Reported on 05/18/2023    [provider]  apixaban Everlene Balls) 5 MG TABS tablet Take 1 tablet by mouth twice daily 04/07/23   [place], Peter M, MD  Ascorbic Acid (VITAMIN C PO) Take 2 tablets by mouth daily.    [provider]  Calcium Carbonate-Vitamin D 600-400 MG-UNIT tablet Take 1 tablet by mouth 2 (two) times daily.    [provider]  Coenzyme Q10 (COQ10) 100 MG CAPS Take 100 mg by mouth daily at 12 noon.    [provider]  fluorouracil (EFUDEX) 5 % cream Apply 1 application  topically as needed (wounds). Patient not taking: Reported on 04/30/2023 05/08/20   [provider]  Glucosamine-Chondroitin (COSAMIN DS PO) Take 1 tablet by mouth 2 (two) times daily. With lunch & supper    [provider]  glucose blood (ONETOUCH VERIO) test strip USE 1 STRIP TO CHECK GLUCOSE ONCE DAILY DX:E11.69 05/17/20   Joselyn Arrow, MD  Lancets [facility] DELICA PLUS [place]) MISC Patient to check blood  sugar once daily(usually in the morning). 06/11/22   Joselyn Arrow, MD  loratadine (CLARITIN) 10 MG tablet Take 10 mg by mouth daily.    [provider]  magnesium oxide (MAG-OX) 400 (240 Mg) MG tablet Take 400 mg by mouth daily.    [provider]  metFORMIN (GLUCOPHAGE-XR) 500 MG 24 hr tablet TAKE 3 TABLETS BY MOUTH ONCE DAILY WITH BREAKFAST Patient taking differently: Take by mouth. Take 1000 mg by mouth in the morning and 500 mg at bedtime. 03/12/23   Joselyn Arrow, MD  Multiple Vitamins-Minerals (MULTIVITAMIN WITH MINERALS) tablet Take 1 tablet by mouth daily.    [provider]  Omega-3 Fatty Acids (FISH OIL PO) Take 1 capsule by mouth daily with supper.    [provider]  simvastatin (ZOCOR) 20 MG tablet Take 1 tablet (20 mg total) by mouth at bedtime. 03/12/23   Joselyn Arrow, MD      Allergies    Penicillins    Review of Systems   Review of Systems  Physical Exam Updated Vital Signs Ht 1.778 m (5\' 10" )   Wt 84.4 kg   BMI 26.69 kg/m  Physical Exam Vitals and nursing note reviewed.  Constitutional:      General: He is not in acute distress.    Appearance: Normal appearance. He is well-developed. He is not toxic-appearing.  HENT:     Head: Normocephalic and atraumatic.  Eyes:     General: Lids are normal.     Conjunctiva/sclera: Conjunctivae normal.     Pupils: Pupils are equal, round, and reactive to light.     Comments: Pinpoint pupils bilateral  Neck:     Thyroid: No thyroid mass.     Trachea: No tracheal deviation.  Cardiovascular:     Rate and Rhythm: Normal rate and regular rhythm.     Heart sounds: Normal heart sounds. No murmur heard.    No gallop.  Pulmonary:     Effort: Pulmonary effort is normal. No respiratory distress.     Breath sounds: Normal breath sounds. No stridor. No decreased breath sounds, wheezing, rhonchi or rales.  Abdominal:     General: There is no distension.     Palpations: Abdomen is soft.     Tenderness:  There is no abdominal tenderness. There is no rebound.  Musculoskeletal:        General: No tenderness. Normal range of motion.     Cervical back: Normal range of motion and neck supple.  Skin:    General: Skin is warm and dry.     Findings: No abrasion or rash.  Neurological:     Mental Status: He is unresponsive.     GCS: GCS eye subscore is 1. GCS verbal subscore is 1. GCS motor subscore is 1.     Comments: No withdrawal to pain in all 4 extremities Patient unresponsive left-sided gaze  Psychiatric:        Attention and Perception: He is inattentive.     ED Results / Procedures / Treatments   Labs (all labs ordered are listed, but only abnormal results are displayed) Labs Reviewed  ETHANOL  PROTIME-INR  APTT  CBC  DIFFERENTIAL  COMPREHENSIVE METABOLIC PANEL  RAPID URINE DRUG SCREEN, HOSP PERFORMED  URINALYSIS, ROUTINE W REFLEX MICROSCOPIC  I-STAT CHEM 8, ED    EKG None  Radiology No results found.  Procedures Procedures    Medications Ordered in ED Medications  fentaNYL in NS (71mcg/ml) infusion-PREMIX (has no administration in time range)  fentaNYL (SUBLIMAZE) bolus via infusion 25-100 mcg (has no administration in time range)  propofol (DIPRIVAN) 1000 MG/100ML infusion (has no administration in time range)    ED Course/ Medical Decision Making/ A&P                                 Medical Decision Making Amount and/or Complexity of Data Reviewed Labs: ordered. Radiology: ordered.  Risk Prescription drug management. Decision regarding hospitalization.   Patient intubated by physician assistant.  Please see his note.  This was done for airway protection and I was in attendance at all time.  He emergently went for CT of the head which did not show any evidence of acute hemorrhage.  Urgent neurology consultation performed and patient started on Keppra as it is thought the patient may have seizures at etiology of his problems.  Patient given  sedation with propofol and had transient hypotension that was treated with IV fluids.  Patient was in bed with succinylcholine which is since worn off.  We are able to monitor for return of seizure activity.  Discussed with critical care who will come and see the patient.  CRITICAL CARE Performed by: Toy Baker Total critical care time: 60 minutes Critical care time was exclusive of separately billable procedures and treating other patients. Critical care was necessary to treat  or prevent imminent or life-threatening deterioration. Critical care was time spent personally by me on the following activities: development of treatment plan with patient and/or surrogate as well as nursing, discussions with consultants, evaluation of patient's response to treatment, examination of patient, obtaining history from patient or surrogate, ordering and performing treatments and interventions, ordering and review of laboratory studies, ordering and review of radiographic studies, pulse oximetry and re-evaluation of patient's condition.         Final Clinical Impression(s) / ED Diagnoses Final diagnoses:  None    Rx / DC Orders ED Discharge Orders     None         Lorre Nick, MD 05/19/23 1012

## 2023-05-19 NOTE — Progress Notes (Signed)
 Pt transported on vent to CT and back to ED 25 without complications. RT will monitor.

## 2023-05-20 ENCOUNTER — Inpatient Hospital Stay (HOSPITAL_COMMUNITY)

## 2023-05-20 DIAGNOSIS — R569 Unspecified convulsions: Secondary | ICD-10-CM | POA: Diagnosis not present

## 2023-05-20 DIAGNOSIS — I959 Hypotension, unspecified: Secondary | ICD-10-CM

## 2023-05-20 LAB — GLUCOSE, CAPILLARY
Glucose-Capillary: 113 mg/dL — ABNORMAL HIGH (ref 70–99)
Glucose-Capillary: 118 mg/dL — ABNORMAL HIGH (ref 70–99)
Glucose-Capillary: 122 mg/dL — ABNORMAL HIGH (ref 70–99)
Glucose-Capillary: 126 mg/dL — ABNORMAL HIGH (ref 70–99)
Glucose-Capillary: 146 mg/dL — ABNORMAL HIGH (ref 70–99)
Glucose-Capillary: 84 mg/dL (ref 70–99)

## 2023-05-20 LAB — CBC
HCT: 43.2 % (ref 39.0–52.0)
Hemoglobin: 14.3 g/dL (ref 13.0–17.0)
MCH: 32.2 pg (ref 26.0–34.0)
MCHC: 33.1 g/dL (ref 30.0–36.0)
MCV: 97.3 fL (ref 80.0–100.0)
Platelets: 249 10*3/uL (ref 150–400)
RBC: 4.44 MIL/uL (ref 4.22–5.81)
RDW: 12.4 % (ref 11.5–15.5)
WBC: 14.9 10*3/uL — ABNORMAL HIGH (ref 4.0–10.5)
nRBC: 0 % (ref 0.0–0.2)

## 2023-05-20 LAB — BASIC METABOLIC PANEL
Anion gap: 13 (ref 5–15)
BUN: 25 mg/dL — ABNORMAL HIGH (ref 8–23)
CO2: 22 mmol/L (ref 22–32)
Calcium: 8.9 mg/dL (ref 8.9–10.3)
Chloride: 103 mmol/L (ref 98–111)
Creatinine, Ser: 1.17 mg/dL (ref 0.61–1.24)
GFR, Estimated: 60 mL/min — ABNORMAL LOW (ref >60)
Glucose, Bld: 121 mg/dL — ABNORMAL HIGH (ref 70–99)
Potassium: 3.9 mmol/L (ref 3.5–5.1)
Sodium: 138 mmol/L (ref 135–145)

## 2023-05-20 LAB — LACTIC ACID, PLASMA: Lactic Acid, Venous: 3 mmol/L (ref 0.5–1.9)

## 2023-05-20 LAB — TRIGLYCERIDES: Triglycerides: 75 mg/dL (ref <150)

## 2023-05-20 LAB — MAGNESIUM: Magnesium: 1.6 mg/dL — ABNORMAL LOW (ref 1.7–2.4)

## 2023-05-20 LAB — PHOSPHORUS: Phosphorus: 3.3 mg/dL (ref 2.5–4.6)

## 2023-05-20 MED ORDER — HALOPERIDOL LACTATE 5 MG/ML IJ SOLN
2.0000 mg | Freq: Four times a day (QID) | INTRAMUSCULAR | Status: DC | PRN
  Administered 2023-05-20: 2 mg via INTRAVENOUS
  Filled 2023-05-20: qty 1

## 2023-05-20 MED ORDER — NOREPINEPHRINE 4 MG/250ML-% IV SOLN
INTRAVENOUS | Status: AC
  Administered 2023-05-20: 3 ug/min via INTRAVENOUS
  Filled 2023-05-20: qty 250

## 2023-05-20 MED ORDER — APIXABAN 5 MG PO TABS: 5.0000 mg | ORAL_TABLET | Freq: Two times a day (BID) | ORAL | Status: DC

## 2023-05-20 MED ORDER — GADOBUTROL 1 MMOL/ML IV SOLN
8.0000 mL | Freq: Once | INTRAVENOUS | Status: AC | PRN
  Administered 2023-05-20: 8 mL via INTRAVENOUS

## 2023-05-20 MED ORDER — OLANZAPINE 10 MG IM SOLR
2.5000 mg | Freq: Four times a day (QID) | INTRAMUSCULAR | Status: AC | PRN
  Administered 2023-05-20 – 2023-05-22 (×2): 2.5 mg via INTRAMUSCULAR
  Filled 2023-05-20 (×4): qty 10

## 2023-05-20 MED ORDER — NOREPINEPHRINE 4 MG/250ML-% IV SOLN: 2.0000 ug/min | INTRAVENOUS | Status: DC

## 2023-05-20 MED ORDER — IPRATROPIUM-ALBUTEROL 0.5-2.5 (3) MG/3ML IN SOLN
3.0000 mL | RESPIRATORY_TRACT | Status: DC | PRN
  Administered 2023-05-21 – 2023-05-22 (×4): 3 mL via RESPIRATORY_TRACT
  Filled 2023-05-20 (×4): qty 3

## 2023-05-20 MED ORDER — LACTATED RINGERS IV BOLUS
1000.0000 mL | Freq: Once | INTRAVENOUS | Status: AC
  Administered 2023-05-20: 1000 mL via INTRAVENOUS

## 2023-05-20 MED ORDER — STERILE WATER FOR INJECTION IJ SOLN
INTRAMUSCULAR | Status: AC
  Administered 2023-05-20: 2.5 mL
  Filled 2023-05-20: qty 10

## 2023-05-20 MED ORDER — MAGNESIUM SULFATE 2 GM/50ML IV SOLN
2.0000 g | Freq: Once | INTRAVENOUS | Status: AC
  Administered 2023-05-20: 2 g via INTRAVENOUS
  Filled 2023-05-20: qty 50

## 2023-05-20 MED ORDER — ORAL CARE MOUTH RINSE: 15.0000 mL | OROMUCOSAL | Status: DC | PRN

## 2023-05-20 MED ORDER — ARFORMOTEROL TARTRATE 15 MCG/2ML IN NEBU
15.0000 ug | INHALATION_SOLUTION | Freq: Two times a day (BID) | RESPIRATORY_TRACT | Status: DC
  Administered 2023-05-20 – 2023-05-21 (×2): 15 ug via RESPIRATORY_TRACT
  Filled 2023-05-20 (×2): qty 2

## 2023-05-20 MED ORDER — SODIUM CHLORIDE 3 % IN NEBU
4.0000 mL | INHALATION_SOLUTION | Freq: Three times a day (TID) | RESPIRATORY_TRACT | Status: AC
  Administered 2023-05-20 – 2023-05-23 (×9): 4 mL via RESPIRATORY_TRACT
  Filled 2023-05-20 (×3): qty 4
  Filled 2023-05-20 (×3): qty 15
  Filled 2023-05-20: qty 4
  Filled 2023-05-20 (×2): qty 15

## 2023-05-20 MED FILL — Fentanyl Citrate-NaCl 0.9% IV Soln 2.5 MG/250ML: INTRAVENOUS | Qty: 250 | Status: AC

## 2023-05-20 NOTE — Progress Notes (Signed)
 Gave ring x 2 to patient's wife [name] to take home.

## 2023-05-20 NOTE — Progress Notes (Incomplete)
 Initial Nutrition Assessment  DOCUMENTATION CODES:      INTERVENTION:  ***   NUTRITION DIAGNOSIS:     related to   as evidenced by  .  ***  GOAL:      ***  MONITOR:      REASON FOR ASSESSMENT:   Ventilator    ASSESSMENT:   Pt with hx afib (on eliquis), type 2 diabetes, melanoma, and hearing loss. Admitted following witnessed seizure at home. Intubated for protection of airway  Patient is currently intubated on ventilator support MV: 16.6 L/min Temp (24hrs), Avg:99.9 F (37.7 C), Min:97.5 F (36.4 C), Max:101.7 F (38.7 C)  MAP (3/26):  Propofol: 2.53 ml/hr Norepinephrine: 3mcg/min  Admit weight: ***  Current weight:  Last Weight  Most recent update: 05/20/2023  1:43 AM    Weight  82.9 kg (182 lb 12.2 oz)              Intake/Output Summary (Last 24 hours) at 05/20/2023 0916 Last data filed at 05/20/2023 0900 Gross per 24 hour  Intake 3360.6 ml  Output 1200 ml  Net 2160.6 ml   Net IO Since Admission: 2,160.6 mL [05/20/23 0916] UOP: 1,180 mL  Drains/Lines: OG tube Endotracheal tube Peripheral IV Urethral Catheter  Nutritionally Relevant Medications: Scheduled Meds:  Chlorhexidine Gluconate Cloth  6 each Topical Daily   famotidine  20 mg Per Tube Daily   heparin injection (subcutaneous)  5,000 Units Subcutaneous Q8H   insulin aspart  0-15 Units Subcutaneous Q4H   mouth rinse  15 mL Mouth Rinse Q2H   Continuous Infusions:  sodium chloride Stopped (05/20/23 0713)   cefTRIAXone (ROCEPHIN)  IV Stopped (05/19/23 1916)   dexmedetomidine (PRECEDEX) IV infusion Stopped (05/20/23 0847)   fentaNYL infusion INTRAVENOUS 50 mcg/hr (05/20/23 0900)   levETIRAcetam Stopped (05/19/23 2146)   norepinephrine (LEVOPHED) Adult infusion 3 mcg/min (05/20/23 0900)   propofol (DIPRIVAN) infusion 5 mcg/kg/min (05/20/23 0900)   PRN Meds:.acetaminophen, docusate sodium, fentaNYL, ibuprofen, mouth rinse, polyethylene glycol  Labs Reviewed: *** CBG  ranges from ***-*** mg/dL over the last 24 hours HgbA1c ***    NUTRITION - FOCUSED PHYSICAL EXAM:  {RD Focused Exam List:21252}  Diet Order:   Diet Order             Diet NPO time specified  Diet effective now                   EDUCATION NEEDS:      Skin:     Last BM:     Height:   Ht Readings from Last 1 Encounters:  05/19/23 5\' 10"  (1.778 m)    Weight:   Wt Readings from Last 1 Encounters:  05/20/23 82.9 kg    Ideal Body Weight:     BMI:  Body mass index is 26.22 kg/m.  Estimated Nutritional Needs:   Kcal:     Protein:     Fluid:       ***

## 2023-05-20 NOTE — Plan of Care (Signed)

## 2023-05-20 NOTE — Procedures (Signed)
 Extubation Procedure Note  Patient Details:   Name: Andre Walker DOB: [birth year] MRN: [id number]   Airway Documentation:    Vent end date: 05/20/23 Vent end time: 1320   Evaluation  O2 sats: stable throughout Complications: No apparent complications Patient did tolerate procedure well. Bilateral Breath Sounds: Clear, Diminished   Yes  Positive for cuff leak when when balloon was deflated  Karolee Meloni 05/20/2023, 1:21 PM

## 2023-05-20 NOTE — Progress Notes (Signed)
 Vent patient transported from 50m05 to MRI and back without any complications.

## 2023-05-20 NOTE — Progress Notes (Signed)
 NAME:  Andre Walker, MRN:  [id number], DOB:  Jan 15, 1933, LOS: 1 ADMISSION DATE:  05/19/2023, CONSULTATION DATE:  3/25  REFERRING MD:  EDP, CHIEF COMPLAINT:  seizure, intubated    History of Present Illness:  89yo male resident of SNF with hx AFib on eliquis, DM presents 3/25 with seizure. Altered this morning, somewhat confused then had sudden onset seizure activity. Had recurrent seizure for EMS during transport with L gaze. On arrival to ER was not protecting his airway and was intubated.  Per wife had admission in February after episode of ?syncope/unresponsiveness where seizure was discussed but EEG did not show seizure.  Other than that has been in his usual state of health. Had outpt appt yesterday for f/u hypoMg - outpt Mg was 1.3 3/24 Pertinent  Medical History   has a past medical history of Actinic keratosis, Atrial fibrillation (HCC), BPH (benign prostatic hyperplasia), Colon polyps, Diabetes mellitus (2004), Diverticulosis, ED (erectile dysfunction), Gallstones, Hearing loss, Hiatal hernia, LAE (left atrial enlargement) (01/2019), Melanoma (HCC), Microalbuminuria, Pneumonia (01/2019), Pure hypercholesterolemia (2006), Traumatic partial tear of biceps tendon (01/2009), Wears glasses, Wears hearing aid in both ears, Wears partial dentures, and Wears partial dentures.   Significant Hospital Events: Including procedures, antibiotic start and stop dates in addition to other pertinent events   CT head 3/25>> neg acute  CTA head/neck 3/25>>>Negative for large vessel occlusion.  2. Generalized intracranial artery tortuosity but no significant intracranial atherosclerosis. But positive for Bulky Extracranial Atherosclerosis with multifocal High-grade stenoses: - Severe stenosis Right ICA origin approaching a RADIOGRAPHIC STRING SIGN. - superimposed 65% stenosis mid Right CCA. - 70% stenosis proximal Left Subclavian Artery. - Moderate to Severe stenosis at Both Vertebral Artery  origins.  Interim History / Subjective:  Brady on dex, did nto get MRI due to "agitation" but no new meds requested to aid in this and missed MRI appt. BP dropping with sedation, fluid bolus, add NE, switch to propofol to aid in obtaining MRI and stop dex with brady to 30s.  Objective   Blood pressure (!) 96/56, pulse 61, temperature 99.9 F (37.7 C), temperature source Bladder, resp. rate 15, height 5\' 10"  (1.778 m), weight 82.9 kg, SpO2 94%.    Vent Mode: PRVC FiO2 (%):  [40 %-100 %] 40 % Set Rate:  [18 bmp] 18 bmp Vt Set:  [580 mL] 580 mL PEEP:  [5 cmH20] 5 cmH20 Pressure Support:  [0 cmH20-10 cmH20] 0 cmH20 Plateau Pressure:  [10 cmH20-13 cmH20] 13 cmH20   Intake/Output Summary (Last 24 hours) at 05/20/2023 4098 Last data filed at 05/20/2023 0800 Gross per 24 hour  Intake 2827.82 ml  Output 1200 ml  Net 1627.82 ml   Filed Weights   05/19/23 0912 05/19/23 1140 05/20/23 0143  Weight: 84.4 kg 83.6 kg 82.9 kg    Examination: General: chronically ill appearing elderly male, sedated on vent, NAD  HENT: ETT< mm moist Lungs: resps even non labored on vent, diminished bases  Cardiovascular: s1s2 rrr Abdomen: round, soft Extremities: warm and dry Neuro: sedated on vent, RASS -2, pupils 2mm, reactive, leftward gaze  Resolved Hospital Problem list     Assessment & Plan:  Seizure  AMS  P:  Neuro following  Low suspicion for stroke  Overnight EEG no seizures Keppra  Obtain MRI per neurology recommendations  Respiratory failure - intubated for airway protection in setting seizure/AMS P:  PRVC, VAP bundle, stress ulcer prophylaxis Plan SBT after MRI obtained  AFib on eliquis  P:  Holding eliquis  for now  Appears not on rate control medication at baseline   Hypomagnesemia P:  Status post magnesium supplementation, awaiting repeat labs delayed with MRI this morning  DM  P:  Hold metformin  SSI   Bradycardia -unclear if develop before or after dexmedetomidine but  likely dexmedetomidine making things worse P:  Stop dexemedetomidine  Hypotension: With increasing sedation likely hypovolemic. P: Additional 1 L LR Norepinephrine, MAP goal greater than 65  Best Practice (right click and "Reselect all SmartList Selections" daily)   Diet/type: NPO DVT prophylaxis SCD Pressure ulcer(s): N/A GI prophylaxis: H2B Lines: N/A Foley:  N/A Code Status:  full code Last date of multidisciplinary goals of care discussion [3/25] Discussed at length with wife at bedside in ER. Full code but wife confirms pt has living will and would not want prolonged support.   Labs   CBC: Recent Labs  Lab 05/19/23 0913 05/19/23 0922 05/19/23 1005 05/19/23 1213  WBC 11.4*  --   --   --   NEUTROABS 7.3  --   --   --   HGB 14.1 15.0 12.9* 12.9*  HCT 44.7 44.0 38.0* 38.0*  MCV 102.3*  --   --   --   PLT 286  --   --   --     Basic Metabolic Panel: Recent Labs  Lab 05/18/23 1408 05/19/23 0913 05/19/23 0922 05/19/23 1005 05/19/23 1210 05/19/23 1213 05/19/23 1552  NA 141 137 138 137  --  137 136  K 4.6 3.9 3.9 4.2  --  4.3 4.3  CL 103 101 103  --   --   --  106  CO2 22 15*  --   --   --   --  22  GLUCOSE 143* 226* 217*  --   --   --  113*  BUN 26 22 23   --   --   --  21  CREATININE 0.94 1.29* 1.10  --   --   --  1.00  CALCIUM 9.8 9.2  --   --   --   --  8.8*  MG 1.3*  --   --   --  1.4*  --   --    GFR: Estimated Creatinine Clearance: 51.7 mL/min (by C-G formula based on SCr of 1 mg/dL). Recent Labs  Lab 05/19/23 0913 05/19/23 1210 05/19/23 1424  WBC 11.4*  --   --   LATICACIDVEN  --  3.6* 3.2*    Liver Function Tests: Recent Labs  Lab 05/19/23 0913  AST 26  ALT 21  ALKPHOS 84  BILITOT 1.0  PROT 7.1  ALBUMIN 4.0   No results for input(s): "LIPASE", "AMYLASE" in the last 168 hours. No results for input(s): "AMMONIA" in the last 168 hours.  ABG    Component Value Date/Time   PHART 7.328 (L) 05/19/2023 1213   PCO2ART 43.5 05/19/2023  1213   PO2ART 164 (H) 05/19/2023 1213   HCO3 23.0 05/19/2023 1213   TCO2 24 05/19/2023 1213   ACIDBASEDEF 3.0 (H) 05/19/2023 1213   O2SAT 99 05/19/2023 1213     Coagulation Profile: Recent Labs  Lab 05/19/23 0913  INR 1.2    Cardiac Enzymes: No results for input(s): "CKTOTAL", "CKMB", "CKMBINDEX", "TROPONINI" in the last 168 hours.  HbA1C: Hemoglobin A1C  Date/Time Value Ref Range Status  03/12/2023 01:57 PM 6.8 (A) 4.0 - 5.6 % Final  08/06/2022 02:31 PM 6.4 (A) 4.0 - 5.6 % Final   Hgb A1c MFr  Bld  Date/Time Value Ref Range Status  04/23/2023 04:45 AM 6.6 (H) 4.8 - 5.6 % Final    Comment:    (NOTE) Pre diabetes:          5.7%-6.4%  Diabetes:              >6.4%  Glycemic control for   <7.0% adults with diabetes   01/29/2022 08:34 AM 6.6 (H) 4.8 - 5.6 % Final    Comment:             Prediabetes: 5.7 - 6.4          Diabetes: >6.4          Glycemic control for adults with diabetes: <7.0     CBG: Recent Labs  Lab 05/19/23 1530 05/19/23 1916 05/19/23 2312 05/20/23 0316 05/20/23 0729  GLUCAP 115* 133* 114* 146* 122*    Review of Systems:   Unable - pt sedated on vent. Hx obtained from records, RN, and wife at bedside.   Past Medical History:  He,  has a past medical history of Actinic keratosis, Atrial fibrillation (HCC), BPH (benign prostatic hyperplasia), Colon polyps, Diabetes mellitus (2004), Diverticulosis, ED (erectile dysfunction), Gallstones, Hearing loss, Hiatal hernia, LAE (left atrial enlargement) (01/2019), Melanoma (HCC), Microalbuminuria, Pneumonia (01/2019), Pure hypercholesterolemia (2006), Traumatic partial tear of biceps tendon (01/2009), Wears glasses, Wears hearing aid in both ears, Wears partial dentures, and Wears partial dentures.   Surgical History:   Past Surgical History:  Procedure Laterality Date   APPENDECTOMY  1955   BILIARY STENT PLACEMENT N/A 01/28/2019   Procedure: BILIARY STENT PLACEMENT;  Surgeon: Willis Modena, MD;   Location: [facility];  Service: Endoscopy;  Laterality: N/A;   CATARACT EXTRACTION Bilateral R 05/07/15, L 05/21/15   Dr. Dione Booze   CHOLECYSTECTOMY N/A 01/27/2019   Procedure: LAPAROSCOPIC CHOLECYSTECTOMY WITH INTRAOPEREATIVE CHOLANGIOGRAM;  Surgeon: Ovidio Kin, MD;  Location: [facility];  Service: General;  Laterality: N/A;   COLONOSCOPY  11/09, 10/29/10   Dr. Ewing Schlein   ERCP N/A 01/28/2019   Procedure: ENDOSCOPIC RETROGRADE CHOLANGIOPANCREATOGRAPHY (ERCP);  Surgeon: Willis Modena, MD;  Location: [facility];  Service: Endoscopy;  Laterality: N/A;   ERCP N/A 04/12/2019   Procedure: ENDOSCOPIC RETROGRADE CHOLANGIOPANCREATOGRAPHY (ERCP);  Surgeon: Vida Rigger, MD;  Location: [facility];  Service: Endoscopy;  Laterality: N/A;  stent removal   ESOPHAGOGASTRODUODENOSCOPY  10/29/10   Dr. Ewing Schlein   INGUINAL HERNIA REPAIR Right 1989   MELANOMA EXCISION  10/08/10   R upper arm, Dr. Daphine Deutscher   MELANOMA EXCISION  03/16/2012   Procedure: MELANOMA EXCISION;  Surgeon: Valarie Merino, MD;  Location: [facility];  Service: General;  Laterality: Right;  excision of melanoma of right arm   REMOVAL OF STONES  01/28/2019   Procedure: REMOVAL OF STONES;  Surgeon: Willis Modena, MD;  Location: [facility];  Service: Endoscopy;;   SPHINCTEROTOMY  01/28/2019   Procedure: Dennison Mascot;  Surgeon: Willis Modena, MD;  Location: [facility];  Service: Endoscopy;;   SPHINCTEROTOMY  04/12/2019   Procedure: Dennison Mascot;  Surgeon: Vida Rigger, MD;  Location: [facility];  Service: Endoscopy;;  stone sludge   STENT REMOVAL  04/12/2019   Procedure: STENT REMOVAL;  Surgeon: Vida Rigger, MD;  Location: [facility];  Service: Endoscopy;;   TOTAL KNEE ARTHROPLASTY Right 2012   TOTAL KNEE ARTHROPLASTY Left 2005     Social History:   reports that he has never smoked. He has never used smokeless tobacco. He reports current alcohol use. He reports that he  does not use drugs.   Family History:  His family history  includes Cancer in his paternal uncle; Diabetes in his brother, sister, and sister; Heart disease in his brother and paternal uncle; Hyperlipidemia in his brother; Hypertension in his brother; Stroke in his brother.   Allergies Allergies  Allergen Reactions   Penicillins Rash     Home Medications  Prior to Admission medications   Medication Sig Start Date End Date Taking? Authorizing Provider  acetaminophen (TYLENOL) 325 MG tablet Take 325-650 mg by mouth every 6 (six) hours as needed for mild pain (pain score 1-3) or moderate pain (pain score 4-6). Patient not taking: Reported on 05/18/2023    [provider]  apixaban Everlene Balls) 5 MG TABS tablet Take 1 tablet by mouth twice daily 04/07/23   [place], Peter M, MD  Ascorbic Acid (VITAMIN C PO) Take 2 tablets by mouth daily.    [provider]  Calcium Carbonate-Vitamin D 600-400 MG-UNIT tablet Take 1 tablet by mouth 2 (two) times daily.    [provider]  Coenzyme Q10 (COQ10) 100 MG CAPS Take 100 mg by mouth daily at 12 noon.    [provider]  fluorouracil (EFUDEX) 5 % cream Apply 1 application  topically as needed (wounds). Patient not taking: Reported on 04/30/2023 05/08/20   [provider]  Glucosamine-Chondroitin (COSAMIN DS PO) Take 1 tablet by mouth 2 (two) times daily. With lunch & supper    [provider]  glucose blood (ONETOUCH VERIO) test strip USE 1 STRIP TO CHECK GLUCOSE ONCE DAILY DX:E11.69 05/17/20   Joselyn Arrow, MD  Lancets [facility] DELICA PLUS [place]) MISC Patient to check blood sugar once daily(usually in the morning). 06/11/22   Joselyn Arrow, MD  loratadine (CLARITIN) 10 MG tablet Take 10 mg by mouth daily.    [provider]  magnesium oxide (MAG-OX) 400 (240 Mg) MG tablet Take 400 mg by mouth daily.    [provider]  metFORMIN (GLUCOPHAGE-XR) 500 MG 24 hr tablet TAKE 3 TABLETS BY MOUTH ONCE DAILY WITH BREAKFAST Patient taking differently: Take by  mouth. Take 1000 mg by mouth in the morning and 500 mg at bedtime. 03/12/23   Joselyn Arrow, MD  Multiple Vitamins-Minerals (MULTIVITAMIN WITH MINERALS) tablet Take 1 tablet by mouth daily.    [provider]  Omega-3 Fatty Acids (FISH OIL PO) Take 1 capsule by mouth daily with supper.    [provider]  simvastatin (ZOCOR) 20 MG tablet Take 1 tablet (20 mg total) by mouth at bedtime. 03/12/23   Joselyn Arrow, MD     Critical care time:     CRITICAL CARE Performed by: Karren Burly   Total critical care time: 32 minutes  Critical care time was exclusive of separately billable procedures and treating other patients.  Critical care was necessary to treat or prevent imminent or life-threatening deterioration.  Critical care was time spent personally by me on the following activities: development of treatment plan with patient and/or surrogate as well as nursing, discussions with consultants, evaluation of patient's response to treatment, examination of patient, obtaining history from patient or surrogate, ordering and performing treatments and interventions, ordering and review of laboratory studies, ordering and review of radiographic studies, pulse oximetry and re-evaluation of patient's condition.   Karren Burly, MD Pulmonary/Critical Care Medicine  05/20/2023  8:52 AM   See [facility] on call pager [phone] until 7pm. Please call Elink 7p-7a. [phone]

## 2023-05-20 NOTE — Progress Notes (Signed)
 NEUROLOGY CONSULT FOLLOW UP NOTE   Date of service: May 20, 2023 Patient Name: Andre Walker MRN:  [id number] DOB:  08/17/1932  Interval Hx/subjective   No further seizures, left side is moving better.  Vitals   Vitals:   05/20/23 1000 05/20/23 1100 05/20/23 1115 05/20/23 1130  BP: (!) 107/90 (!) 103/55 (!) 93/52   Pulse: (!) 41  (!) 45   Resp: 18  16   Temp:    99.7 F (37.6 C)  TempSrc:    Bladder  SpO2: 100%  100%   Weight:      Height:         Body mass index is 26.22 kg/m.  Physical Exam   Constitutional: Appears well-developed and well-nourished.  Neurologic Examination    MS: awakens to minor stimulation.  He follows commands to stick out tongue, close eyes, wiggle toes CN: Eyes are midline, face is relatively symmetric Motor: He does not follow appendicular commands reliably enough to check strength, but he moves both sides fairly well, much better on the left than yesterday Sensory: He responds to minor stimulation bilaterally  Medications  Current Facility-Administered Medications:    acetaminophen (TYLENOL) tablet 650 mg, 650 mg, Per Tube, Q6H PRN, Lynnell Catalan, MD, 650 mg at 05/20/23 0108   cefTRIAXone (ROCEPHIN) 1 g in sodium chloride 0.9 % 100 mL IVPB, 1 g, Intravenous, Q24H, Agarwala, Ravi, MD, Stopped at 05/19/23 1916   Chlorhexidine Gluconate Cloth 2 % PADS 6 each, 6 each, Topical, Daily, Agarwala, Ravi, MD, 6 each at 05/20/23 0141   docusate sodium (COLACE) capsule 100 mg, 100 mg, Oral, BID PRN, Bernadene Person, NP   famotidine (PEPCID) tablet 20 mg, 20 mg, Per Tube, Daily, Agarwala, Ravi, MD, 20 mg at 05/20/23 0901   fentaNYL (SUBLIMAZE) bolus via infusion 25-100 mcg, 25-100 mcg, Intravenous, Q15 min PRN, Lorre Nick, MD, 100 mcg at 05/20/23 1140   fentaNYL in NS (46mcg/ml) infusion-PREMIX, 25-200 mcg/hr, Intravenous, Continuous, Lorre Nick, MD, Paused at 05/20/23 1006   heparin injection 5,000 Units, 5,000 Units,  Subcutaneous, Q8H, Agarwala, Ravi, MD, 5,000 Units at 05/20/23 0543   ibuprofen (ADVIL) 100 MG/5ML suspension 600 mg, 600 mg, Per Tube, Q6H PRN, Ogan, Okoronkwo U, MD, 600 mg at 05/19/23 2323   insulin aspart (novoLOG) injection 0-15 Units, 0-15 Units, Subcutaneous, Q4H, Whiteheart, Kathryn A, NP, 2 Units at 05/20/23 0734   levETIRAcetam (KEPPRA) IVPB 500 mg/100 mL premix, 500 mg, Intravenous, Q12H, Rejeana Brock, MD, Stopped at 05/20/23 [phone]   norepinephrine (LEVOPHED) 4mg  in (0.016 mg/mL) premix infusion, 2-10 mcg/min, Intravenous, Titrated, Hunsucker, Lesia Sago, MD, Paused at 05/20/23 1005   Oral care mouth rinse, 15 mL, Mouth Rinse, Q2H, Agarwala, Ravi, MD, 15 mL at 05/20/23 1127   Oral care mouth rinse, 15 mL, Mouth Rinse, PRN, Agarwala, Ravi, MD   polyethylene glycol (MIRALAX / GLYCOLAX) packet 17 g, 17 g, Oral, Daily PRN, Whiteheart, Kathryn A, NP   propofol (DIPRIVAN) 1000 MG/100ML infusion, 0-50 mcg/kg/min, Intravenous, Continuous, Lorre Nick, MD, Paused at 05/20/23 1006  Labs and Diagnostic Imaging    Imaging(Personally reviewed): MRI brain without clear acute stroke on my review, formal read pending.  He has an old calcified lesion on the right very high   Assessment   Andre Walker is a 89 y.o. male with new onset seizures.  My suspicion is that the episode that he had a month ago was likely also seizure, now that we have further evidence.  He does have a known calcified lesion on the right which is a possible focus, but in any case he will need continued antiepileptic therapy.  He seems to be waking up well and I will continue him on Keppra.  Recommendations  Continue Keppra 500 mg twice daily Repeat CCM assistance with weaning ventilator Magnesium supplementation Would continue EEG for now, can discontinue once extubated and stable off of sedation ______________________________________________________________________   Signed, Ritta Slot,  MD [facility]

## 2023-05-20 NOTE — Progress Notes (Signed)
 LTM EEG discontinued - no skin breakdown at [facility].

## 2023-05-20 NOTE — Procedures (Addendum)
 Patient Name: Andre Walker  MRN: [id number]  Epilepsy Attending: Charlsie Quest  Referring Physician/Provider: Rejeana Brock, MD  Duration: 05/19/2023 1236 to 05/20/2023 0947  Patient history:  89 y.o. male with a history of a single previous episode of syncope versus seizure who presents with witnessed convulsive activity with left gaze deviation. EEG to evaluate for seizure  Level of alertness:  comatose  AEDs during EEG study: LEV, propofol  Technical aspects: This EEG study was done with scalp electrodes positioned according to the 10-20 International system of electrode placement. Electrical activity was reviewed with band pass filter of 1-70Hz , sensitivity of 7 uV/mm, display speed of 51mm/sec with a 60Hz  notched filter applied as appropriate. EEG data were recorded continuously and digitally stored.  Video monitoring was available and reviewed as appropriate.  Description: EEG showed continuous generalized low amplitude 3 to 6 Hz theta-delta slowing. Hyperventilation and photic stimulation were not performed.     ABNORMALITY - Continuous slow, generalized  IMPRESSION: This study is suggestive of severe diffuse encephalopathy, nonspecific etiology but could be related to sedation. No seizures or epileptiform discharges were seen throughout the recording.  [name]

## 2023-05-21 ENCOUNTER — Encounter (HOSPITAL_COMMUNITY)

## 2023-05-21 ENCOUNTER — Inpatient Hospital Stay (HOSPITAL_COMMUNITY)

## 2023-05-21 DIAGNOSIS — R509 Fever, unspecified: Secondary | ICD-10-CM

## 2023-05-21 DIAGNOSIS — R569 Unspecified convulsions: Secondary | ICD-10-CM | POA: Diagnosis not present

## 2023-05-21 LAB — RENAL FUNCTION PANEL
Albumin: 3.1 g/dL — ABNORMAL LOW (ref 3.5–5.0)
Anion gap: 9 (ref 5–15)
BUN: 21 mg/dL (ref 8–23)
CO2: 21 mmol/L — ABNORMAL LOW (ref 22–32)
Calcium: 8.4 mg/dL — ABNORMAL LOW (ref 8.9–10.3)
Chloride: 107 mmol/L (ref 98–111)
Creatinine, Ser: 1.2 mg/dL (ref 0.61–1.24)
GFR, Estimated: 58 mL/min — ABNORMAL LOW (ref >60)
Glucose, Bld: 113 mg/dL — ABNORMAL HIGH (ref 70–99)
Phosphorus: 3.3 mg/dL (ref 2.5–4.6)
Potassium: 4.2 mmol/L (ref 3.5–5.1)
Sodium: 137 mmol/L (ref 135–145)

## 2023-05-21 LAB — CBC
HCT: 41.1 % (ref 39.0–52.0)
Hemoglobin: 13.6 g/dL (ref 13.0–17.0)
MCH: 32 pg (ref 26.0–34.0)
MCHC: 33.1 g/dL (ref 30.0–36.0)
MCV: 96.7 fL (ref 80.0–100.0)
Platelets: 170 10*3/uL (ref 150–400)
RBC: 4.25 MIL/uL (ref 4.22–5.81)
RDW: 12.5 % (ref 11.5–15.5)
WBC: 14.8 10*3/uL — ABNORMAL HIGH (ref 4.0–10.5)
nRBC: 0 % (ref 0.0–0.2)

## 2023-05-21 LAB — GLUCOSE, CAPILLARY
Glucose-Capillary: 95 mg/dL (ref 70–99)
Glucose-Capillary: 97 mg/dL (ref 70–99)
Glucose-Capillary: 146 mg/dL — ABNORMAL HIGH (ref 70–99)
Glucose-Capillary: 158 mg/dL — ABNORMAL HIGH (ref 70–99)
Glucose-Capillary: 174 mg/dL — ABNORMAL HIGH (ref 70–99)
Glucose-Capillary: 186 mg/dL — ABNORMAL HIGH (ref 70–99)

## 2023-05-21 LAB — MAGNESIUM: Magnesium: 1.6 mg/dL — ABNORMAL LOW (ref 1.7–2.4)

## 2023-05-21 MED ORDER — NOREPINEPHRINE 4 MG/250ML-% IV SOLN
1.0000 ug/min | INTRAVENOUS | Status: DC
  Administered 2023-05-21: 2 ug/min via INTRAVENOUS

## 2023-05-21 MED ORDER — ORAL CARE MOUTH RINSE: 15.0000 mL | OROMUCOSAL | Status: DC | PRN

## 2023-05-21 MED ORDER — SODIUM CHLORIDE 0.9 % IV SOLN
500.0000 mg | INTRAVENOUS | Status: AC
  Administered 2023-05-21 – 2023-05-23 (×3): 500 mg via INTRAVENOUS
  Filled 2023-05-21 (×3): qty 5

## 2023-05-21 MED ORDER — MAGNESIUM SULFATE 4 GM/100ML IV SOLN
4.0000 g | Freq: Once | INTRAVENOUS | Status: AC
  Administered 2023-05-21: 4 g via INTRAVENOUS
  Filled 2023-05-21: qty 100

## 2023-05-21 MED ORDER — LACTATED RINGERS IV BOLUS
1000.0000 mL | Freq: Once | INTRAVENOUS | Status: AC
  Administered 2023-05-21: 1000 mL via INTRAVENOUS

## 2023-05-21 MED ORDER — APIXABAN 5 MG PO TABS
5.0000 mg | ORAL_TABLET | Freq: Two times a day (BID) | ORAL | Status: DC
  Administered 2023-05-21 – 2023-05-27 (×11): 5 mg via ORAL
  Filled 2023-05-21 (×13): qty 1

## 2023-05-21 MED ORDER — FAMOTIDINE 20 MG PO TABS: 20.0000 mg | ORAL_TABLET | Freq: Every day | ORAL | Status: DC

## 2023-05-21 MED ORDER — ORAL CARE MOUTH RINSE
15.0000 mL | OROMUCOSAL | Status: DC
  Administered 2023-05-21 – 2023-05-27 (×21): 15 mL via OROMUCOSAL

## 2023-05-21 MED ORDER — GUAIFENESIN 100 MG/5ML PO LIQD
15.0000 mL | Freq: Four times a day (QID) | ORAL | Status: DC
  Administered 2023-05-21 – 2023-05-27 (×16): 15 mL via ORAL
  Filled 2023-05-21 (×18): qty 15

## 2023-05-21 MED ORDER — LEVETIRACETAM 500 MG PO TABS
500.0000 mg | ORAL_TABLET | Freq: Two times a day (BID) | ORAL | Status: DC
  Administered 2023-05-21 – 2023-05-27 (×10): 500 mg via ORAL
  Filled 2023-05-21 (×12): qty 1

## 2023-05-21 MED ORDER — SIMVASTATIN 20 MG PO TABS
20.0000 mg | ORAL_TABLET | Freq: Every day | ORAL | Status: DC
  Administered 2023-05-21 – 2023-05-27 (×5): 20 mg via ORAL
  Filled 2023-05-21 (×7): qty 1

## 2023-05-21 NOTE — Procedures (Signed)
 Modified Barium Swallow Study  Patient Details  Name: Andre Walker MRN: [id number] Date of Birth: 07/23/1932  Today's Date: 05/21/2023  Modified Barium Swallow completed.  Full report located under Chart Review in the Imaging Section.  History of Present Illness Patient is an 89 y.o. male with PMH: DM, a-fib, hiatal hernia, hearing loss (bilateral hearing aids). He presented to the hospital on 05/19/23 with confusion followed by sudden onset of seizure activity. He was transported to the hospital by EMS. In ER he was not protecting his airway and was intubated. He was extubated on 04/22/23 and kept NPO awaiting SLP swallow evaluation.   Clinical Impression Patient presents with an oropharyngeal dysphagia as per this MBS with silent aspiration occuring during and after initiation of swallow with thin liquids. (PAS 8)  Penetration above the vocal cords (PAS 3) and to the vocal cords (PAS 5) that did not clear occured with thin and nectar thick liquids when swallow was delayed to level of pyriform sinus. When swallow was initiated at level of vallecular sinus or posterior laryngeal surface of the epiglottis, no penetration or aspiration observed. Cued cough was effective to expel penetrate however barium transited back into laryngeal vestibule unless swallow was initiated immediately after cough. One instance of silent aspiration of trace amount of nectar thick liquid occured which was preciptated by delayed aspiration of thin liquid residuals. Mild to moderate amount of vallecular residuals and base of tongue residuals remained after initial swallow with solids and liquids but with clearance of some but not all with subsequent swallows. PES opening and bolus transit through PES and upper esophagus was [facility] and no retrograde movement of barium observed.  DIGEST Swallow Severity Rating*  Safety: 2  Efficiency:1  Overall Pharyngeal Swallow Severity: 2 1: mild; 2: moderate; 3: severe; 4: profound   *The  Dynamic Imaging Grade of Swallowing Toxicity is standardized for the head and neck cancer population, however, demonstrates promising clinical applications across populations to standardize the clinical rating of pharyngeal swallow safety and severity.   Factors that may increase risk of adverse event in presence of aspiration Rubye Oaks & Clearance Coots 2021): Frail or deconditioned  Swallow Evaluation Recommendations Recommendations: PO diet PO Diet Recommendation: Dysphagia 2 (Finely chopped);Mildly thick liquids (Level 2, nectar thick) Liquid Administration via: Cup;Straw Medication Administration: Crushed with puree Supervision: Patient able to self-feed;Staff to assist with self-feeding;Full supervision/cueing for swallowing strategies Swallowing strategies  : Minimize environmental distractions;Slow rate;Small bites/sips;Clear throat intermittently Postural changes: Position pt fully upright for meals Oral care recommendations: Oral care BID (2x/day) Caregiver Recommendations: Remove water pitcher;Avoid jello, ice cream, thin soups, popsicles;Have oral suction available     Angela Nevin, MA, CCC-SLP Speech Therapy

## 2023-05-21 NOTE — Progress Notes (Addendum)
 NAME:  Andre Walker, MRN:  [id number], DOB:  December 29, 1932, LOS: 2 ADMISSION DATE:  05/19/2023, CONSULTATION DATE:  3/25  REFERRING MD:  EDP, CHIEF COMPLAINT:  seizure, intubated    History of Present Illness:  89yo male resident of SNF with hx AFib on eliquis, DM presents 3/25 with seizure. Altered this morning, somewhat confused then had sudden onset seizure activity. Had recurrent seizure for EMS during transport with L gaze. On arrival to ER was not protecting his airway and was intubated.  Per wife had admission in February after episode of ?syncope/unresponsiveness where seizure was discussed but EEG did not show seizure.  Other than that has been in his usual state of health. Had outpt appt yesterday for f/u hypoMg - outpt Mg was 1.3 3/24 Pertinent  Medical History   has a past medical history of Actinic keratosis, Atrial fibrillation (HCC), BPH (benign prostatic hyperplasia), Colon polyps, Diabetes mellitus (2004), Diverticulosis, ED (erectile dysfunction), Gallstones, Hearing loss, Hiatal hernia, LAE (left atrial enlargement) (01/2019), Melanoma (HCC), Microalbuminuria, Pneumonia (01/2019), Pure hypercholesterolemia (2006), Traumatic partial tear of biceps tendon (01/2009), Wears glasses, Wears hearing aid in both ears, Wears partial dentures, and Wears partial dentures.   Significant Hospital Events: Including procedures, antibiotic start and stop dates in addition to other pertinent events   CT head 3/25>> neg acute  CTA head/neck 3/25>>>Negative for large vessel occlusion.  2. Generalized intracranial artery tortuosity but no significant intracranial atherosclerosis. But positive for Bulky Extracranial Atherosclerosis with multifocal High-grade stenoses: - Severe stenosis Right ICA origin approaching a RADIOGRAPHIC STRING SIGN. - superimposed 65% stenosis mid Right CCA. - 70% stenosis proximal Left Subclavian Artery. - Moderate to Severe stenosis at Both Vertebral Artery  origins. 3/26 MRI ok no seizure EEG extubated  Interim History / Subjective:  Extubated yday. Speech eval today.  Objective   Blood pressure (!) 98/51, pulse 63, temperature 99.3 F (37.4 C), temperature source Bladder, resp. rate 20, height 5\' 10"  (1.778 m), weight 86 kg, SpO2 95%.    Vent Mode: Stand-by FiO2 (%):  [45 %] 45 % Set Rate:  [18 bmp] 18 bmp Vt Set:  [580 mL] 580 mL PEEP:  [5 cmH20] 5 cmH20 Pressure Support:  [5 cmH20] 5 cmH20 Plateau Pressure:  [13 cmH20] 13 cmH20   Intake/Output Summary (Last 24 hours) at 05/21/2023 0914 Last data filed at 05/21/2023 0900 Gross per 24 hour  Intake 1075.45 ml  Output 1000 ml  Net 75.45 ml   Filed Weights   05/19/23 1140 05/20/23 0143 05/21/23 0442  Weight: 83.6 kg 82.9 kg 86 kg    Examination: General: chronically ill appearing elderly male, lying in bed HENT: AT/[place] Lungs: diminished coarse Cardiovascular: s1s2 rrr Abdomen: round, soft Extremities: warm and dry Neuro: Arousable, no deficits noted  Resolved Hospital Problem list     Assessment & Plan:  Seizure  AMS  P:  Neuro following  Overnight EEG no seizures Keppra  MRI ok  Respiratory failure - intubated for airway protection in setting seizure/AMS P:  Extubated 3/26 Wean O2 as able, goal sat 90%  AFib on eliquis  P:  Eliquis Rate controlled without meds  Hypomagnesemia P:  Replace  DM  P:  Hold metformin  SSI   Bradycardia -unclear if develop before or after dexmedetomidine but likely dexmedetomidine making things worse P:  Stop dexemedetomidine and this imrpoved  Hypotension: With increasing sedation likely hypovolemic. P: Improved after fluids 3/26  Fever: Presumed related to CAP --CTX x 5 days, Azithro added  3/27 with ongoing fever plan 3 days  Best Practice (right click and "Reselect all SmartList Selections" daily)   Diet/type: NPO DVT prophylaxis SCD Pressure ulcer(s): N/A GI prophylaxis: H2B Lines: N/A Foley:  N/A Code  Status:  full code Last date of multidisciplinary goals of care discussion [3/25] Discussed at length with wife at bedside in ER. Full code but wife confirms pt has living will and would not want prolonged support.   Labs   CBC: Recent Labs  Lab 05/19/23 0913 05/19/23 0922 05/19/23 1005 05/19/23 1213 05/20/23 1436 05/21/23 0320  WBC 11.4*  --   --   --  14.9* 14.8*  NEUTROABS 7.3  --   --   --   --   --   HGB 14.1 15.0 12.9* 12.9* 14.3 13.6  HCT 44.7 44.0 38.0* 38.0* 43.2 41.1  MCV 102.3*  --   --   --  97.3 96.7  PLT 286  --   --   --  249 170    Basic Metabolic Panel: Recent Labs  Lab 05/18/23 1408 05/18/23 1408 05/19/23 0913 05/19/23 0922 05/19/23 1005 05/19/23 1210 05/19/23 1213 05/19/23 1552 05/20/23 1436 05/21/23 0426  NA 141   < > 137 138 137  --  137 136 138 137  K 4.6  --  3.9 3.9 4.2  --  4.3 4.3 3.9 4.2  CL 103  --  101 103  --   --   --  106 103 107  CO2 22  --  15*  --   --   --   --  22 22 21*  GLUCOSE 143*  --  226* 217*  --   --   --  113* 121* 113*  BUN 26  --  22 23  --   --   --  21 25* 21  CREATININE 0.94  --  1.29* 1.10  --   --   --  1.00 1.17 1.20  CALCIUM 9.8  --  9.2  --   --   --   --  8.8* 8.9 8.4*  MG 1.3*  --   --   --   --  1.4*  --   --  1.6* 1.6*  PHOS  --   --   --   --   --   --   --   --  3.3 3.3   < > = values in this interval not displayed.   GFR: Estimated Creatinine Clearance: 43.1 mL/min (by C-G formula based on SCr of 1.2 mg/dL). Recent Labs  Lab 05/19/23 0913 05/19/23 1210 05/19/23 1424 05/20/23 1436 05/20/23 1952 05/21/23 0320  WBC 11.4*  --   --  14.9*  --  14.8*  LATICACIDVEN  --  3.6* 3.2*  --  3.0*  --     Liver Function Tests: Recent Labs  Lab 05/19/23 0913 05/21/23 0426  AST 26  --   ALT 21  --   ALKPHOS 84  --   BILITOT 1.0  --   PROT 7.1  --   ALBUMIN 4.0 3.1*   No results for input(s): "LIPASE", "AMYLASE" in the last 168 hours. No results for input(s): "AMMONIA" in the last 168 hours.  ABG     Component Value Date/Time   PHART 7.328 (L) 05/19/2023 1213   PCO2ART 43.5 05/19/2023 1213   PO2ART 164 (H) 05/19/2023 1213   HCO3 23.0 05/19/2023 1213   TCO2 24 05/19/2023 1213   ACIDBASEDEF 3.0 (  H) 05/19/2023 1213   O2SAT 99 05/19/2023 1213     Coagulation Profile: Recent Labs  Lab 05/19/23 0913  INR 1.2    Cardiac Enzymes: No results for input(s): "CKTOTAL", "CKMB", "CKMBINDEX", "TROPONINI" in the last 168 hours.  HbA1C: Hemoglobin A1C  Date/Time Value Ref Range Status  03/12/2023 01:57 PM 6.8 (A) 4.0 - 5.6 % Final  08/06/2022 02:31 PM 6.4 (A) 4.0 - 5.6 % Final   Hgb A1c MFr Bld  Date/Time Value Ref Range Status  04/23/2023 04:45 AM 6.6 (H) 4.8 - 5.6 % Final    Comment:    (NOTE) Pre diabetes:          5.7%-6.4%  Diabetes:              >6.4%  Glycemic control for   <7.0% adults with diabetes   01/29/2022 08:34 AM 6.6 (H) 4.8 - 5.6 % Final    Comment:             Prediabetes: 5.7 - 6.4          Diabetes: >6.4          Glycemic control for adults with diabetes: <7.0     CBG: Recent Labs  Lab 05/20/23 1520 05/20/23 1925 05/20/23 2339 05/21/23 0415 05/21/23 0727  GLUCAP 113* 126* 84 95 97    Review of Systems:   Unable - pt sedated on vent. Hx obtained from records, RN, and wife at bedside.   Past Medical History:  He,  has a past medical history of Actinic keratosis, Atrial fibrillation (HCC), BPH (benign prostatic hyperplasia), Colon polyps, Diabetes mellitus (2004), Diverticulosis, ED (erectile dysfunction), Gallstones, Hearing loss, Hiatal hernia, LAE (left atrial enlargement) (01/2019), Melanoma (HCC), Microalbuminuria, Pneumonia (01/2019), Pure hypercholesterolemia (2006), Traumatic partial tear of biceps tendon (01/2009), Wears glasses, Wears hearing aid in both ears, Wears partial dentures, and Wears partial dentures.   Surgical History:   Past Surgical History:  Procedure Laterality Date   APPENDECTOMY  1955   BILIARY STENT PLACEMENT N/A  01/28/2019   Procedure: BILIARY STENT PLACEMENT;  Surgeon: Willis Modena, MD;  Location: [facility];  Service: Endoscopy;  Laterality: N/A;   CATARACT EXTRACTION Bilateral R 05/07/15, L 05/21/15   Dr. Dione Booze   CHOLECYSTECTOMY N/A 01/27/2019   Procedure: LAPAROSCOPIC CHOLECYSTECTOMY WITH INTRAOPEREATIVE CHOLANGIOGRAM;  Surgeon: Ovidio Kin, MD;  Location: [facility];  Service: General;  Laterality: N/A;   COLONOSCOPY  11/09, 10/29/10   Dr. Ewing Schlein   ERCP N/A 01/28/2019   Procedure: ENDOSCOPIC RETROGRADE CHOLANGIOPANCREATOGRAPHY (ERCP);  Surgeon: Willis Modena, MD;  Location: [facility];  Service: Endoscopy;  Laterality: N/A;   ERCP N/A 04/12/2019   Procedure: ENDOSCOPIC RETROGRADE CHOLANGIOPANCREATOGRAPHY (ERCP);  Surgeon: Vida Rigger, MD;  Location: [facility];  Service: Endoscopy;  Laterality: N/A;  stent removal   ESOPHAGOGASTRODUODENOSCOPY  10/29/10   Dr. Ewing Schlein   INGUINAL HERNIA REPAIR Right 1989   MELANOMA EXCISION  10/08/10   R upper arm, Dr. Daphine Deutscher   MELANOMA EXCISION  03/16/2012   Procedure: MELANOMA EXCISION;  Surgeon: Valarie Merino, MD;  Location: [facility];  Service: General;  Laterality: Right;  excision of melanoma of right arm   REMOVAL OF STONES  01/28/2019   Procedure: REMOVAL OF STONES;  Surgeon: Willis Modena, MD;  Location: [facility];  Service: Endoscopy;;   SPHINCTEROTOMY  01/28/2019   Procedure: Dennison Mascot;  Surgeon: Willis Modena, MD;  Location: [facility];  Service: Endoscopy;;   SPHINCTEROTOMY  04/12/2019   Procedure: SPHINCTEROTOMY;  Surgeon:  Vida Rigger, MD;  Location: [facility];  Service: Endoscopy;;  stone sludge   STENT REMOVAL  04/12/2019   Procedure: STENT REMOVAL;  Surgeon: Vida Rigger, MD;  Location: [facility];  Service: Endoscopy;;   TOTAL KNEE ARTHROPLASTY Right 2012   TOTAL KNEE ARTHROPLASTY Left 2005     Social History:   reports that he has never smoked. He has never used smokeless tobacco. He reports current alcohol use.  He reports that he does not use drugs.   Family History:  His family history includes Cancer in his paternal uncle; Diabetes in his brother, sister, and sister; Heart disease in his brother and paternal uncle; Hyperlipidemia in his brother; Hypertension in his brother; Stroke in his brother.   Allergies Allergies  Allergen Reactions   Penicillins Rash     Home Medications  Prior to Admission medications   Medication Sig Start Date End Date Taking? Authorizing Provider  acetaminophen (TYLENOL) 325 MG tablet Take 325-650 mg by mouth every 6 (six) hours as needed for mild pain (pain score 1-3) or moderate pain (pain score 4-6). Patient not taking: Reported on 05/18/2023    [provider]  apixaban Everlene Balls) 5 MG TABS tablet Take 1 tablet by mouth twice daily 04/07/23   [place], Peter M, MD  Ascorbic Acid (VITAMIN C PO) Take 2 tablets by mouth daily.    [provider]  Calcium Carbonate-Vitamin D 600-400 MG-UNIT tablet Take 1 tablet by mouth 2 (two) times daily.    [provider]  Coenzyme Q10 (COQ10) 100 MG CAPS Take 100 mg by mouth daily at 12 noon.    [provider]  fluorouracil (EFUDEX) 5 % cream Apply 1 application  topically as needed (wounds). Patient not taking: Reported on 04/30/2023 05/08/20   [provider]  Glucosamine-Chondroitin (COSAMIN DS PO) Take 1 tablet by mouth 2 (two) times daily. With lunch & supper    [provider]  glucose blood (ONETOUCH VERIO) test strip USE 1 STRIP TO CHECK GLUCOSE ONCE DAILY DX:E11.69 05/17/20   Joselyn Arrow, MD  Lancets [facility] DELICA PLUS [place]) MISC Patient to check blood sugar once daily(usually in the morning). 06/11/22   Joselyn Arrow, MD  loratadine (CLARITIN) 10 MG tablet Take 10 mg by mouth daily.    [provider]  magnesium oxide (MAG-OX) 400 (240 Mg) MG tablet Take 400 mg by mouth daily.    [provider]  metFORMIN (GLUCOPHAGE-XR) 500 MG 24 hr tablet TAKE 3  TABLETS BY MOUTH ONCE DAILY WITH BREAKFAST Patient taking differently: Take by mouth. Take 1000 mg by mouth in the morning and 500 mg at bedtime. 03/12/23   Joselyn Arrow, MD  Multiple Vitamins-Minerals (MULTIVITAMIN WITH MINERALS) tablet Take 1 tablet by mouth daily.    [provider]  Omega-3 Fatty Acids (FISH OIL PO) Take 1 capsule by mouth daily with supper.    [provider]  simvastatin (ZOCOR) 20 MG tablet Take 1 tablet (20 mg total) by mouth at bedtime. 03/12/23   Joselyn Arrow, MD     Critical care time: n/a    Karren Burly, MD Pulmonary/Critical Care Medicine  05/21/2023  9:14 AM   See [facility] on call pager [phone] until 7pm. Please call Elink 7p-7a. [phone]

## 2023-05-21 NOTE — Evaluation (Signed)
 Clinical/Bedside Swallow Evaluation Patient Details  Name: Andre Walker MRN: [id number] Date of Birth: 11-Mar-1932  Today's Date: 05/21/2023 Time: SLP Start Time (ACUTE ONLY): 0910 SLP Stop Time (ACUTE ONLY): 0930 SLP Time Calculation (min) (ACUTE ONLY): 20 min  Past Medical History:  Past Medical History:  Diagnosis Date   Actinic keratosis    Dr. Terri Piedra   Atrial fibrillation [facility])    BPH (benign prostatic hyperplasia)    controlled on Hytrin   Colon polyps    Dr. Ewing Schlein   Diabetes mellitus 2004   Diverticulosis    ED (erectile dysfunction)    Gallstones    Hearing loss    high frequency   Hiatal hernia    small, noted on EGD 10/2010   LAE (left atrial enlargement) 01/2019   Moderate, Noted on ECHO   Melanoma (HCC)    right arm   Microalbuminuria    on ACEI for microalbuminuria (does NOT have HTN)   Pneumonia 01/2019   Bilateral, noted on CT   Pure hypercholesterolemia 2006   Traumatic partial tear of biceps tendon 01/2009   "popeye" injury on right   Wears glasses    Wears hearing aid in both ears    Wears partial dentures    lower   Wears partial dentures    lower   Past Surgical History:  Past Surgical History:  Procedure Laterality Date   APPENDECTOMY  1955   BILIARY STENT PLACEMENT N/A 01/28/2019   Procedure: BILIARY STENT PLACEMENT;  Surgeon: Willis Modena, MD;  Location: [facility];  Service: Endoscopy;  Laterality: N/A;   CATARACT EXTRACTION Bilateral R 05/07/15, L 05/21/15   Dr. Dione Booze   CHOLECYSTECTOMY N/A 01/27/2019   Procedure: LAPAROSCOPIC CHOLECYSTECTOMY WITH INTRAOPEREATIVE CHOLANGIOGRAM;  Surgeon: Ovidio Kin, MD;  Location: [facility];  Service: General;  Laterality: N/A;   COLONOSCOPY  11/09, 10/29/10   Dr. Ewing Schlein   ERCP N/A 01/28/2019   Procedure: ENDOSCOPIC RETROGRADE CHOLANGIOPANCREATOGRAPHY (ERCP);  Surgeon: Willis Modena, MD;  Location: [facility];  Service: Endoscopy;  Laterality: N/A;   ERCP N/A 04/12/2019   Procedure: ENDOSCOPIC  RETROGRADE CHOLANGIOPANCREATOGRAPHY (ERCP);  Surgeon: Vida Rigger, MD;  Location: [facility];  Service: Endoscopy;  Laterality: N/A;  stent removal   ESOPHAGOGASTRODUODENOSCOPY  10/29/10   Dr. Ewing Schlein   INGUINAL HERNIA REPAIR Right 1989   MELANOMA EXCISION  10/08/10   R upper arm, Dr. Daphine Deutscher   MELANOMA EXCISION  03/16/2012   Procedure: MELANOMA EXCISION;  Surgeon: Valarie Merino, MD;  Location: [facility];  Service: General;  Laterality: Right;  excision of melanoma of right arm   REMOVAL OF STONES  01/28/2019   Procedure: REMOVAL OF STONES;  Surgeon: Willis Modena, MD;  Location: [facility];  Service: Endoscopy;;   SPHINCTEROTOMY  01/28/2019   Procedure: Dennison Mascot;  Surgeon: Willis Modena, MD;  Location: [facility];  Service: Endoscopy;;   SPHINCTEROTOMY  04/12/2019   Procedure: Dennison Mascot;  Surgeon: Vida Rigger, MD;  Location: [facility];  Service: Endoscopy;;  stone sludge   STENT REMOVAL  04/12/2019   Procedure: STENT REMOVAL;  Surgeon: Vida Rigger, MD;  Location: [facility];  Service: Endoscopy;;   TOTAL KNEE ARTHROPLASTY Right 2012   TOTAL KNEE ARTHROPLASTY Left 2005   HPI:  Patient is an 89 y.o. male with PMH: DM, a-fib, hiatal hernia, hearing loss (bilateral hearing aids). He presented to the hospital on 05/19/23 with confusion followed by sudden onset of seizure activity. He was transported to the hospital by EMS. In ER  he was not protecting his airway and was intubated. He was extubated on 04/22/23 and kept NPO awaiting SLP swallow evaluation.    Assessment / Plan / Recommendation  Clinical Impression  Patient is presenting with clinical s/s of dysphagia as per this BSE. He was awake and alert, sitting up in recliner chair, but appeared drowsy. He required assistance to manage holding cup and utensil but was then able to feed himself. Delayed cough response after sips of water was observed three times during PO intake with cough sounding congested. (was  not productive) Voice and vitals remained clear. Patient with some anterior spillage with purees which was a result of him having difficulty fully bringing spoon to mouth, as well as his posture with head tilting downwards at times. He exhibited full oral clearance with puree and regular solids. SLP recommending MBS to objectively evaluate swallow function, but as this cannot be completed until this PM, recommending PO diet of Dys 2 (minced) solids and thin liquids. SLP Visit Diagnosis: Dysphagia, unspecified (R13.10)    Aspiration Risk  Mild aspiration risk    Diet Recommendation Dysphagia 2 (Fine chop);Thin liquid    Liquid Administration via: Cup;Straw Medication Administration: Crushed with puree Supervision: Patient able to self feed;Full supervision/cueing for compensatory strategies Compensations: Slow rate;Small sips/bites Postural Changes: Seated upright at 90 degrees    Other  Recommendations Oral Care Recommendations: Oral care BID    Recommendations for follow up therapy are one component of a multi-disciplinary discharge planning process, led by the attending physician.  Recommendations may be updated based on patient status, additional functional criteria and insurance authorization.  Follow up Recommendations Other (comment) (TBD)      Assistance Recommended at Discharge    Functional Status Assessment Patient has had a recent decline in their functional status and demonstrates the ability to make significant improvements in function in a reasonable and predictable amount of time.  Frequency and Duration min 2x/week  1 week       Prognosis Prognosis for improved oropharyngeal function: Good      Swallow Study   General Date of Onset: 05/19/23 HPI: Patient is an 89 y.o. male with PMH: DM, a-fib, hiatal hernia, hearing loss (bilateral hearing aids). He presented to the hospital on 05/19/23 with confusion followed by sudden onset of seizure activity. He was transported  to the hospital by EMS. In ER he was not protecting his airway and was intubated. He was extubated on 04/22/23 and kept NPO awaiting SLP swallow evaluation. Type of Study: Bedside Swallow Evaluation Previous Swallow Assessment: none found Diet Prior to this Study: NPO Temperature Spikes Noted: No Respiratory Status: Room air History of Recent Intubation: Yes Total duration of intubation (days): 2 days Date extubated: 05/20/23 Behavior/Cognition: Alert;Cooperative;Lethargic/Drowsy Oral Cavity Assessment: Within Functional Limits Oral Care Completed by SLP: No Oral Cavity - Dentition: Adequate natural dentition;Missing dentition;Other (Comment) (reports he has bottom partials which are likely at home) Self-Feeding Abilities: Able to feed self;Needs set up;Needs assist Patient Positioning: Upright in chair Baseline Vocal Quality: Normal Volitional Cough: Cognitively unable to elicit Volitional Swallow: Unable to elicit    Oral/Motor/Sensory Function Overall Oral Motor/Sensory Function: Within functional limits   Ice Chips     Thin Liquid Thin Liquid: Impaired Presentation: Straw;Self Fed Pharyngeal  Phase Impairments: Suspected delayed Swallow;Cough - Delayed    Nectar Thick     Honey Thick     Puree Puree: Impaired Oral Phase Functional Implications: Right anterior spillage;Left anterior spillage   Solid  Solid: Impaired Oral Phase Impairments: Impaired mastication     Angela Nevin, MA, CCC-SLP Speech Therapy

## 2023-05-21 NOTE — Progress Notes (Signed)
 [facility] ADULT ICU REPLACEMENT PROTOCOL   The patient does apply for the [facility] Adult ICU Electrolyte Replacment Protocol based on the criteria listed below:   1.Exclusion criteria: TCTS, ECMO, Dialysis, and Myasthenia Gravis patients 2. Is GFR >/= 30 ml/min? Yes.    Patient's GFR today is 58 3. Is SCr </= 2? Yes.   Patient's SCr is 1.20 mg/dL 4. Did SCr increase >/= 0.5 in 24 hours? No. 5.Pt's weight >40kg  Yes.   6. Abnormal electrolyte(s): Mg = 1.6  7. Electrolytes replaced per protocol 8.  Call MD STAT for K+ </= 2.5, Phos </= 1, or Mag </= 1 Physician:  Warrick Parisian, eMD    Andre Walker 05/21/2023 5:14 AM

## 2023-05-21 NOTE — Evaluation (Signed)
 Physical Therapy Evaluation Patient Details Name: Andre Walker MRN: [id number] DOB: August 20, 1932 Today's Date: 05/21/2023  History of Present Illness  89 y.o. male admitted 3/25 with Sz at home (ILF) and repeat sz enroute. Intubated 3/25-3/26. PMhx: AFib on eliquis, DM, HLD, HOH, diverticulosis, BPH  Clinical Impression  [place] pleasant, lethargic and demonstrates impaired cognition, mobility and function. Pt familiar from prior admission at which time he could walk 400' with RW. Pt given medication last night that appears to still be impacting cognition and feel pt will progress in all aspects once more cognitively intact. At this time pt requires increased assist beyond just wife at ILF. Pt will benefit from acute therapy to maximize strength, function, transfers and gait to decrease burden of care.   HR 73 SPO2 96% on RA2        If plan is discharge home, recommend the following: A lot of help with walking and/or transfers;A lot of help with bathing/dressing/bathroom;Assistance with feeding;Assistance with cooking/housework;Assist for transportation;Supervision due to cognitive status   Can travel by private vehicle   No    Equipment Recommendations None recommended by PT  Recommendations for Other Services  OT consult    Functional Status Assessment Patient has had a recent decline in their functional status and demonstrates the ability to make significant improvements in function in a reasonable and predictable amount of time.     Precautions / Restrictions Precautions Precautions: Fall Recall of Precautions/Restrictions: Impaired      Mobility  Bed Mobility Overal bed mobility: Needs Assistance Bed Mobility: Supine to Sit     Supine to sit: Min assist, HOB elevated     General bed mobility comments: HOB 25 degrees with min assist to pivot to EOB and elevate trunk, pt with right lean in sitting    Transfers Overall transfer level: Needs assistance   Transfers: Sit  to/from Stand Sit to Stand: Min assist           General transfer comment: min assist to stand from bed with cues for hand placement, safety and sequence    Ambulation/Gait Ambulation/Gait assistance: Min assist Gait Distance (Feet): 8 Feet Assistive device: Rolling walker (2 wheels) Gait Pattern/deviations: Trunk flexed, Shuffle, Narrow base of support   Gait velocity interpretation: <1.31 ft/sec, indicative of household ambulator   General Gait Details: pt with flexed trunk and shuffling gait needing physical assist for balance and to direct RW, limited by fatigue and decreased cognition  Stairs            Wheelchair Mobility     Tilt Bed    Modified Rankin (Stroke Patients Only)       Balance Overall balance assessment: Needs assistance   Sitting balance-Leahy Scale: Poor Sitting balance - Comments: right lean in sitting needing min assist   Standing balance support: Bilateral upper extremity supported, During functional activity, Reliant on assistive device for balance Standing balance-Leahy Scale: Poor Standing balance comment: reliant on RW and physical assist                             Pertinent Vitals/Pain Pain Assessment Pain Assessment: No/denies pain    Home Living Family/patient expects to be discharged to:: Private residence Living Arrangements: Spouse/significant other Available Help at Discharge: Family;Available 24 hours/day Type of Home: Apartment Home Access: Elevator       Home Layout: One level Home Equipment: Agricultural consultant (2 wheels);Cane - single point  Prior Function Prior Level of Function : Driving;Independent/Modified Independent             Mobility Comments: pt was walking with RW since last admission, using electric cart while shopping. Still driving wants to return to golf ADLs Comments: completes ADLs independently     Extremity/Trunk Assessment   Upper Extremity Assessment Upper Extremity  Assessment: Overall WFL for tasks assessed    Lower Extremity Assessment Lower Extremity Assessment: Overall WFL for tasks assessed    Cervical / Trunk Assessment Cervical / Trunk Assessment: Kyphotic  Communication   Communication Communication: Impaired Factors Affecting Communication: Hearing impaired    Cognition Arousal: Lethargic Behavior During Therapy: Flat affect   PT - Cognitive impairments: Orientation, Memory, Problem solving, Safety/Judgement   Orientation impairments: Time                     Following commands: Impaired Following commands impaired: Follows one step commands with increased time     Cueing Cueing Techniques: Verbal cues, Gestural cues     General Comments      Exercises     Assessment/Plan    PT Assessment Patient needs continued PT services  PT Problem List Decreased strength;Decreased coordination;Decreased cognition;Decreased activity tolerance;Decreased balance;Decreased mobility;Decreased safety awareness;Decreased knowledge of use of DME       PT Treatment Interventions Gait training;Functional mobility training;Therapeutic activities;Therapeutic exercise;Patient/family education;Cognitive remediation;Neuromuscular re-education;Balance training;DME instruction    PT Goals (Current goals can be found in the Care Plan section)  Acute Rehab PT Goals Patient Stated Goal: return home and to golf PT Goal Formulation: With patient Time For Goal Achievement: 06/04/23 Potential to Achieve Goals: Good    Frequency Min 2X/week     Co-evaluation               AM-PAC PT "6 Clicks" Mobility  Outcome Measure Help needed turning from your back to your side while in a flat bed without using bedrails?: A Little Help needed moving from lying on your back to sitting on the side of a flat bed without using bedrails?: A Little Help needed moving to and from a bed to a chair (including a wheelchair)?: A Little Help needed  standing up from a chair using your arms (e.g., wheelchair or bedside chair)?: A Little Help needed to walk in hospital room?: A Lot Help needed climbing 3-5 steps with a railing? : Total 6 Click Score: 15    End of Session Equipment Utilized During Treatment: Gait belt Activity Tolerance: Patient tolerated treatment well Patient left: in chair;with call bell/phone within reach;with chair alarm set;with nursing/sitter in room Nurse Communication: Mobility status PT Visit Diagnosis: Other abnormalities of gait and mobility (R26.89);Difficulty in walking, not elsewhere classified (R26.2)    Time: 1610-9604 PT Time Calculation (min) (ACUTE ONLY): 24 min   Charges:   PT Evaluation $PT Eval Moderate Complexity: 1 Mod PT Treatments $Therapeutic Activity: 8-22 mins PT General Charges $$ ACUTE PT VISIT: 1 Visit         Merryl Hacker, PT Acute Rehabilitation Services Office: [phone]   Enedina Finner Darshana Curnutt 05/21/2023, 8:24 AM

## 2023-05-21 NOTE — Progress Notes (Signed)
 NEUROLOGY CONSULT FOLLOW UP NOTE   Date of service: May 21, 2023 Patient Name: Andre Walker MRN:  [id number] DOB:  03-12-1932  Interval Hx/subjective   Much improved, he does have some tremor at baseline which is worsened when he is cold. Vitals   Vitals:   05/21/23 1130 05/21/23 1145 05/21/23 1200 05/21/23 1215  BP: (!) 95/44 108/66 (!) 119/56 (!) 122/54  Pulse: 65 68 69 69  Resp: 18 18 20 20   Temp:  98.8 F (37.1 C) 99 F (37.2 C) 98.6 F (37 C)  TempSrc:   Bladder   SpO2: 97% 97% 97% 97%  Weight:      Height:         Body mass index is 27.2 kg/m.  Physical Exam   Constitutional: Appears well-developed and well-nourished.  Neurologic Examination    MS: He is awake, oriented, interactive and appropriate CN: Visual fields are full, face is symmetric Motor: He is able to hold both arms aloft without drift, he does have a mild postural tremor.  Able to lift both legs against gravity as well. Sensory: Endorses symmetric sensation  Medications  Current Facility-Administered Medications:    acetaminophen (TYLENOL) tablet 650 mg, 650 mg, Per Tube, Q6H PRN, Lynnell Catalan, MD, 650 mg at 05/20/23 0108   apixaban (ELIQUIS) tablet 5 mg, 5 mg, Oral, BID, Hunsucker, Lesia Sago, MD, 5 mg at 05/21/23 7829   azithromycin (ZITHROMAX) 500 mg in sodium chloride 0.9 % 250 mL IVPB, 500 mg, Intravenous, Q24H, Hunsucker, Lesia Sago, MD, Stopped at 05/21/23 1037   cefTRIAXone (ROCEPHIN) 1 g in sodium chloride 0.9 % 100 mL IVPB, 1 g, Intravenous, Q24H, Hunsucker, Lesia Sago, MD, Stopped at 05/20/23 2008   Chlorhexidine Gluconate Cloth 2 % PADS 6 each, 6 each, Topical, Daily, Agarwala, Daleen Bo, MD, 6 each at 05/20/23 2200   docusate sodium (COLACE) capsule 100 mg, 100 mg, Oral, BID PRN, Whiteheart, Kathryn A, NP   insulin aspart (novoLOG) injection 0-15 Units, 0-15 Units, Subcutaneous, Q4H, Whiteheart, Kathryn A, NP, 2 Units at 05/21/23 1142   ipratropium-albuterol (DUONEB) 0.5-2.5 (3) MG/3ML  nebulizer solution 3 mL, 3 mL, Nebulization, Q4H PRN, Hunsucker, Lesia Sago, MD, 3 mL at 05/21/23 0813   levETIRAcetam (KEPPRA) tablet 500 mg, 500 mg, Oral, BID, Hunsucker, Lesia Sago, MD   norepinephrine (LEVOPHED) 4mg  in (0.016 mg/mL) premix infusion, 1-10 mcg/min, Intravenous, Titrated, Hunsucker, Lesia Sago, MD, Last Rate: 7.5 mL/hr at 05/21/23 1200, 2 mcg/min at 05/21/23 1200   OLANZapine (ZYPREXA) injection 2.5 mg, 2.5 mg, Intramuscular, Q6H PRN, Warrick Parisian, [place]wo U, MD, 2.5 mg at 05/20/23 1954   Oral care mouth rinse, 15 mL, Mouth Rinse, PRN, Hunsucker, Lesia Sago, MD   polyethylene glycol (MIRALAX / GLYCOLAX) packet 17 g, 17 g, Oral, Daily PRN, Whiteheart, Mellody Life, NP   simvastatin (ZOCOR) tablet 20 mg, 20 mg, Oral, q1800, Hunsucker, Lesia Sago, MD   sodium chloride HYPERTONIC 3 % nebulizer solution 4 mL, 4 mL, Nebulization, TID, Hunsucker, Lesia Sago, MD, 4 mL at 05/21/23 0813  Labs and Diagnostic Imaging    Imaging(Personally reviewed): MRI brain is negative for acute findings, possible mild asymmetrical FLAIR signal could be secondary to his seizures.  Assessment   Andre Walker is a 89 y.o. male with new onset seizures.  My suspicion is that the episode that he had a month ago was likely also seizure, now that we have further evidence.  At this point, I would favor continued antiepileptic therapy.  With his return  to baseline, no further workup is needed at this time.  Recommendations  Continue Keppra 500 mg twice daily I have requested outpatient neurology follow-up I have informed the patient that he is not allowed to drive by law for period of 6 months from the most recent seizure. Neurology will be available on an as-needed basis ______________________________________________________________________   Signed, Ritta Slot, MD [facility]

## 2023-05-21 NOTE — Progress Notes (Signed)
 OT Cancellation Note  Patient Details Name: Andre Walker MRN: [id number] DOB: 1932/06/14   Cancelled Treatment:    Reason Eval/Treat Not Completed: Patient at procedure or test/ unavailable.  Continue efforts as appropriate.    Dandy Lazaro D Michele Judy 05/21/2023, 10:11 AM 05/21/2023  RP, OTR/L  Acute Rehabilitation Services  Office:  [phone]

## 2023-05-22 LAB — BASIC METABOLIC PANEL WITH GFR
Anion gap: 7 (ref 5–15)
BUN: 24 mg/dL — ABNORMAL HIGH (ref 8–23)
CO2: 23 mmol/L (ref 22–32)
Calcium: 8.4 mg/dL — ABNORMAL LOW (ref 8.9–10.3)
Chloride: 108 mmol/L (ref 98–111)
Creatinine, Ser: 0.96 mg/dL (ref 0.61–1.24)
GFR, Estimated: >60 mL/min (ref >60)
Glucose, Bld: 130 mg/dL — ABNORMAL HIGH (ref 70–99)
Potassium: 3.6 mmol/L (ref 3.5–5.1)
Sodium: 138 mmol/L (ref 135–145)

## 2023-05-22 LAB — HEPATIC FUNCTION PANEL
ALT: 17 U/L (ref 0–44)
AST: 22 U/L (ref 15–41)
Albumin: 2.9 g/dL — ABNORMAL LOW (ref 3.5–5.0)
Alkaline Phosphatase: 72 U/L (ref 38–126)
Bilirubin, Direct: 0.2 mg/dL (ref 0.0–0.2)
Indirect Bilirubin: 0.8 mg/dL (ref 0.3–0.9)
Total Bilirubin: 1 mg/dL (ref 0.0–1.2)
Total Protein: 5.7 g/dL — ABNORMAL LOW (ref 6.5–8.1)

## 2023-05-22 LAB — CBC
HCT: 36.3 % — ABNORMAL LOW (ref 39.0–52.0)
Hemoglobin: 12.6 g/dL — ABNORMAL LOW (ref 13.0–17.0)
MCH: 32.9 pg (ref 26.0–34.0)
MCHC: 34.7 g/dL (ref 30.0–36.0)
MCV: 94.8 fL (ref 80.0–100.0)
Platelets: 227 10*3/uL (ref 150–400)
RBC: 3.83 MIL/uL — ABNORMAL LOW (ref 4.22–5.81)
RDW: 12.8 % (ref 11.5–15.5)
WBC: 9.9 10*3/uL (ref 4.0–10.5)
nRBC: 0 % (ref 0.0–0.2)

## 2023-05-22 LAB — GLUCOSE, CAPILLARY
Glucose-Capillary: 115 mg/dL — ABNORMAL HIGH (ref 70–99)
Glucose-Capillary: 119 mg/dL — ABNORMAL HIGH (ref 70–99)
Glucose-Capillary: 162 mg/dL — ABNORMAL HIGH (ref 70–99)
Glucose-Capillary: 276 mg/dL — ABNORMAL HIGH (ref 70–99)
Glucose-Capillary: 133 mg/dL — ABNORMAL HIGH (ref 70–99)

## 2023-05-22 LAB — MAGNESIUM: Magnesium: 1.8 mg/dL (ref 1.7–2.4)

## 2023-05-22 MED ORDER — MAGNESIUM SULFATE 2 GM/50ML IV SOLN
2.0000 g | Freq: Once | INTRAVENOUS | Status: AC
Start: 2023-05-22 — End: 2023-05-22
  Administered 2023-05-22: 2 g via INTRAVENOUS
  Filled 2023-05-22: qty 50

## 2023-05-22 MED ORDER — POTASSIUM CHLORIDE CRYS ER 20 MEQ PO TBCR
20.0000 meq | EXTENDED_RELEASE_TABLET | Freq: Once | ORAL | Status: AC
  Administered 2023-05-22: 20 meq via ORAL
  Filled 2023-05-22: qty 1

## 2023-05-22 MED ORDER — POTASSIUM CHLORIDE CRYS ER 20 MEQ PO TBCR
40.0000 meq | EXTENDED_RELEASE_TABLET | Freq: Once | ORAL | Status: AC
  Administered 2023-05-22: 40 meq via ORAL
  Filled 2023-05-22: qty 2

## 2023-05-22 MED ORDER — CALCIUM GLUCONATE-NACL 2-0.675 GM/100ML-% IV SOLN
2.0000 g | Freq: Once | INTRAVENOUS | Status: AC
  Administered 2023-05-22: 2000 mg via INTRAVENOUS
  Filled 2023-05-22: qty 100

## 2023-05-22 NOTE — Progress Notes (Addendum)
 Speech Language Pathology Treatment: Dysphagia  Patient Details Name: Andre Walker MRN: [id number] DOB: 04/29/1932 Today's Date: 05/22/2023 Time: 0102-7253 SLP Time Calculation (min) (ACUTE ONLY): 18 min  Assessment / Plan / Recommendation Clinical Impression  SLP reviewed MBS results and recommendations with pt and wife at bedside. Wife brought partial (only has lower), SLP cleaned and pt donned with some difficulty. Pt drooling slightly and noted to cough with possible increased saliva and airway intrusion prior to po's versus baseline cough. Observed with simulated Dys texture (graham cracker in applesauce) which he masticated well then trialed with solid graham cracker with partial now in place. He masticated graham cracker without significant delays or fatigue and no residue. Cup and straw sips nectar thick consumed with minimal cues for small sips (and wife stated he was eating fast yesterday) without overt s/s aspiration. Vocal quality overall clear with one questionable wet vocal quality. Pt cued to clear throat intermittently during meals. SLP upgraded pt to Dys 3, continue nectar, recommend meds whole in applesauce versus crushed. Per OT note, recommendation is for rehab < 3 hours a day. ST will continue work with pt.    HPI HPI: Patient is an 89 y.o. male with PMH: DM, a-fib, hiatal hernia, hearing loss (bilateral hearing aids). He presented to the hospital on 05/19/23 with confusion followed by sudden onset of seizure activity. He was transported to the hospital by EMS. In ER he was not protecting his airway and was intubated. He was extubated on 04/22/23 and kept NPO awaiting SLP swallow evaluation.      SLP Plan  Continue with current plan of care      Recommendations for follow up therapy are one component of a multi-disciplinary discharge planning process, led by the attending physician.  Recommendations may be updated based on patient status, additional functional criteria and  insurance authorization.    Recommendations  Diet recommendations: Dysphagia 3 (mechanical soft);Nectar-thick liquid Liquids provided via: Cup;Straw Medication Administration: Whole meds with puree Supervision: Patient able to self feed;Full supervision/cueing for compensatory strategies Compensations: Slow rate;Small sips/bites;Clear throat intermittently Postural Changes and/or Swallow Maneuvers: Seated upright 90 degrees                  Oral care BID   Frequent or constant Supervision/Assistance Dysphagia, oropharyngeal phase (R13.12)     Continue with current plan of care     [name]  05/22/2023, 11:39 AM

## 2023-05-22 NOTE — Evaluation (Signed)
 Occupational Therapy Evaluation Patient Details Name: Andre Walker MRN: [id number] DOB: 1932/07/27 Today's Date: 05/22/2023   History of Present Illness   89 y.o. male admitted 3/25 with Sz at home (ILF) and repeat sz enroute. Intubated 3/25-3/26. PMhx: AFib on eliquis, DM, HLD, HOH, diverticulosis, BPH     Clinical Impressions Patient admitted for the diagnosis above.  PTA he lives at a local ILF with his spouse, who can provide supportive assist.  PTA patient reports Independence with ADL and use of a 4wrw in his community.  Patient was receiving HH PT PTA.  Currently he presents with decreased cognition, weakness, poor balance and safety concerns.  Patient is needing heavy Mod A for ADL completion and for simple transfers this session.  OT will continue efforts in the acute setting, and Patient will benefit from continued inpatient follow up therapy, <3 hours/day.  Spouse unable to provide the needed assist to transition directly home.       If plan is discharge home, recommend the following:   Assist for transportation;A lot of help with bathing/dressing/bathroom;A lot of help with walking and/or transfers;Supervision due to cognitive status     Functional Status Assessment   Patient has had a recent decline in their functional status and demonstrates the ability to make significant improvements in function in a reasonable and predictable amount of time.     Equipment Recommendations   BSC/3in1     Recommendations for Other Services         Precautions/Restrictions   Precautions Precautions: Fall Recall of Precautions/Restrictions: Impaired Restrictions Weight Bearing Restrictions Per Provider Order: No     Mobility Bed Mobility Overal bed mobility: Needs Assistance Bed Mobility: Supine to Sit     Supine to sit: Mod assist          Transfers Overall transfer level: Needs assistance   Transfers: Sit to/from Stand, Bed to chair/wheelchair/BSC Sit to  Stand: Min assist, Mod assist     Step pivot transfers: Min assist, Mod assist     General transfer comment: fatigues quickly      Balance Overall balance assessment: Needs assistance Sitting-balance support: Feet supported Sitting balance-Leahy Scale: Fair     Standing balance support: Reliant on assistive device for balance, Bilateral upper extremity supported Standing balance-Leahy Scale: Poor                             ADL either performed or assessed with clinical judgement   ADL Overall ADL's : Needs assistance/impaired Eating/Feeding: Minimal assistance;Sitting   Grooming: Wash/dry hands;Wash/dry face;Contact guard assist;Sitting   Upper Body Bathing: Moderate assistance;Sitting   Lower Body Bathing: Maximal assistance;Sit to/from stand   Upper Body Dressing : Moderate assistance;Sitting   Lower Body Dressing: Maximal assistance;Sit to/from stand   Toilet Transfer: Moderate assistance;Stand-pivot;BSC/3in1                   Vision   Vision Assessment?: No apparent visual deficits     Perception Perception: Not tested       Praxis Praxis: Not tested       Pertinent Vitals/Pain Pain Assessment Pain Assessment: Faces Faces Pain Scale: No hurt Pain Intervention(s): Monitored during session     Extremity/Trunk Assessment Upper Extremity Assessment Upper Extremity Assessment: Generalized weakness;Right hand dominant;RUE deficits/detail;LUE deficits/detail RUE Deficits / Details: decreased end range shoulder flexion RUE Sensation: WNL RUE Coordination: WNL LUE Deficits / Details: decreased end range shoulder flexion LUE Sensation:  WNL LUE Coordination: WNL   Lower Extremity Assessment Lower Extremity Assessment: Defer to PT evaluation   Cervical / Trunk Assessment Cervical / Trunk Assessment: Kyphotic   Communication Communication Communication: Impaired Factors Affecting Communication: Hearing impaired   Cognition Arousal:  Lethargic Behavior During Therapy: Flat affect Cognition: Cognition impaired   Orientation impairments: Situation     Attention impairment (select first level of impairment): Sustained attention Executive functioning impairment (select all impairments): Reasoning OT - Cognition Comments: Patient continued to try and pull at lines dispite multiple cues and redirection                 Following commands: Impaired Following commands impaired: Follows one step commands with increased time     Cueing  General Comments   Cueing Techniques: Verbal cues;Gestural cues   VSS on RA   Exercises     Shoulder Instructions      Home Living Family/patient expects to be discharged to:: Private residence Living Arrangements: Spouse/significant other Available Help at Discharge: Family;Available 24 hours/day Type of Home: Apartment Home Access: Elevator     Home Layout: One level     Bathroom Shower/Tub: Producer, television/film/video: Handicapped height Bathroom Accessibility: Yes   Home Equipment: Agricultural consultant (2 wheels);Cane - single point          Prior Functioning/Environment Prior Level of Function : Driving;Independent/Modified Independent             Mobility Comments: pt was walking with RW since last admission, using electric cart while shopping. Still driving wants to return to golf ADLs Comments: completes ADLs independently    OT Problem List: Decreased strength;Decreased activity tolerance;Impaired balance (sitting and/or standing);Decreased safety awareness   OT Treatment/Interventions: Self-care/ADL training;Therapeutic exercise;Therapeutic activities;Cognitive remediation/compensation;Patient/family education;DME and/or AE instruction;Balance training      OT Goals(Current goals can be found in the care plan section)   Acute Rehab OT Goals Patient Stated Goal: Return home OT Goal Formulation: With patient Time For Goal Achievement:  06/05/23 Potential to Achieve Goals: Good ADL Goals Pt Will Perform Grooming: with supervision;standing Pt Will Perform Upper Body Dressing: with supervision;sitting Pt Will Perform Lower Body Dressing: with min assist;sit to/from stand Pt Will Transfer to Toilet: with contact guard assist;stand pivot transfer;bedside commode Pt/caregiver will Perform Home Exercise Program: Increased strength;Both right and left upper extremity;With theraband;With minimal assist   OT Frequency:  Min 1X/week    Co-evaluation              AM-PAC OT "6 Clicks" Daily Activity     Outcome Measure Help from another person eating meals?: A Little Help from another person taking care of personal grooming?: A Little Help from another person toileting, which includes using toliet, bedpan, or urinal?: A Lot Help from another person bathing (including washing, rinsing, drying)?: A Lot Help from another person to put on and taking off regular upper body clothing?: A Lot Help from another person to put on and taking off regular lower body clothing?: A Lot 6 Click Score: 14   End of Session Nurse Communication: Mobility status  Activity Tolerance: Patient limited by fatigue Patient left: in chair;with call bell/phone within reach;with chair alarm set;with family/visitor present  OT Visit Diagnosis: Unsteadiness on feet (R26.81);Muscle weakness (generalized) (M62.81);Other symptoms and signs involving cognitive function                Time: 1610-9604 OT Time Calculation (min): 22 min Charges:  OT General Charges $OT Visit: 1 Visit  OT Evaluation $OT Eval Moderate Complexity: 1 Mod  05/22/2023  RP, OTR/L  Acute Rehabilitation Services  Office:  [phone]   Suzanna Obey 05/22/2023, 11:24 AM

## 2023-05-22 NOTE — Progress Notes (Signed)
 NAME:  Andre Walker, MRN:  [id number], DOB:  28-Jul-1932, LOS: 3 ADMISSION DATE:  05/19/2023, CONSULTATION DATE:  3/25  REFERRING MD:  EDP, CHIEF COMPLAINT:  seizure, intubated    History of Present Illness:  89yo male resident of SNF with hx AFib on eliquis, DM presents 3/25 with seizure. Altered this morning, somewhat confused then had sudden onset seizure activity. Had recurrent seizure for EMS during transport with L gaze. On arrival to ER was not protecting his airway and was intubated.  Per wife had admission in February after episode of ?syncope/unresponsiveness where seizure was discussed but EEG did not show seizure.  Other than that has been in his usual state of health. Had outpt appt yesterday for f/u hypoMg - outpt Mg was 1.3 3/24 Pertinent  Medical History   has a past medical history of Actinic keratosis, Atrial fibrillation (HCC), BPH (benign prostatic hyperplasia), Colon polyps, Diabetes mellitus (2004), Diverticulosis, ED (erectile dysfunction), Gallstones, Hearing loss, Hiatal hernia, LAE (left atrial enlargement) (01/2019), Melanoma (HCC), Microalbuminuria, Pneumonia (01/2019), Pure hypercholesterolemia (2006), Traumatic partial tear of biceps tendon (01/2009), Wears glasses, Wears hearing aid in both ears, Wears partial dentures, and Wears partial dentures.   Significant Hospital Events: Including procedures, antibiotic start and stop dates in addition to other pertinent events   CT head 3/25>> neg acute  CTA head/neck 3/25>>>Negative for large vessel occlusion.  2. Generalized intracranial artery tortuosity but no significant intracranial atherosclerosis. But positive for Bulky Extracranial Atherosclerosis with multifocal High-grade stenoses: - Severe stenosis Right ICA origin approaching a RADIOGRAPHIC STRING SIGN. - superimposed 65% stenosis mid Right CCA. - 70% stenosis proximal Left Subclavian Artery. - Moderate to Severe stenosis at Both Vertebral Artery  origins. 3/26 MRI ok no seizure EEG extubated  Interim History / Subjective:  Doing better, needs rehab I think  Objective   Blood pressure (!) 143/83, pulse 69, temperature 99 F (37.2 C), resp. rate (!) 22, height 5\' 10"  (1.778 m), weight 87 kg, SpO2 96%.        Intake/Output Summary (Last 24 hours) at 05/22/2023 0917 Last data filed at 05/22/2023 0800 Gross per 24 hour  Intake 1674.14 ml  Output 1640 ml  Net 34.14 ml   Filed Weights   05/20/23 0143 05/21/23 0442 05/22/23 0411  Weight: 82.9 kg 86 kg 87 kg    Examination: General: chronically ill appearing elderly male, lying in bed HENT: AT/[place] Lungs: diminished coarse Cardiovascular: s1s2 rrr Abdomen: round, soft Extremities: warm and dry Neuro: Alert, no deficits noted  Resolved Hospital Problem list     Assessment & Plan:  Seizure  AMS  P:  Neuro following  EEG showed no further seizures Keppra  MRI ok  Respiratory failure - intubated for airway protection in setting seizure/AMS P:  Extubated 3/26 Wean O2 as able, goal sat 90%, on room air  AFib on eliquis  P:  Eliquis Rate controlled without meds  Hypomagnesemia P:  Replace  DM  P:  Hold metformin  SSI   Bradycardia -unclear if develop before or after dexmedetomidine but likely dexmedetomidine making things worse P:  Stop dexemedetomidine and this imrpoved  Hypotension: With increasing sedation likely hypovolemic as well as delayed effect from sedation for mechanical ventilation. P: Improved after fluids 3/26, now off pressors  Fever: Presumed related to CAP, now defervesced P: CTX x 5 days, Azithro added 3/27 with ongoing fever plan 3 days  Best Practice (right click and "Reselect all SmartList Selections" daily)   Diet/type: dysphagia diet (  see orders) DVT prophylaxis DOAC Pressure ulcer(s): N/A GI prophylaxis: N/A Lines: N/A Foley:  N/A Code Status:  full code Last date of multidisciplinary goals of care discussion [3/25]  Discussed at length with wife at bedside in ER. Full code but wife confirms pt has living will and would not want prolonged support.   Labs   CBC: Recent Labs  Lab 05/19/23 0913 05/19/23 0922 05/19/23 1005 05/19/23 1213 05/20/23 1436 05/21/23 0320 05/22/23 0349  WBC 11.4*  --   --   --  14.9* 14.8* 9.9  NEUTROABS 7.3  --   --   --   --   --   --   HGB 14.1   < > 12.9* 12.9* 14.3 13.6 12.6*  HCT 44.7   < > 38.0* 38.0* 43.2 41.1 36.3*  MCV 102.3*  --   --   --  97.3 96.7 94.8  PLT 286  --   --   --  249 170 227   < > = values in this interval not displayed.    Basic Metabolic Panel: Recent Labs  Lab 05/18/23 1408 05/18/23 1408 05/19/23 0913 05/19/23 0922 05/19/23 1005 05/19/23 1210 05/19/23 1213 05/19/23 1552 05/20/23 1436 05/21/23 0426 05/22/23 0349  NA 141   < > 137 138   < >  --  137 136 138 137 138  K 4.6  --  3.9 3.9   < >  --  4.3 4.3 3.9 4.2 3.6  CL 103  --  101 103  --   --   --  106 103 107 108  CO2 22  --  15*  --   --   --   --  22 22 21* 23  GLUCOSE 143*   < > 226* 217*  --   --   --  113* 121* 113* 130*  BUN 26   < > 22 23  --   --   --  21 25* 21 24*  CREATININE 0.94  --  1.29* 1.10  --   --   --  1.00 1.17 1.20 0.96  CALCIUM 9.8  --  9.2  --   --   --   --  8.8* 8.9 8.4* 8.4*  MG 1.3*  --   --   --   --  1.4*  --   --  1.6* 1.6*  --   PHOS  --   --   --   --   --   --   --   --  3.3 3.3  --    < > = values in this interval not displayed.   GFR: Estimated Creatinine Clearance: 53.9 mL/min (by C-G formula based on SCr of 0.96 mg/dL). Recent Labs  Lab 05/19/23 0913 05/19/23 1210 05/19/23 1424 05/20/23 1436 05/20/23 1952 05/21/23 0320 05/22/23 0349  WBC 11.4*  --   --  14.9*  --  14.8* 9.9  LATICACIDVEN  --  3.6* 3.2*  --  3.0*  --   --     Liver Function Tests: Recent Labs  Lab 05/19/23 0913 05/21/23 0426  AST 26  --   ALT 21  --   ALKPHOS 84  --   BILITOT 1.0  --   PROT 7.1  --   ALBUMIN 4.0 3.1*   No results for input(s):  "LIPASE", "AMYLASE" in the last 168 hours. No results for input(s): "AMMONIA" in the last 168 hours.  ABG    Component Value  Date/Time   PHART 7.328 (L) 05/19/2023 1213   PCO2ART 43.5 05/19/2023 1213   PO2ART 164 (H) 05/19/2023 1213   HCO3 23.0 05/19/2023 1213   TCO2 24 05/19/2023 1213   ACIDBASEDEF 3.0 (H) 05/19/2023 1213   O2SAT 99 05/19/2023 1213     Coagulation Profile: Recent Labs  Lab 05/19/23 0913  INR 1.2    Cardiac Enzymes: No results for input(s): "CKTOTAL", "CKMB", "CKMBINDEX", "TROPONINI" in the last 168 hours.  HbA1C: Hemoglobin A1C  Date/Time Value Ref Range Status  03/12/2023 01:57 PM 6.8 (A) 4.0 - 5.6 % Final  08/06/2022 02:31 PM 6.4 (A) 4.0 - 5.6 % Final   Hgb A1c MFr Bld  Date/Time Value Ref Range Status  04/23/2023 04:45 AM 6.6 (H) 4.8 - 5.6 % Final    Comment:    (NOTE) Pre diabetes:          5.7%-6.4%  Diabetes:              >6.4%  Glycemic control for   <7.0% adults with diabetes   01/29/2022 08:34 AM 6.6 (H) 4.8 - 5.6 % Final    Comment:             Prediabetes: 5.7 - 6.4          Diabetes: >6.4          Glycemic control for adults with diabetes: <7.0     CBG: Recent Labs  Lab 05/21/23 1503 05/21/23 1931 05/21/23 2328 05/22/23 0320 05/22/23 0754  GLUCAP 158* 174* 186* 115* 119*    Review of Systems:   Unable - pt sedated on vent. Hx obtained from records, RN, and wife at bedside.   Past Medical History:  He,  has a past medical history of Actinic keratosis, Atrial fibrillation (HCC), BPH (benign prostatic hyperplasia), Colon polyps, Diabetes mellitus (2004), Diverticulosis, ED (erectile dysfunction), Gallstones, Hearing loss, Hiatal hernia, LAE (left atrial enlargement) (01/2019), Melanoma (HCC), Microalbuminuria, Pneumonia (01/2019), Pure hypercholesterolemia (2006), Traumatic partial tear of biceps tendon (01/2009), Wears glasses, Wears hearing aid in both ears, Wears partial dentures, and Wears partial dentures.   Surgical  History:   Past Surgical History:  Procedure Laterality Date   APPENDECTOMY  1955   BILIARY STENT PLACEMENT N/A 01/28/2019   Procedure: BILIARY STENT PLACEMENT;  Surgeon: Willis Modena, MD;  Location: [facility];  Service: Endoscopy;  Laterality: N/A;   CATARACT EXTRACTION Bilateral R 05/07/15, L 05/21/15   Dr. Dione Booze   CHOLECYSTECTOMY N/A 01/27/2019   Procedure: LAPAROSCOPIC CHOLECYSTECTOMY WITH INTRAOPEREATIVE CHOLANGIOGRAM;  Surgeon: Ovidio Kin, MD;  Location: [facility];  Service: General;  Laterality: N/A;   COLONOSCOPY  11/09, 10/29/10   Dr. Ewing Schlein   ERCP N/A 01/28/2019   Procedure: ENDOSCOPIC RETROGRADE CHOLANGIOPANCREATOGRAPHY (ERCP);  Surgeon: Willis Modena, MD;  Location: [facility];  Service: Endoscopy;  Laterality: N/A;   ERCP N/A 04/12/2019   Procedure: ENDOSCOPIC RETROGRADE CHOLANGIOPANCREATOGRAPHY (ERCP);  Surgeon: Vida Rigger, MD;  Location: [facility];  Service: Endoscopy;  Laterality: N/A;  stent removal   ESOPHAGOGASTRODUODENOSCOPY  10/29/10   Dr. Ewing Schlein   INGUINAL HERNIA REPAIR Right 1989   MELANOMA EXCISION  10/08/10   R upper arm, Dr. Daphine Deutscher   MELANOMA EXCISION  03/16/2012   Procedure: MELANOMA EXCISION;  Surgeon: Valarie Merino, MD;  Location: [facility];  Service: General;  Laterality: Right;  excision of melanoma of right arm   REMOVAL OF STONES  01/28/2019   Procedure: REMOVAL OF STONES;  Surgeon: Willis Modena, MD;  Location:  [facility];  Service: Endoscopy;;   SPHINCTEROTOMY  01/28/2019   Procedure: SPHINCTEROTOMY;  Surgeon: Willis Modena, MD;  Location: [facility];  Service: Endoscopy;;   SPHINCTEROTOMY  04/12/2019   Procedure: Dennison Mascot;  Surgeon: Vida Rigger, MD;  Location: [facility];  Service: Endoscopy;;  stone sludge   STENT REMOVAL  04/12/2019   Procedure: STENT REMOVAL;  Surgeon: Vida Rigger, MD;  Location: [facility];  Service: Endoscopy;;   TOTAL KNEE ARTHROPLASTY Right 2012   TOTAL KNEE ARTHROPLASTY Left 2005      Social History:   reports that he has never smoked. He has never used smokeless tobacco. He reports current alcohol use. He reports that he does not use drugs.   Family History:  His family history includes Cancer in his paternal uncle; Diabetes in his brother, sister, and sister; Heart disease in his brother and paternal uncle; Hyperlipidemia in his brother; Hypertension in his brother; Stroke in his brother.   Allergies Allergies  Allergen Reactions   Penicillins Rash     Home Medications  Prior to Admission medications   Medication Sig Start Date End Date Taking? Authorizing Provider  acetaminophen (TYLENOL) 325 MG tablet Take 325-650 mg by mouth every 6 (six) hours as needed for mild pain (pain score 1-3) or moderate pain (pain score 4-6). Patient not taking: Reported on 05/18/2023    [provider]  apixaban Everlene Balls) 5 MG TABS tablet Take 1 tablet by mouth twice daily 04/07/23   [place], Peter M, MD  Ascorbic Acid (VITAMIN C PO) Take 2 tablets by mouth daily.    [provider]  Calcium Carbonate-Vitamin D 600-400 MG-UNIT tablet Take 1 tablet by mouth 2 (two) times daily.    [provider]  Coenzyme Q10 (COQ10) 100 MG CAPS Take 100 mg by mouth daily at 12 noon.    [provider]  fluorouracil (EFUDEX) 5 % cream Apply 1 application  topically as needed (wounds). Patient not taking: Reported on 04/30/2023 05/08/20   [provider]  Glucosamine-Chondroitin (COSAMIN DS PO) Take 1 tablet by mouth 2 (two) times daily. With lunch & supper    [provider]  glucose blood (ONETOUCH VERIO) test strip USE 1 STRIP TO CHECK GLUCOSE ONCE DAILY DX:E11.69 05/17/20   Joselyn Arrow, MD  Lancets [facility] DELICA PLUS [place]) MISC Patient to check blood sugar once daily(usually in the morning). 06/11/22   Joselyn Arrow, MD  loratadine (CLARITIN) 10 MG tablet Take 10 mg by mouth daily.    [provider]  magnesium oxide (MAG-OX) 400 (240 Mg)  MG tablet Take 400 mg by mouth daily.    [provider]  metFORMIN (GLUCOPHAGE-XR) 500 MG 24 hr tablet TAKE 3 TABLETS BY MOUTH ONCE DAILY WITH BREAKFAST Patient taking differently: Take by mouth. Take 1000 mg by mouth in the morning and 500 mg at bedtime. 03/12/23   Joselyn Arrow, MD  Multiple Vitamins-Minerals (MULTIVITAMIN WITH MINERALS) tablet Take 1 tablet by mouth daily.    [provider]  Omega-3 Fatty Acids (FISH OIL PO) Take 1 capsule by mouth daily with supper.    [provider]  simvastatin (ZOCOR) 20 MG tablet Take 1 tablet (20 mg total) by mouth at bedtime. 03/12/23   Joselyn Arrow, MD     Critical care time: n/a    Karren Burly, MD Pulmonary/Critical Care Medicine  05/22/2023  9:17 AM   See [facility] on call pager [phone] until 7pm. Please call Elink 7p-7a. [phone]

## 2023-05-22 NOTE — Care Management (Signed)
 Transition of Care [facility]) - Inpatient Brief Assessment   Patient Details  Name: Andre Walker MRN: [id number] Date of Birth: 04/02/1932  [facility]) CM/SW Contact:    Lockie Pares, RN Phone Number: 05/22/2023, 3:06 PM   Clinical Narrative:  [facility] left a message inquiring when he is coming back. She is aware that he has been recommended for SNF before he [place] go back to [place].   Will need Fl2 and discussion with family /patient when closer to [place] Transition of Care Asessment: Insurance and Status: Insurance coverage has been reviewed Patient has primary care physician: Yes Home environment has been reviewed: From   Prior/Current Home Services: No current home services Social Drivers of Health Review: SDOH reviewed no interventions necessary Readmission risk has been reviewed: Yes Transition of care needs: transition of care needs identified, TOC will continue to follow

## 2023-05-22 NOTE — Progress Notes (Signed)
 Report called and vitals w/in order parameters and CCM notified of trending up SBP and per CCM will add PRNs to help cover pt

## 2023-05-23 ENCOUNTER — Inpatient Hospital Stay (HOSPITAL_COMMUNITY)

## 2023-05-23 DIAGNOSIS — R569 Unspecified convulsions: Secondary | ICD-10-CM | POA: Diagnosis not present

## 2023-05-23 LAB — GLUCOSE, CAPILLARY
Glucose-Capillary: 126 mg/dL — ABNORMAL HIGH (ref 70–99)
Glucose-Capillary: 149 mg/dL — ABNORMAL HIGH (ref 70–99)
Glucose-Capillary: 151 mg/dL — ABNORMAL HIGH (ref 70–99)
Glucose-Capillary: 154 mg/dL — ABNORMAL HIGH (ref 70–99)
Glucose-Capillary: 190 mg/dL — ABNORMAL HIGH (ref 70–99)
Glucose-Capillary: 191 mg/dL — ABNORMAL HIGH (ref 70–99)
Glucose-Capillary: 51 mg/dL — ABNORMAL LOW (ref 70–99)

## 2023-05-23 LAB — CBC
HCT: 37.6 % — ABNORMAL LOW (ref 39.0–52.0)
HCT: 34.6 % — ABNORMAL LOW (ref 39.0–52.0)
Hemoglobin: 13 g/dL (ref 13.0–17.0)
Hemoglobin: 11.9 g/dL — ABNORMAL LOW (ref 13.0–17.0)
MCH: 32.3 pg (ref 26.0–34.0)
MCH: 32.6 pg (ref 26.0–34.0)
MCHC: 34.6 g/dL (ref 30.0–36.0)
MCHC: 34.4 g/dL (ref 30.0–36.0)
MCV: 93.5 fL (ref 80.0–100.0)
MCV: 94.8 fL (ref 80.0–100.0)
Platelets: 286 10*3/uL (ref 150–400)
Platelets: 260 10*3/uL (ref 150–400)
RBC: 4.02 MIL/uL — ABNORMAL LOW (ref 4.22–5.81)
RBC: 3.65 MIL/uL — ABNORMAL LOW (ref 4.22–5.81)
RDW: 12.5 % (ref 11.5–15.5)
RDW: 12.7 % (ref 11.5–15.5)
WBC: 11 10*3/uL — ABNORMAL HIGH (ref 4.0–10.5)
WBC: 13.7 10*3/uL — ABNORMAL HIGH (ref 4.0–10.5)
nRBC: 0 % (ref 0.0–0.2)
nRBC: 0 % (ref 0.0–0.2)

## 2023-05-23 LAB — PHOSPHORUS: Phosphorus: 2.4 mg/dL — ABNORMAL LOW (ref 2.5–4.6)

## 2023-05-23 LAB — MAGNESIUM: Magnesium: 1.5 mg/dL — ABNORMAL LOW (ref 1.7–2.4)

## 2023-05-23 LAB — BASIC METABOLIC PANEL WITH GFR
Anion gap: 7 (ref 5–15)
BUN: 20 mg/dL (ref 8–23)
CO2: 26 mmol/L (ref 22–32)
Calcium: 9.3 mg/dL (ref 8.9–10.3)
Chloride: 106 mmol/L (ref 98–111)
Creatinine, Ser: 1.1 mg/dL (ref 0.61–1.24)
GFR, Estimated: >60 mL/min (ref >60)
Glucose, Bld: 119 mg/dL — ABNORMAL HIGH (ref 70–99)
Potassium: 3.6 mmol/L (ref 3.5–5.1)
Sodium: 139 mmol/L (ref 135–145)

## 2023-05-23 LAB — TSH: TSH: 8.026 u[IU]/mL — ABNORMAL HIGH (ref 0.350–4.500)

## 2023-05-23 LAB — BRAIN NATRIURETIC PEPTIDE: B Natriuretic Peptide: 533 pg/mL — ABNORMAL HIGH (ref 0.0–100.0)

## 2023-05-23 LAB — T4, FREE: Free T4: 1.05 ng/dL (ref 0.61–1.12)

## 2023-05-23 MED ORDER — POTASSIUM CHLORIDE CRYS ER 20 MEQ PO TBCR
40.0000 meq | EXTENDED_RELEASE_TABLET | Freq: Once | ORAL | Status: AC
  Administered 2023-05-23: 40 meq via ORAL
  Filled 2023-05-23: qty 2

## 2023-05-23 MED ORDER — MAGNESIUM SULFATE 4 GM/100ML IV SOLN
4.0000 g | Freq: Once | INTRAVENOUS | Status: AC
  Administered 2023-05-23: 4 g via INTRAVENOUS
  Filled 2023-05-23: qty 100

## 2023-05-23 MED ORDER — PANTOPRAZOLE SODIUM 40 MG PO TBEC
40.0000 mg | DELAYED_RELEASE_TABLET | Freq: Every day | ORAL | Status: DC
  Administered 2023-05-23 – 2023-05-27 (×4): 40 mg via ORAL
  Filled 2023-05-23: qty 1
  Filled 2023-05-23 (×2): qty 2
  Filled 2023-05-23 (×2): qty 1

## 2023-05-23 MED ORDER — LEVALBUTEROL HCL 0.63 MG/3ML IN NEBU: 0.6300 mg | INHALATION_SOLUTION | Freq: Four times a day (QID) | RESPIRATORY_TRACT | Status: DC | PRN

## 2023-05-23 MED ORDER — DEXTROSE 50 % IV SOLN
25.0000 g | INTRAVENOUS | Status: AC
  Administered 2023-05-23: 25 g via INTRAVENOUS
  Filled 2023-05-23: qty 50

## 2023-05-23 MED ORDER — TAMSULOSIN HCL 0.4 MG PO CAPS
0.4000 mg | ORAL_CAPSULE | Freq: Every day | ORAL | Status: DC
  Administered 2023-05-23 – 2023-05-27 (×4): 0.4 mg via ORAL
  Filled 2023-05-23 (×4): qty 1

## 2023-05-23 MED ORDER — METOPROLOL TARTRATE 25 MG PO TABS
25.0000 mg | ORAL_TABLET | Freq: Two times a day (BID) | ORAL | Status: DC
  Administered 2023-05-23 (×2): 25 mg via ORAL
  Filled 2023-05-23 (×2): qty 1

## 2023-05-23 MED ORDER — GUAIFENESIN ER 600 MG PO TB12: 600.0000 mg | ORAL_TABLET | Freq: Two times a day (BID) | ORAL | Status: DC

## 2023-05-23 NOTE — Progress Notes (Signed)
   05/23/23 2251  Assess: MEWS Score  Temp (!) (S)  100.8 F (38.2 C)  BP (S)  120/61  MAP (mmHg) (S)  76  Pulse Rate (S)  64  ECG Heart Rate (S)  63  Resp (!) (S)  31  Level of Consciousness (S)  Alert  SpO2 (S)  94 %  O2 Device (S)  Nasal Cannula  O2 Flow Rate (L/min) (S)  4 L/min   Yellow MEWS. Contacted on call Dr. Janalyn Shy who ordered labs, CXR and [facility] and Xopenex Neb treatment. Pt has new O2 requirement of 4LNC. Congested, pt unable to expectorate audible phlegm. Crackles to R side.

## 2023-05-23 NOTE — Plan of Care (Signed)

## 2023-05-23 NOTE — Progress Notes (Signed)
 PROGRESS NOTE                                                                                                                                                                                                             Patient Demographics:    Andre Walker, is a 89 y.o. male, DOB - [birth year], XLK:[id number]  Outpatient Primary MD for the patient is Joselyn Arrow, MD    LOS - 4  Admit date - 05/19/2023    Chief Complaint  Patient presents with   Altered Mental Status       Brief Narrative (HPI from H&P)   89yo male resident of SNF with hx AFib on eliquis, DM presents 3/25 with seizure. Altered this morning, somewhat confused then had sudden onset seizure activity. Had recurrent seizure for EMS during transport with L gaze. On arrival to ER was not protecting his airway and was intubated.  Per wife had admission in February after episode of ?syncope/unresponsiveness where seizure, he was admitted to the hospital under ICU care, he was intubated for airway protection, was seen by neurology and transferred to my service on 05/23/2023 on day 4 of his hospital stay.     Significant Hospital Events: Including procedures, antibiotic start and stop dates in addition to other pertinent events   CT head 3/25>> neg acute  CTA head/neck 3/25>>>Negative for large vessel occlusion.  2. Generalized intracranial artery tortuosity but no significant intracranial atherosclerosis. But positive for Bulky Extracranial Atherosclerosis with multifocal High-grade stenoses: - Severe stenosis Right ICA origin approaching a RADIOGRAPHIC STRING SIGN. - superimposed 65% stenosis mid Right CCA. - 70% stenosis proximal Left Subclavian Artery. - Moderate to Severe stenosis at Both Vertebral Artery origins. 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   Assessment  & Plan :   Seizure, metabolic encephalopathy.  Seen by neurology, stable MRI, EEG nonacute, placed on Keppra, per neurology continue Keppra.  No further episodes of seizure.  Mentation improving, some hospital-acquired delirium.   Respiratory failure/failure to protect airway due to #1 above- intubated for airway protection, extubated in about 24 hours, complicated by pneumonia aspiration versus ventilator associated.  Currently on combination of Rocephin and azithromycin, continue I-S and flutter valve, speech therapy following.  Monitor cultures.    Paroxysmal AFib on eliquis - continue the patient on Eliquis, add low-dose beta-blocker for proper rate control.   Hypomagnesemia :  Replace   Bradycardia - likely from dexmedetomidine but likely dexmedetomidine making things worse, dexemedetomidine and this improved, not tachycardic.  TSH and free T4 as well.   DM 2 -  Hold metformin, SSI   Lab Results  Component Value Date   HGBA1C 6.6 (H) 04/23/2023   CBG (last 3)  Recent Labs    05/23/23 0058 05/23/23 0348 05/23/23 0418  GLUCAP 154* 51* 126*         Condition - Extremely Guarded  Family Communication  :  None present  Code Status :  Full  Consults  :   Neuro, PCCM  PUD Prophylaxis :  PPI   Procedures  :            Disposition Plan  :    Status is: Inpatient   DVT Prophylaxis  :    SCDs Start: 05/19/23 1041 apixaban (ELIQUIS) tablet 5 mg     Lab Results  Component Value Date   PLT 286 05/23/2023    Diet :  Diet Order             DIET DYS 3 Room service appropriate? Yes with Assist; Fluid consistency: Nectar Thick  Diet effective now                    Inpatient Medications  Scheduled Meds:  apixaban  5 mg Oral BID   Chlorhexidine Gluconate Cloth  6 each Topical Daily   guaiFENesin  15 mL Oral Q6H   insulin aspart  0-15 Units Subcutaneous  Q4H   levETIRAcetam  500 mg Oral BID   mouth rinse  15 mL Mouth Rinse 4 times per day   simvastatin  20 mg Oral q1800   sodium chloride HYPERTONIC  4 mL Nebulization TID   Continuous Infusions:  azithromycin Stopped (05/22/23 1039)   cefTRIAXone (ROCEPHIN)  IV 1 g (05/22/23 2022)   magnesium sulfate bolus IVPB 4 g (05/23/23 0701)   PRN Meds:.acetaminophen, docusate sodium, ipratropium-albuterol, mouth rinse, polyethylene glycol  Antibiotics  :    Anti-infectives (From admission, onward)    Start     Dose/Rate Route Frequency Ordered Stop   05/21/23 1000  azithromycin (ZITHROMAX) 500 mg in sodium chloride 0.9 % 250 mL IVPB        500 mg 250 mL/hr over 60 Minutes Intravenous Every 24 hours 05/21/23 0914 05/24/23 0959   05/19/23 1930  cefTRIAXone (ROCEPHIN) 1 g in sodium chloride 0.9 % 100 mL IVPB        1 g 200 mL/hr over 30 Minutes Intravenous Every 24 hours 05/19/23 1826 05/23/23 2359         Objective:   Vitals:   05/23/23 0000 05/23/23 0138 05/23/23 0347 05/23/23 0456  BP: (!) 154/91  (!) 157/93   Pulse: 98 (!) 103    Resp: (!) 30 (!) 29    Temp:  97.8 F (36.6 C) 97.6 F (36.4 C)   TempSrc:   Oral   SpO2: 93% 95%    Weight:    86.3 kg  Height:        Wt Readings from Last 3 Encounters:  05/23/23 86.3 kg  05/18/23 84.4 kg  04/30/23 86.2 kg     Intake/Output Summary (  Last 24 hours) at 05/23/2023 0753 Last data filed at 05/22/2023 1500 Gross per 24 hour  Intake 493.59 ml  Output 435 ml  Net 58.59 ml     Physical Exam  Awake Alert, No new F.N deficits, Normal affect [place].AT,PERRAL Supple Neck, No JVD,   Symmetrical Chest wall movement, Good air movement bilaterally, CTAB RRR,No Gallops,Rubs or new Murmurs,  +ve B.Sounds, Abd Soft, No tenderness,   No Cyanosis, Clubbing or edema        Data Review:    Recent Labs  Lab 05/19/23 0913 05/19/23 0922 05/19/23 1213 05/20/23 1436 05/21/23 0320 05/22/23 0349 05/23/23 0423  WBC 11.4*  --   --   14.9* 14.8* 9.9 11.0*  HGB 14.1   < > 12.9* 14.3 13.6 12.6* 13.0  HCT 44.7   < > 38.0* 43.2 41.1 36.3* 37.6*  PLT 286  --   --  249 170 227 286  MCV 102.3*  --   --  97.3 96.7 94.8 93.5  MCH 32.3  --   --  32.2 32.0 32.9 32.3  MCHC 31.5  --   --  33.1 33.1 34.7 34.6  RDW 12.2  --   --  12.4 12.5 12.8 12.5  LYMPHSABS 3.0  --   --   --   --   --   --   MONOABS 0.8  --   --   --   --   --   --   EOSABS 0.3  --   --   --   --   --   --   BASOSABS 0.0  --   --   --   --   --   --    < > = values in this interval not displayed.    Recent Labs  Lab 05/19/23 0913 05/19/23 0922 05/19/23 1210 05/19/23 1213 05/19/23 1424 05/19/23 1552 05/20/23 1436 05/20/23 1952 05/21/23 0426 05/22/23 0349 05/22/23 1004 05/23/23 0423  NA 137   < >  --    < >  --  136 138  --  137 138  --  139  K 3.9   < >  --    < >  --  4.3 3.9  --  4.2 3.6  --  3.6  CL 101   < >  --   --   --  106 103  --  107 108  --  106  CO2 15*  --   --   --   --  22 22  --  21* 23  --  26  ANIONGAP 21*  --   --   --   --  8 13  --  9 7  --  7  GLUCOSE 226*   < >  --   --   --  113* 121*  --  113* 130*  --  119*  BUN 22   < >  --   --   --  21 25*  --  21 24*  --  20  CREATININE 1.29*   < >  --   --   --  1.00 1.17  --  1.20 0.96  --  1.10  AST 26  --   --   --   --   --   --   --   --   --  22  --   ALT 21  --   --   --   --   --   --   --   --   --  17  --   ALKPHOS 84  --   --   --   --   --   --   --   --   --  72  --   BILITOT 1.0  --   --   --   --   --   --   --   --   --  1.0  --   ALBUMIN 4.0  --   --   --   --   --   --   --  3.1*  --  2.9*  --   LATICACIDVEN  --   --  3.6*  --  3.2*  --   --  3.0*  --   --   --   --   INR 1.2  --   --   --   --   --   --   --   --   --   --   --   TSH  --   --  5.072*  --   --   --   --   --   --   --   --   --   MG  --   --  1.4*  --   --   --  1.6*  --  1.6*  --  1.8 1.5*  PHOS  --   --   --   --   --   --  3.3  --  3.3  --   --   --   CALCIUM 9.2  --   --   --   --  8.8* 8.9   --  8.4* 8.4*  --  9.3   < > = values in this interval not displayed.      Recent Labs  Lab 05/19/23 0913 05/19/23 1210 05/19/23 1424 05/19/23 1552 05/20/23 1436 05/20/23 1952 05/21/23 0426 05/22/23 0349 05/22/23 1004 05/23/23 0423  LATICACIDVEN  --  3.6* 3.2*  --   --  3.0*  --   --   --   --   INR 1.2  --   --   --   --   --   --   --   --   --   TSH  --  5.072*  --   --   --   --   --   --   --   --   MG  --  1.4*  --   --  1.6*  --  1.6*  --  1.8 1.5*  CALCIUM 9.2  --   --  8.8* 8.9  --  8.4* 8.4*  --  9.3    --------------------------------------------------------------------------------------------------------------- Lab Results  Component Value Date   CHOL 101 04/23/2023   HDL 41 04/23/2023   LDLCALC 45 04/23/2023   TRIG 75 05/20/2023   CHOLHDL 2.5 04/23/2023    Lab Results  Component Value Date   HGBA1C 6.6 (H) 04/23/2023     Micro Results Recent Results (from the past 240 hours)  MRSA Next Gen by PCR, Nasal     Status: None   Collection Time: 05/19/23 11:40 AM   Specimen: Nasal Mucosa; Nasal Swab  Result Value Ref Range Status   MRSA by PCR Next Gen NOT DETECTED NOT DETECTED Final    Comment: (NOTE) The GeneXpert MRSA Assay (FDA approved for NASAL specimens only), is one component of a comprehensive MRSA colonization surveillance program. It is  not intended to diagnose MRSA infection nor to guide or monitor treatment for MRSA infections. Test performance is not FDA approved in patients less than 73 years old. Performed at [facility], [address], [place], [place] [address]   Culture, blood (Routine X 2) w Reflex to ID Panel     Status: None (Preliminary result)   Collection Time: 05/21/23 10:10 AM   Specimen: BLOOD RIGHT HAND  Result Value Ref Range Status   Specimen Description BLOOD RIGHT HAND  Final   Special Requests   Final    BOTTLES DRAWN AEROBIC AND ANAEROBIC Blood Culture adequate volume   Culture   Final    NO GROWTH 2  DAYS Performed at [facility], [address], [place], [place] [address]    Report Status PENDING  Incomplete  Culture, blood (Routine X 2) w Reflex to ID Panel     Status: None (Preliminary result)   Collection Time: 05/21/23 10:18 AM   Specimen: BLOOD LEFT ARM  Result Value Ref Range Status   Specimen Description BLOOD LEFT ARM  Final   Special Requests   Final    BOTTLES DRAWN AEROBIC AND ANAEROBIC Blood Culture adequate volume   Culture   Final    NO GROWTH 2 DAYS Performed at [facility], [address] [place], [place] [address]    Report Status PENDING  Incomplete    Radiology Report VAS [place] LOWER EXTREMITY VENOUS (DVT) Result Date: 05/21/2023  Lower Venous DVT Study Patient Name:  BURHAN BARHAM Haider  Date of Exam:   05/21/2023 Medical Rec #: [id number]      Accession #:    [id number] Date of Birth: 1932-06-06      Patient Gender: M Patient Age:   47 years Exam Location:  [facility] Procedure:      VAS [place] LOWER EXTREMITY VENOUS (DVT) Referring Phys: Vilma Meckel --------------------------------------------------------------------------------  Indications: Fever, A-fib, seizure.  Anticoagulation: Eliquis. Limitations: Patient coughing throughout exam interfering with compressions and dopplers. Comparison Study: No prior exam. Performing Technologist: Fernande Bras  Examination Guidelines: A complete evaluation includes B-mode imaging, spectral Doppler, color Doppler, and power Doppler as needed of all accessible portions of each vessel. Bilateral testing is considered an integral part of a complete examination. Limited examinations for reoccurring indications may be performed as noted. The reflux portion of the exam is performed with the patient in reverse Trendelenburg.  +---------+---------------+---------+-----------+----------+--------------+ RIGHT    CompressibilityPhasicitySpontaneityPropertiesThrombus Aging  +---------+---------------+---------+-----------+----------+--------------+ CFV      Full           Yes      Yes                                 +---------+---------------+---------+-----------+----------+--------------+ SFJ      Full           Yes      Yes                                 +---------+---------------+---------+-----------+----------+--------------+ FV Prox  Full                                                        +---------+---------------+---------+-----------+----------+--------------+  FV Mid   Full                                                        +---------+---------------+---------+-----------+----------+--------------+ FV DistalFull                                                        +---------+---------------+---------+-----------+----------+--------------+ PFV      Full                                                        +---------+---------------+---------+-----------+----------+--------------+ POP      Full           Yes      Yes                                 +---------+---------------+---------+-----------+----------+--------------+ PTV      Full                                                        +---------+---------------+---------+-----------+----------+--------------+ PERO     Full                                                        +---------+---------------+---------+-----------+----------+--------------+   +---------+---------------+---------+-----------+----------+--------------+ LEFT     CompressibilityPhasicitySpontaneityPropertiesThrombus Aging +---------+---------------+---------+-----------+----------+--------------+ CFV      Full           Yes      Yes                                 +---------+---------------+---------+-----------+----------+--------------+ SFJ      Full           Yes      Yes                                  +---------+---------------+---------+-----------+----------+--------------+ FV Prox  Full                                                        +---------+---------------+---------+-----------+----------+--------------+ FV Mid   Full                                                        +---------+---------------+---------+-----------+----------+--------------+  FV DistalFull                                                        +---------+---------------+---------+-----------+----------+--------------+ PFV      Full                                                        +---------+---------------+---------+-----------+----------+--------------+ POP      Full           Yes      Yes                                 +---------+---------------+---------+-----------+----------+--------------+ PTV      Full                                                        +---------+---------------+---------+-----------+----------+--------------+ PERO     Full                                                        +---------+---------------+---------+-----------+----------+--------------+     Summary: BILATERAL: - No evidence of deep vein thrombosis seen in the lower extremities, bilaterally. -No evidence of popliteal cyst, bilaterally.   *See table(s) above for measurements and observations. Electronically signed by Heath Lark on 05/21/2023 at 6:48:29 PM.    Final    DG Swallowing Func-Speech Pathology Result Date: 05/21/2023 Table formatting from the original result was not included. Modified Barium Swallow Study Patient Details Name: JAYRON MAQUEDA MRN: [id number] Date of Birth: Aug 12, 1932 Today's Date: 05/21/2023 HPI/PMH: HPI: Patient is an 89 y.o. male with PMH: DM, a-fib, hiatal hernia, hearing loss (bilateral hearing aids). He presented to the hospital on 05/19/23 with confusion followed by sudden onset of seizure activity. He was transported to the hospital by EMS. In ER he  was not protecting his airway and was intubated. He was extubated on 04/22/23 and kept NPO awaiting SLP swallow evaluation. Clinical Impression: Clinical Impression: Patient presents with an oropharyngeal dysphagia as per this MBS with silent aspiration occuring during and after initiation of swallow with thin liquids. (PAS 8)  Penetration above the vocal cords (PAS 3) and to the vocal cords (PAS 5) that did not clear occured with thin and nectar thick liquids when swallow was delayed to level of pyriform sinus. When swallow was initiated at level of vallecular sinus or posterior laryngeal surface of the epiglottis, no penetration or aspiration observed. Cued cough was effective to expel penetrate however barium transited back into laryngeal vestibule unless swallow was initiated immediately after cough. One instance of silent aspiration of trace amount of nectar thick liquid occured which was preciptated by delayed aspiration of thin liquid residuals. Mild to moderate amount of vallecular residuals and base of tongue residuals remained after initial  swallow with solids and liquids but with clearance of some but not all with subsequent swallows. PES opening and bolus transit through PES and upper esophagus was [facility] and no retrograde movement of barium observed. Factors that may increase risk of adverse event in presence of aspiration Rubye Oaks & Clearance Coots 2021): Factors that may increase risk of adverse event in presence of aspiration Rubye Oaks & Clearance Coots 2021): Frail or deconditioned Recommendations/Plan: Swallowing Evaluation Recommendations Swallowing Evaluation Recommendations Recommendations: PO diet PO Diet Recommendation: Dysphagia 2 (Finely chopped); Mildly thick liquids (Level 2, nectar thick) Liquid Administration via: Cup; Straw Medication Administration: Crushed with puree Supervision: Patient able to self-feed; Staff to assist with self-feeding; Full supervision/cueing for swallowing strategies Swallowing  strategies  : Minimize environmental distractions; Slow rate; Small bites/sips; Clear throat intermittently Postural changes: Position pt fully upright for meals Oral care recommendations: Oral care BID (2x/day) Caregiver Recommendations: Remove water pitcher; Avoid jello, ice cream, thin soups, popsicles; Have oral suction available Treatment Plan Treatment Plan Treatment recommendations: Therapy as outlined in treatment plan below Follow-up recommendations: Skilled nursing-short term rehab (<3 hours/day) Functional status assessment: Patient has had a recent decline in their functional status and demonstrates the ability to make significant improvements in function in a reasonable and predictable amount of time. Treatment frequency: Min 2x/week Treatment duration: 1 week Interventions: Aspiration precaution training; Trials of upgraded texture/liquids; Diet toleration management by SLP; Patient/family education; Compensatory techniques Recommendations Recommendations for follow up therapy are one component of a multi-disciplinary discharge planning process, led by the attending physician.  Recommendations may be updated based on patient status, additional functional criteria and insurance authorization. Assessment: Orofacial Exam: Orofacial Exam Oral Cavity - Dentition: Adequate natural dentition; Missing dentition; Other (Comment) (reports he has bottom partials which are likely at home) Anatomy: Anatomy: WFL Boluses Administered: Boluses Administered Boluses Administered: Thin liquids (Level 0); Mildly thick liquids (Level 2, nectar thick); Moderately thick liquids (Level 3, honey thick); Puree; Solid  Oral Impairment Domain: Oral Impairment Domain Lip Closure: No labial escape Tongue control during bolus hold: Not tested Bolus preparation/mastication: Slow prolonged chewing/mashing with complete recollection Bolus transport/lingual motion: Slow tongue motion Oral residue: Trace residue lining oral structures  Location of oral residue : Tongue Initiation of pharyngeal swallow : Posterior laryngeal surface of the epiglottis; Pyriform sinuses  Pharyngeal Impairment Domain: Pharyngeal Impairment Domain Soft palate elevation: No bolus between soft palate (SP)/pharyngeal wall (PW) Laryngeal elevation: Complete superior movement of thyroid cartilage with complete approximation of arytenoids to epiglottic petiole Anterior hyoid excursion: Partial anterior movement Epiglottic movement: Complete inversion Laryngeal vestibule closure: Incomplete, narrow column air/contrast in laryngeal vestibule Pharyngeal stripping wave : Present - complete Pharyngoesophageal segment opening: Complete distension and complete duration, no obstruction of flow Tongue base retraction: No contrast between tongue base and posterior pharyngeal wall (PPW) Pharyngeal residue: Collection of residue within or on pharyngeal structures Location of pharyngeal residue: Valleculae; Tongue base  Esophageal Impairment Domain: Esophageal Impairment Domain Esophageal clearance upright position: Complete clearance, esophageal coating Pill: No data recorded Penetration/Aspiration Scale Score: Penetration/Aspiration Scale Score 1.  Material does not enter airway: Moderately thick liquids (Level 3, honey thick); Puree; Solid 3.  Material enters airway, remains ABOVE vocal cords and not ejected out: Mildly thick liquids (Level 2, nectar thick) 5.  Material enters airway, CONTACTS cords and not ejected out: Thin liquids (Level 0); Mildly thick liquids (Level 2, nectar thick) 8.  Material enters airway, passes BELOW cords without attempt by patient to eject out (silent aspiration) : Thin liquids (Level 0)  Compensatory Strategies: Compensatory Strategies Compensatory strategies: Yes Straw: Effective Effective Straw: Mildly thick liquid (Level 2, nectar thick)   General Information: No data recorded Diet Prior to this Study: Dysphagia 2 (finely chopped); Thin liquids (Level  0)   No data recorded  Respiratory Status: WFL   Supplemental O2: None (Room air)   History of Recent Intubation: Yes  Behavior/Cognition: Alert; Cooperative; Lethargic/Drowsy Self-Feeding Abilities: Able to self-feed; Needs set-up for self-feeding Baseline vocal quality/speech: Normal Volitional Cough: Able to elicit Volitional Swallow: Able to elicit Exam Limitations: No limitations Goal Planning: Prognosis for improved oropharyngeal function: Good No data recorded No data recorded Patient/Family Stated Goal: patient has been requesting PO's per RN Consulted and agree with results and recommendations: Patient Pain: Pain Assessment Pain Assessment: Faces Faces Pain Scale: 0 Breathing: 0 Negative Vocalization: 0 Facial Expression: 0 Body Language: 0 Consolability: 0 PAINAD Score: 0 Facial Expression: 0 Body Movements: 0 Muscle Tension: 0 Compliance with ventilator (intubated pts.): 0 Vocalization (extubated pts.): N/A CPOT Total: 0 End of Session: Start Time:SLP Start Time (ACUTE ONLY): 1330 Stop Time: SLP Stop Time (ACUTE ONLY): 1350 Time Calculation:SLP Time Calculation (min) (ACUTE ONLY): 20 min Charges: SLP Evaluations $ SLP Speech Visit: 1 Visit SLP Evaluations $BSS Swallow: 1 Procedure $MBS Swallow: 1 Procedure SLP visit diagnosis: SLP Visit Diagnosis: Dysphagia, oropharyngeal phase (R13.12) Past Medical History: Past Medical History: Diagnosis Date  Actinic keratosis   Dr. Terri Piedra  Atrial fibrillation [facility])   BPH (benign prostatic hyperplasia)   controlled on Hytrin  Colon polyps   Dr. Ewing Schlein  Diabetes mellitus 2004  Diverticulosis   ED (erectile dysfunction)   Gallstones   Hearing loss   high frequency  Hiatal hernia   small, noted on EGD 10/2010  LAE (left atrial enlargement) 01/2019  Moderate, Noted on ECHO  Melanoma (HCC)   right arm  Microalbuminuria   on ACEI for microalbuminuria (does NOT have HTN)  Pneumonia 01/2019  Bilateral, noted on CT  Pure hypercholesterolemia 2006  Traumatic partial tear of biceps  tendon 01/2009  "popeye" injury on right  Wears glasses   Wears hearing aid in both ears   Wears partial dentures   lower  Wears partial dentures   lower Past Surgical History: Past Surgical History: Procedure Laterality Date  APPENDECTOMY  1955  BILIARY STENT PLACEMENT N/A 01/28/2019  Procedure: BILIARY STENT PLACEMENT;  Surgeon: Willis Modena, MD;  Location: [facility]COPY;  Service: Endoscopy;  Laterality: N/A;  CATARACT EXTRACTION Bilateral R 05/07/15, L 05/21/15  Dr. Dione Booze  CHOLECYSTECTOMY N/A 01/27/2019  Procedure: LAPAROSCOPIC CHOLECYSTECTOMY WITH INTRAOPEREATIVE CHOLANGIOGRAM;  Surgeon: Ovidio Kin, MD;  Location: [facility] ORS;  Service: General;  Laterality: N/A;  COLONOSCOPY  11/09, 10/29/10  Dr. Ewing Schlein  ERCP N/A 01/28/2019  Procedure: ENDOSCOPIC RETROGRADE CHOLANGIOPANCREATOGRAPHY (ERCP);  Surgeon: Willis Modena, MD;  Location: [facility];  Service: Endoscopy;  Laterality: N/A;  ERCP N/A 04/12/2019  Procedure: ENDOSCOPIC RETROGRADE CHOLANGIOPANCREATOGRAPHY (ERCP);  Surgeon: Vida Rigger, MD;  Location: [facility];  Service: Endoscopy;  Laterality: N/A;  stent removal  ESOPHAGOGASTRODUODENOSCOPY  10/29/10  Dr. Ewing Schlein  INGUINAL HERNIA REPAIR Right 1989  MELANOMA EXCISION  10/08/10  R upper arm, Dr. Daphine Deutscher  MELANOMA EXCISION  03/16/2012  Procedure: MELANOMA EXCISION;  Surgeon: Valarie Merino, MD;  Location: [facility];  Service: General;  Laterality: Right;  excision of melanoma of right arm  REMOVAL OF STONES  01/28/2019  Procedure: REMOVAL OF STONES;  Surgeon: Willis Modena, MD;  Location: [facility];  Service: Endoscopy;;  SPHINCTEROTOMY  01/28/2019  Procedure: SPHINCTEROTOMY;  Surgeon: Willis Modena, MD;  Location: [facility];  Service: Endoscopy;;  SPHINCTEROTOMY  04/12/2019  Procedure: Dennison Mascot;  Surgeon: Vida Rigger, MD;  Location: [facility];  Service: Endoscopy;;  stone sludge  STENT REMOVAL  04/12/2019  Procedure: STENT REMOVAL;  Surgeon: Vida Rigger, MD;  Location: [facility];   Service: Endoscopy;;  TOTAL KNEE ARTHROPLASTY Right 2012  TOTAL KNEE ARTHROPLASTY Left 2005 Angela Nevin, MA, CCC-SLP Speech Therapy'    Signature  -   Susa Raring M.D on 05/23/2023 at 7:53 AM   -  To page go to [email or web address]

## 2023-05-23 NOTE — Progress Notes (Signed)
 Patient had a 9 beat run of vtach, icu physician notified, order for labs done. Mag is 1.5, K+ is 3.6, called on-call icu number for replacement.

## 2023-05-23 NOTE — Progress Notes (Addendum)
   RN reported that patient has low-grade temperature.  O2 sat dropped to 89% room air currently 94% on 4 L oxygen.  Physical exam showing right-sided crackles patient complaining about congestion and cough.   Patient already completed 5 days course of ceftriaxone and azithromycin for community-acquired pneumonia. Given patient was intubated and there is risk of development of hospital-acquired pneumonia.  Checking blood culture, sputum culture, CBC, BMP and respiratory panel. -If chest x-ray showing evidence of pneumonia will start IV Vanco and Zosyn to cover hospital-acquired pneumonia. -Continue supplemental oxygen, pulse ox, supportive care and Xopenex breathing treatment.  Update, chest x-ray no active disease process.  Holding any antibiotic as of now. BMP showing elevated creatinine 1.28.  Elevated BNP around 454.  Blood pressure within good range.  History of combined systolic and diastolic heart failure.  Holding any IV fluid at this time.  Continue to trend renal function.   Tereasa Coop, MD [facility] 05/23/2023, 11:06 PM

## 2023-05-23 NOTE — Plan of Care (Signed)
  Problem: Coping: Goal: Ability to adjust to condition or change in health will improve Outcome: Progressing   Problem: Fluid Volume: Goal: Ability to maintain a balanced intake and output will improve Outcome: Progressing   Problem: Metabolic: Goal: Ability to maintain appropriate glucose levels will improve Outcome: Progressing   Problem: Skin Integrity: Goal: Risk for impaired skin integrity will decrease Outcome: Progressing   Problem: Clinical Measurements: Goal: Will remain free from infection Outcome: Progressing   Problem: Activity: Goal: Risk for activity intolerance will decrease Outcome: Progressing

## 2023-05-23 NOTE — TOC Initial Note (Addendum)
 Transition of Care [facility]) - Initial/Assessment Note    Patient Details  Name: Andre Walker MRN: [id number] Date of Birth: 05/25/1932  Transition of Care [facility]) CM/SW Contact:    Mearl Latin, LCSW Phone Number: 05/23/2023, 10:34 AM  Clinical Narrative:                 10:30a-CSW received consult for possible SNF placement at time of discharge. CSW spoke with patient's spouse at bedside. She reported that she is currently unable to care for patient at their Independent Living apartment at [facility] given patient's current physical needs and fall risk. She reported agreement with SNF side at [facility]. Patient will require PTAR for transport.   CSW spoke with Mia at [facility] [phone]). She will speak with their DON and call CSW back about availability in the event patient is ready for discharge tomorrow.   11:09 AM-Mia called CSW back and stated she can follow up w/weekend CSW (provided her contact) once she knows room number for tomorrow.    Skilled Nursing Rehab Facilities-   [email or web address]   Ratings out of 5 stars (5 the highest)   Name Address  Phone # Quality Care Staffing Health Inspection Overall  [facility] [address] [phone] 3 3 4 4   [facility] [address], [place] [phone] 5 1 4 4   [facility] [address], [facility] [phone] 2 2 2 2   [facility] [address], [place] [phone] 5 2 4 5   [facility]  [address], [place] [phone] 4 3 5 5   [facility] [address] [facility] [phone] 4 2 2 2   [facility] [address], [place] [phone] 5 1 2 2   [facility] [address], [place] [phone] 2 1 3 2   [address] ([facility]) [address] [place] [phone] 2 3 3 3   [facility] [address] [place], [place] [phone] 3 3 2 2   [facility] ([place]) [address], [place] [phone] 3 1 1 1   [facility] [address] [place] [phone] 2 3 4 4   [facility] [address], [place] [phone] 4 4 3 3   [facility] [address], [place] [phone] 1 2 4 3           [facility] [address], [place] [phone] 2 1 4 3   [facility] [address] [place] [phone] 4 2 1 1   [facility]  [address], [place], [place] [address] [phone] 2 2 2 2   [facility] [address] [phone] 3 2 4 4   [facility] [address] [place] [phone] 2 1 2 1   [facility] (No [employer]) [address], [place] [phone] 5 4 5 5   [facility] [address] [facility] [phone] 3 4 2 2   [facility] [address] [place] [phone] 2 1 1 1   [facility] [address] [place] [phone] 4 2 5 5   [facility] [address] [place] [phone] 4 1 1 1   [facility] [address] [place], [place] [phone] 2 2 3 3           [facility]  [phone] 3 1 1 1   [facility] [address], [place] Clines  [phone] 3 3 3 3   [facility] (No [employer]) [address], [place] [phone] 2 2 4  4  [facility]) [address], [place]an [phone] 2 1 1 1   [facility] [address], [place]an [phone] 4 3 5 5   [facility] [address], [place] [phone] 1 1 1 1           [facility] [address] [place], [place] [phone] 5 4 5 5   [facility])  [address] [place] [phone] 1 1 2 1   [facility]) [address] [place], [place] [phone]  2 4 4   [facility] [address], [place] [phone] 3 5 5 5   [address] [place], [place] [phone] 4 2 2 2   [facility] [address] [place]  [phone] 2 1 3 2      Expected Discharge Plan: Skilled Nursing Facility Barriers to Discharge: Continued Medical Work up, SNF Pending bed offer   Patient Goals and CMS Choice Patient states their goals for this hospitalization and ongoing recovery are:: Rehab CMS Medicare.gov Compare Post Acute Care list provided to:: Patient Represenative (must comment) Choice offered to / list presented to : Spouse [place] ownership interest in [facility].provided to:: Spouse    Expected Discharge Plan and Services In-house Referral: Clinical Social Work   Post Acute Care Choice: Skilled Nursing Facility Living arrangements for the past 2 months: Independent Living Facility                                      Prior Living Arrangements/Services Living arrangements for the past 2 months: Independent Living Facility Lives with:: Spouse Patient language and need for interpreter reviewed:: Yes Do you feel safe going back to the place where you live?: Yes      Need for Family Participation in Patient Care: Yes (Comment) Care giver support system in place?: Yes (comment) Current home services: DME Criminal Activity/Legal Involvement Pertinent to Current Situation/Hospitalization: No - Comment as needed  Activities of Daily Living   ADL Screening (condition at time of admission) Independently performs ADLs?: Yes (appropriate for developmental age) Is the patient deaf or have difficulty hearing?: Yes Does the patient have difficulty seeing, even when wearing glasses/contacts?: No Does the patient have difficulty concentrating, remembering, or making decisions?: Yes  Permission Sought/Granted Permission sought to share information with : Facility Medical sales representative, Family Supports Permission granted to share information with : Yes, Verbal Permission Granted  Share Information with NAME: [name]  Permission granted to share info w AGENCY: [facility]  Permission  granted to share info w Relationship: Spouse  Permission granted to share info w Contact Information: [phone]  Emotional Assessment Appearance:: Appears stated age Attitude/Demeanor/Rapport: Unable to Assess Affect (typically observed): Unable to Assess Orientation: : Oriented to Self Alcohol / Substance Use: Not Applicable Psych Involvement: No (comment)  Admission diagnosis:  Seizure (HCC) [R56.9] Altered mental status, unspecified altered mental status type [R41.82] Patient Active Problem List   Diagnosis Date Noted   Seizure (HCC) 05/19/2023   Syncope 04/22/2023   Acquired thrombophilia (HCC) 08/05/2022   Acute cholecystitis 01/25/2019   Chronic atrial fibrillation (HCC) 01/25/2019   Medication monitoring encounter 08/02/2018   Diabetes mellitus with microalbuminuria (HCC) 08/02/2018   Subclinical hypothyroidism 08/02/2018   Type 2 diabetes mellitus with hypercholesterolemia (HCC) 08/02/2018   Elevated serum creatinine    UTI (urinary tract infection) 01/24/2017   AKI (acute kidney injury) (HCC) 01/24/2017  Squamous cell carcinoma of leg, right 01/14/2016   Senile purpura (HCC) 09/18/2014   Increased prostate specific antigen (PSA) velocity 03/22/2014   Benign prostatic hyperplasia 03/22/2014   Melanoma of right arm 03/05/2012   Actinic keratoses 12/03/2011   Pure hypercholesterolemia 12/02/2010   Type 2 diabetes mellitus, controlled (HCC) 12/02/2010   PCP:  Joselyn Arrow, MD Pharmacy:   [facility] [address], [place] - [address] [place] [address] Phone: [phone] Fax: [phone]     Social Drivers of Health (SDOH) Social History: SDOH Screenings   Food Insecurity: Food Insecurity Present (05/19/2023)  Housing: High Risk (05/19/2023)  Transportation Needs: No Transportation Needs (05/19/2023)  Utilities: Not At Risk (05/19/2023)  Depression (PHQ2-9): Low Risk  (03/12/2023)  Social Connections:  Moderately Integrated (05/19/2023)  Tobacco Use: Low Risk  (05/19/2023)   SDOH Interventions:     Readmission Risk Interventions     No data to display

## 2023-05-23 NOTE — [place] FL2 (Signed)
 [place] MEDICAID FL2 LEVEL OF CARE FORM     IDENTIFICATION  Patient Name: Andre Walker Birthdate: April 18, 1932 Sex: male Admission Date (Current Location): 05/19/2023  [facility] and [place] Number:  Producer, television/film/video and Address:  [facility] [place]. [facility], [address], [place], [place] [address]      Provider Number: [id number]  Attending Physician Name and Address:  Leroy Sea, MD  Relative Name and Phone Number:       Current Level of Care: Hospital Recommended Level of Care: Skilled Nursing Facility Prior Approval Number:    Date Approved/Denied:   PASRR Number: [id number]  Discharge Plan: SNF    Current Diagnoses: Patient Active Problem List   Diagnosis Date Noted   Seizure (HCC) 05/19/2023   Syncope 04/22/2023   Acquired thrombophilia (HCC) 08/05/2022   Acute cholecystitis 01/25/2019   Chronic atrial fibrillation (HCC) 01/25/2019   Medication monitoring encounter 08/02/2018   Diabetes mellitus with microalbuminuria (HCC) 08/02/2018   Subclinical hypothyroidism 08/02/2018   Type 2 diabetes mellitus with hypercholesterolemia (HCC) 08/02/2018   Elevated serum creatinine    UTI (urinary tract infection) 01/24/2017   AKI (acute kidney injury) (HCC) 01/24/2017   Squamous cell carcinoma of leg, right 01/14/2016   Senile purpura (HCC) 09/18/2014   Increased prostate specific antigen (PSA) velocity 03/22/2014   Benign prostatic hyperplasia 03/22/2014   Melanoma of right arm 03/05/2012   Actinic keratoses 12/03/2011   Pure hypercholesterolemia 12/02/2010   Type 2 diabetes mellitus, controlled (HCC) 12/02/2010    Orientation RESPIRATION BLADDER Height & Weight     Self  Normal Incontinent, External catheter Weight: 190 lb 4.1 oz (86.3 kg) Height:  5\' 10"  (177.8 cm)  BEHAVIORAL SYMPTOMS/MOOD NEUROLOGICAL BOWEL NUTRITION STATUS      Incontinent Diet (See dc summary)  AMBULATORY STATUS COMMUNICATION OF NEEDS Skin   Extensive Assist  Verbally Normal                       Personal Care Assistance Level of Assistance  Bathing, Feeding, Dressing Bathing Assistance: Maximum assistance Feeding assistance: Limited assistance Dressing Assistance: Maximum assistance     Functional Limitations Info  Hearing   Hearing Info: Impaired      SPECIAL CARE FACTORS FREQUENCY  PT (By licensed PT), OT (By licensed OT), Speech therapy     PT Frequency: 5x/week OT Frequency: 5x/week     Speech Therapy Frequency: eval and treat      Contractures Contractures Info: Not present    Additional Factors Info  Code Status, Allergies, Insulin Sliding Scale Code Status Info: Full Allergies Info: Penicillins   Insulin Sliding Scale Info: See dc summary       Current Medications (05/23/2023):  This is the current hospital active medication list Current Facility-Administered Medications  Medication Dose Route Frequency Provider Last Rate Last Admin   acetaminophen (TYLENOL) tablet 650 mg  650 mg Per Tube Q6H PRN Hunsucker, Lesia Sago, MD   650 mg at 05/20/23 0108   apixaban (ELIQUIS) tablet 5 mg  5 mg Oral BID Hunsucker, Lesia Sago, MD   5 mg at 05/23/23 0850   cefTRIAXone (ROCEPHIN) 1 g in sodium chloride 0.9 % 100 mL IVPB  1 g Intravenous Q24H Hunsucker, Lesia Sago, MD 200 mL/hr at 05/22/23 2022 1 g at 05/22/23 2022   Chlorhexidine Gluconate Cloth 2 % PADS 6 each  6 each Topical Daily Hunsucker, Lesia Sago, MD   6 each at 05/21/23 2116  docusate sodium (COLACE) capsule 100 mg  100 mg Oral BID PRN Hunsucker, Lesia Sago, MD   100 mg at 05/22/23 0834   guaiFENesin (ROBITUSSIN) 100 MG/5ML liquid 15 mL  15 mL Oral Q6H Hunsucker, Lesia Sago, MD   15 mL at 05/23/23 0629   insulin aspart (novoLOG) injection 0-15 Units  0-15 Units Subcutaneous Q4H Hunsucker, Lesia Sago, MD   3 Units at 05/23/23 0848   ipratropium-albuterol (DUONEB) 0.5-2.5 (3) MG/3ML nebulizer solution 3 mL  3 mL Nebulization Q4H PRN Hunsucker, Lesia Sago, MD   3 mL at  05/22/23 0804   levETIRAcetam (KEPPRA) tablet 500 mg  500 mg Oral BID Hunsucker, Lesia Sago, MD   500 mg at 05/23/23 0851   metoprolol tartrate (LOPRESSOR) tablet 25 mg  25 mg Oral BID Leroy Sea, MD   25 mg at 05/23/23 1610   Oral care mouth rinse  15 mL Mouth Rinse 4 times per day Karren Burly, MD   15 mL at 05/23/23 9604   Oral care mouth rinse  15 mL Mouth Rinse PRN Hunsucker, Lesia Sago, MD       pantoprazole (PROTONIX) EC tablet 40 mg  40 mg Oral Daily Leroy Sea, MD       polyethylene glycol (MIRALAX / GLYCOLAX) packet 17 g  17 g Oral Daily PRN Hunsucker, Lesia Sago, MD   17 g at 05/22/23 0834   simvastatin (ZOCOR) tablet 20 mg  20 mg Oral q1800 Hunsucker, Lesia Sago, MD   20 mg at 05/22/23 1734   sodium chloride HYPERTONIC 3 % nebulizer solution 4 mL  4 mL Nebulization TID Hunsucker, Lesia Sago, MD   4 mL at 05/23/23 5409     Discharge Medications: Please see discharge summary for a list of discharge medications.  Relevant Imaging Results:  Relevant Lab Results:   Additional Information SSN: [id number] [place], [place]

## 2023-05-24 DIAGNOSIS — Z515 Encounter for palliative care: Secondary | ICD-10-CM | POA: Diagnosis not present

## 2023-05-24 DIAGNOSIS — Z711 Person with feared health complaint in whom no diagnosis is made: Secondary | ICD-10-CM

## 2023-05-24 DIAGNOSIS — Z7189 Other specified counseling: Secondary | ICD-10-CM

## 2023-05-24 DIAGNOSIS — R569 Unspecified convulsions: Secondary | ICD-10-CM | POA: Diagnosis not present

## 2023-05-24 LAB — BASIC METABOLIC PANEL WITH GFR
Anion gap: 9 (ref 5–15)
Anion gap: 7 (ref 5–15)
BUN: 30 mg/dL — ABNORMAL HIGH (ref 8–23)
BUN: 29 mg/dL — ABNORMAL HIGH (ref 8–23)
CO2: 22 mmol/L (ref 22–32)
CO2: 24 mmol/L (ref 22–32)
Calcium: 9 mg/dL (ref 8.9–10.3)
Calcium: 8.8 mg/dL — ABNORMAL LOW (ref 8.9–10.3)
Chloride: 106 mmol/L (ref 98–111)
Chloride: 108 mmol/L (ref 98–111)
Creatinine, Ser: 1.28 mg/dL — ABNORMAL HIGH (ref 0.61–1.24)
Creatinine, Ser: 1.24 mg/dL (ref 0.61–1.24)
GFR, Estimated: 53 mL/min — ABNORMAL LOW (ref >60)
GFR, Estimated: 56 mL/min — ABNORMAL LOW (ref >60)
Glucose, Bld: 177 mg/dL — ABNORMAL HIGH (ref 70–99)
Glucose, Bld: 182 mg/dL — ABNORMAL HIGH (ref 70–99)
Potassium: 4 mmol/L (ref 3.5–5.1)
Potassium: 4.2 mmol/L (ref 3.5–5.1)
Sodium: 137 mmol/L (ref 135–145)
Sodium: 139 mmol/L (ref 135–145)

## 2023-05-24 LAB — LACTIC ACID, PLASMA
Lactic Acid, Venous: 1.3 mmol/L (ref 0.5–1.9)
Lactic Acid, Venous: 1.1 mmol/L (ref 0.5–1.9)

## 2023-05-24 LAB — CBC WITH DIFFERENTIAL/PLATELET
Abs Immature Granulocytes: 0.13 10*3/uL — ABNORMAL HIGH (ref 0.00–0.07)
Basophils Absolute: 0 10*3/uL (ref 0.0–0.1)
Basophils Relative: 0 %
Eosinophils Absolute: 0.1 10*3/uL (ref 0.0–0.5)
Eosinophils Relative: 1 %
HCT: 33.7 % — ABNORMAL LOW (ref 39.0–52.0)
Hemoglobin: 11.5 g/dL — ABNORMAL LOW (ref 13.0–17.0)
Immature Granulocytes: 1 %
Lymphocytes Relative: 13 %
Lymphs Abs: 1.9 10*3/uL (ref 0.7–4.0)
MCH: 32.8 pg (ref 26.0–34.0)
MCHC: 34.1 g/dL (ref 30.0–36.0)
MCV: 96 fL (ref 80.0–100.0)
Monocytes Absolute: 1.5 10*3/uL — ABNORMAL HIGH (ref 0.1–1.0)
Monocytes Relative: 10 %
Neutro Abs: 11 10*3/uL — ABNORMAL HIGH (ref 1.7–7.7)
Neutrophils Relative %: 75 %
Platelets: 231 10*3/uL (ref 150–400)
RBC: 3.51 MIL/uL — ABNORMAL LOW (ref 4.22–5.81)
RDW: 12.9 % (ref 11.5–15.5)
WBC: 14.6 10*3/uL — ABNORMAL HIGH (ref 4.0–10.5)
nRBC: 0 % (ref 0.0–0.2)

## 2023-05-24 LAB — RESPIRATORY PANEL BY PCR

## 2023-05-24 LAB — HEPATIC FUNCTION PANEL
ALT: 21 U/L (ref 0–44)
AST: 20 U/L (ref 15–41)
Albumin: 2.7 g/dL — ABNORMAL LOW (ref 3.5–5.0)
Alkaline Phosphatase: 74 U/L (ref 38–126)
Bilirubin, Direct: 0.3 mg/dL — ABNORMAL HIGH (ref 0.0–0.2)
Indirect Bilirubin: 0.8 mg/dL (ref 0.3–0.9)
Total Bilirubin: 1.1 mg/dL (ref 0.0–1.2)
Total Protein: 5.5 g/dL — ABNORMAL LOW (ref 6.5–8.1)

## 2023-05-24 LAB — TSH: TSH: 3.688 u[IU]/mL (ref 0.350–4.500)

## 2023-05-24 LAB — BRAIN NATRIURETIC PEPTIDE
B Natriuretic Peptide: 454.9 pg/mL — ABNORMAL HIGH (ref 0.0–100.0)
B Natriuretic Peptide: 265.1 pg/mL — ABNORMAL HIGH (ref 0.0–100.0)

## 2023-05-24 LAB — CORTISOL: Cortisol, Plasma: 18.7 ug/dL

## 2023-05-24 LAB — MAGNESIUM: Magnesium: 1.9 mg/dL (ref 1.7–2.4)

## 2023-05-24 LAB — C-REACTIVE PROTEIN: CRP: 20.9 mg/dL — ABNORMAL HIGH (ref <1.0)

## 2023-05-24 LAB — URINALYSIS, ROUTINE W REFLEX MICROSCOPIC
Bilirubin Urine: NEGATIVE
Glucose, UA: NEGATIVE mg/dL
Ketones, ur: NEGATIVE mg/dL
Nitrite: NEGATIVE
Protein, ur: 100 mg/dL — AB
RBC / HPF: >50 RBC/hpf (ref 0–5)
Specific Gravity, Urine: 1.023 (ref 1.005–1.030)
pH: 5 (ref 5.0–8.0)

## 2023-05-24 LAB — GLUCOSE, CAPILLARY
Glucose-Capillary: 159 mg/dL — ABNORMAL HIGH (ref 70–99)
Glucose-Capillary: 159 mg/dL — ABNORMAL HIGH (ref 70–99)
Glucose-Capillary: 169 mg/dL — ABNORMAL HIGH (ref 70–99)
Glucose-Capillary: 145 mg/dL — ABNORMAL HIGH (ref 70–99)

## 2023-05-24 LAB — PHOSPHORUS: Phosphorus: 3.3 mg/dL (ref 2.5–4.6)

## 2023-05-24 LAB — T4, FREE: Free T4: 1.02 ng/dL (ref 0.61–1.12)

## 2023-05-24 LAB — PROCALCITONIN: Procalcitonin: 0.92 ng/mL

## 2023-05-24 MED ORDER — LACTATED RINGERS IV BOLUS: 1000.0000 mL | Freq: Once | INTRAVENOUS | Status: DC

## 2023-05-24 MED ORDER — ACETAMINOPHEN 325 MG PO TABS
650.0000 mg | ORAL_TABLET | Freq: Four times a day (QID) | ORAL | Status: DC | PRN
  Administered 2023-05-24: 650 mg via ORAL

## 2023-05-24 MED ORDER — INSULIN ASPART 100 UNIT/ML IJ SOLN
0.0000 [IU] | Freq: Three times a day (TID) | INTRAMUSCULAR | Status: DC
  Administered 2023-05-24 (×3): 3 [IU] via SUBCUTANEOUS
  Administered 2023-05-25 – 2023-05-26 (×5): 2 [IU] via SUBCUTANEOUS
  Administered 2023-05-26 (×2): 3 [IU] via SUBCUTANEOUS
  Administered 2023-05-26: 2 [IU] via SUBCUTANEOUS
  Administered 2023-05-27 (×2): 3 [IU] via SUBCUTANEOUS
  Administered 2023-05-27 (×2): 2 [IU] via SUBCUTANEOUS

## 2023-05-24 MED ORDER — SODIUM CHLORIDE 0.9 % IV SOLN
2.0000 g | Freq: Two times a day (BID) | INTRAVENOUS | Status: DC
  Administered 2023-05-24: 2 g via INTRAVENOUS
  Filled 2023-05-24: qty 12.5

## 2023-05-24 MED ORDER — LINEZOLID 600 MG PO TABS
600.0000 mg | ORAL_TABLET | Freq: Two times a day (BID) | ORAL | Status: DC
  Administered 2023-05-24 – 2023-05-27 (×5): 600 mg via ORAL
  Filled 2023-05-24 (×7): qty 1

## 2023-05-24 MED ORDER — SCOPOLAMINE 1 MG/3DAYS TD PT72
1.0000 | MEDICATED_PATCH | TRANSDERMAL | Status: DC
  Administered 2023-05-24 – 2023-05-27 (×2): 1.5 mg via TRANSDERMAL
  Filled 2023-05-24 (×2): qty 1

## 2023-05-24 MED ORDER — SODIUM CHLORIDE 0.9 % IV SOLN
1.5000 g | Freq: Four times a day (QID) | INTRAVENOUS | Status: DC
  Administered 2023-05-24 – 2023-05-27 (×13): 1.5 g via INTRAVENOUS
  Filled 2023-05-24 (×16): qty 4

## 2023-05-24 MED ORDER — METOPROLOL TARTRATE 12.5 MG HALF TABLET
12.5000 mg | ORAL_TABLET | Freq: Two times a day (BID) | ORAL | Status: DC
  Administered 2023-05-24 – 2023-05-27 (×5): 12.5 mg via ORAL
  Filled 2023-05-24 (×7): qty 1

## 2023-05-24 MED ORDER — VANCOMYCIN HCL 2000 MG/400ML IV SOLN
2000.0000 mg | Freq: Once | INTRAVENOUS | Status: AC
  Administered 2023-05-24: 2000 mg via INTRAVENOUS
  Filled 2023-05-24: qty 400

## 2023-05-24 MED ORDER — LACTATED RINGERS IV SOLN: INTRAVENOUS | Status: AC

## 2023-05-24 MED ORDER — LACTATED RINGERS IV SOLN: INTRAVENOUS | Status: DC

## 2023-05-24 MED ORDER — VANCOMYCIN HCL 500 MG/100ML IV SOLN: 500.0000 mg | Freq: Two times a day (BID) | INTRAVENOUS | Status: DC

## 2023-05-24 MED ORDER — LACTATED RINGERS IV BOLUS
1000.0000 mL | INTRAVENOUS | Status: AC
  Administered 2023-05-24: 1000 mL via INTRAVENOUS

## 2023-05-24 MED ORDER — ALBUMIN HUMAN 25 % IV SOLN
25.0000 g | INTRAVENOUS | Status: AC
  Administered 2023-05-24: 12.5 g via INTRAVENOUS
  Filled 2023-05-24: qty 100

## 2023-05-24 NOTE — Consult Note (Signed)
 Consultation Note Date: 05/24/2023   Patient Name: Andre Walker  DOB: Jan 03, 1933  MRN: [id number]  Age / Sex: 89 y.o., male  PCP: Joselyn Arrow, MD Referring Physician: Leroy Sea, MD  Reason for Consultation: Establishing goals of care  HPI/Patient Profile: 89 y.o. male  with past medical history of AFib on eliquis, DM  admitted on 05/19/2023 with confusion and seizure activity at home/ILF.   Had recurrent seizure for EMS during transport with L gaze. On arrival to ER was not protecting his airway and was intubated. Had recent admission in February for syncope and unresponsiveness without noted seizure on EEG. During this admission, he was seen by neurology and hospitalization was further complicated by pneumonia.   PMT has been consulted to assist with goals of care conversation in the setting of recurrent aspiration and rapid decline.  Clinical Assessment and Goals of Care:  I have reviewed medical records including EPIC notes, labs and imaging, assessed the patient and then met at the bedside with patient's wife and son to discuss diagnosis prognosis, GOC, EOL wishes, disposition and options.  I introduced Palliative Medicine as specialized medical care for people living with serious illness. It focuses on providing relief from the symptoms and stress of a serious illness. The goal is to improve quality of life for both the patient and the family.  We discussed a brief life review of the patient and then focused on their current illness.  The natural disease trajectory and expectations at EOL were discussed.  I attempted to elicit values and goals of care important to the patient.    Medical History Review and Understanding:  Patient's family have a good understanding the severity of patient's illness after discussion with primary attending today.   Social History: Patient and his wife will have been married for 67 years on April 6.  They have 2  sons.  One son is on his way from [place].  They are well supported by several friends and family members.  Their church is also supportive, having prayed for him this morning.  Functional and Nutritional State: Patient has ongoing difficulty with swallowing, worsening weakness and concern for aspiration events.  Palliative Symptoms: Somewhat comfortable at this time  Advance Directives: A detailed discussion regarding advanced directives was had.  Reviewed patient's advance directive on file.  Wife confirms that he has very clear wishes for end-of-life situations.  Code Status: Concepts specific to code status, artifical feeding and hydration, and rehospitalization were considered and discussed.   Discussion: Patient's wife shares that she has prior experience with hospice from the past, as she is the youngest of 45 siblings and she is quite familiar with death and dying.  Son wants to make sure she is clear the difference between palliative care and hospice, which we reviewed in detail.  She is open to hospice if patient continues to decline.   We discussed the implication of patient's profound weakness and lethargy on his ability to swallow/protect his airway.  Patient has always been clear that he would not want a feeding tube.  We briefly discussed the option of comfort feeding and transition to comfort focused care if he does not improve.  She is appreciative of the support and has no further questions at this time.  She is open to further conversations based on how he does.   The difference between aggressive medical intervention and comfort care was considered in light of the patient's goals of care. Hospice and Palliative Care  services outpatient were explained and offered.   Discussed the importance of continued conversation with family and the medical providers regarding overall plan of care and treatment options, ensuring decisions are within the context of the patient's values  and GOCs.   Questions and concerns were addressed.  Hard Choices booklet left for review. The family was encouraged to call with questions or concerns.  PMT will continue to support holistically.   SUMMARY OF RECOMMENDATIONS   -Continue DNR/DNI -Continue current care and allow more time for outcomes.  Patient's wife is open for transition to comfort care/hospice if he fails to improve -Psychosocial and emotional provided -PMT will continue to follow and support   Prognosis:  Poor with high risk for further decline  Discharge Planning: To Be Determined      Primary Diagnoses: Present on Admission: **None**   Physical Exam Vitals and nursing note reviewed.  Constitutional:      General: He is not in acute distress.    Appearance: He is ill-appearing.  Cardiovascular:     Rate and Rhythm: Normal rate.     Heart sounds:     No S4 sounds.  Pulmonary:     Effort: Pulmonary effort is normal. No respiratory distress.  Neurological:     Mental Status: He is lethargic.    Vital Signs: BP (!) 126/95   Pulse 75   Temp 98.4 F (36.9 C) (Axillary)   Resp (!) 25   Ht 5\' 10"  (1.778 m)   Wt 86.3 kg   SpO2 97%   BMI 27.30 kg/m  Pain Scale: 0-10 POSS *See Group Information*: 1-Acceptable,Awake and alert Pain Score: 0-No pain   SpO2: SpO2: 97 % O2 Device:SpO2: 97 % O2 Flow Rate: .O2 Flow Rate (L/min): 4 L/min    MDM: high    Amel Kitch Jeni Salles, PA-C  Palliative Medicine Team Team phone # [phone]  Thank you for allowing the Palliative Medicine Team to assist in the care of this patient. Please utilize secure chat with additional questions, if there is no response within 30 minutes please call the above phone number.  Palliative Medicine Team providers are available by phone from 7am to 7pm daily and can be reached through the team cell phone.  Should this patient require assistance outside of these hours, please call the patient's attending physician.

## 2023-05-24 NOTE — Progress Notes (Signed)
 Per Sepsis protocol, noted Pt received Rocephin @ 1845 prior to Code Sepsis activation @ 0151. Subsequent blood cultures draw @ 2322 prior to Maxipime given @ 0226.

## 2023-05-24 NOTE — Plan of Care (Signed)
  Problem: Coping: Goal: Ability to adjust to condition or change in health will improve Outcome: Progressing   Problem: Skin Integrity: Goal: Risk for impaired skin integrity will decrease Outcome: Progressing   Problem: Education: Goal: Knowledge of General Education information will improve Description: Including pain rating scale, medication(s)/side effects and non-pharmacologic comfort measures Outcome: Progressing   Problem: Activity: Goal: Risk for activity intolerance will decrease Outcome: Progressing   Problem: Nutrition: Goal: Adequate nutrition will be maintained Outcome: Progressing   Problem: Coping: Goal: Level of anxiety will decrease Outcome: Progressing   Problem: Elimination: Goal: Will not experience complications related to bowel motility Outcome: Progressing Goal: Will not experience complications related to urinary retention Outcome: Progressing   Problem: Skin Integrity: Goal: Risk for impaired skin integrity will decrease Outcome: Progressing   Problem: Safety: Goal: Ability to remain free from injury will improve Outcome: Progressing

## 2023-05-24 NOTE — Progress Notes (Signed)
   05/24/23 0134 05/24/23 0135 05/24/23 0136  Assess: MEWS Score  Temp (!) 100.5 F (38.1 C)  --   --   BP (!) 83/59  --   --   MAP (mmHg) (!) 64  --   --   Pulse Rate 75 70 71  ECG Heart Rate 72 70 69  Resp (!) 25 (!) 29  --   Level of Consciousness Alert  --   --   SpO2  --   --  96 %  Assess: MEWS Score  MEWS Temp 1 1 1   MEWS Systolic 1 1 1   MEWS Pulse 0 0 0  MEWS RR 1 2 2   MEWS LOC 0 0 0  MEWS Score 3 4 4   MEWS Score Color Yellow Red Red  Assess: if the MEWS score is Yellow or Red  Were vital signs accurate and taken at a resting state?  --  Yes  --   Does the patient meet 2 or more of the SIRS criteria?  --  Yes  --   Does the patient have a confirmed or suspected source of infection?  --  Yes  --   MEWS guidelines implemented   --  Yes, red  --   Treat  MEWS Interventions  --  Considered administering scheduled or prn medications/treatments as ordered  --   Take Vital Signs  Increase Vital Sign Frequency   --  Red: Q1hr x2, continue Q4hrs until patient remains green for 12hrs  --   Escalate  MEWS: Escalate  --  Red: Discuss with charge nurse and notify provider. Consider notifying RRT. If remains red for 2 hours consider need for higher level of care  --   Notify: Charge Nurse/RN  Name of Charge Nurse/RN Notified  --  Lowella Bandy, RN  --   Provider Notification  Provider Name/Title  --  Tereasa Coop, MD  --   Date Provider Notified  --  05/24/23  --   Time Provider Notified  --  [phone]  --   Method of Notification  --  Page  --   Notification Reason  --  Change in status  --   Provider response  --  See new orders  --   Date of Provider Response  --  05/24/23  --   Time of Provider Response  --  0136  --   Notify: Rapid Response  Name of Rapid Response RN Notified  --   --  Theodoro Grist, RN  Date Rapid Response Notified  --   --  05/24/23  Time Rapid Response Notified  --   --  0136  Assess: SIRS CRITERIA  SIRS Temperature  0 0 0  SIRS Respirations  1 1 1   SIRS Pulse 0 0 0   SIRS WBC 1 1 1   SIRS Score Sum  2 2 2   Contacted Dr. Janalyn Shy. Pt's respirations fluctuate between high 20's and mid 30's. Pt denies SOB, CP. Pt is diaphoretic. Labs ordered including Lactic acid, UA/UC. Pt has new Foley as of 3/29 d/t retention. Urine is Amber and cloudy. Spoke with wife bedside, pt has long history of UTI's. Ix LR bolus 1L ordered, antibiotics.

## 2023-05-24 NOTE — Progress Notes (Signed)
 Speech Language Pathology Treatment: Dysphagia  Patient Details Name: Andre Walker MRN: [id number] DOB: 05-19-1932 Today's Date: 05/24/2023 Time: 0102-7253 SLP Time Calculation (min) (ACUTE ONLY): 49 min  Assessment / Plan / Recommendation Clinical Impression  Pt seen for skilled SLP due to concern for aspiration; family including wife and son report pt coughing significantly with intake yesterday.  Wife also endorses pt coughing overnight- starting at 0200.  Pt is on a PPI - and takes one at home - he denies refluxing over night.     Reviewed MBS with wife and pt's son-and discussed multiple risk factors for aspiration pna including decreased mobility, weakness, baseline occasional wet voice and drooling on the right.   Wife reports pt's drooling and wet voice prior to admission - pt states a year - wife reports more over the last few months.  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    Pt with wet voice and retention of copious amounts of secretions without awareness. Cues to clear his throat and re-swallow was effective to clear wet voice but this then recurred.  Observed pt with nectar thick apple juice - via tsp and cup bolus.   At times pt required verbal cues to swallow - which increases aspiration risk.   Delayed cough noted x1 - which was not productive.  Suspect secretions may be thinning down nectar liquids - and allowing some level of aspiration.  At this time, Mr Bhalla is aspirating at least secretions - and SLP only provided minimal amount of nectar liquids.      In discussion with family, pt - agreeable to floor stock of nectar liquids only for comfort only - observing to assure pt swallows and ceasing intake if pt is coughing.  Anticipate his intake will be poor - - as he reports he does not want anything to eat and drink at this time and obliged to take intake with this SLP to allow swallow evaluation.     Suspect pt has some baseline level of dysphagia - given his drooling and wet voice prior to admission. Suspect exacerbation of this baseline deficit given his acute illness and prognosis for swallow function is guarded at best.  Thankful palliative care is being engaged for this kind pt and his family.    Palliative PA arrived and spoke to family about pt's current medical issues and their role for involvement.        HPI HPI: Patient is an 89 y.o. male with PMH: DM, a-fib, hiatal hernia, hearing loss (bilateral hearing aids). He presented to the hospital on 05/19/23 with confusion followed by sudden onset of seizure activity. He was transported to the hospital by EMS. In ER he was not protecting his airway and was intubated. He was extubated on 04/22/23 and kept NPO awaiting SLP swallow evaluation.      SLP Plan  Continue with current plan of care      Recommendations for follow up therapy are one component of a multi-disciplinary discharge planning process, led by the attending physician.  Recommendations may be updated based on patient status, additional functional criteria and insurance authorization.    Recommendations  Diet recommendations: Other(comment) (intake for comfort if po desired) Medication Administration: Other (Comment) (crushed with nectar or applesauce) Supervision: Patient able to self feed Compensations: Slow rate;Small sips/bites;Other (Comment);Clear throat intermittently (clear throat strongly if voice is gurgly) Postural Changes and/or Swallow Maneuvers: Seated upright 90 degrees;Upright 30-60  min after meal                  Oral care QID     Dysphagia, oropharyngeal phase (R13.12)     Continue with current plan of care    Rolena Infante, MS [facility] SLP Acute Rehab Services Office [phone]  Chales Abrahams  05/24/2023, 1:38 PM

## 2023-05-24 NOTE — Progress Notes (Signed)
 PROGRESS NOTE                                                                                                                                                                                                             Patient Demographics:    Andre Walker, is a 89 y.o. male, DOB - 18-Oct-1932, GNF:[id number]  Outpatient Primary MD for the patient is Joselyn Arrow, MD    LOS - 5  Admit date - 05/19/2023    Chief Complaint  Patient presents with   Altered Mental Status       Brief Narrative (HPI from H&P)   89yo male resident of SNF with hx AFib on eliquis, DM presents 3/25 with seizure. Altered this morning, somewhat confused then had sudden onset seizure activity. Had recurrent seizure for EMS during transport with L gaze. On arrival to ER was not protecting his airway and was intubated.  Per wife had admission in February after episode of ?syncope/unresponsiveness where seizure, he was admitted to the hospital under ICU care, he was intubated for airway protection, was seen by neurology and transferred to my service on 05/23/2023 on day 4 of his hospital stay.     Significant Hospital Events: Including procedures, antibiotic start and stop dates in addition to other pertinent events   CT head 3/25>> neg acute  CTA head/neck 3/25>>>Negative for large vessel occlusion.  2. Generalized intracranial artery tortuosity but no significant intracranial atherosclerosis. But positive for Bulky Extracranial Atherosclerosis with multifocal High-grade stenoses: - Severe stenosis Right ICA origin approaching a RADIOGRAPHIC STRING SIGN. - superimposed 65% stenosis mid Right CCA. - 70% stenosis proximal Left Subclavian Artery. - Moderate to Severe stenosis at Both Vertebral Artery origins. 3/26 MRI ok no seizure EEG extubated   Subjective:   Seen in bed significantly weak this morning, confused, denies any headache, is having upper  airway gurgling, coarse bilateral breath sounds.   Assessment  & Plan :   Seizure, metabolic encephalopathy.  Seen by neurology, stable MRI, EEG nonacute, placed on Keppra, per neurology continue Keppra.  No further episodes of seizure.  Mentation improving, some hospital-acquired delirium.   Respiratory failure/failure to protect airway due to #1 above- intubated for airway protection, extubated in about 24 hours, complicated by aspiration pneumonia versus ventilator associated pneumonia.  Currently on combination of Rocephin and azithromycin, continue I-S  and flutter valve, speech therapy following, became septic and hypotensive night of 05/23/2023, still having microaspiration, gurgling, extremely weak and is having difficulty protecting his oral secretions.  Chest PT, suctioning, change antibiotics to Unasyn and Zyvox.  Overall prognosis is poor, had long discussion with patient's wife bedside, continue medical treatment, hydrate with IV fluids, antibiotics, speech follow-up, n.p.o. till mentation improves.  If there is significant decline then focus on comfort, involve palliative care.    Paroxysmal AFib on eliquis - continue the patient on Eliquis, add low-dose beta-blocker for proper rate control.   Hypomagnesemia :  Replace   Bradycardia - likely from dexmedetomidine but likely dexmedetomidine making things worse, dexemedetomidine and this improved, not tachycardic.     DM 2 -  Hold metformin, SSI   Lab Results  Component Value Date   HGBA1C 6.6 (H) 04/23/2023   CBG (last 3)  Recent Labs    05/23/23 1618 05/23/23 2141 05/24/23 0746  GLUCAP 190* 149* 159*         Condition - Extremely Guarded  Family Communication  : Wife bedside on 05/23/2023, 05/24/2023  Code Status : DNR  Consults  :   Neuro, PCCM, palliative care  PUD Prophylaxis :  PPI   Procedures  :            Disposition Plan  :    Status is: Inpatient   DVT Prophylaxis  :    SCDs Start: 05/19/23  1041 apixaban (ELIQUIS) tablet 5 mg     Lab Results  Component Value Date   PLT 231 05/24/2023    Diet :  Diet Order             Diet NPO time specified Except for: Sips with Meds  Diet effective now                    Inpatient Medications  Scheduled Meds:  apixaban  5 mg Oral BID   Chlorhexidine Gluconate Cloth  6 each Topical Daily   guaiFENesin  15 mL Oral Q6H   insulin aspart  0-15 Units Subcutaneous TID PC,HS,0200   levETIRAcetam  500 mg Oral BID   metoprolol tartrate  12.5 mg Oral BID   mouth rinse  15 mL Mouth Rinse 4 times per day   pantoprazole  40 mg Oral Daily   simvastatin  20 mg Oral q1800   tamsulosin  0.4 mg Oral Daily   Continuous Infusions:  lactated ringers     vancomycin     PRN Meds:.acetaminophen, docusate sodium, levalbuterol, mouth rinse, polyethylene glycol  Antibiotics  :    Anti-infectives (From admission, onward)    Start     Dose/Rate Route Frequency Ordered Stop   05/24/23 1700  vancomycin (VANCOREADY) IVPB 500 mg/100 mL        500 mg 100 mL/hr over 60 Minutes Intravenous Every 12 hours 05/24/23 0244     05/24/23 0300  vancomycin (VANCOREADY) IVPB 2000 mg/400 mL        2,000 mg 200 mL/hr over 120 Minutes Intravenous  Once 05/24/23 0212 05/24/23 0515   05/24/23 0300  ceFEPIme (MAXIPIME) 2 g in sodium chloride 0.9 % 100 mL IVPB  Status:  Discontinued        2 g 200 mL/hr over 30 Minutes Intravenous Every 12 hours 05/24/23 0212 05/24/23 1029   05/21/23 1000  azithromycin (ZITHROMAX) 500 mg in sodium chloride 0.9 % 250 mL IVPB        500 mg  250 mL/hr over 60 Minutes Intravenous Every 24 hours 05/21/23 0914 05/23/23 1000   05/19/23 1930  cefTRIAXone (ROCEPHIN) 1 g in sodium chloride 0.9 % 100 mL IVPB        1 g 200 mL/hr over 30 Minutes Intravenous Every 24 hours 05/19/23 1826 05/23/23 1915         Objective:   Vitals:   05/24/23 0300 05/24/23 0400 05/24/23 0500 05/24/23 0600  BP: 107/65 (!) 83/55 116/67 (!) 126/95   Pulse: 74 75 81 75  Resp: (!) 22 (!) 31 (!) 25 (!) 25  Temp:  98.4 F (36.9 C)    TempSrc:  Axillary    SpO2: 96% 95% 94% 97%  Weight:      Height:        Wt Readings from Last 3 Encounters:  05/23/23 86.3 kg  05/18/23 84.4 kg  04/30/23 86.2 kg     Intake/Output Summary (Last 24 hours) at 05/24/2023 1030 Last data filed at 05/24/2023 0603 Gross per 24 hour  Intake 60 ml  Output 2425 ml  Net -2365 ml     Physical Exam  Awake Alert, No new F.N deficits, Normal affect Bliss.AT,PERRAL Supple Neck, No JVD,   Symmetrical Chest wall movement, Good air movement bilaterally, CTAB RRR,No Gallops,Rubs or new Murmurs,  +ve B.Sounds, Abd Soft, No tenderness,   No Cyanosis, Clubbing or edema        Data Review:    Recent Labs  Lab 05/19/23 0913 05/19/23 0922 05/21/23 0320 05/22/23 0349 05/23/23 0423 05/23/23 2320 05/24/23 0405  WBC 11.4*   < > 14.8* 9.9 11.0* 13.7* 14.6*  HGB 14.1   < > 13.6 12.6* 13.0 11.9* 11.5*  HCT 44.7   < > 41.1 36.3* 37.6* 34.6* 33.7*  PLT 286   < > 170 227 286 260 231  MCV 102.3*   < > 96.7 94.8 93.5 94.8 96.0  MCH 32.3   < > 32.0 32.9 32.3 32.6 32.8  MCHC 31.5   < > 33.1 34.7 34.6 34.4 34.1  RDW 12.2   < > 12.5 12.8 12.5 12.7 12.9  LYMPHSABS 3.0  --   --   --   --   --  1.9  MONOABS 0.8  --   --   --   --   --  1.5*  EOSABS 0.3  --   --   --   --   --  0.1  BASOSABS 0.0  --   --   --   --   --  0.0   < > = values in this interval not displayed.    Recent Labs  Lab 05/19/23 0913 05/19/23 1610 05/19/23 1210 05/19/23 1213 05/19/23 1424 05/19/23 1552 05/20/23 1436 05/20/23 1952 05/21/23 0426 05/22/23 0349 05/22/23 1004 05/23/23 0423 05/23/23 0821 05/23/23 2320 05/24/23 0405 05/24/23 0847  NA 137   < >  --    < >  --    < > 138  --  137 138  --  139  --  137 139  --   K 3.9   < >  --    < >  --    < > 3.9  --  4.2 3.6  --  3.6  --  4.0 4.2  --   CL 101   < >  --   --   --    < > 103  --  107 108  --  106  --  106 108  --   CO2  15*  --   --   --   --    < > 22  --  21* 23  --  26  --  22 24  --   ANIONGAP 21*  --   --   --   --    < > 13  --  9 7  --  7  --  9 7  --   GLUCOSE 226*   < >  --   --   --    < > 121*  --  113* 130*  --  119*  --  177* 182*  --   BUN 22   < >  --   --   --    < > 25*  --  21 24*  --  20  --  30* 29*  --   CREATININE 1.29*   < >  --   --   --    < > 1.17  --  1.20 0.96  --  1.10  --  1.28* 1.24  --   AST 26  --   --   --   --   --   --   --   --   --  22  --   --   --  20  --   ALT 21  --   --   --   --   --   --   --   --   --  17  --   --   --  21  --   ALKPHOS 84  --   --   --   --   --   --   --   --   --  72  --   --   --  74  --   BILITOT 1.0  --   --   --   --   --   --   --   --   --  1.0  --   --   --  1.1  --   ALBUMIN 4.0  --   --   --   --   --   --   --  3.1*  --  2.9*  --   --   --  2.7*  --   PROCALCITON  --   --   --   --   --   --   --   --   --   --   --   --   --  0.92  --   --   LATICACIDVEN  --   --  3.6*  --  3.2*  --   --  3.0*  --   --   --   --   --   --  1.3 1.1  INR 1.2  --   --   --   --   --   --   --   --   --   --   --   --   --   --   --   TSH  --   --  5.072*  --   --   --   --   --   --   --   --  8.026*  --   --  3.688  --   BNP  --   --   --   --   --   --   --   --   --   --   --   --  533.0* 454.9* 265.1*  --   MG  --   --  1.4*  --   --   --  1.6*  --  1.6*  --  1.8 1.5*  --   --  1.9  --   PHOS  --   --   --   --   --   --  3.3  --  3.3  --   --   --  2.4*  --  3.3  --   CALCIUM 9.2  --   --   --   --    < > 8.9  --  8.4* 8.4*  --  9.3  --  9.0 8.8*  --    < > = values in this interval not displayed.      Recent Labs  Lab 05/19/23 0913 05/19/23 1210 05/19/23 1424 05/19/23 1552 05/20/23 1436 05/20/23 1952 05/21/23 0426 05/22/23 0349 05/22/23 1004 05/23/23 0423 05/23/23 0821 05/23/23 2320 05/24/23 0405 05/24/23 0847  PROCALCITON  --   --   --   --   --   --   --   --   --   --   --  0.92  --   --   LATICACIDVEN  --  3.6* 3.2*  --   --   3.0*  --   --   --   --   --   --  1.3 1.1  INR 1.2  --   --   --   --   --   --   --   --   --   --   --   --   --   TSH  --  5.072*  --   --   --   --   --   --   --  8.026*  --   --  3.688  --   BNP  --   --   --   --   --   --   --   --   --   --  533.0* 454.9* 265.1*  --   MG  --  1.4*  --   --  1.6*  --  1.6*  --  1.8 1.5*  --   --  1.9  --   CALCIUM 9.2  --   --    < > 8.9  --  8.4* 8.4*  --  9.3  --  9.0 8.8*  --    < > = values in this interval not displayed.    --------------------------------------------------------------------------------------------------------------- Lab Results  Component Value Date   CHOL 101 04/23/2023   HDL 41 04/23/2023   LDLCALC 45 04/23/2023   TRIG 75 05/20/2023   CHOLHDL 2.5 04/23/2023    Lab Results  Component Value Date   HGBA1C 6.6 (H) 04/23/2023     Micro Results Recent Results (from the past 240 hours)  MRSA Next Gen by PCR, Nasal     Status: None   Collection Time: 05/19/23 11:40 AM   Specimen: Nasal Mucosa; Nasal Swab  Result Value Ref Range Status   MRSA by PCR Next Gen NOT DETECTED NOT DETECTED Final    Comment: (NOTE) The GeneXpert MRSA Assay (FDA approved for NASAL specimens only), is one component of a comprehensive MRSA colonization surveillance program. It is not intended to diagnose MRSA infection nor to guide or monitor treatment for MRSA infections. Test performance is not FDA approved in patients less than  75 years old. Performed at [facility], [address] [place], [place] [address]   Culture, blood (Routine X 2) w Reflex to ID Panel     Status: None (Preliminary result)   Collection Time: 05/21/23 10:10 AM   Specimen: BLOOD RIGHT HAND  Result Value Ref Range Status   Specimen Description BLOOD RIGHT HAND  Final   Special Requests   Final    BOTTLES DRAWN AEROBIC AND ANAEROBIC Blood Culture adequate volume   Culture   Final    NO GROWTH 3 DAYS Performed at [facility], [address],  [place], [place] [address]    Report Status PENDING  Incomplete  Culture, blood (Routine X 2) w Reflex to ID Panel     Status: None (Preliminary result)   Collection Time: 05/21/23 10:18 AM   Specimen: BLOOD LEFT ARM  Result Value Ref Range Status   Specimen Description BLOOD LEFT ARM  Final   Special Requests   Final    BOTTLES DRAWN AEROBIC AND ANAEROBIC Blood Culture adequate volume   Culture   Final    NO GROWTH 3 DAYS Performed at [facility], [address], [place], [place] [address]    Report Status PENDING  Incomplete  Culture, blood (Routine X 2) w Reflex to ID Panel     Status: None (Preliminary result)   Collection Time: 05/23/23 11:20 PM   Specimen: BLOOD RIGHT HAND  Result Value Ref Range Status   Specimen Description BLOOD RIGHT HAND  Final   Special Requests   Final    BOTTLES DRAWN AEROBIC AND ANAEROBIC Blood Culture adequate volume   Culture   Final    NO GROWTH < 12 HOURS Performed at [facility], [address], [place], [place] [address]    Report Status PENDING  Incomplete  Culture, blood (Routine X 2) w Reflex to ID Panel     Status: None (Preliminary result)   Collection Time: 05/23/23 11:22 PM   Specimen: BLOOD RIGHT ARM  Result Value Ref Range Status   Specimen Description BLOOD RIGHT ARM  Final   Special Requests   Final    BOTTLES DRAWN AEROBIC AND ANAEROBIC Blood Culture adequate volume   Culture   Final    NO GROWTH < 12 HOURS Performed at [facility], [address], [place], [place] [address]    Report Status PENDING  Incomplete  Respiratory (~20 pathogens) panel by PCR     Status: None   Collection Time: 05/23/23 11:50 PM   Specimen: Nasopharyngeal Swab; Respiratory  Result Value Ref Range Status   Adenovirus NOT DETECTED NOT DETECTED Final   Coronavirus 229E NOT DETECTED NOT DETECTED Final    Comment: (NOTE) The Coronavirus on the Respiratory Panel, DOES NOT test for the novel  Coronavirus (2019 nCoV)    Coronavirus  HKU1 NOT DETECTED NOT DETECTED Final   Coronavirus NL63 NOT DETECTED NOT DETECTED Final   Coronavirus OC43 NOT DETECTED NOT DETECTED Final   Metapneumovirus NOT DETECTED NOT DETECTED Final   Rhinovirus / Enterovirus NOT DETECTED NOT DETECTED Final   Influenza A NOT DETECTED NOT DETECTED Final   Influenza B NOT DETECTED NOT DETECTED Final   Parainfluenza Virus 1 NOT DETECTED NOT DETECTED Final   Parainfluenza Virus 2 NOT DETECTED NOT DETECTED Final   Parainfluenza Virus 3 NOT DETECTED NOT DETECTED Final   Parainfluenza Virus 4 NOT DETECTED NOT DETECTED Final   Respiratory Syncytial Virus  NOT DETECTED NOT DETECTED Final   Bordetella pertussis NOT DETECTED NOT DETECTED Final   Bordetella Parapertussis NOT DETECTED NOT DETECTED Final   Chlamydophila pneumoniae NOT DETECTED NOT DETECTED Final   Mycoplasma pneumoniae NOT DETECTED NOT DETECTED Final    Comment: Performed at [facility], [address] [place], [place] [address]    Radiology Report DG CHEST PORT 1 VIEW Result Date: 05/23/2023 CLINICAL DATA:  Pneumonia EXAM: PORTABLE CHEST 1 VIEW COMPARISON:  None Available. FINDINGS: Lungs are well expanded, symmetric, and clear. No pneumothorax or pleural effusion. Cardiac size within normal limits. Pulmonary vascularity is normal. Osseous structures are age-appropriate. No acute bone abnormality. IMPRESSION: No active disease. Electronically Signed   By: Helyn Numbers M.D.   On: 05/23/2023 23:32   DG Chest Port 1 View Result Date: 05/23/2023 CLINICAL DATA:  Shortness of breath. Atrial fibrillation EXAM: PORTABLE CHEST 1 VIEW COMPARISON:  05/19/2023. FINDINGS: Heart size appears stable. Aortic atherosclerosis. Interval removal of ET tube and enteric tube. Lung volumes are low. Pulmonary vascular congestion noted. Atelectatic changes are again seen in the lung bases. IMPRESSION: 1. Low lung volumes and bibasilar atelectasis. 2. Pulmonary vascular congestion. Electronically Signed   By:  Signa Kell M.D.   On: 05/23/2023 07:59     Signature  -   Susa Raring M.D on 05/24/2023 at 10:30 AM   -  To page go to [email or web address]

## 2023-05-24 NOTE — Progress Notes (Addendum)
  RN reported that patient blood pressure was 83/55 however second reading showing  116/67.  Lactic acid 1.2 within normal range. -Increasing the maintenance fluid rate LR 75 cc to 125 cc/h. - Respiratory panel negative.  UA showed evidence of UTI.  Lactic acid, blood culture, urine culture is in process. -Continue broad-spectrum coverage with IV Vanco and cefepime until blood culture and urine culture results will be available.  Tereasa Coop, MD [facility] 05/24/2023, 5:09 AM

## 2023-05-24 NOTE — Progress Notes (Signed)
 Cough assist utilized due to worry of aspiration this morning. RT perform cough assist on Pt and was able to cough out mod/ pink tinge sputum. After therapy upper airway sounds clear. No indication for NTS at this time.Pt is currently on 4L and is VSS at this time.

## 2023-05-24 NOTE — Progress Notes (Signed)
 Pharmacy Antibiotic Note  Andre Walker is a 89 y.o. male admitted on 05/19/2023 with altered mental status now with concerns for sepsis of unknown source. Pharmacy has been consulted for cefepime/vancomycin dosing.  Plan: -Cefepime 2g IV every 12 hours -Vancomycin 2g IV x1 -Vancomycin 500mg  IV every 12 hours (AUC 425, Vd 0.72, IBW) -Monitor renal function -Follow up signs of clinical improvement, LOT, de-escalation of antibiotics   Height: 5\' 10"  (177.8 cm) Weight: 86.3 kg (190 lb 4.1 oz) IBW/kg (Calculated) : 73  Temp (24hrs), Avg:99.2 F (37.3 C), Min:97.6 F (36.4 C), Max:100.8 F (38.2 C)  Recent Labs  Lab 05/19/23 1210 05/19/23 1424 05/19/23 1552 05/20/23 1436 05/20/23 1952 05/21/23 0320 05/21/23 0426 05/22/23 0349 05/23/23 0423 05/23/23 2320  WBC  --   --   --  14.9*  --  14.8*  --  9.9 11.0* 13.7*  CREATININE  --   --    < > 1.17  --   --  1.20 0.96 1.10 1.28*  LATICACIDVEN 3.6* 3.2*  --   --  3.0*  --   --   --   --   --    < > = values in this interval not displayed.    Estimated Creatinine Clearance: 40.4 mL/min (A) (by C-G formula based on SCr of 1.28 mg/dL (H)).    Allergies  Allergen Reactions   Penicillins Rash    Antimicrobials this admission: Cefepime 3/30 >>  Vancomycin 3/30 >>   Microbiology results: 3/29 BCx:  3/27 Bcx: ngtd /27 MRSA PCR:   Thank you for allowing pharmacy to be a part of this patient's care.  Arabella Merles, PharmD. Clinical Pharmacist 05/24/2023 2:20 AM

## 2023-05-24 NOTE — Progress Notes (Addendum)
  Concern for sepsis unknown source of infection Patient's RN reported that patient is diaphoretic with elevated temperature 100.5 F.  Patient has persistent fever since last night and currently blood pressure has been trending down to 83/59 MAP 64.  -Checking stat lactic acid level. - Giving 1 L of LR bolus and followed by continue maintenance fluid LR 100 cc/h. - CBC showing worsening leukocytosis 13.7 - Patient has been recently treated for aspiration pneumonia completed 5 days course - Pending respiratory panel, repeat blood culture, procalcitonin level - Pending sputum culture - Repeated chest x-ray no evidence of active disease. -Checking UA and urine culture. - At this point unknown source of the infection however patient meets  SIRS criteria. - Will start broad-spectrum antibiotic coverage with IV Vanco and cefepime.

## 2023-05-24 NOTE — Progress Notes (Signed)
 Pt being followed by [facility] for Sepsis protocol.

## 2023-05-24 NOTE — Progress Notes (Signed)
 Pharmacy Antibiotic Note  Andre Walker is a 89 y.o. male admitted on 05/19/2023 with altered mental status now with concerns for aspiration pneumonia. Pharmacy has been consulted for Unasyn dosing.  Patient's Tmax is 100.8, WBC is 15, and renal function okay (CrCl 41.7 mL/min).   Plan: Begin Unasyn 1.5 g IV q6h  Begin linezolid 600 mg PO q12h  -Monitor renal function -Follow up signs of clinical improvement and de-escalation of antibiotics   Height: 5\' 10"  (177.8 cm) Weight: 86.3 kg (190 lb 4.1 oz) IBW/kg (Calculated) : 73  Temp (24hrs), Avg:99.4 F (37.4 C), Min:98.4 F (36.9 C), Max:100.8 F (38.2 C)  Recent Labs  Lab 05/19/23 1210 05/19/23 1424 05/19/23 1552 05/20/23 1952 05/21/23 0320 05/21/23 0426 05/22/23 0349 05/23/23 0423 05/23/23 2320 05/24/23 0405 05/24/23 0847  WBC  --   --    < >  --  14.8*  --  9.9 11.0* 13.7* 14.6*  --   CREATININE  --   --    < >  --   --  1.20 0.96 1.10 1.28* 1.24  --   LATICACIDVEN 3.6* 3.2*  --  3.0*  --   --   --   --   --  1.3 1.1   < > = values in this interval not displayed.    Estimated Creatinine Clearance: 41.7 mL/min (by C-G formula based on SCr of 1.24 mg/dL).    Allergies  Allergen Reactions   Penicillins Rash    Antimicrobials this admission: CTX 3/25 >> 3/29 Aizth 3/27 >> 3/29 Vanc 3/30  Unasyn 3/30 >> (4/3) Linezolid 3/30 >> (4/3)  Microbiology results: 3/25 MRSA PCR - negative 3/27 BCx - NG 3/29 BCx - NG  Thank you for allowing pharmacy to be a part of this patient's care.  Roslyn Smiling, PharmD PGY1 Pharmacy Resident 05/24/2023 10:42 AM

## 2023-05-25 ENCOUNTER — Inpatient Hospital Stay (HOSPITAL_COMMUNITY)

## 2023-05-25 DIAGNOSIS — R569 Unspecified convulsions: Secondary | ICD-10-CM | POA: Diagnosis not present

## 2023-05-25 DIAGNOSIS — R4182 Altered mental status, unspecified: Secondary | ICD-10-CM | POA: Diagnosis not present

## 2023-05-25 DIAGNOSIS — Z515 Encounter for palliative care: Secondary | ICD-10-CM | POA: Diagnosis not present

## 2023-05-25 DIAGNOSIS — Z7189 Other specified counseling: Secondary | ICD-10-CM | POA: Diagnosis not present

## 2023-05-25 LAB — COMPREHENSIVE METABOLIC PANEL WITH GFR
ALT: 25 U/L (ref 0–44)
AST: 28 U/L (ref 15–41)
Albumin: 2.8 g/dL — ABNORMAL LOW (ref 3.5–5.0)
Alkaline Phosphatase: 88 U/L (ref 38–126)
Anion gap: 10 (ref 5–15)
BUN: 25 mg/dL — ABNORMAL HIGH (ref 8–23)
CO2: 25 mmol/L (ref 22–32)
Calcium: 8.8 mg/dL — ABNORMAL LOW (ref 8.9–10.3)
Chloride: 106 mmol/L (ref 98–111)
Creatinine, Ser: 1.06 mg/dL (ref 0.61–1.24)
GFR, Estimated: >60 mL/min (ref >60)
Glucose, Bld: 156 mg/dL — ABNORMAL HIGH (ref 70–99)
Potassium: 4 mmol/L (ref 3.5–5.1)
Sodium: 141 mmol/L (ref 135–145)
Total Bilirubin: 1.3 mg/dL — ABNORMAL HIGH (ref 0.0–1.2)
Total Protein: 6 g/dL — ABNORMAL LOW (ref 6.5–8.1)

## 2023-05-25 LAB — CBC WITH DIFFERENTIAL/PLATELET
Abs Immature Granulocytes: 0.17 10*3/uL — ABNORMAL HIGH (ref 0.00–0.07)
Basophils Absolute: 0 10*3/uL (ref 0.0–0.1)
Basophils Relative: 0 %
Eosinophils Absolute: 0.4 10*3/uL (ref 0.0–0.5)
Eosinophils Relative: 2 %
HCT: 34.1 % — ABNORMAL LOW (ref 39.0–52.0)
Hemoglobin: 11.5 g/dL — ABNORMAL LOW (ref 13.0–17.0)
Immature Granulocytes: 1 %
Lymphocytes Relative: 12 %
Lymphs Abs: 1.7 10*3/uL (ref 0.7–4.0)
MCH: 32.9 pg (ref 26.0–34.0)
MCHC: 33.7 g/dL (ref 30.0–36.0)
MCV: 97.4 fL (ref 80.0–100.0)
Monocytes Absolute: 1.5 10*3/uL — ABNORMAL HIGH (ref 0.1–1.0)
Monocytes Relative: 10 %
Neutro Abs: 10.9 10*3/uL — ABNORMAL HIGH (ref 1.7–7.7)
Neutrophils Relative %: 75 %
Platelets: 268 10*3/uL (ref 150–400)
RBC: 3.5 MIL/uL — ABNORMAL LOW (ref 4.22–5.81)
RDW: 12.7 % (ref 11.5–15.5)
WBC: 14.6 10*3/uL — ABNORMAL HIGH (ref 4.0–10.5)
nRBC: 0 % (ref 0.0–0.2)

## 2023-05-25 LAB — URINE CULTURE: Culture: NO GROWTH

## 2023-05-25 LAB — MAGNESIUM: Magnesium: 1.4 mg/dL — ABNORMAL LOW (ref 1.7–2.4)

## 2023-05-25 LAB — GLUCOSE, CAPILLARY
Glucose-Capillary: 146 mg/dL — ABNORMAL HIGH (ref 70–99)
Glucose-Capillary: 122 mg/dL — ABNORMAL HIGH (ref 70–99)
Glucose-Capillary: 117 mg/dL — ABNORMAL HIGH (ref 70–99)
Glucose-Capillary: 137 mg/dL — ABNORMAL HIGH (ref 70–99)

## 2023-05-25 LAB — PHOSPHORUS: Phosphorus: 3.8 mg/dL (ref 2.5–4.6)

## 2023-05-25 LAB — C-REACTIVE PROTEIN: CRP: 19.8 mg/dL — ABNORMAL HIGH (ref <1.0)

## 2023-05-25 LAB — PROCALCITONIN: Procalcitonin: 0.37 ng/mL

## 2023-05-25 MED ORDER — MAGNESIUM SULFATE 4 GM/100ML IV SOLN
4.0000 g | Freq: Once | INTRAVENOUS | Status: AC
  Administered 2023-05-25: 4 g via INTRAVENOUS
  Filled 2023-05-25: qty 100

## 2023-05-25 MED ORDER — LACTATED RINGERS IV SOLN: INTRAVENOUS | Status: DC

## 2023-05-25 MED ORDER — METOPROLOL TARTRATE 5 MG/5ML IV SOLN: 5.0000 mg | Freq: Three times a day (TID) | INTRAVENOUS | Status: DC | PRN

## 2023-05-25 MED ORDER — ACETAMINOPHEN 650 MG RE SUPP
650.0000 mg | RECTAL | Status: DC | PRN
  Administered 2023-05-25 (×2): 650 mg via RECTAL
  Filled 2023-05-25 (×2): qty 1

## 2023-05-25 NOTE — Progress Notes (Signed)
 PT Cancellation Note  Patient Details Name: Andre Walker MRN: [id number] DOB: September 18, 1932   Cancelled Treatment:    Reason Eval/Treat Not Completed: Fatigue/lethargy limiting ability to participate (Spoke with nurse. Patient is lethargic, will defer PT. Palliative discussions ongoing with family meeting planned for tomorrow. PT will follow up pending patient/family goals.)  Donna Bernard, PT, MPT  [name] 05/25/2023, 2:15 PM

## 2023-05-25 NOTE — Plan of Care (Signed)
  Problem: Coping: Goal: Ability to adjust to condition or change in health will improve Outcome: Progressing   Problem: Skin Integrity: Goal: Risk for impaired skin integrity will decrease Outcome: Progressing   Problem: Education: Goal: Knowledge of General Education information will improve Description: Including pain rating scale, medication(s)/side effects and non-pharmacologic comfort measures Outcome: Progressing   Problem: Clinical Measurements: Goal: Cardiovascular complication will be avoided Outcome: Progressing   Problem: Coping: Goal: Level of anxiety will decrease Outcome: Progressing   Problem: Elimination: Goal: Will not experience complications related to bowel motility Outcome: Progressing Goal: Will not experience complications related to urinary retention Outcome: Progressing   Problem: Pain Managment: Goal: General experience of comfort will improve and/or be controlled Outcome: Progressing   Problem: Safety: Goal: Ability to remain free from injury will improve Outcome: Progressing

## 2023-05-25 NOTE — Plan of Care (Signed)

## 2023-05-25 NOTE — Progress Notes (Addendum)
 Speech Language Pathology Treatment: Dysphagia  Patient Details Name: Andre Walker MRN: [id number] DOB: [birth year] Today's Date: 05/25/2023 Time: 4098-1191 SLP Time Calculation (min) (ACUTE ONLY): 15 min  Assessment / Plan / Recommendation Clinical Impression  Pt appears significantly weaker than session on 3/28 with concern of aspiration over weekend, SLP reconsult and is now NPO allowing floor stock nectar thick for comfort. Pt encountered asleep with wife at bedside, open mouth breathing posture and wet/gurgly sounding respirations. He was able to wake and tolerate oral care and able to cough with SLP placing [place] far in posterior oral cavity to suction mucous with respirations appeared to mitigate wet/gurgly sounds. Pt stated he would like to have nectar thickened juice administered via spoon. His swallow appeared mostly timely and appreciated laryngeal elevation for swallow- there was no coughing, throat clearing present with limited trials however explained to wife that his aspiration risk is high and may likely be aspirating his secretions and/or nectar. Wife stated she understood and would like to continue to allow him to have nectar for pleasure. Discussed that Palliative care is following and wife stated she and her son have not made decision but she voiced leaning toward comfort care for pt and she will discuss further with Palliative. ST will follow briefly for further education, if family continues for want comfort feeds and decision if pt will be comfort care.    HPI HPI: Patient is an 89 y.o. male with PMH: DM, a-fib, hiatal hernia, hearing loss (bilateral hearing aids). He presented to the hospital on 05/19/23 with confusion followed by sudden onset of seizure activity. He was transported to the hospital by EMS. In ER he was not protecting his airway and was intubated. He was extubated on 04/22/23 and kept NPO awaiting SLP swallow evaluation. MBS 3/27 recommending Dys 2/ nectar. Concern  for aspiration over weekend (3/28-2/29) and SLP reconsulted recommending NPO with comfort feeds floor stock of nectar thick after oral care.      SLP Plan  Continue with current plan of care      Recommendations for follow up therapy are one component of a multi-disciplinary discharge planning process, led by the attending physician.  Recommendations may be updated based on patient status, additional functional criteria and insurance authorization.    Recommendations  Diet recommendations:  (floor stock of nectar for comfort) Liquids provided via: Teaspoon Medication Administration: Crushed with puree Supervision: Staff to assist with self feeding Compensations: Slow rate;Small sips/bites;Other (Comment);Clear throat intermittently Postural Changes and/or Swallow Maneuvers: Seated upright 90 degrees;Upright 30-60 min after meal                  Oral care QID   Frequent or constant Supervision/Assistance Dysphagia, oropharyngeal phase (R13.12)     Continue with current plan of care     [name]  05/25/2023, 11:03 AM

## 2023-05-25 NOTE — Progress Notes (Addendum)
 Daily Progress Note   Patient Name: Andre Walker       Date: 05/25/2023 DOB: Jun 29, 1932  Age: 89 y.o. MRN#: [id number] Attending Physician: Leroy Sea, MD Primary Care Physician: Joselyn Arrow, MD Admit Date: 05/19/2023  Reason for Consultation/Follow-up: Establishing goals of care  Subjective: Medical records reviewed including progress notes, labs and imaging. Patient assessed at the bedside. He is lethargic with copious secretions noted. His wife and wife's best friend are present visiting. Discussed with RN.  Created space and opportunity for patient and family's thoughts and feelings on his current illness. Patient tried to speak but grew fatigued and was difficult to understand. Family recognizes after discussing with primary team that we may be at a point tomorrow where it is time to decide on comfort care if today's interventions do not help or if he declines further. Wife would want her son to be present during this conversation to help her process and he has thus requested tomorrow off work to be present at the bedside. They are interested in speaking with palliative care team again after morning update from the attending for further GOC discussions.  I presented the option of adding on comfort medications such as Robinul for excessive secretions while continuing with medical management for another today. Wife prefers to defer until tomorrow and review this option with her son present.    Emotional support and therapeutic listening was provided. Questions and concerns addressed. PMT will continue to support holistically.   Length of Stay: 6   Physical Exam Vitals and nursing note reviewed.  Constitutional:      General: He is not in acute distress.    Appearance: He is ill-appearing.     Interventions: Nasal cannula in place.   Cardiovascular:     Rate and Rhythm: Normal rate.  Pulmonary:     Effort: Tachypnea present. No respiratory distress.  Skin:    General: Skin is cool and dry.  Neurological:     Mental Status: He is lethargic.  Psychiatric:        Cognition and Memory: Cognition is impaired.             Vital Signs: BP 131/68   Pulse 73   Temp 99.7 F (37.6 C) (Axillary)   Resp (!) 25   Ht 5\' 10"  (1.778 m)   Wt 86.3 kg   SpO2 96%   BMI 27.30 kg/m  SpO2: SpO2: 96 % O2 Device: O2 Device: Nasal Cannula O2 Flow Rate: O2 Flow Rate (L/min): 4 L/min      Palliative Assessment/Data: 10-20%   Palliative Care Assessment &  Plan   Patient Profile: 89 y.o. male with past medical history of AFib on eliquis, DM  admitted on 05/19/2023 with confusion and seizure activity at home/ILF.    Had recurrent seizure for EMS during transport with L gaze. On arrival to ER was not protecting his airway and was intubated. Had recent admission in February for syncope and unresponsiveness without noted seizure on EEG. During this admission, he was seen by neurology and hospitalization was further complicated by pneumonia.    PMT has been consulted to assist with goals of care conversation in the setting of recurrent aspiration and rapid decline.  Assessment: Goals of care conversation Seizure Acute metabolic encephalopathy  Acute respiratory failure s/p extubation, complicated by further aspiration PNA vs VAP  Recommendations/Plan: Continue DNR/DNI Continue current care for another 24 hours, likely transition to comfort care after family meeting with wife and son if patient fails to improve No feeding tube, allow comfort feeds (nectar thick) from floor stock Psychosocial and emotional support provided PMT will continue to follow and support   Addendum: returned to the bedside and clarified whether family was interested in family meeting tomorrow 4/1. She confirmed that she has asked both of her sons to be  present and would like to meet with PMT around 9am. Will replace consult accordingly.  Prognosis: Poor  Discharge Planning: To Be Determined  Care plan was discussed with patient, patient's wife and wife's best friend   MDM High         Radford Pease Jeni Salles, PA-C  Palliative Medicine Team Team phone # [phone]  Thank you for allowing the Palliative Medicine Team to assist in the care of this patient. Please utilize secure chat with additional questions, if there is no response within 30 minutes please call the above phone number.  Palliative Medicine Team providers are available by phone from 7am to 7pm daily and can be reached through the team cell phone.  Should this patient require assistance outside of these hours, please call the patient's attending physician.

## 2023-05-25 NOTE — Progress Notes (Signed)
  Unable to give the p.o. meds Eliquis and metoprolol tonight as patient is aspirating and currently NPO. Current the patient is DNR and ongoing family discussion possible transition to comfort care.  Tereasa Coop, MD [facility] 05/25/2023, 1:56 AM

## 2023-05-25 NOTE — Care Management Important Message (Signed)
 Important Message  Patient Details  Name: Andre Walker MRN: [id number] Date of Birth: 24-Jul-1932   Important Message Given:  Yes - Medicare IM     Dorena Bodo 05/25/2023, 4:27 PM

## 2023-05-25 NOTE — Progress Notes (Signed)
 This chaplain responded to the medical team consult for spouse meals.  This chaplain is not aware of a meal plan sponsored by [employer].   The chaplain brought the request to the unit charge RN-Lulu. This chaplain understands the Charge RN will F/U with providing meals for the Pt. spouse either through the cafeteria or social work.  This chaplain is available for F/U spiritual care as needed.  Chaplain Stephanie Acre [phone]

## 2023-05-25 NOTE — Progress Notes (Addendum)
 PROGRESS NOTE                                                                                                                                                                                                             Patient Demographics:    Andre Walker, is a 89 y.o. male, DOB - [birth year], ZOX:[id number]  Outpatient Primary MD for the patient is Joselyn Arrow, MD    LOS - 6  Admit date - 05/19/2023    Chief Complaint  Patient presents with   Altered Mental Status       Brief Narrative (HPI from H&P)   89yo male resident of SNF with hx AFib on eliquis, DM presents 3/25 with seizure. Altered this morning, somewhat confused then had sudden onset seizure activity. Had recurrent seizure for EMS during transport with L gaze. On arrival to ER was not protecting his airway and was intubated.  Per wife had admission in February after episode of ?syncope/unresponsiveness where seizure, he was admitted to the hospital under ICU care, he was intubated for airway protection, was seen by neurology and transferred to my service on 05/23/2023 on day 4 of his hospital stay.     Significant Hospital Events: Including procedures, antibiotic start and stop dates in addition to other pertinent events   CT head 3/25>> neg acute  CTA head/neck 3/25>>>Negative for large vessel occlusion.  2. Generalized intracranial artery tortuosity but no significant intracranial atherosclerosis. But positive for Bulky Extracranial Atherosclerosis with multifocal High-grade stenoses: - Severe stenosis Right ICA origin approaching a RADIOGRAPHIC STRING SIGN. - superimposed 65% stenosis mid Right CCA. - 70% stenosis proximal Left Subclavian Artery. - Moderate to Severe stenosis at Both Vertebral Artery origins. 3/26 MRI ok no seizure EEG extubated   Subjective:   Seen in bed significantly weak this morning, confused, denies any headache, is having upper  airway gurgling, coarse bilateral breath sounds.   Assessment  & Plan :   Seizure, metabolic encephalopathy.  Seen by neurology, stable MRI, EEG nonacute, placed on Keppra, per neurology continue Keppra.  No further episodes of seizure.  Mentation improving, some hospital-acquired delirium.   Respiratory failure/failure to protect airway due to #1 above- intubated for airway protection, extubated in about 24 hours, complicated by aspiration pneumonia versus ventilator associated pneumonia.  Currently on combination of Rocephin and azithromycin, continue I-S  and flutter valve, speech therapy following, became septic and hypotensive night of 05/23/2023, still having microaspiration, gurgling, extremely weak and is having difficulty protecting his oral secretions.  Chest PT, suctioning, change antibiotics to Unasyn and Zyvox.  Overall prognosis is poor, had long discussion with patient's wife bedside, continue medical treatment, hydrate with IV fluids, antibiotics, speech follow-up, n.p.o. till mentation improves.  If there is significant decline then focus on comfort, involve palliative care.    Paroxysmal AFib on eliquis - continue the patient on Eliquis, add low-dose beta-blocker for proper rate control.   Hypomagnesemia :  Replace   Bradycardia - likely from dexmedetomidine but likely dexmedetomidine making things worse, dexemedetomidine and this improved, not tachycardic.     Ur. Retention - night of 05/23/23 - foley + flomax.  DM 2 -  Hold metformin, SSI   Lab Results  Component Value Date   HGBA1C 6.6 (H) 04/23/2023   CBG (last 3)  Recent Labs    05/24/23 1528 05/24/23 2118 05/25/23 0745  GLUCAP 169* 145* 146*         Condition - Extremely Guarded  Family Communication  :   Wife bedside on 05/23/2023, 05/24/2023 05/25/2023 Called son Marina Goodell [phone] on 05/25/2023 at 10:30 AM - message left, updated in detail at 2pm,    Code Status : DNR  Consults  :   Neuro, PCCM,  palliative care  PUD Prophylaxis :  PPI   Procedures  :            Disposition Plan  :    Status is: Inpatient   DVT Prophylaxis  :    SCDs Start: 05/19/23 1041 apixaban (ELIQUIS) tablet 5 mg     Lab Results  Component Value Date   PLT 268 05/25/2023    Diet :  Diet Order             Diet NPO time specified Except for: Sips with Meds  Diet effective now                    Inpatient Medications  Scheduled Meds:  apixaban  5 mg Oral BID   Chlorhexidine Gluconate Cloth  6 each Topical Daily   guaiFENesin  15 mL Oral Q6H   insulin aspart  0-15 Units Subcutaneous TID PC,HS,0200   levETIRAcetam  500 mg Oral BID   linezolid  600 mg Oral Q12H   metoprolol tartrate  12.5 mg Oral BID   mouth rinse  15 mL Mouth Rinse 4 times per day   pantoprazole  40 mg Oral Daily   scopolamine  1 patch Transdermal Q72H   simvastatin  20 mg Oral q1800   tamsulosin  0.4 mg Oral Daily   Continuous Infusions:  ampicillin-sulbactam (UNASYN) IV 1.5 g (05/25/23 0540)   PRN Meds:.acetaminophen, docusate sodium, levalbuterol, mouth rinse, polyethylene glycol  Antibiotics  :    Anti-infectives (From admission, onward)    Start     Dose/Rate Route Frequency Ordered Stop   05/24/23 1700  vancomycin (VANCOREADY) IVPB 500 mg/100 mL  Status:  Discontinued        500 mg 100 mL/hr over 60 Minutes Intravenous Every 12 hours 05/24/23 0244 05/24/23 1040   05/24/23 1130  linezolid (ZYVOX) tablet 600 mg        600 mg Oral Every 12 hours 05/24/23 1040 05/28/23 2359   05/24/23 1130  ampicillin-sulbactam (UNASYN) 1.5 g in sodium chloride 0.9 % 100 mL IVPB  1.5 g 200 mL/hr over 30 Minutes Intravenous Every 6 hours 05/24/23 1040 05/28/23 2359   05/24/23 0300  vancomycin (VANCOREADY) IVPB 2000 mg/400 mL        2,000 mg 200 mL/hr over 120 Minutes Intravenous  Once 05/24/23 0212 05/24/23 1117   05/24/23 0300  ceFEPIme (MAXIPIME) 2 g in sodium chloride 0.9 % 100 mL IVPB  Status:   Discontinued        2 g 200 mL/hr over 30 Minutes Intravenous Every 12 hours 05/24/23 0212 05/24/23 1029   05/21/23 1000  azithromycin (ZITHROMAX) 500 mg in sodium chloride 0.9 % 250 mL IVPB        500 mg 250 mL/hr over 60 Minutes Intravenous Every 24 hours 05/21/23 0914 05/23/23 1000   05/19/23 1930  cefTRIAXone (ROCEPHIN) 1 g in sodium chloride 0.9 % 100 mL IVPB        1 g 200 mL/hr over 30 Minutes Intravenous Every 24 hours 05/19/23 1826 05/23/23 1915         Objective:   Vitals:   05/25/23 0400 05/25/23 0658 05/25/23 0747 05/25/23 0937  BP: (!) 162/81  131/68   Pulse: 72  72 73  Resp: (!) 21  (!) 23 (!) 25  Temp:  (!) 102.1 F (38.9 C) (!) 101.5 F (38.6 C) 99.7 F (37.6 C)  TempSrc:  Axillary  Axillary  SpO2:    96%  Weight:      Height:        Wt Readings from Last 3 Encounters:  05/23/23 86.3 kg  05/18/23 84.4 kg  04/30/23 86.2 kg     Intake/Output Summary (Last 24 hours) at 05/25/2023 1029 Last data filed at 05/25/2023 1610 Gross per 24 hour  Intake 3077.79 ml  Output 1900 ml  Net 1177.79 ml     Physical Exam  Awake Alert, No new F.N deficits, Normal affect .AT,PERRAL Supple Neck, No JVD,   Symmetrical Chest wall movement, Good air movement bilaterally, CTAB RRR,No Gallops,Rubs or new Murmurs,  +ve B.Sounds, Abd Soft, No tenderness,   No Cyanosis, Clubbing or edema        Data Review:    Recent Labs  Lab 05/19/23 0913 05/19/23 0922 05/22/23 0349 05/23/23 0423 05/23/23 2320 05/24/23 0405 05/25/23 0428  WBC 11.4*   < > 9.9 11.0* 13.7* 14.6* 14.6*  HGB 14.1   < > 12.6* 13.0 11.9* 11.5* 11.5*  HCT 44.7   < > 36.3* 37.6* 34.6* 33.7* 34.1*  PLT 286   < > 227 286 260 231 268  MCV 102.3*   < > 94.8 93.5 94.8 96.0 97.4  MCH 32.3   < > 32.9 32.3 32.6 32.8 32.9  MCHC 31.5   < > 34.7 34.6 34.4 34.1 33.7  RDW 12.2   < > 12.8 12.5 12.7 12.9 12.7  LYMPHSABS 3.0  --   --   --   --  1.9 1.7  MONOABS 0.8  --   --   --   --  1.5* 1.5*  EOSABS 0.3   --   --   --   --  0.1 0.4  BASOSABS 0.0  --   --   --   --  0.0 0.0   < > = values in this interval not displayed.    Recent Labs  Lab 05/19/23 9604 05/19/23 5409 05/19/23 1210 05/19/23 1213 05/19/23 1424 05/19/23 1552 05/20/23 1436 05/20/23 1952 05/21/23 0426 05/22/23 8119 05/22/23 1004 05/23/23 0423 05/23/23 1478 05/23/23 2320  05/24/23 0405 05/24/23 0847 05/25/23 0428  NA 137   < >  --    < >  --    < > 138  --  137 138  --  139  --  137 139  --  141  K 3.9   < >  --    < >  --    < > 3.9  --  4.2 3.6  --  3.6  --  4.0 4.2  --  4.0  CL 101   < >  --   --   --    < > 103  --  107 108  --  106  --  106 108  --  106  CO2 15*  --   --   --   --    < > 22  --  21* 23  --  26  --  22 24  --  25  ANIONGAP 21*  --   --   --   --    < > 13  --  9 7  --  7  --  9 7  --  10  GLUCOSE 226*   < >  --   --   --    < > 121*  --  113* 130*  --  119*  --  177* 182*  --  156*  BUN 22   < >  --   --   --    < > 25*  --  21 24*  --  20  --  30* 29*  --  25*  CREATININE 1.29*   < >  --   --   --    < > 1.17  --  1.20 0.96  --  1.10  --  1.28* 1.24  --  1.06  AST 26  --   --   --   --   --   --   --   --   --  22  --   --   --  20  --  28  ALT 21  --   --   --   --   --   --   --   --   --  17  --   --   --  21  --  25  ALKPHOS 84  --   --   --   --   --   --   --   --   --  72  --   --   --  74  --  88  BILITOT 1.0  --   --   --   --   --   --   --   --   --  1.0  --   --   --  1.1  --  1.3*  ALBUMIN 4.0  --   --   --   --   --   --   --  3.1*  --  2.9*  --   --   --  2.7*  --  2.8*  CRP  --   --   --   --   --   --   --   --   --   --   --   --   --   --  20.9*  --  19.8*  PROCALCITON  --   --   --   --   --   --   --   --   --   --   --   --   --  0.92  --   --  0.37  LATICACIDVEN  --   --  3.6*  --  3.2*  --   --  3.0*  --   --   --   --   --   --  1.3 1.1  --   INR 1.2  --   --   --   --   --   --   --   --   --   --   --   --   --   --   --   --   TSH  --   --  5.072*  --   --   --   --    --   --   --   --  8.026*  --   --  3.688  --   --   BNP  --   --   --   --   --   --   --   --   --   --   --   --  533.0* 454.9* 265.1*  --   --   MG  --   --  1.4*  --   --   --  1.6*  --  1.6*  --  1.8 1.5*  --   --  1.9  --  1.4*  PHOS  --   --   --   --   --   --  3.3  --  3.3  --   --   --  2.4*  --  3.3  --  3.8  CALCIUM 9.2  --   --   --   --    < > 8.9  --  8.4* 8.4*  --  9.3  --  9.0 8.8*  --  8.8*   < > = values in this interval not displayed.      Recent Labs  Lab 05/19/23 0913 05/19/23 1210 05/19/23 1424 05/19/23 1552 05/20/23 1952 05/21/23 0426 05/22/23 0349 05/22/23 1004 05/23/23 0423 05/23/23 0821 05/23/23 2320 05/24/23 0405 05/24/23 0847 05/25/23 0428  CRP  --   --   --   --   --   --   --   --   --   --   --  20.9*  --  19.8*  PROCALCITON  --   --   --   --   --   --   --   --   --   --  0.92  --   --  0.37  LATICACIDVEN  --  3.6* 3.2*  --  3.0*  --   --   --   --   --   --  1.3 1.1  --   INR 1.2  --   --   --   --   --   --   --   --   --   --   --   --   --   TSH  --  5.072*  --   --   --   --   --   --  8.026*  --   --  3.688  --   --   BNP  --   --   --   --   --   --   --   --   --  533.0* 454.9* 265.1*  --   --   MG  --  1.4*  --    < >  --  1.6*  --  1.8 1.5*  --   --  1.9  --  1.4*  CALCIUM 9.2  --   --    < >  --  8.4* 8.4*  --  9.3  --  9.0 8.8*  --  8.8*   < > = values in this interval not displayed.    --------------------------------------------------------------------------------------------------------------- Lab Results  Component Value Date   CHOL 101 04/23/2023   HDL 41 04/23/2023   LDLCALC 45 04/23/2023   TRIG 75 05/20/2023   CHOLHDL 2.5 04/23/2023    Lab Results  Component Value Date   HGBA1C 6.6 (H) 04/23/2023     Micro Results Recent Results (from the past 240 hours)  MRSA Next Gen by PCR, Nasal     Status: None   Collection Time: 05/19/23 11:40 AM   Specimen: Nasal Mucosa; Nasal Swab  Result Value Ref Range Status    MRSA by PCR Next Gen NOT DETECTED NOT DETECTED Final    Comment: (NOTE) The GeneXpert MRSA Assay (FDA approved for NASAL specimens only), is one component of a comprehensive MRSA colonization surveillance program. It is not intended to diagnose MRSA infection nor to guide or monitor treatment for MRSA infections. Test performance is not FDA approved in patients less than 70 years old. Performed at [facility], [address] [place], [place] [address]   Culture, blood (Routine X 2) w Reflex to ID Panel     Status: None (Preliminary result)   Collection Time: 05/21/23 10:10 AM   Specimen: BLOOD RIGHT HAND  Result Value Ref Range Status   Specimen Description BLOOD RIGHT HAND  Final   Special Requests   Final    BOTTLES DRAWN AEROBIC AND ANAEROBIC Blood Culture adequate volume   Culture   Final    NO GROWTH 3 DAYS Performed at [facility], [address] [place], [place] [address]    Report Status PENDING  Incomplete  Culture, blood (Routine X 2) w Reflex to ID Panel     Status: None (Preliminary result)   Collection Time: 05/21/23 10:18 AM   Specimen: BLOOD LEFT ARM  Result Value Ref Range Status   Specimen Description BLOOD LEFT ARM  Final   Special Requests   Final    BOTTLES DRAWN AEROBIC AND ANAEROBIC Blood Culture adequate volume   Culture   Final    NO GROWTH 3 DAYS Performed at [facility], [address], [place], [place] [address]    Report Status PENDING  Incomplete  Culture, blood (Routine X 2) w Reflex to ID Panel     Status: None (Preliminary result)   Collection Time: 05/23/23 11:20 PM   Specimen: BLOOD RIGHT HAND  Result Value Ref Range Status   Specimen Description BLOOD RIGHT HAND  Final   Special Requests   Final    BOTTLES DRAWN AEROBIC AND ANAEROBIC Blood Culture adequate volume   Culture   Final    NO GROWTH < 24 HOURS Performed at [facility], [address] [place], [place] [address]    Report Status PENDING  Incomplete   Culture, blood (Routine X 2) w Reflex to ID Panel     Status: None (Preliminary result)   Collection Time: 05/23/23 11:22 PM   Specimen: BLOOD RIGHT ARM  Result Value Ref Range Status   Specimen Description BLOOD RIGHT ARM  Final   Special  Requests   Final    BOTTLES DRAWN AEROBIC AND ANAEROBIC Blood Culture adequate volume   Culture   Final    NO GROWTH < 24 HOURS Performed at [facility], [address] [place], [place] [address]    Report Status PENDING  Incomplete  Respiratory (~20 pathogens) panel by PCR     Status: None   Collection Time: 05/23/23 11:50 PM   Specimen: Nasopharyngeal Swab; Respiratory  Result Value Ref Range Status   Adenovirus NOT DETECTED NOT DETECTED Final   Coronavirus 229E NOT DETECTED NOT DETECTED Final    Comment: (NOTE) The Coronavirus on the Respiratory Panel, DOES NOT test for the novel  Coronavirus (2019 nCoV)    Coronavirus HKU1 NOT DETECTED NOT DETECTED Final   Coronavirus NL63 NOT DETECTED NOT DETECTED Final   Coronavirus OC43 NOT DETECTED NOT DETECTED Final   Metapneumovirus NOT DETECTED NOT DETECTED Final   Rhinovirus / Enterovirus NOT DETECTED NOT DETECTED Final   Influenza A NOT DETECTED NOT DETECTED Final   Influenza B NOT DETECTED NOT DETECTED Final   Parainfluenza Virus 1 NOT DETECTED NOT DETECTED Final   Parainfluenza Virus 2 NOT DETECTED NOT DETECTED Final   Parainfluenza Virus 3 NOT DETECTED NOT DETECTED Final   Parainfluenza Virus 4 NOT DETECTED NOT DETECTED Final   Respiratory Syncytial Virus NOT DETECTED NOT DETECTED Final   Bordetella pertussis NOT DETECTED NOT DETECTED Final   Bordetella Parapertussis NOT DETECTED NOT DETECTED Final   Chlamydophila pneumoniae NOT DETECTED NOT DETECTED Final   Mycoplasma pneumoniae NOT DETECTED NOT DETECTED Final    Comment: Performed at [facility], [address], [place], [place] [address]  Urine Culture (for pregnant, neutropenic or urologic patients or patients with an  indwelling urinary catheter)     Status: None   Collection Time: 05/24/23  2:15 AM   Specimen: Urine, Clean Catch  Result Value Ref Range Status   Specimen Description URINE, CLEAN CATCH  Final   Special Requests NONE  Final   Culture   Final    NO GROWTH Performed at [facility], [address], [place], [place] [address]    Report Status 05/25/2023 FINAL  Final    Radiology Report DG Chest Port 1 View Result Date: 05/25/2023 CLINICAL DATA:  Shortness of breath. EXAM: PORTABLE CHEST 1 VIEW COMPARISON:  05/23/2023 FINDINGS: Low volume film. Cardiopericardial silhouette is at upper limits of normal for size. Streaky densities at the lung bases suggest atelectasis although superimposed pneumonia at the left base is not excluded. No acute bony abnormality. Telemetry leads overlie the chest. IMPRESSION: Low volume film with streaky densities at the lung bases, likely atelectasis. Superimposed pneumonia at the left base not excluded. Electronically Signed   By: Kennith Center M.D.   On: 05/25/2023 07:40   DG CHEST PORT 1 VIEW Result Date: 05/23/2023 CLINICAL DATA:  Pneumonia EXAM: PORTABLE CHEST 1 VIEW COMPARISON:  None Available. FINDINGS: Lungs are well expanded, symmetric, and clear. No pneumothorax or pleural effusion. Cardiac size within normal limits. Pulmonary vascularity is normal. Osseous structures are age-appropriate. No acute bone abnormality. IMPRESSION: No active disease. Electronically Signed   By: Helyn Numbers M.D.   On: 05/23/2023 23:32     Signature  -   Susa Raring M.D on 05/25/2023 at 10:29 AM   -  To page go to [email or web address]

## 2023-05-26 DIAGNOSIS — R569 Unspecified convulsions: Secondary | ICD-10-CM | POA: Diagnosis not present

## 2023-05-26 DIAGNOSIS — Z7189 Other specified counseling: Secondary | ICD-10-CM | POA: Diagnosis not present

## 2023-05-26 DIAGNOSIS — Z515 Encounter for palliative care: Secondary | ICD-10-CM | POA: Diagnosis not present

## 2023-05-26 LAB — CBC WITH DIFFERENTIAL/PLATELET
Abs Immature Granulocytes: 0.22 10*3/uL — ABNORMAL HIGH (ref 0.00–0.07)
Basophils Absolute: 0 10*3/uL (ref 0.0–0.1)
Basophils Relative: 0 %
Eosinophils Absolute: 0 10*3/uL (ref 0.0–0.5)
Eosinophils Relative: 0 %
HCT: 35.9 % — ABNORMAL LOW (ref 39.0–52.0)
Hemoglobin: 12.1 g/dL — ABNORMAL LOW (ref 13.0–17.0)
Immature Granulocytes: 2 %
Lymphocytes Relative: 14 %
Lymphs Abs: 2 10*3/uL (ref 0.7–4.0)
MCH: 32.5 pg (ref 26.0–34.0)
MCHC: 33.7 g/dL (ref 30.0–36.0)
MCV: 96.5 fL (ref 80.0–100.0)
Monocytes Absolute: 1.7 10*3/uL — ABNORMAL HIGH (ref 0.1–1.0)
Monocytes Relative: 12 %
Neutro Abs: 9.9 10*3/uL — ABNORMAL HIGH (ref 1.7–7.7)
Neutrophils Relative %: 72 %
Platelets: 299 10*3/uL (ref 150–400)
RBC: 3.72 MIL/uL — ABNORMAL LOW (ref 4.22–5.81)
RDW: 12.6 % (ref 11.5–15.5)
WBC: 13.9 10*3/uL — ABNORMAL HIGH (ref 4.0–10.5)
nRBC: 0 % (ref 0.0–0.2)

## 2023-05-26 LAB — COMPREHENSIVE METABOLIC PANEL WITH GFR
ALT: 25 U/L (ref 0–44)
AST: 24 U/L (ref 15–41)
Albumin: 2.5 g/dL — ABNORMAL LOW (ref 3.5–5.0)
Alkaline Phosphatase: 90 U/L (ref 38–126)
Anion gap: 12 (ref 5–15)
BUN: 29 mg/dL — ABNORMAL HIGH (ref 8–23)
CO2: 23 mmol/L (ref 22–32)
Calcium: 8.8 mg/dL — ABNORMAL LOW (ref 8.9–10.3)
Chloride: 106 mmol/L (ref 98–111)
Creatinine, Ser: 1.04 mg/dL (ref 0.61–1.24)
GFR, Estimated: >60 mL/min (ref >60)
Glucose, Bld: 134 mg/dL — ABNORMAL HIGH (ref 70–99)
Potassium: 4.1 mmol/L (ref 3.5–5.1)
Sodium: 141 mmol/L (ref 135–145)
Total Bilirubin: 1.4 mg/dL — ABNORMAL HIGH (ref 0.0–1.2)
Total Protein: 5.7 g/dL — ABNORMAL LOW (ref 6.5–8.1)

## 2023-05-26 LAB — GLUCOSE, CAPILLARY
Glucose-Capillary: 162 mg/dL — ABNORMAL HIGH (ref 70–99)
Glucose-Capillary: 130 mg/dL — ABNORMAL HIGH (ref 70–99)
Glucose-Capillary: 148 mg/dL — ABNORMAL HIGH (ref 70–99)
Glucose-Capillary: 172 mg/dL — ABNORMAL HIGH (ref 70–99)
Glucose-Capillary: 150 mg/dL — ABNORMAL HIGH (ref 70–99)

## 2023-05-26 LAB — CULTURE, BLOOD (ROUTINE X 2)
Culture: NO GROWTH
Culture: NO GROWTH
Special Requests: ADEQUATE
Special Requests: ADEQUATE

## 2023-05-26 LAB — C-REACTIVE PROTEIN: CRP: 26.1 mg/dL — ABNORMAL HIGH (ref <1.0)

## 2023-05-26 LAB — PHOSPHORUS: Phosphorus: 3.6 mg/dL (ref 2.5–4.6)

## 2023-05-26 LAB — MAGNESIUM: Magnesium: 1.8 mg/dL (ref 1.7–2.4)

## 2023-05-26 LAB — PROCALCITONIN: Procalcitonin: 0.95 ng/mL

## 2023-05-26 MED ORDER — KCL IN DEXTROSE-NACL 20-5-0.45 MEQ/L-%-% IV SOLN
INTRAVENOUS | Status: AC
  Filled 2023-05-26 (×2): qty 1000

## 2023-05-26 NOTE — Progress Notes (Signed)
 Occupational Therapy Treatment Patient Details Name: Andre [name] MRN: [id number] DOB: [birth year] Today's Date: 05/26/2023   History of present illness 89 y.o. male admitted 3/25 with Sz at home (ILF) and repeat sz enroute. Intubated 3/25-3/26. PMhx: AFib on eliquis, DM, HLD, HOH, diverticulosis, BPH   OT comments  Patient with significant decline in functional abilities.  Near Total A for ADL at bedlevel, and Max A for sitting EOB.  Goals downgraded and Palliative following with discussion of Hospice.  OT will follow along to address deficits depending on goals of care.  Patient will benefit from continued inpatient follow up therapy, <3 hours/day of he can begin to participate.  Spouse would like ROM program for family.        If plan is discharge home, recommend the following:  Assist for transportation;A lot of help with bathing/dressing/bathroom;A lot of help with walking and/or transfers;Supervision due to cognitive status   Equipment Recommendations       Recommendations for Other Services      Precautions / Restrictions Precautions Precautions: Fall Restrictions Weight Bearing Restrictions Per Provider Order: No       Mobility Bed Mobility Overal bed mobility: Needs Assistance Bed Mobility: Supine to Sit, Sit to Supine, Rolling Rolling: Mod assist   Supine to sit: Max assist Sit to supine: Max assist     Patient Response: Cooperative  Transfers Overall transfer level: Needs assistance   Transfers: Sit to/from Stand Sit to Stand: Max assist, +2 physical assistance           General transfer comment: unable to achieve full stand     Balance Overall balance assessment: Needs assistance Sitting-balance support: Feet supported Sitting balance-Leahy Scale: Poor   Postural control: Posterior lean, Right lateral lean   Standing balance-Leahy Scale: Zero                             ADL either performed or assessed with clinical judgement   ADL        Grooming: Wash/dry hands;Wash/dry face;Moderate assistance;Bed level   Upper Body Bathing: Maximal assistance;Bed level   Lower Body Bathing: Total assistance;Bed level   Upper Body Dressing : Maximal assistance;Bed level   Lower Body Dressing: Total assistance;Bed level   Toilet Transfer: Total assistance   Toileting- Clothing Manipulation and Hygiene: Total assistance              Extremity/Trunk Assessment Upper Extremity Assessment Upper Extremity Assessment: Generalized weakness RUE Deficits / Details: decreased end range shoulder flexion RUE Sensation: WNL RUE Coordination: WNL LUE Deficits / Details: decreased end range shoulder flexion LUE Sensation: WNL LUE Coordination: WNL   Lower Extremity Assessment Lower Extremity Assessment: Defer to PT evaluation   Cervical / Trunk Assessment Cervical / Trunk Assessment: Kyphotic    Vision   Vision Assessment?: No apparent visual deficits   Perception Perception Perception: Not tested   Praxis Praxis Praxis: Not tested   Communication Communication Communication: Impaired Factors Affecting Communication: Difficulty expressing self   Cognition Arousal: Lethargic Behavior During Therapy: Flat affect     Orientation impairments: Situation     Attention impairment (select first level of impairment): Selective attention Executive functioning impairment (select all impairments): Initiation                     Following commands impaired: Follows one step commands with increased time      Cueing   Cueing Techniques: Verbal cues,  Gestural cues  Exercises      Shoulder Instructions       General Comments      Pertinent Vitals/ Pain       Pain Assessment Pain Assessment: Faces Faces Pain Scale: No hurt Pain Intervention(s): Monitored during session                                                          Frequency  Min 2X/week        Progress  Toward Goals  OT Goals(current goals can now be found in the care plan section)  Progress towards OT goals: Goals drowngraded-see care plan  Acute Rehab OT Goals OT Goal Formulation: With patient Time For Goal Achievement: 06/12/23 Potential to Achieve Goals: Fair ADL Goals Pt Will Perform Grooming: with supervision;sitting Pt Will Perform Upper Body Dressing: with min assist;sitting Pt Will Perform Lower Body Dressing: with mod assist;sitting/lateral leans Pt Will Transfer to Toilet: with mod assist;with +2 assist;stand pivot transfer;bedside commode Pt/caregiver will Perform Home Exercise Program: Increased strength;Both right and left upper extremity;With minimal assist  Plan      Co-evaluation                 AM-PAC OT "6 Clicks" Daily Activity     Outcome Measure   Help from another person eating meals?: A Lot Help from another person taking care of personal grooming?: A Lot Help from another person toileting, which includes using toliet, bedpan, or urinal?: Total Help from another person bathing (including washing, rinsing, drying)?: A Lot Help from another person to put on and taking off regular upper body clothing?: A Lot Help from another person to put on and taking off regular lower body clothing?: Total 6 Click Score: 10    End of Session Equipment Utilized During Treatment: Gait belt;Oxygen  OT Visit Diagnosis: Unsteadiness on feet (R26.81);Muscle weakness (generalized) (M62.81);Other symptoms and signs involving cognitive function   Activity Tolerance Patient limited by fatigue   Patient Left in bed;with call bell/phone within reach;with family/visitor present   Nurse Communication Mobility status        Time: 1350-1420 OT Time Calculation (min): 30 min  Charges: OT General Charges $OT Visit: 1 Visit OT Treatments $Self Care/Home Management : 8-22 mins $Therapeutic Activity: 8-22 mins  05/26/2023  RP, OTR/L  Acute Rehabilitation  Services  Office:  [phone]   Suzanna Obey 05/26/2023, 2:26 PM

## 2023-05-26 NOTE — Progress Notes (Signed)
 Palliative Medicine Inpatient Follow Up Note HPI: 89 y.o. male with past medical history of AFib on eliquis, DM admitted on 05/19/2023 with confusion and seizure activity at home/ILF.   Today's Discussion 05/26/2023  *Please note that this is a verbal dictation therefore any spelling or grammatical errors are due to the "[employer] One" system interpretation.  Chart reviewed inclusive of vital signs, progress notes, laboratory results, and diagnostic images.   I met with Andre [name] at bedside this morning. He is alert and oriented to self though overall rather sleepy during my time at bedside.  I met in the 5W conference room with patients spouse, Andre [name], son, Andre [name] and son, Andre [name].  I asked what the families understanding of Andre [name]'s present health is. They share understanding that he has a weak cough and is unable to get secretions up. They acknowledge that he is in very poor health at this time though exhibit hope for potential improvements.  Andre [name]'s family and I discussed best case and worst case scenarios under his present condition. They are aware that on Saturday night he had a rapid decline which has altered his hospital course. They are aware that he may not bounce back from this and if after a couple of days he remains to be doing poorly I did introduce the concept of comfort care. We also discussed hospice. I described hospice as a service for patients who have a life expectancy of 6 months or less.  The goal of hospice is the preservation of dignity and quality at the end phases of life. Under hospice care, the focus changes from curative to symptom relief.   We discussed alternatively the hope that Andre [name] is able to mobilize daily, control his secretions, and get to a skilled facility from here to re-gain strength.   Patients family are in shock at how quickly his condition has deteriorated. I provided education on how elderly patients may have a sudden event occur which leads to a  precipitous decline and/or sequence of negative events.   Created space and opportunity for patients family to explore thoughts feelings and fears regarding Andre [name]'s current medical situation. They share the desire to give him a few more days and if he neglect to improve patients wife would prefer if he go to [facility] SN[facility] arm with hospice.  I was able to go back to bedside and wake Andre [name] up - he was able to slowly make his way to the side of the bed to sit upward for about three minutes. He was noted to be quite weak and deconditioned.   At this point the plan it to allow time for outcomes.   Questions and concerns addressed/Palliative Support Provided.   Objective Assessment: Vital Signs Vitals:   05/26/23 0400 05/26/23 0824  BP: (!) 143/76 (!) 140/67  Pulse: 79 78  Resp: (!) 22 20  Temp: 97.8 F (36.6 C) 100 F (37.8 C)  SpO2: 97% 96%    Intake/Output Summary (Last 24 hours) at 05/26/2023 1220 Last data filed at 05/26/2023 1100 Gross per 24 hour  Intake 1454.23 ml  Output 1550 ml  Net -95.77 ml   Last Weight  Most recent update: 05/26/2023  6:47 AM    Weight  86.1 kg (189 lb 13.1 oz)            Gen: Frail elderly Caucasian M chronically ill appearing HEENT: moist mucous membranes CV: Regular rate and rhythm  PULM:  On 4LPM [place], breathing is even and nonlabored ABD:  soft/nontender  EXT: BUE edema  Neuro: Alert and oriented x1  SUMMARY OF RECOMMENDATIONS   DNAR/DNI  Allow time for outcomes  Have discussed best case and worst case scenarios with patients family  If Andre [name] neglects to improve patients family would prefer for him to transition to [facility] SN[facility] with Hospice care  Ongoing support  Time Spent: 51 Billing based on MDM: High ______________________________________________________________________________________ Andre [name] [place] Palliative Medicine Team Team Cell Phone: [phone] Please utilize secure chat with additional  questions, if there is no response within 30 minutes please call the above phone number  Palliative Medicine Team providers are available by phone from 7am to 7pm daily and can be reached through the team cell phone.  Should this patient require assistance outside of these hours, please call the patient's attending physician.

## 2023-05-26 NOTE — Progress Notes (Signed)
 Speech Language Pathology Treatment: Dysphagia  Patient Details Name: Andre [name] MRN: [id number] DOB: 1933/01/31 Today's Date: 05/26/2023 Time: 4098-1191 SLP Time Calculation (min) (ACUTE ONLY): 28 min  Assessment / Plan / Recommendation Clinical Impression  Requested to see pt from MD due to pt doing little better today and wife told MD she didn't think SLP saw her yesterday which this therapist did work with pt. He was more alert today and SLP provided oral care. Mouth with xerostomia but no adhered debris to mucosa. SLP met sons for first time and reviewed swallow intervention until present. He demonstrated s/s aspiration with nectar thick with 2 immediate coughs out of approximately 6 which SLP expects and educated to family that it is not expected he will have improvements in swallow function. Palliative care met with family this morning and plan is to see how he does over next several days. Suspect even if pt not made comfort care that pt/family may opt to initiate diet of puree and nectar with known risks as they do not desire artificial means of nutrition. They report he often requests water and SLP educated on water protocol. He may continue nectar thick liquids from floor stock as desired. SLP wrote order for water protocol- educated RN that he may have tsp sips thin water after oral care for pleasure. SLP will plan to check in next several days.    HPI HPI: Patient is an 89 y.o. male with PMH: DM, a-fib, hiatal hernia, hearing loss (bilateral hearing aids). He presented to the hospital on 05/19/23 with confusion followed by sudden onset of seizure activity. He was transported to the hospital by EMS. In ER he was not protecting his airway and was intubated. He was extubated on 04/22/23 and kept NPO awaiting SLP swallow evaluation. MBS 3/27 recommending Dys 2/ nectar. Concern for aspiration over weekend (3/28-2/29) and SLP reconsulted recommending NPO with comfort feeds floor stock of nectar thick  after oral care.      SLP Plan  Continue with current plan of care      Recommendations for follow up therapy are one component of a multi-disciplinary discharge planning process, led by the attending physician.  Recommendations may be updated based on patient status, additional functional criteria and insurance authorization.    Recommendations  Diet recommendations: Other(comment) (nectar thick on request and started water protocol) Liquids provided via: Teaspoon Medication Administration: Crushed with puree Supervision: Staff to assist with self feeding Compensations: Slow rate;Small sips/bites;Other (Comment);Clear throat intermittently Postural Changes and/or Swallow Maneuvers: Seated upright 90 degrees;Upright 30-60 min after meal                  Oral care QID   Frequent or constant Supervision/Assistance Dysphagia, oropharyngeal phase (R13.12)     Continue with current plan of care     [name]  05/26/2023, 10:06 AM

## 2023-05-26 NOTE — Progress Notes (Signed)
 PROGRESS NOTE                                                                                                                                                                                                             Patient Demographics:    Andre [name], is a 89 y.o. male, DOB - [birth year], MWN:[id number]  Outpatient Primary MD for the patient is Joselyn Arrow, MD    LOS - 7  Admit date - 05/19/2023    Chief Complaint  Patient presents with   Altered Mental Status       Brief Narrative (HPI from H&P)   89yo male resident of SNF with hx AFib on eliquis, DM presents 3/25 with seizure. Altered this morning, somewhat confused then had sudden onset seizure activity. Had recurrent seizure for EMS during transport with L gaze. On arrival to ER was not protecting his airway and was intubated.  Per wife had admission in February after episode of ?syncope/unresponsiveness where seizure, he was admitted to the hospital under ICU care, he was intubated for airway protection, was seen by neurology and transferred to my service on 05/23/2023 on day 4 of his hospital stay.     Significant Hospital Events: Including procedures, antibiotic start and stop dates in addition to other pertinent events   CT head 3/25>> neg acute  CTA head/neck 3/25>>>Negative for large vessel occlusion.  2. Generalized intracranial artery tortuosity but no significant intracranial atherosclerosis. But positive for Bulky Extracranial Atherosclerosis with multifocal High-grade stenoses: - Severe stenosis Right ICA origin approaching a RADIOGRAPHIC STRING SIGN. - superimposed 65% stenosis mid Right CCA. - 70% stenosis proximal Left Subclavian Artery. - Moderate to Severe stenosis at Both Vertebral Artery origins. 3/26 MRI ok no seizure EEG extubated   Subjective:   Patient in bed in no distress but still has considerable upper airway gurgling, weak cough  reflex, denies any headache chest or abdominal pain.   Assessment  & Plan :   Seizure, metabolic encephalopathy.  Seen by neurology, stable MRI, EEG nonacute, placed on Keppra, per neurology continue Keppra.  No further episodes of seizure.  Mentation improving, some hospital-acquired delirium.   Respiratory failure/failure to protect airway due to #1 above- intubated for airway protection, extubated in about 24 hours, complicated by aspiration pneumonia versus ventilator associated pneumonia.  Currently on combination of Rocephin and  azithromycin, continue I-S and flutter valve, speech therapy following, became septic and hypotensive night of 05/23/2023, still having microaspiration, gurgling, extremely weak and is having difficulty protecting his oral secretions.  Chest PT, suctioning, change antibiotics to Unasyn and Zyvox.    Overall prognosis is poor, had long discussion with patient's wife bedside, continue medical treatment, hydrate with IV fluids, antibiotics, and palliative care following, advance diet only if it is safe and cleared by speech therapy, if there is significant decline they are open for transitioning to full comfort measures.  But want to give him another 24 to 48 hours with present treatment, he is DNR.    Paroxysmal AFib on eliquis - continue the patient on Eliquis, add low-dose beta-blocker for proper rate control.   Hypomagnesemia :  Replace   Bradycardia - likely from dexmedetomidine but likely dexmedetomidine making things worse, dexemedetomidine and this improved, not tachycardic.     Ur. Retention - night of 05/23/23 - foley + flomax.  DM 2 -  Hold metformin, SSI   Lab Results  Component Value Date   HGBA1C 6.6 (H) 04/23/2023   CBG (last 3)  Recent Labs    05/25/23 2111 05/26/23 0103 05/26/23 0823  GLUCAP 137* 162* 130*         Condition - Extremely Guarded  Family Communication  :   Wife bedside on 05/23/2023, 05/24/2023 05/25/2023, wife and son  bedside on 05/26/2023 in detail Called son Marina Goodell [phone] on 05/25/2023 at 10:30 AM - message left, updated in detail at 2pm,    Code Status : DNR  Consults  :   Neuro, PCCM, palliative care  PUD Prophylaxis :  PPI   Procedures  :            Disposition Plan  :    Status is: Inpatient   DVT Prophylaxis  :    SCDs Start: 05/19/23 1041 apixaban (ELIQUIS) tablet 5 mg     Lab Results  Component Value Date   PLT 299 05/26/2023    Diet :  Diet Order             Diet NPO time specified Except for: Sips with Meds  Diet effective now                    Inpatient Medications  Scheduled Meds:  apixaban  5 mg Oral BID   Chlorhexidine Gluconate Cloth  6 each Topical Daily   guaiFENesin  15 mL Oral Q6H   insulin aspart  0-15 Units Subcutaneous TID PC,HS,0200   levETIRAcetam  500 mg Oral BID   linezolid  600 mg Oral Q12H   metoprolol tartrate  12.5 mg Oral BID   mouth rinse  15 mL Mouth Rinse 4 times per day   pantoprazole  40 mg Oral Daily   scopolamine  1 patch Transdermal Q72H   simvastatin  20 mg Oral q1800   tamsulosin  0.4 mg Oral Daily   Continuous Infusions:  ampicillin-sulbactam (UNASYN) IV 1.5 g (05/26/23 0422)   dextrose 5 % and 0.45 % NaCl with KCl 20 mEq/L     PRN Meds:.acetaminophen, docusate sodium, levalbuterol, metoprolol tartrate, mouth rinse, polyethylene glycol  Antibiotics  :    Anti-infectives (From admission, onward)    Start     Dose/Rate Route Frequency Ordered Stop   05/24/23 1700  vancomycin (VANCOREADY) IVPB 500 mg/100 mL  Status:  Discontinued        500 mg 100 mL/hr over 60 Minutes  Intravenous Every 12 hours 05/24/23 0244 05/24/23 1040   05/24/23 1130  linezolid (ZYVOX) tablet 600 mg        600 mg Oral Every 12 hours 05/24/23 1040 05/28/23 2359   05/24/23 1130  ampicillin-sulbactam (UNASYN) 1.5 g in sodium chloride 0.9 % 100 mL IVPB        1.5 g 200 mL/hr over 30 Minutes Intravenous Every 6 hours 05/24/23 1040 05/28/23  2359   05/24/23 0300  vancomycin (VANCOREADY) IVPB 2000 mg/400 mL        2,000 mg 200 mL/hr over 120 Minutes Intravenous  Once 05/24/23 0212 05/24/23 1117   05/24/23 0300  ceFEPIme (MAXIPIME) 2 g in sodium chloride 0.9 % 100 mL IVPB  Status:  Discontinued        2 g 200 mL/hr over 30 Minutes Intravenous Every 12 hours 05/24/23 0212 05/24/23 1029   05/21/23 1000  azithromycin (ZITHROMAX) 500 mg in sodium chloride 0.9 % 250 mL IVPB        500 mg 250 mL/hr over 60 Minutes Intravenous Every 24 hours 05/21/23 0914 05/23/23 1000   05/19/23 1930  cefTRIAXone (ROCEPHIN) 1 g in sodium chloride 0.9 % 100 mL IVPB        1 g 200 mL/hr over 30 Minutes Intravenous Every 24 hours 05/19/23 1826 05/23/23 1915         Objective:   Vitals:   05/26/23 0103 05/26/23 0400 05/26/23 0500 05/26/23 0824  BP:  (!) 143/76  (!) 140/67  Pulse:  79  78  Resp:  (!) 22  20  Temp: 99.5 F (37.5 C) 97.8 F (36.6 C)  100 F (37.8 C)  TempSrc: Axillary Axillary  Axillary  SpO2:  97%  96%  Weight:   86.1 kg   Height:        Wt Readings from Last 3 Encounters:  05/26/23 86.1 kg  05/18/23 84.4 kg  04/30/23 86.2 kg     Intake/Output Summary (Last 24 hours) at 05/26/2023 1010 Last data filed at 05/26/2023 0424 Gross per 24 hour  Intake 1071.78 ml  Output 1550 ml  Net -478.22 ml     Physical Exam  Awake Alert, No new F.N deficits, Normal affect [place].AT,PERRAL Supple Neck, No JVD,   Symmetrical Chest wall movement, Good air movement bilaterally, CTAB RRR,No Gallops,Rubs or new Murmurs,  +ve B.Sounds, Abd Soft, No tenderness,   No Cyanosis, Clubbing or edema        Data Review:    Recent Labs  Lab 05/23/23 0423 05/23/23 2320 05/24/23 0405 05/25/23 0428 05/26/23 0440  WBC 11.0* 13.7* 14.6* 14.6* 13.9*  HGB 13.0 11.9* 11.5* 11.5* 12.1*  HCT 37.6* 34.6* 33.7* 34.1* 35.9*  PLT 286 260 231 268 299  MCV 93.5 94.8 96.0 97.4 96.5  MCH 32.3 32.6 32.8 32.9 32.5  MCHC 34.6 34.4 34.1 33.7 33.7  RDW  12.5 12.7 12.9 12.7 12.6  LYMPHSABS  --   --  1.9 1.7 2.0  MONOABS  --   --  1.5* 1.5* 1.7*  EOSABS  --   --  0.1 0.4 0.0  BASOSABS  --   --  0.0 0.0 0.0    Recent Labs  Lab 05/19/23 1210 05/19/23 1213 05/19/23 1424 05/19/23 1552 05/20/23 1952 05/21/23 0426 05/22/23 0349 05/22/23 1004 05/23/23 0423 05/23/23 0821 05/23/23 2320 05/24/23 0405 05/24/23 0847 05/25/23 0428 05/26/23 0440  NA  --    < >  --    < >  --  137   < >  --  139  --  137 139  --  141 141  K  --    < >  --    < >  --  4.2   < >  --  3.6  --  4.0 4.2  --  4.0 4.1  CL  --   --   --    < >  --  107   < >  --  106  --  106 108  --  106 106  CO2  --   --   --    < >  --  21*   < >  --  26  --  22 24  --  25 23  ANIONGAP  --   --   --    < >  --  9   < >  --  7  --  9 7  --  10 12  GLUCOSE  --   --   --    < >  --  113*   < >  --  119*  --  177* 182*  --  156* 134*  BUN  --   --   --    < >  --  21   < >  --  20  --  30* 29*  --  25* 29*  CREATININE  --   --   --    < >  --  1.20   < >  --  1.10  --  1.28* 1.24  --  1.06 1.04  AST  --   --   --   --   --   --   --  22  --   --   --  20  --  28 24  ALT  --   --   --   --   --   --   --  17  --   --   --  21  --  25 25  ALKPHOS  --   --   --   --   --   --   --  72  --   --   --  74  --  88 90  BILITOT  --   --   --   --   --   --   --  1.0  --   --   --  1.1  --  1.3* 1.4*  ALBUMIN  --   --   --   --   --  3.1*  --  2.9*  --   --   --  2.7*  --  2.8* 2.5*  CRP  --   --   --   --   --   --   --   --   --   --   --  20.9*  --  19.8* 26.1*  PROCALCITON  --   --   --   --   --   --   --   --   --   --  0.92  --   --  0.37 0.95  LATICACIDVEN 3.6*  --  3.2*  --  3.0*  --   --   --   --   --   --  1.3 1.1  --   --   TSH 5.072*  --   --   --   --   --   --   --  8.026*  --   --  3.688  --   --   --   BNP  --   --   --   --   --   --   --   --   --  533.0* 454.9* 265.1*  --   --   --   MG 1.4*  --   --    < >  --  1.6*  --  1.8 1.5*  --   --  1.9  --  1.4* 1.8  PHOS  --    --   --    < >  --  3.3  --   --   --  2.4*  --  3.3  --  3.8 3.6  CALCIUM  --   --   --    < >  --  8.4*   < >  --  9.3  --  9.0 8.8*  --  8.8* 8.8*   < > = values in this interval not displayed.      Recent Labs  Lab 05/19/23 1210 05/19/23 1424 05/19/23 1552 05/20/23 1952 05/21/23 0426 05/22/23 1004 05/23/23 0423 05/23/23 5409 05/23/23 2320 05/24/23 0405 05/24/23 0847 05/25/23 0428 05/26/23 0440  CRP  --   --   --   --   --   --   --   --   --  20.9*  --  19.8* 26.1*  PROCALCITON  --   --   --   --   --   --   --   --  0.92  --   --  0.37 0.95  LATICACIDVEN 3.6* 3.2*  --  3.0*  --   --   --   --   --  1.3 1.1  --   --   TSH 5.072*  --   --   --   --   --  8.026*  --   --  3.688  --   --   --   BNP  --   --   --   --   --   --   --  533.0* 454.9* 265.1*  --   --   --   MG 1.4*  --    < >  --    < > 1.8 1.5*  --   --  1.9  --  1.4* 1.8  CALCIUM  --   --    < >  --    < >  --  9.3  --  9.0 8.8*  --  8.8* 8.8*   < > = values in this interval not displayed.    --------------------------------------------------------------------------------------------------------------- Lab Results  Component Value Date   CHOL 101 04/23/2023   HDL 41 04/23/2023   LDLCALC 45 04/23/2023   TRIG 75 05/20/2023   CHOLHDL 2.5 04/23/2023    Lab Results  Component Value Date   HGBA1C 6.6 (H) 04/23/2023     Micro Results Recent Results (from the past 240 hours)  MRSA Next Gen by PCR, Nasal     Status: None   Collection Time: 05/19/23 11:40 AM   Specimen: Nasal Mucosa; Nasal Swab  Result Value Ref Range Status   MRSA by PCR Next Gen NOT DETECTED NOT DETECTED Final    Comment: (NOTE) The GeneXpert MRSA Assay (FDA approved for NASAL specimens only), is one component of a comprehensive MRSA colonization surveillance program. It is not intended to diagnose MRSA  infection nor to guide or monitor treatment for MRSA infections. Test performance is not FDA approved in patients less than 73  years old. Performed at [facility], [address] [place], [place] [address]   Culture, blood (Routine X 2) w Reflex to ID Panel     Status: None   Collection Time: 05/21/23 10:10 AM   Specimen: BLOOD RIGHT HAND  Result Value Ref Range Status   Specimen Description BLOOD RIGHT HAND  Final   Special Requests   Final    BOTTLES DRAWN AEROBIC AND ANAEROBIC Blood Culture adequate volume   Culture   Final    NO GROWTH 5 DAYS Performed at [facility], [address], [place], [place] [address]    Report Status 05/26/2023 FINAL  Final  Culture, blood (Routine X 2) w Reflex to ID Panel     Status: None   Collection Time: 05/21/23 10:18 AM   Specimen: BLOOD LEFT ARM  Result Value Ref Range Status   Specimen Description BLOOD LEFT ARM  Final   Special Requests   Final    BOTTLES DRAWN AEROBIC AND ANAEROBIC Blood Culture adequate volume   Culture   Final    NO GROWTH 5 DAYS Performed at [facility], [address] [place], [place] [address]    Report Status 05/26/2023 FINAL  Final  Culture, blood (Routine X 2) w Reflex to ID Panel     Status: None (Preliminary result)   Collection Time: 05/23/23 11:20 PM   Specimen: BLOOD RIGHT HAND  Result Value Ref Range Status   Specimen Description BLOOD RIGHT HAND  Final   Special Requests   Final    BOTTLES DRAWN AEROBIC AND ANAEROBIC Blood Culture adequate volume   Culture   Final    NO GROWTH 3 DAYS Performed at [facility], [address], [place], [place] [address]    Report Status PENDING  Incomplete  Culture, blood (Routine X 2) w Reflex to ID Panel     Status: None (Preliminary result)   Collection Time: 05/23/23 11:22 PM   Specimen: BLOOD RIGHT ARM  Result Value Ref Range Status   Specimen Description BLOOD RIGHT ARM  Final   Special Requests   Final    BOTTLES DRAWN AEROBIC AND ANAEROBIC Blood Culture adequate volume   Culture   Final    NO GROWTH 3 DAYS Performed at [facility], [address], [place], [place] [address]    Report Status PENDING  Incomplete  Respiratory (~20 pathogens) panel by PCR     Status: None   Collection Time: 05/23/23 11:50 PM   Specimen: Nasopharyngeal Swab; Respiratory  Result Value Ref Range Status   Adenovirus NOT DETECTED NOT DETECTED Final   Coronavirus 229E NOT DETECTED NOT DETECTED Final    Comment: (NOTE) The Coronavirus on the Respiratory Panel, DOES NOT test for the novel  Coronavirus (2019 nCoV)    Coronavirus HKU1 NOT DETECTED NOT DETECTED Final   Coronavirus NL63 NOT DETECTED NOT DETECTED Final   Coronavirus OC43 NOT DETECTED NOT DETECTED Final   Metapneumovirus NOT DETECTED NOT DETECTED Final   Rhinovirus / Enterovirus NOT DETECTED NOT DETECTED Final   Influenza A NOT DETECTED NOT DETECTED Final   Influenza B NOT DETECTED NOT DETECTED Final   Parainfluenza Virus 1 NOT DETECTED NOT DETECTED Final   Parainfluenza Virus 2 NOT DETECTED NOT DETECTED Final   Parainfluenza Virus 3 NOT DETECTED NOT DETECTED  Final   Parainfluenza Virus 4 NOT DETECTED NOT DETECTED Final   Respiratory Syncytial Virus NOT DETECTED NOT DETECTED Final   Bordetella pertussis NOT DETECTED NOT DETECTED Final   Bordetella Parapertussis NOT DETECTED NOT DETECTED Final   Chlamydophila pneumoniae NOT DETECTED NOT DETECTED Final   Mycoplasma pneumoniae NOT DETECTED NOT DETECTED Final    Comment: Performed at [facility], [address] [place], [place] [address]  Urine Culture (for pregnant, neutropenic or urologic patients or patients with an indwelling urinary catheter)     Status: None   Collection Time: 05/24/23  2:15 AM   Specimen: Urine, Clean Catch  Result Value Ref Range Status   Specimen Description URINE, CLEAN CATCH  Final   Special Requests NONE  Final   Culture   Final    NO GROWTH Performed at [facility], [address] [place], [place] [address]    Report Status 05/25/2023 FINAL  Final    Radiology Report DG Chest Port 1  View Result Date: 05/25/2023 CLINICAL DATA:  Shortness of breath. EXAM: PORTABLE CHEST 1 VIEW COMPARISON:  05/23/2023 FINDINGS: Low volume film. Cardiopericardial silhouette is at upper limits of normal for size. Streaky densities at the lung bases suggest atelectasis although superimposed pneumonia at the left base is not excluded. No acute bony abnormality. Telemetry leads overlie the chest. IMPRESSION: Low volume film with streaky densities at the lung bases, likely atelectasis. Superimposed pneumonia at the left base not excluded. Electronically Signed   By: Kennith Center M.D.   On: 05/25/2023 07:40     Signature  -   Susa Raring M.D on 05/26/2023 at 10:10 AM   -  To page go to [email or web address]

## 2023-05-27 ENCOUNTER — Telehealth (HOSPITAL_COMMUNITY): Payer: Self-pay | Admitting: Pharmacy Technician

## 2023-05-27 ENCOUNTER — Other Ambulatory Visit (HOSPITAL_COMMUNITY): Payer: Self-pay

## 2023-05-27 DIAGNOSIS — Z7189 Other specified counseling: Secondary | ICD-10-CM | POA: Diagnosis not present

## 2023-05-27 DIAGNOSIS — R569 Unspecified convulsions: Secondary | ICD-10-CM | POA: Diagnosis not present

## 2023-05-27 DIAGNOSIS — Z515 Encounter for palliative care: Secondary | ICD-10-CM | POA: Diagnosis not present

## 2023-05-27 LAB — GLUCOSE, CAPILLARY
Glucose-Capillary: 169 mg/dL — ABNORMAL HIGH (ref 70–99)
Glucose-Capillary: 164 mg/dL — ABNORMAL HIGH (ref 70–99)
Glucose-Capillary: 149 mg/dL — ABNORMAL HIGH (ref 70–99)
Glucose-Capillary: 134 mg/dL — ABNORMAL HIGH (ref 70–99)

## 2023-05-27 LAB — CBC WITH DIFFERENTIAL/PLATELET
Abs Immature Granulocytes: 0.22 10*3/uL — ABNORMAL HIGH (ref 0.00–0.07)
Basophils Absolute: 0 10*3/uL (ref 0.0–0.1)
Basophils Relative: 0 %
Eosinophils Absolute: 0.1 10*3/uL (ref 0.0–0.5)
Eosinophils Relative: 1 %
HCT: 33.4 % — ABNORMAL LOW (ref 39.0–52.0)
Hemoglobin: 11.1 g/dL — ABNORMAL LOW (ref 13.0–17.0)
Immature Granulocytes: 2 %
Lymphocytes Relative: 13 %
Lymphs Abs: 2 10*3/uL (ref 0.7–4.0)
MCH: 32.4 pg (ref 26.0–34.0)
MCHC: 33.2 g/dL (ref 30.0–36.0)
MCV: 97.4 fL (ref 80.0–100.0)
Monocytes Absolute: 1.3 10*3/uL — ABNORMAL HIGH (ref 0.1–1.0)
Monocytes Relative: 8 %
Neutro Abs: 11.4 10*3/uL — ABNORMAL HIGH (ref 1.7–7.7)
Neutrophils Relative %: 76 %
Platelets: 316 10*3/uL (ref 150–400)
RBC: 3.43 MIL/uL — ABNORMAL LOW (ref 4.22–5.81)
RDW: 12.7 % (ref 11.5–15.5)
WBC: 14.9 10*3/uL — ABNORMAL HIGH (ref 4.0–10.5)
nRBC: 0 % (ref 0.0–0.2)

## 2023-05-27 LAB — COMPREHENSIVE METABOLIC PANEL WITH GFR
ALT: 27 U/L (ref 0–44)
AST: 29 U/L (ref 15–41)
Albumin: 2.2 g/dL — ABNORMAL LOW (ref 3.5–5.0)
Alkaline Phosphatase: 89 U/L (ref 38–126)
Anion gap: 9 (ref 5–15)
BUN: 31 mg/dL — ABNORMAL HIGH (ref 8–23)
CO2: 23 mmol/L (ref 22–32)
Calcium: 8.3 mg/dL — ABNORMAL LOW (ref 8.9–10.3)
Chloride: 108 mmol/L (ref 98–111)
Creatinine, Ser: 1.05 mg/dL (ref 0.61–1.24)
GFR, Estimated: >60 mL/min (ref >60)
Glucose, Bld: 163 mg/dL — ABNORMAL HIGH (ref 70–99)
Potassium: 4 mmol/L (ref 3.5–5.1)
Sodium: 140 mmol/L (ref 135–145)
Total Bilirubin: 1.1 mg/dL (ref 0.0–1.2)
Total Protein: 5.3 g/dL — ABNORMAL LOW (ref 6.5–8.1)

## 2023-05-27 LAB — MAGNESIUM: Magnesium: 1.6 mg/dL — ABNORMAL LOW (ref 1.7–2.4)

## 2023-05-27 LAB — C-REACTIVE PROTEIN: CRP: 30.6 mg/dL — ABNORMAL HIGH (ref <1.0)

## 2023-05-27 LAB — PHOSPHORUS: Phosphorus: 2.6 mg/dL (ref 2.5–4.6)

## 2023-05-27 LAB — PROCALCITONIN: Procalcitonin: 1.55 ng/mL

## 2023-05-27 MED ORDER — LEVETIRACETAM 500 MG PO TABS
500.0000 mg | ORAL_TABLET | Freq: Two times a day (BID) | ORAL | 0 refills | Status: DC
  Filled 2023-05-27: qty 60, 30d supply, fill #0

## 2023-05-27 MED ORDER — POLYETHYLENE GLYCOL 3350 17 GM/SCOOP PO POWD
17.0000 g | Freq: Every day | ORAL | 0 refills | Status: DC | PRN
  Filled 2023-05-27: qty 238, 14d supply, fill #0

## 2023-05-27 MED ORDER — AMOXICILLIN-POT CLAVULANATE 875-125 MG PO TABS
1.0000 | ORAL_TABLET | Freq: Two times a day (BID) | ORAL | 0 refills | Status: DC
  Filled 2023-05-27: qty 6, 3d supply, fill #0

## 2023-05-27 MED ORDER — DOCUSATE SODIUM 100 MG PO CAPS: 100.0000 mg | ORAL_CAPSULE | Freq: Two times a day (BID) | ORAL | Status: DC | PRN

## 2023-05-27 MED ORDER — SCOPOLAMINE 1 MG/3DAYS TD PT72
1.0000 | MEDICATED_PATCH | TRANSDERMAL | 12 refills | Status: DC
  Filled 2023-05-27: qty 10, 30d supply, fill #0

## 2023-05-27 MED ORDER — ACETAMINOPHEN 650 MG RE SUPP
650.0000 mg | Freq: Four times a day (QID) | RECTAL | 0 refills | Status: DC | PRN
  Filled 2023-05-27: qty 12, 3d supply, fill #0

## 2023-05-27 MED ORDER — LORAZEPAM 1 MG PO TABS: 1.0000 mg | ORAL_TABLET | Freq: Three times a day (TID) | ORAL | 0 refills | Status: DC | PRN

## 2023-05-27 MED ORDER — OXYCODONE HCL 5 MG PO TABS: 5.0000 mg | ORAL_TABLET | Freq: Four times a day (QID) | ORAL | 0 refills | Status: DC | PRN

## 2023-05-27 MED ORDER — LINEZOLID 600 MG PO TABS
600.0000 mg | ORAL_TABLET | Freq: Two times a day (BID) | ORAL | 0 refills | Status: DC
  Filled 2023-05-27: qty 6, 3d supply, fill #0

## 2023-05-27 MED ORDER — OXYCODONE HCL 5 MG PO TABS
5.0000 mg | ORAL_TABLET | Freq: Four times a day (QID) | ORAL | 0 refills | Status: DC | PRN
  Filled 2023-05-27: qty 20, 5d supply, fill #0

## 2023-05-27 MED ORDER — TAMSULOSIN HCL 0.4 MG PO CAPS
0.4000 mg | ORAL_CAPSULE | Freq: Every day | ORAL | 0 refills | Status: DC
  Filled 2023-05-27: qty 30, 30d supply, fill #0

## 2023-05-27 MED ORDER — METFORMIN HCL ER 500 MG PO TB24: 500.0000 mg | ORAL_TABLET | Freq: Every day | ORAL | Status: DC

## 2023-05-27 MED ORDER — LORAZEPAM 1 MG PO TABS
1.0000 mg | ORAL_TABLET | Freq: Three times a day (TID) | ORAL | 0 refills | Status: DC | PRN
  Filled 2023-05-27: qty 30, 10d supply, fill #0

## 2023-05-27 MED ORDER — PANTOPRAZOLE SODIUM 40 MG PO TBEC
40.0000 mg | DELAYED_RELEASE_TABLET | Freq: Every day | ORAL | 0 refills | Status: DC
  Filled 2023-05-27: qty 30, 30d supply, fill #0

## 2023-05-27 NOTE — Plan of Care (Signed)

## 2023-05-27 NOTE — Progress Notes (Addendum)
 Physical Therapy Treatment Patient Details Name: Andre Walker MRN: [id number] DOB: 19-Jul-1932 Today's Date: 05/27/2023   History of Present Illness 89 y.o. male admitted 3/25 with Sz at home (ILF) and repeat sz enroute. Intubated 3/25-3/26. PMhx: AFib on eliquis, DM, HLD, HOH, diverticulosis, BPH    PT Comments  Pt seen for PT tx with pt agreeable, wife present & assisting pt with sipping thickened liquids from a spoon & performing oral suction. Pt is AxOx4, follows simple commands with extra time during session. Pt requires max assist for supine>sit with hospital bed features, +2 for sit>supine. Pt tolerates sitting EOB 8 minutes with CGA with cuing but poor return demo of upright head & keeps head flexed. Pt would benefit from ongoing PT tx to address strengthening, balance, & endurance to reduce caregiver burden & increase independence.   If plan is discharge home, recommend the following: Assistance with feeding;Assistance with cooking/housework;Assist for transportation;Supervision due to cognitive status;Two people to help with walking and/or transfers;Two people to help with bathing/dressing/bathroom   Can travel by private vehicle     No  Equipment Recommendations  Hoyer lift;Hospital bed;Wheelchair (measurements PT);Wheelchair cushion (measurements PT)    Recommendations for Other Services       Precautions / Restrictions Precautions Precautions: Fall Recall of Precautions/Restrictions: Impaired Precaution/Restrictions Comments: check BP Restrictions Weight Bearing Restrictions Per Provider Order: No     Mobility  Bed Mobility Overal bed mobility: Needs Assistance Bed Mobility: Supine to Sit Rolling: Max assist   Supine to sit: Max assist, HOB elevated, Used rails (HOB fully elevated, pt does participate moving BLE to EOB with cuing & assistance, upright trunk with max assist) Sit to supine: Total assist, +2 for safety/equipment, +2 for physical assistance         Transfers                        Ambulation/Gait                   Stairs             Wheelchair Mobility     Tilt Bed    Modified Rankin (Stroke Patients Only)       Balance Overall balance assessment: Needs assistance Sitting-balance support: Feet supported Sitting balance-Leahy Scale: Fair Sitting balance - Comments: CGA static sitting                                    Communication    Cognition Arousal: Alert Behavior During Therapy: Flat affect   PT - Cognitive impairments: Problem solving, Safety/Judgement                       PT - Cognition Comments: AxOx4   Following commands impaired: Follows one step commands with increased time    Cueing Cueing Techniques: Verbal cues, Tactile cues  Exercises      General Comments General comments (skin integrity, edema, etc.): VSS, pt c/o lightheadedness but BP stable, pt on 4L/min via  nasal cannula      Pertinent Vitals/Pain Pain Assessment Pain Assessment: Faces Faces Pain Scale: Hurts little more Pain Location: posterior R arm Pain Descriptors / Indicators: Discomfort, Grimacing, Guarding Pain Intervention(s): Repositioned, Limited activity within patient's tolerance, nurse made aware    Home Living  Prior Function            PT Goals (current goals can now be found in the care plan section) Acute Rehab PT Goals Patient Stated Goal: return home and to golf PT Goal Formulation: With patient Time For Goal Achievement: 06/04/23 Potential to Achieve Goals: Poor Progress towards PT goals: Progressing toward goals    Frequency    Min 2X/week      PT Plan      Co-evaluation              AM-PAC PT "6 Clicks" Mobility   Outcome Measure  Help needed turning from your back to your side while in a flat bed without using bedrails?: A Lot Help needed moving from lying on your back to sitting on the side of  a flat bed without using bedrails?: Total Help needed moving to and from a bed to a chair (including a wheelchair)?: Total Help needed standing up from a chair using your arms (e.g., wheelchair or bedside chair)?: Total Help needed to walk in hospital room?: Total Help needed climbing 3-5 steps with a railing? : Total 6 Click Score: 7    End of Session Equipment Utilized During Treatment: Oxygen Activity Tolerance: Patient tolerated treatment well Patient left: in bed;with nursing/sitter in room;with family/visitor present   PT Visit Diagnosis: Difficulty in walking, not elsewhere classified (R26.2);Muscle weakness (generalized) (M62.81)     Time: 1610-9604 PT Time Calculation (min) (ACUTE ONLY): 22 min  Charges:    $Therapeutic Activity: 8-22 mins PT General Charges $$ ACUTE PT VISIT: 1 Visit                     Aleda Grana, PT, DPT 05/27/23, 9:47 AM    Sandi Mariscal 05/27/2023, 9:42 AM

## 2023-05-27 NOTE — TOC Transition Note (Signed)
 Transition of Care [facility]) - Discharge Note   Patient Details  Name: Andre Walker MRN: [id number] Date of Birth: 1932/06/24  Transition of Care [facility]) CM/SW Contact:  Mearl Latin, LCSW Phone Number: 05/27/2023, 4:12 PM   Clinical Narrative:    Patient will DC to: [facility] Anticipated DC date: 05/27/23 Family notified: Spouse at bedside Transport by: Sharin Mons   Per MD patient ready for DC to [facility]. RN to call report prior to discharge [phone] room 63 maples). RN, patient, patient's family, and facility notified of DC. Discharge Summary and FL2 sent to facility. DC packet on chart including signed DNR and scrips. Ambulance transport requested for patient.   CSW will sign off for now as social work intervention is no longer needed. Please consult [place] again if new needs arise.     Final next level of care: Skilled Nursing Facility Barriers to Discharge: Barriers Resolved   Patient Goals and CMS Choice Patient states their goals for this hospitalization and ongoing recovery are:: Rehab CMS Medicare.gov Compare Post Acute Care list provided to:: Patient Represenative (must comment) Choice offered to / list presented to : Spouse [place] ownership interest in [facility].provided to:: Spouse    Discharge Placement   Existing PASRR number confirmed : 05/27/23          Patient chooses bed at: [facility] Patient to be transferred to facility by: [facility] Name of family member notified: Spouse Patient and family notified of of transfer: 05/27/23  Discharge Plan and Services Additional resources added to the After Visit Summary for   In-house Referral: Clinical Social Work   Post Acute Care Choice: Skilled Nursing Facility                               Social Drivers of Health (SDOH) Interventions SDOH Screenings   Food Insecurity: Food Insecurity Present (05/19/2023)  Housing: High Risk (05/19/2023)  Transportation Needs: No  Transportation Needs (05/19/2023)  Utilities: Not At Risk (05/19/2023)  Depression (PHQ2-9): Low Risk  (03/12/2023)  Social Connections: Moderately Integrated (05/19/2023)  Tobacco Use: Low Risk  (05/19/2023)     Readmission Risk Interventions     No data to display

## 2023-05-27 NOTE — TOC Progression Note (Signed)
 Transition of Care [facility]) - Progression Note    Patient Details  Name: Andre Walker MRN: [id number] Date of Birth: 03/21/1932  Transition of Care [facility]) CM/SW Contact  Crawford Tamura Reeves Forth, Student-Social Work Phone Number: 05/27/2023, 12:15 PM  Clinical Narrative:    MSW Intern spoke with pt wife, [name], about going back to [facility] with hospice. [name] stated she was ready to move her husband today and did not have any further questions.  Expected Discharge Plan: Skilled Nursing Facility Barriers to Discharge: Continued Medical Work up, SNF Pending bed offer  Expected Discharge Plan and Services In-house Referral: Clinical Social Work   Post Acute Care Choice: Skilled Nursing Facility Living arrangements for the past 2 months: Independent Living Facility                                       Social Determinants of Health (SDOH) Interventions SDOH Screenings   Food Insecurity: Food Insecurity Present (05/19/2023)  Housing: High Risk (05/19/2023)  Transportation Needs: No Transportation Needs (05/19/2023)  Utilities: Not At Risk (05/19/2023)  Depression (PHQ2-9): Low Risk  (03/12/2023)  Social Connections: Moderately Integrated (05/19/2023)  Tobacco Use: Low Risk  (05/19/2023)    Readmission Risk Interventions     No data to display

## 2023-05-27 NOTE — Progress Notes (Signed)
   Palliative Medicine Inpatient Follow Up Note HPI: 89 y.o. male with past medical history of AFib on eliquis, DM admitted on 05/19/2023 with confusion and seizure activity at home/ILF.   Today's Discussion 05/27/2023  *Please note that this is a verbal dictation therefore any spelling or grammatical errors are due to the "Dragon Medical One" system interpretation.  Chart reviewed inclusive of vital signs, progress notes, laboratory results, and diagnostic images.   I met with Andre Walker and his wife, Andre Walker this morning. Andre Walker is awake and alert. He has less secretion burden this morning though when watching his RN pass medications it appears that he is having a difficult time.  I spoke to Andre Walker at bedside. We discussed his current condition and overall clinical picture. We reviewed that he is likely to continue his present cycle without an end in sight should aggressive measures be continued. Andre Walker shares that she has suspected this also. After speaking to her son and Dr. Randol Kern this morning she has made the decision to take Andre Walker back to [facility] with Hospice care.  We discussed what hosp[facility] look like [facility] though specifics I shared I would defer to the agency they are contracted with.  Offered time for [facility] to express how difficult this has been. She shares understanding of his disease burden and poor long term outcomes. She shares solace with this decision.  Questions and concerns addressed/Palliative Support Provided.   Objective Assessment: Vital Signs Vitals:   05/27/23 0700 05/27/23 0854  BP:  134/69  Pulse: 77 75  Resp: (!) 24 (!) 25  Temp:  99 F (37.2 C)  SpO2:      Intake/Output Summary (Last 24 hours) at 05/27/2023 1417 Last data filed at 05/27/2023 0400 Gross per 24 hour  Intake 1077.75 ml  Output 600 ml  Net 477.75 ml   Last Weight  Most recent update: 05/27/2023  5:17 AM    Weight  87.3 kg (192 lb 7.4 oz)            Gen: Frail  elderly Caucasian M chronically ill appearing HEENT: moist mucous membranes CV: Regular rate and rhythm  PULM:  On 4LPM [place], breathing is even and nonlabored ABD: soft/nontender  EXT: BUE edema  Neuro: Alert and oriented x1  SUMMARY OF RECOMMENDATIONS   DNAR/DNI  Plan for Andre Walker to discharge to [facility] with Hospice today  Time Spent: 63 Billing based on MDM: High ______________________________________________________________________________________ Andre Walker Palliative Medicine Team Team Cell Phone: [phone] Please utilize secure chat with additional questions, if there is no response within 30 minutes please call the above phone number  Palliative Medicine Team providers are available by phone from 7am to 7pm daily and can be reached through the team cell phone.  Should this patient require assistance outside of these hours, please call the patient's attending physician.

## 2023-05-27 NOTE — Discharge Summary (Addendum)
 Physician Discharge Summary  Andre Walker ZHY:[id number] DOB: 1932/07/09 DOA: 05/19/2023  PCP: Joselyn Arrow, MD  Admit date: 05/19/2023 Discharge date: 05/27/2023  Admitted From: (ALF) Disposition:  ( SNF with Hospice)  Recommendations for Outpatient Follow-up:  Management per hospice, goal of care is comfort   Diet recommendation: GERD diet, feeding is for comfort.  Brief/Interim Summary:    89yo male resident of SNF with hx AFib on eliquis, DM presents 3/25 with seizure. Altered this morning, somewhat confused then had sudden onset seizure activity. Had recurrent seizure for EMS during transport with L gaze. On arrival to ER was not protecting his airway and was intubated.  Per wife had admission in February after episode of ?syncope/unresponsiveness where seizure, he was admitted to the hospital under ICU care, he was intubated for airway protection, was seen by neurology and transferred to progressive unit on 05/23/2023 on day 4 of his hospital stay.  Mentation has gradually improved, but overall he remains severely frail, deconditioned, with unreliable oral intake, has been followed closely by palliative and decision has been made to discharge back to his facility under hospice care with wife request, they do request main goal for him is to be comfort and to prevent rehospitalization.  Significant Hospital Events: Including procedures, antibiotic start and stop dates in addition to other pertinent events   CT head 3/25>> neg acute  CTA head/neck 3/25>>>Negative for large vessel occlusion.  2. Generalized intracranial artery tortuosity but no significant intracranial atherosclerosis. But positive for Bulky Extracranial Atherosclerosis with multifocal High-grade stenoses: - Severe stenosis Right ICA origin approaching a RADIOGRAPHIC STRING SIGN. - superimposed 65% stenosis mid Right CCA. - 70% stenosis proximal Left Subclavian Artery. - Moderate to Severe stenosis at Both Vertebral Artery  origins. 3/26 MRI ok no seizure EEG extubated    Seizure, acute metabolic encephalopathy.  Seen by neurology, stable MRI, EEG nonacute, placed on Keppra, per neurology continue Keppra.  No further episodes of seizure.  Patient continues to improve, but overall he is extremely frail, deconditioned, with poor appetite   Respiratory failure/failure to protect airway due to #1 above- intubated for airway protection, extubated in about 24 hours, complicated by aspiration pneumonia versus ventilator associated pneumonia.  Currently on combination of Rocephin and azithromycin, continue I-S and flutter valve, speech therapy following, became septic and hypotensive night of 05/23/2023, still having microaspiration, gurgling, extremely weak and is having difficulty protecting his oral secretions.  Antibiotic has been transitioned to Unasyn and Zyvox, he will be discharged on Augmentin and Zyvox at time of discharge, but overall prognosis is poor, with wife understanding patient at end of his life, palliative medicine has been consulted and current recommendation to go back to his facility with hospice care with main focus is comfort      Paroxysmal AFib on eliquis - continue the patient on Eliquis, add low-dose beta-blocker for proper rate control.   Hypomagnesemia :  Replacd   Bradycardia - likely from dexmedetomidine but likely dexmedetomidine making things worse, dexemedetomidine and this improved, not tachycardic.     Ur. Retention - night of 05/23/23 - foley + flomax.  Continue with Foley catheter on discharge as he is at end-of-life   DM 2 -resume metformin       Discharge Diagnoses:  Principal Problem:   Seizure [facility]) Active Problems:   Benign prostatic hyperplasia   Diabetes mellitus with microalbuminuria (HCC)   Chronic atrial fibrillation [facility])    Discharge Instructions  Discharge Instructions     Ambulatory  referral to Neurology   Complete by: As directed    An appointment is  requested in approximately: 2 weeks      Allergies as of 05/27/2023   No Active Allergies      Medication List     STOP taking these medications    CoQ10 100 MG Caps   COSAMIN DS PO   simvastatin 20 MG tablet Commonly known as: ZOCOR       TAKE these medications    acetaminophen 650 MG suppository Commonly known as: TYLENOL Place 1 suppository (650 mg total) rectally every 6 (six) hours as needed for fever or moderate pain (pain score 4-6).   amoxicillin-clavulanate 875-125 MG tablet Commonly known as: AUGMENTIN Take 1 tablet by mouth 2 (two) times daily.   Calcium Carbonate-Vitamin D 600-400 MG-UNIT tablet Take 1 tablet by mouth daily.   docusate sodium 100 MG capsule Commonly known as: COLACE Take 1 capsule (100 mg total) by mouth 2 (two) times daily as needed for mild constipation.   Eliquis 5 MG Tabs tablet Generic drug: apixaban Take 1 tablet by mouth twice daily   FISH OIL PO Take 1 capsule by mouth daily at 12 noon.   levETIRAcetam 500 MG tablet Commonly known as: KEPPRA Take 1 tablet (500 mg total) by mouth 2 (two) times daily.   linezolid 600 MG tablet Commonly known as: ZYVOX Take 1 tablet (600 mg total) by mouth every 12 (twelve) hours.   loratadine 10 MG tablet Commonly known as: CLARITIN Take 10 mg by mouth daily.   LORazepam 1 MG tablet Commonly known as: ATIVAN Take 1 tablet (1 mg total) by mouth every 8 (eight) hours as needed for anxiety.   magnesium oxide 400 (240 Mg) MG tablet Commonly known as: MAG-OX Take 400 mg by mouth daily.   metFORMIN 500 MG 24 hr tablet Commonly known as: GLUCOPHAGE-XR Take 1 tablet (500 mg total) by mouth daily with breakfast. TAKE 3 TABLETS BY MOUTH ONCE DAILY WITH BREAKFAST What changed:  how much to take how to take this when to take this   multivitamin with minerals tablet Take 1 tablet by mouth daily.   oxyCODONE 5 MG immediate release tablet Commonly known as: Roxicodone Take 1 tablet (5  mg total) by mouth every 6 (six) hours as needed for severe pain (pain score 7-10).   pantoprazole 40 MG tablet Commonly known as: PROTONIX Take 1 tablet (40 mg total) by mouth daily. Start taking on: May 28, 2023   polyethylene glycol powder 17 GM/SCOOP powder Commonly known as: GLYCOLAX/MIRALAX Take 1 capful (17 g) by mouth daily as needed for moderate constipation.   scopolamine 1 MG/3DAYS Commonly known as: TRANSDERM-SCOP Place 1 patch (1.5 mg total) onto the skin every 3 (three) days. Start taking on: May 30, 2023   tamsulosin 0.4 MG Caps capsule Commonly known as: FLOMAX Take 1 capsule (0.4 mg total) by mouth daily. Start taking on: May 28, 2023   VITAMIN C PO Take 1 tablet by mouth daily.        No Active Allergies  Consultations: Neuro, PCCM, palliative care    Procedures/Studies: DG Chest Port 1 View Result Date: 05/25/2023 CLINICAL DATA:  Shortness of breath. EXAM: PORTABLE CHEST 1 VIEW COMPARISON:  05/23/2023 FINDINGS: Low volume film. Cardiopericardial silhouette is at upper limits of normal for size. Streaky densities at the lung bases suggest atelectasis although superimposed pneumonia at the left base is not excluded. No acute bony abnormality. Telemetry leads overlie the chest.  IMPRESSION: Low volume film with streaky densities at the lung bases, likely atelectasis. Superimposed pneumonia at the left base not excluded. Electronically Signed   By: Kennith Center M.D.   On: 05/25/2023 07:40   DG CHEST PORT 1 VIEW Result Date: 05/23/2023 CLINICAL DATA:  Pneumonia EXAM: PORTABLE CHEST 1 VIEW COMPARISON:  None Available. FINDINGS: Lungs are well expanded, symmetric, and clear. No pneumothorax or pleural effusion. Cardiac size within normal limits. Pulmonary vascularity is normal. Osseous structures are age-appropriate. No acute bone abnormality. IMPRESSION: No active disease. Electronically Signed   By: Helyn Numbers M.D.   On: 05/23/2023 23:32   DG Chest Port 1  View Result Date: 05/23/2023 CLINICAL DATA:  Shortness of breath. Atrial fibrillation EXAM: PORTABLE CHEST 1 VIEW COMPARISON:  05/19/2023. FINDINGS: Heart size appears stable. Aortic atherosclerosis. Interval removal of ET tube and enteric tube. Lung volumes are low. Pulmonary vascular congestion noted. Atelectatic changes are again seen in the lung bases. IMPRESSION: 1. Low lung volumes and bibasilar atelectasis. 2. Pulmonary vascular congestion. Electronically Signed   By: Signa Kell M.D.   On: 05/23/2023 07:59   VAS [place] LOWER EXTREMITY VENOUS (DVT) Result Date: 05/21/2023  Lower Venous DVT Study Patient Name:  Andre Walker  Date of Exam:   05/21/2023 Medical Rec #: [id number]      Accession #:    [id number] Date of Birth: 07/06/1932      Patient Gender: M Patient Age:   63 years Exam Location:  [facility] Procedure:      VAS [place] LOWER EXTREMITY VENOUS (DVT) Referring Phys: Vilma Meckel --------------------------------------------------------------------------------  Indications: Fever, A-fib, seizure.  Anticoagulation: Eliquis. Limitations: Patient coughing throughout exam interfering with compressions and dopplers. Comparison Study: No prior exam. Performing Technologist: Fernande Bras  Examination Guidelines: A complete evaluation includes B-mode imaging, spectral Doppler, color Doppler, and power Doppler as needed of all accessible portions of each vessel. Bilateral testing is considered an integral part of a complete examination. Limited examinations for reoccurring indications may be performed as noted. The reflux portion of the exam is performed with the patient in reverse Trendelenburg.  +---------+---------------+---------+-----------+----------+--------------+ RIGHT    CompressibilityPhasicitySpontaneityPropertiesThrombus Aging +---------+---------------+---------+-----------+----------+--------------+ CFV      Full           Yes      Yes                                  +---------+---------------+---------+-----------+----------+--------------+ SFJ      Full           Yes      Yes                                 +---------+---------------+---------+-----------+----------+--------------+ FV Prox  Full                                                        +---------+---------------+---------+-----------+----------+--------------+ FV Mid   Full                                                        +---------+---------------+---------+-----------+----------+--------------+  FV DistalFull                                                        +---------+---------------+---------+-----------+----------+--------------+ PFV      Full                                                        +---------+---------------+---------+-----------+----------+--------------+ POP      Full           Yes      Yes                                 +---------+---------------+---------+-----------+----------+--------------+ PTV      Full                                                        +---------+---------------+---------+-----------+----------+--------------+ PERO     Full                                                        +---------+---------------+---------+-----------+----------+--------------+   +---------+---------------+---------+-----------+----------+--------------+ LEFT     CompressibilityPhasicitySpontaneityPropertiesThrombus Aging +---------+---------------+---------+-----------+----------+--------------+ CFV      Full           Yes      Yes                                 +---------+---------------+---------+-----------+----------+--------------+ SFJ      Full           Yes      Yes                                 +---------+---------------+---------+-----------+----------+--------------+ FV Prox  Full                                                         +---------+---------------+---------+-----------+----------+--------------+ FV Mid   Full                                                        +---------+---------------+---------+-----------+----------+--------------+ FV DistalFull                                                        +---------+---------------+---------+-----------+----------+--------------+  PFV      Full                                                        +---------+---------------+---------+-----------+----------+--------------+ POP      Full           Yes      Yes                                 +---------+---------------+---------+-----------+----------+--------------+ PTV      Full                                                        +---------+---------------+---------+-----------+----------+--------------+ PERO     Full                                                        +---------+---------------+---------+-----------+----------+--------------+     Summary: BILATERAL: - No evidence of deep vein thrombosis seen in the lower extremities, bilaterally. -No evidence of popliteal cyst, bilaterally.   *See table(s) above for measurements and observations. Electronically signed by Heath Lark on 05/21/2023 at 6:48:29 PM.    Final    DG Swallowing Func-Speech Pathology Result Date: 05/21/2023 Table formatting from the original result was not included. Modified Barium Swallow Study Patient Details Name: Andre Walker MRN: [id number] Date of Birth: [birth year] Today's Date: 05/21/2023 HPI/PMH: HPI: Patient is an 89 y.o. male with PMH: DM, a-fib, hiatal hernia, hearing loss (bilateral hearing aids). He presented to the hospital on 05/19/23 with confusion followed by sudden onset of seizure activity. He was transported to the hospital by EMS. In ER he was not protecting his airway and was intubated. He was extubated on 04/22/23 and kept NPO awaiting SLP swallow evaluation. Clinical Impression: Clinical  Impression: Patient presents with an oropharyngeal dysphagia as per this MBS with silent aspiration occuring during and after initiation of swallow with thin liquids. (PAS 8)  Penetration above the vocal cords (PAS 3) and to the vocal cords (PAS 5) that did not clear occured with thin and nectar thick liquids when swallow was delayed to level of pyriform sinus. When swallow was initiated at level of vallecular sinus or posterior laryngeal surface of the epiglottis, no penetration or aspiration observed. Cued cough was effective to expel penetrate however barium transited back into laryngeal vestibule unless swallow was initiated immediately after cough. One instance of silent aspiration of trace amount of nectar thick liquid occured which was preciptated by delayed aspiration of thin liquid residuals. Mild to moderate amount of vallecular residuals and base of tongue residuals remained after initial swallow with solids and liquids but with clearance of some but not all with subsequent swallows. PES opening and bolus transit through PES and upper esophagus was [facility] and no retrograde movement of barium observed. Factors that may increase risk of adverse event in presence of aspiration Rubye Oaks & Clearance Coots 2021): Factors that may increase risk of adverse  event in presence of aspiration Rubye Oaks & Clearance Coots 2021): Frail or deconditioned Recommendations/Plan: Swallowing Evaluation Recommendations Swallowing Evaluation Recommendations Recommendations: PO diet PO Diet Recommendation: Dysphagia 2 (Finely chopped); Mildly thick liquids (Level 2, nectar thick) Liquid Administration via: Cup; Straw Medication Administration: Crushed with puree Supervision: Patient able to self-feed; Staff to assist with self-feeding; Full supervision/cueing for swallowing strategies Swallowing strategies  : Minimize environmental distractions; Slow rate; Small bites/sips; Clear throat intermittently Postural changes: Position pt fully upright for  meals Oral care recommendations: Oral care BID (2x/day) Caregiver Recommendations: Remove water pitcher; Avoid jello, ice cream, thin soups, popsicles; Have oral suction available Treatment Plan Treatment Plan Treatment recommendations: Therapy as outlined in treatment plan below Follow-up recommendations: Skilled nursing-short term rehab (<3 hours/day) Functional status assessment: Patient has had a recent decline in their functional status and demonstrates the ability to make significant improvements in function in a reasonable and predictable amount of time. Treatment frequency: Min 2x/week Treatment duration: 1 week Interventions: Aspiration precaution training; Trials of upgraded texture/liquids; Diet toleration management by SLP; Patient/family education; Compensatory techniques Recommendations Recommendations for follow up therapy are one component of a multi-disciplinary discharge planning process, led by the attending physician.  Recommendations may be updated based on patient status, additional functional criteria and insurance authorization. Assessment: Orofacial Exam: Orofacial Exam Oral Cavity - Dentition: Adequate natural dentition; Missing dentition; Other (Comment) (reports he has bottom partials which are likely at home) Anatomy: Anatomy: WFL Boluses Administered: Boluses Administered Boluses Administered: Thin liquids (Level 0); Mildly thick liquids (Level 2, nectar thick); Moderately thick liquids (Level 3, honey thick); Puree; Solid  Oral Impairment Domain: Oral Impairment Domain Lip Closure: No labial escape Tongue control during bolus hold: Not tested Bolus preparation/mastication: Slow prolonged chewing/mashing with complete recollection Bolus transport/lingual motion: Slow tongue motion Oral residue: Trace residue lining oral structures Location of oral residue : Tongue Initiation of pharyngeal swallow : Posterior laryngeal surface of the epiglottis; Pyriform sinuses  Pharyngeal Impairment  Domain: Pharyngeal Impairment Domain Soft palate elevation: No bolus between soft palate (SP)/pharyngeal wall (PW) Laryngeal elevation: Complete superior movement of thyroid cartilage with complete approximation of arytenoids to epiglottic petiole Anterior hyoid excursion: Partial anterior movement Epiglottic movement: Complete inversion Laryngeal vestibule closure: Incomplete, narrow column air/contrast in laryngeal vestibule Pharyngeal stripping wave : Present - complete Pharyngoesophageal segment opening: Complete distension and complete duration, no obstruction of flow Tongue base retraction: No contrast between tongue base and posterior pharyngeal wall (PPW) Pharyngeal residue: Collection of residue within or on pharyngeal structures Location of pharyngeal residue: Valleculae; Tongue base  Esophageal Impairment Domain: Esophageal Impairment Domain Esophageal clearance upright position: Complete clearance, esophageal coating Pill: No data recorded Penetration/Aspiration Scale Score: Penetration/Aspiration Scale Score 1.  Material does not enter airway: Moderately thick liquids (Level 3, honey thick); Puree; Solid 3.  Material enters airway, remains ABOVE vocal cords and not ejected out: Mildly thick liquids (Level 2, nectar thick) 5.  Material enters airway, CONTACTS cords and not ejected out: Thin liquids (Level 0); Mildly thick liquids (Level 2, nectar thick) 8.  Material enters airway, passes BELOW cords without attempt by patient to eject out (silent aspiration) : Thin liquids (Level 0) Compensatory Strategies: Compensatory Strategies Compensatory strategies: Yes Straw: Effective Effective Straw: Mildly thick liquid (Level 2, nectar thick)   General Information: No data recorded Diet Prior to this Study: Dysphagia 2 (finely chopped); Thin liquids (Level 0)   No data recorded  Respiratory Status: WFL   Supplemental O2: None (Room air)   History of  Recent Intubation: Yes  Behavior/Cognition: Alert;  Cooperative; Lethargic/Drowsy Self-Feeding Abilities: Able to self-feed; Needs set-up for self-feeding Baseline vocal quality/speech: Normal Volitional Cough: Able to elicit Volitional Swallow: Able to elicit Exam Limitations: No limitations Goal Planning: Prognosis for improved oropharyngeal function: Good No data recorded No data recorded Patient/Family Stated Goal: patient has been requesting PO's per RN Consulted and agree with results and recommendations: Patient Pain: Pain Assessment Pain Assessment: Faces Faces Pain Scale: 0 Breathing: 0 Negative Vocalization: 0 Facial Expression: 0 Body Language: 0 Consolability: 0 PAINAD Score: 0 Facial Expression: 0 Body Movements: 0 Muscle Tension: 0 Compliance with ventilator (intubated pts.): 0 Vocalization (extubated pts.): N/A CPOT Total: 0 End of Session: Start Time:SLP Start Time (ACUTE ONLY): 1330 Stop Time: SLP Stop Time (ACUTE ONLY): 1350 Time Calculation:SLP Time Calculation (min) (ACUTE ONLY): 20 min Charges: SLP Evaluations $ SLP Speech Visit: 1 Visit SLP Evaluations $BSS Swallow: 1 Procedure $MBS Swallow: 1 Procedure SLP visit diagnosis: SLP Visit Diagnosis: Dysphagia, oropharyngeal phase (R13.12) Past Medical History: Past Medical History: Diagnosis Date  Actinic keratosis   Dr. Terri Piedra  Atrial fibrillation [facility])   BPH (benign prostatic hyperplasia)   controlled on Hytrin  Colon polyps   Dr. Ewing Schlein  Diabetes mellitus 2004  Diverticulosis   ED (erectile dysfunction)   Gallstones   Hearing loss   high frequency  Hiatal hernia   small, noted on EGD 10/2010  LAE (left atrial enlargement) 01/2019  Moderate, Noted on ECHO  Melanoma (HCC)   right arm  Microalbuminuria   on ACEI for microalbuminuria (does NOT have HTN)  Pneumonia 01/2019  Bilateral, noted on CT  Pure hypercholesterolemia 2006  Traumatic partial tear of biceps tendon 01/2009  "popeye" injury on right  Wears glasses   Wears hearing aid in both ears   Wears partial dentures   lower  Wears partial dentures    lower Past Surgical History: Past Surgical History: Procedure Laterality Date  APPENDECTOMY  1955  BILIARY STENT PLACEMENT N/A 01/28/2019  Procedure: BILIARY STENT PLACEMENT;  Surgeon: Willis Modena, MD;  Location: [facility];  Service: Endoscopy;  Laterality: N/A;  CATARACT EXTRACTION Bilateral R 05/07/15, L 05/21/15  Dr. Dione Booze  CHOLECYSTECTOMY N/A 01/27/2019  Procedure: LAPAROSCOPIC CHOLECYSTECTOMY WITH INTRAOPEREATIVE CHOLANGIOGRAM;  Surgeon: Ovidio Kin, MD;  Location: [facility];  Service: General;  Laterality: N/A;  COLONOSCOPY  11/09, 10/29/10  Dr. Ewing Schlein  ERCP N/A 01/28/2019  Procedure: ENDOSCOPIC RETROGRADE CHOLANGIOPANCREATOGRAPHY (ERCP);  Surgeon: Willis Modena, MD;  Location: [facility];  Service: Endoscopy;  Laterality: N/A;  ERCP N/A 04/12/2019  Procedure: ENDOSCOPIC RETROGRADE CHOLANGIOPANCREATOGRAPHY (ERCP);  Surgeon: Vida Rigger, MD;  Location: [facility];  Service: Endoscopy;  Laterality: N/A;  stent removal  ESOPHAGOGASTRODUODENOSCOPY  10/29/10  Dr. Ewing Schlein  INGUINAL HERNIA REPAIR Right 1989  MELANOMA EXCISION  10/08/10  R upper arm, Dr. Daphine Deutscher  MELANOMA EXCISION  03/16/2012  Procedure: MELANOMA EXCISION;  Surgeon: Valarie Merino, MD;  Location: [facility];  Service: General;  Laterality: Right;  excision of melanoma of right arm  REMOVAL OF STONES  01/28/2019  Procedure: REMOVAL OF STONES;  Surgeon: Willis Modena, MD;  Location: [facility];  Service: Endoscopy;;  SPHINCTEROTOMY  01/28/2019  Procedure: Dennison Mascot;  Surgeon: Willis Modena, MD;  Location: [facility];  Service: Endoscopy;;  SPHINCTEROTOMY  04/12/2019  Procedure: Dennison Mascot;  Surgeon: Vida Rigger, MD;  Location: [facility];  Service: Endoscopy;;  stone sludge  STENT REMOVAL  04/12/2019  Procedure: STENT REMOVAL;  Surgeon: Vida Rigger, MD;  Location: [facility];  Service: Endoscopy;;  TOTAL KNEE ARTHROPLASTY Right 2012  TOTAL KNEE ARTHROPLASTY Left 2005 Angela Nevin, MA, CCC-SLP Speech Therapy'  MR  BRAIN W WO CONTRAST Result Date: 05/20/2023 CLINICAL DATA:  New onset seizures, confusion. EXAM: MRI HEAD WITHOUT AND WITH CONTRAST TECHNIQUE: Multiplanar, multiecho pulse sequences of the brain and surrounding structures were obtained without and with intravenous contrast. CONTRAST:  8mL GADAVIST GADOBUTROL 1 MMOL/ML IV SOLN COMPARISON:  CT head and CTA head and neck 05/19/2023, MRI head 04/22/2023. FINDINGS: Brain: No acute infarct. No evidence of intracranial hemorrhage. Scattered and confluent FLAIR signal abnormality in the periventricular and subcortical white matter. Chronic microhemorrhages in the anterior left frontal lobe and left temporal lobe, similar to prior. 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 Generalized parenchymal volume loss. No mass lesion or midline shift. Normal appearance of midline structures. The basilar cisterns are patent. No extra-axial fluid collections. No abnormal intracranial enhancement. The hippocampi are symmetric in size. There is mild asymmetric FLAIR hyperintensity in the mesial right temporal lobe and right hippocampus. However, there is preserved internal architecture of the hippocampi. The fornices are relatively symmetric. Normal appearance of the mammillary bodies. Ventricles: Prominence of the lateral ventricles suggestive of underlying parenchymal volume loss. Vascular: Skull base flow voids are visualized. Skull and upper cervical spine: The visualized calvarium is unremarkable. Degenerative pannus noted along the dorsal aspect of the dens. Sinuses/Orbits: Mild mucosal thickening in the ethmoid sinuses. Mucosal thickening and near complete opacification of the sphenoid sinuses. Other: Small bilateral mastoid effusions. Fluid noted layering in the nasopharynx. IMPRESSION: No acute intracranial abnormality. No abnormal intracranial enhancement. Mild asymmetric FLAIR signal abnormality in the mesial right temporal lobe and right hippocampus.  Internal architecture of the hippocampi appears preserved. Recommend correlation with EEG findings. Moderate chronic microvascular ischemic changes and parenchymal volume loss. 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 Bilateral sphenoid sinus disease. Small bilateral mastoid effusions. Electronically Signed   By: Emily Filbert M.D.   On: 05/20/2023 11:55   Overnight EEG with video Result Date: 05/20/2023 Charlsie Quest, MD     05/20/2023  8:48 PM Patient Name: Andre Walker MRN: [id number] Epilepsy Attending: Charlsie Quest Referring Physician/Provider: Rejeana Brock, MD Duration: 05/19/2023 1236 to 05/20/2023 0947 Patient history:  89 y.o. male with a history of a single previous episode of syncope versus seizure who presents with witnessed convulsive activity with left gaze deviation. EEG to evaluate for seizure Level of alertness:  comatose AEDs during EEG study: LEV, propofol Technical aspects: This EEG study was done with scalp electrodes positioned according to the 10-20 International system of electrode placement. Electrical activity was reviewed with band pass filter of 1-70Hz , sensitivity of 7 uV/mm, display speed of 54mm/sec with a 60Hz  notched filter applied as appropriate. EEG data were recorded continuously and digitally stored.  Video monitoring was available and reviewed as appropriate. Description: EEG showed continuous generalized low amplitude 3 to 6 Hz theta-delta slowing. Hyperventilation and photic stimulation were not performed.   ABNORMALITY - Continuous slow, generalized IMPRESSION: This study is suggestive of severe diffuse encephalopathy, nonspecific etiology but could be related to sedation. No seizures or epileptiform discharges were seen throughout the recording. Charlsie Quest   DG Abd Portable 1V Result Date: 05/19/2023 CLINICAL DATA:  Orogastric tube placement EXAM: PORTABLE ABDOMEN - 1 VIEW COMPARISON:  Earlier film of the same day FINDINGS:  Orogastric tube extends into the decompressed stomach. Visualized lung bases clear. Aortic and  splenic arterial calcifications. Cholecystectomy clips. Lower abdomen excluded. Lumbar dextroscoliosis with multilevel degenerative change IMPRESSION: Orogastric tube in the stomach. Electronically Signed   By: Corlis Leak M.D.   On: 05/19/2023 16:32   DG CHEST PORT 1 VIEW Result Date: 05/19/2023 CLINICAL DATA:  Orogastric tube and endotracheal tube. EXAM: PORTABLE CHEST 1 VIEW COMPARISON:  Chest x-ray 04/22/2023. FINDINGS: Enteric tube extends below the diaphragm, but distal tip is not included on this study. Endotracheal tube tip is proximally 5.5 cm above the carina. The heart is enlarged. There are minimal atelectatic changes in the lung bases. Costophrenic angles are clear. There is no pneumothorax or acute fracture. IMPRESSION: 1. Endotracheal tube tip is 5.5 cm above the carina. 2. Enteric tube extends below the diaphragm, but distal tip is not included on this study. 3. Cardiomegaly. Electronically Signed   By: Darliss Cheney M.D.   On: 05/19/2023 15:15   CT ANGIO HEAD NECK W WO CM Result Date: 05/19/2023 CLINICAL DATA:  89 year old male with altered mental status. Found with left side gaze which developed into seizure activity. Atrial fibrillation, on Eliquis. EXAM: CT ANGIOGRAPHY HEAD AND NECK TECHNIQUE: Multidetector CT imaging of the head and neck was performed using the standard protocol during bolus administration of intravenous contrast. Multiplanar CT image reconstructions and MIPs were obtained to evaluate the vascular anatomy. Carotid stenosis measurements (when applicable) are obtained utilizing NASCET criteria, using the distal internal carotid diameter as the denominator. RADIATION DOSE REDUCTION: This exam was performed according to the departmental dose-optimization program which includes automated exposure control, adjustment of the mA and/or kV according to patient size and/or use of iterative  reconstruction technique. CONTRAST:  75mL OMNIPAQUE IOHEXOL 350 MG/ML SOLN COMPARISON:  CT head and cervical spine 0942 hours today. FINDINGS: CTA NECK Skeleton: As reported separately today. Upper chest: Intubated. Endotracheal tube tip in good position above the carina. Enteric tube continues into the visible esophagus. Upper lungs well aerated. Other neck: Intubated and oral enteric tubes with mild retained fluid in the pharynx. Other neck soft tissue spaces are within normal limits. Aortic arch: Calcified aortic atherosclerosis.  Three vessel arch. Right carotid system: Brachiocephalic artery soft and calcified plaque with mild tortuosity. Mild involvement of the right CCA origin. Moderate calcified plaque in the right CCA at the level of the larynx, with 65 % stenosis with respect to the distal vessel series 5, image 141. Bulky calcified plaque at the right ICA origin and bulb resulting in high-grade stenosis approaching a radiographic string sign is seen on series 5, image 111. This is along a segment of about 5 mm of the vessel as seen on series 9, image 83. The right ICA remains patent with distal bulb 50% stenosis due to additional bulky calcified plaque. Tortuosity below the skull base. Left carotid system: Mild left CCA origin plaque without staff that mild left CCA plaque without stenosis proximal to the bifurcation. Bulky calcified plaque at the left ICA origin and bulb resulting in 50 % stenosis with respect to the distal vessel. Tortuosity below the skull base. Vertebral arteries: Mild proximal right subclavian artery plaque without stenosis. Bulky calcified plaque at the right vertebral artery origin resulting in moderate to severe stenosis on series 8, image 128. Distal to that the right vertebral artery is mildly tortuous and patent to the skull base without stenosis. Proximal left subclavian artery tortuosity. Bulky proximal left subclavian calcified plaque resulting in 70 % stenosis with respect  to the distal vessel on series 6, image 383.  This continues to the left vertebral artery origin with moderate to severe stenosis there on series 8, image 136. Non dominant left vertebral artery is then patent to the skull base with no additional plaque or stenosis. CTA HEAD Posterior circulation: Patent distal vertebral arteries, vertebrobasilar junction with mild tortuosity and no stenosis. Patent PICA origins, AICA origins, SCA and left PCA origin. Fetal type right PCA origin. Diminutive or absent left posterior communicating artery. Bilateral PCA branches are within normal limits. Anterior circulation: Both ICA siphons are patent. Mild to moderate left siphon calcified plaque without stenosis. Similar mild to moderate right siphon calcified plaque without stenosis. Both siphons are ectatic, greater on the right. Normal right posterior communicating artery origin. Patent carotid termini. Patent MCA and ACA origins. Anterior communicating artery is normal. Left ACA A2 segment is mildly dominant. Bilateral ACA branches are within normal limits. Left MCA M1 segment and bifurcation are patent with tortuosity. Right MCA M1 segment and bifurcation are patent with mild tortuosity. Bilateral MCA branches are patent with mild tortuosity. No branch stenosis or occlusion is identified. Venous sinuses: Not evaluated due to early contrast timing. Anatomic variants: Dominant right vertebral artery. Fetal type right PCA origin. Dominant left ACA A2. Review of the MIP images confirms the above findings IMPRESSION: 1. Negative for large vessel occlusion. 2. Generalized intracranial artery tortuosity but no significant intracranial atherosclerosis. But positive for Bulky Extracranial Atherosclerosis with multifocal High-grade stenoses: - Severe stenosis Right ICA origin approaching a RADIOGRAPHIC STRING SIGN. - superimposed 65% stenosis mid Right CCA. - 70% stenosis proximal Left Subclavian Artery. - Moderate to Severe stenosis at  Both Vertebral Artery origins. 3.  Aortic Atherosclerosis (ICD10-I70.0). Electronically Signed   By: Odessa Fleming M.D.   On: 05/19/2023 10:39   CT Head Wo Contrast Result Date: 05/19/2023 CLINICAL DATA:  Mental status change with unknown cause. Altered mental status EXAM: CT HEAD WITHOUT CONTRAST CT CERVICAL SPINE WITHOUT CONTRAST TECHNIQUE: Multidetector CT imaging of the head and cervical spine was performed following the standard protocol without intravenous contrast. Multiplanar CT image reconstructions of the cervical spine were also generated. RADIATION DOSE REDUCTION: This exam was performed according to the departmental dose-optimization program which includes automated exposure control, adjustment of the mA and/or kV according to patient size and/or use of iterative reconstruction technique. COMPARISON:  Brain MRI 04/22/2023 FINDINGS: CT HEAD FINDINGS Brain: No evidence of acute infarction, hemorrhage, hydrocephalus, extra-axial collection or mass lesion/mass effect. Generalized brain atrophy Vascular: No hyperdense vessel or unexpected calcification. Skull: Normal. Negative for fracture or focal lesion. Sinuses/Orbits: No acute finding. Chronic opacification of paranasal sinuses, especially the ethmoids. CT CERVICAL SPINE FINDINGS Alignment: C6-7 mild anterolisthesis.  No traumatic malalignment. Skull base and vertebrae: No acute fracture. No primary bone lesion or focal pathologic process. Soft tissues and spinal canal: No prevertebral fluid or swelling. No visible canal hematoma. Unremarkable intubation hardware where traversing the neck. Disc levels:  Generalized degenerative endplate and facet spurring. Upper chest: Clear apical lungs IMPRESSION: No evidence of acute intracranial or cervical spine injury. Electronically Signed   By: Tiburcio Pea M.D.   On: 05/19/2023 09:56   CT Cervical Spine Wo Contrast Result Date: 05/19/2023 CLINICAL DATA:  Mental status change with unknown cause. Altered mental  status EXAM: CT HEAD WITHOUT CONTRAST CT CERVICAL SPINE WITHOUT CONTRAST TECHNIQUE: Multidetector CT imaging of the head and cervical spine was performed following the standard protocol without intravenous contrast. Multiplanar CT image reconstructions of the cervical spine were also generated. RADIATION DOSE REDUCTION:  This exam was performed according to the departmental dose-optimization program which includes automated exposure control, adjustment of the mA and/or kV according to patient size and/or use of iterative reconstruction technique. COMPARISON:  Brain MRI 04/22/2023 FINDINGS: CT HEAD FINDINGS Brain: No evidence of acute infarction, hemorrhage, hydrocephalus, extra-axial collection or mass lesion/mass effect. Generalized brain atrophy Vascular: No hyperdense vessel or unexpected calcification. Skull: Normal. Negative for fracture or focal lesion. Sinuses/Orbits: No acute finding. Chronic opacification of paranasal sinuses, especially the ethmoids. CT CERVICAL SPINE FINDINGS Alignment: C6-7 mild anterolisthesis.  No traumatic malalignment. Skull base and vertebrae: No acute fracture. No primary bone lesion or focal pathologic process. Soft tissues and spinal canal: No prevertebral fluid or swelling. No visible canal hematoma. Unremarkable intubation hardware where traversing the neck. Disc levels:  Generalized degenerative endplate and facet spurring. Upper chest: Clear apical lungs IMPRESSION: No evidence of acute intracranial or cervical spine injury. Electronically Signed   By: Tiburcio Pea M.D.   On: 05/19/2023 09:56      Subjective: No significant events overnight as discussed with staff, family frail and deconditioned as discussed with wife, patient denies any complaints today  Discharge Exam: Vitals:   05/27/23 0700 05/27/23 0854  BP:  134/69  Pulse: 77 75  Resp: (!) 24 (!) 25  Temp:  99 F (37.2 C)  SpO2:     Vitals:   05/27/23 0400 05/27/23 0500 05/27/23 0700 05/27/23 0854   BP: (!) 129/56   134/69  Pulse: 70 76 77 75  Resp: (!) 23 (!) 22 (!) 24 (!) 25  Temp: 100 F (37.8 C)   99 F (37.2 C)  TempSrc: Axillary   Oral  SpO2: 96%     Weight:  87.3 kg    Height:        General: Pt is alert, limited frail, deconditioned, chronically ill-appearing Cardiovascular: RRR, S1/S2 +, no rubs, no gallops Respiratory: CTA bilaterally, no wheezing, no rhonchi Abdominal: Soft, NT, ND, bowel sounds + Extremities: no edema, no cyanosis    The results of significant diagnostics from this hospitalization (including imaging, microbiology, ancillary and laboratory) are listed below for reference.     Microbiology: Recent Results (from the past 240 hours)  MRSA Next Gen by PCR, Nasal     Status: None   Collection Time: 05/19/23 11:40 AM   Specimen: Nasal Mucosa; Nasal Swab  Result Value Ref Range Status   MRSA by PCR Next Gen NOT DETECTED NOT DETECTED Final    Comment: (NOTE) The GeneXpert MRSA Assay (FDA approved for NASAL specimens only), is one component of a comprehensive MRSA colonization surveillance program. It is not intended to diagnose MRSA infection nor to guide or monitor treatment for MRSA infections. Test performance is not FDA approved in patients less than 26 years old. Performed at [facility], [address] [place], [place] [address]   Culture, blood (Routine X 2) w Reflex to ID Panel     Status: None   Collection Time: 05/21/23 10:10 AM   Specimen: BLOOD RIGHT HAND  Result Value Ref Range Status   Specimen Description BLOOD RIGHT HAND  Final   Special Requests   Final    BOTTLES DRAWN AEROBIC AND ANAEROBIC Blood Culture adequate volume   Culture   Final    NO GROWTH 5 DAYS Performed at [facility], [address] [place], [place] [address]    Report Status 05/26/2023 FINAL  Final  Culture, blood (Routine X 2) w Reflex  to ID Panel     Status: None   Collection Time: 05/21/23 10:18 AM   Specimen: BLOOD LEFT ARM  Result  Value Ref Range Status   Specimen Description BLOOD LEFT ARM  Final   Special Requests   Final    BOTTLES DRAWN AEROBIC AND ANAEROBIC Blood Culture adequate volume   Culture   Final    NO GROWTH 5 DAYS Performed at [facility], [address], [place], [place] [address]    Report Status 05/26/2023 FINAL  Final  Culture, blood (Routine X 2) w Reflex to ID Panel     Status: None (Preliminary result)   Collection Time: 05/23/23 11:20 PM   Specimen: BLOOD RIGHT HAND  Result Value Ref Range Status   Specimen Description BLOOD RIGHT HAND  Final   Special Requests   Final    BOTTLES DRAWN AEROBIC AND ANAEROBIC Blood Culture adequate volume   Culture   Final    NO GROWTH 4 DAYS Performed at [facility], [address], [place], [place] [address]    Report Status PENDING  Incomplete  Culture, blood (Routine X 2) w Reflex to ID Panel     Status: None (Preliminary result)   Collection Time: 05/23/23 11:22 PM   Specimen: BLOOD RIGHT ARM  Result Value Ref Range Status   Specimen Description BLOOD RIGHT ARM  Final   Special Requests   Final    BOTTLES DRAWN AEROBIC AND ANAEROBIC Blood Culture adequate volume   Culture   Final    NO GROWTH 4 DAYS Performed at [facility], [address], [place], [place] [address]    Report Status PENDING  Incomplete  Respiratory (~20 pathogens) panel by PCR     Status: None   Collection Time: 05/23/23 11:50 PM   Specimen: Nasopharyngeal Swab; Respiratory  Result Value Ref Range Status   Adenovirus NOT DETECTED NOT DETECTED Final   Coronavirus 229E NOT DETECTED NOT DETECTED Final    Comment: (NOTE) The Coronavirus on the Respiratory Panel, DOES NOT test for the novel  Coronavirus (2019 nCoV)    Coronavirus HKU1 NOT DETECTED NOT DETECTED Final   Coronavirus NL63 NOT DETECTED NOT DETECTED Final   Coronavirus OC43 NOT DETECTED NOT DETECTED Final   Metapneumovirus NOT DETECTED NOT DETECTED Final   Rhinovirus / Enterovirus NOT DETECTED  NOT DETECTED Final   Influenza A NOT DETECTED NOT DETECTED Final   Influenza B NOT DETECTED NOT DETECTED Final   Parainfluenza Virus 1 NOT DETECTED NOT DETECTED Final   Parainfluenza Virus 2 NOT DETECTED NOT DETECTED Final   Parainfluenza Virus 3 NOT DETECTED NOT DETECTED Final   Parainfluenza Virus 4 NOT DETECTED NOT DETECTED Final   Respiratory Syncytial Virus NOT DETECTED NOT DETECTED Final   Bordetella pertussis NOT DETECTED NOT DETECTED Final   Bordetella Parapertussis NOT DETECTED NOT DETECTED Final   Chlamydophila pneumoniae NOT DETECTED NOT DETECTED Final   Mycoplasma pneumoniae NOT DETECTED NOT DETECTED Final    Comment: Performed at [facility], [address] [place], [place] [address]  Urine Culture (for pregnant, neutropenic or urologic patients or patients with an indwelling urinary catheter)     Status: None   Collection Time: 05/24/23  2:15 AM   Specimen: Urine, Clean Catch  Result Value Ref Range Status   Specimen Description URINE, CLEAN CATCH  Final   Special Requests NONE  Final   Culture   Final    NO GROWTH Performed  at [facility], [address] [place], [place] [address]    Report Status 05/25/2023 FINAL  Final     Labs: BNP (last 3 results) Recent Labs    05/23/23 0821 05/23/23 2320 05/24/23 0405  BNP 533.0* 454.9* 265.1*   Basic Metabolic Panel: Recent Labs  Lab 05/23/23 0423 05/23/23 0821 05/23/23 2320 05/24/23 0405 05/25/23 0428 05/26/23 0440 05/27/23 0435  NA 139  --  137 139 141 141 140  K 3.6  --  4.0 4.2 4.0 4.1 4.0  CL 106  --  106 108 106 106 108  CO2 26  --  22 24 25 23 23   GLUCOSE 119*  --  177* 182* 156* 134* 163*  BUN 20  --  30* 29* 25* 29* 31*  CREATININE 1.10  --  1.28* 1.24 1.06 1.04 1.05  CALCIUM 9.3  --  9.0 8.8* 8.8* 8.8* 8.3*  MG 1.5*  --   --  1.9 1.4* 1.8 1.6*  PHOS  --  2.4*  --  3.3 3.8 3.6 2.6   Liver Function Tests: Recent Labs  Lab 05/22/23 1004 05/24/23 0405 05/25/23 0428 05/26/23 0440  05/27/23 0435  AST 22 20 28 24 29   ALT 17 21 25 25 27   ALKPHOS 72 74 88 90 89  BILITOT 1.0 1.1 1.3* 1.4* 1.1  PROT 5.7* 5.5* 6.0* 5.7* 5.3*  ALBUMIN 2.9* 2.7* 2.8* 2.5* 2.2*   No results for input(s): "LIPASE", "AMYLASE" in the last 168 hours. No results for input(s): "AMMONIA" in the last 168 hours. CBC: Recent Labs  Lab 05/23/23 2320 05/24/23 0405 05/25/23 0428 05/26/23 0440 05/27/23 0435  WBC 13.7* 14.6* 14.6* 13.9* 14.9*  NEUTROABS  --  11.0* 10.9* 9.9* 11.4*  HGB 11.9* 11.5* 11.5* 12.1* 11.1*  HCT 34.6* 33.7* 34.1* 35.9* 33.4*  MCV 94.8 96.0 97.4 96.5 97.4  PLT 260 231 268 299 316   Cardiac Enzymes: No results for input(s): "CKTOTAL", "CKMB", "CKMBINDEX", "TROPONINI" in the last 168 hours. BNP: Invalid input(s): "POCBNP" CBG: Recent Labs  Lab 05/26/23 1730 05/26/23 2124 05/27/23 0307 05/27/23 0851 05/27/23 1224  GLUCAP 172* 150* 169* 164* 149*   D-Dimer No results for input(s): "DDIMER" in the last 72 hours. Hgb A1c No results for input(s): "HGBA1C" in the last 72 hours. Lipid Profile No results for input(s): "CHOL", "HDL", "LDLCALC", "TRIG", "CHOLHDL", "LDLDIRECT" in the last 72 hours. Thyroid function studies No results for input(s): "TSH", "T4TOTAL", "T3FREE", "THYROIDAB" in the last 72 hours.  Invalid input(s): "FREET3" Anemia work up No results for input(s): "VITAMINB12", "FOLATE", "FERRITIN", "TIBC", "IRON", "RETICCTPCT" in the last 72 hours. Urinalysis    Component Value Date/Time   COLORURINE AMBER (A) 05/24/2023 0215   APPEARANCEUR CLOUDY (A) 05/24/2023 0215   LABSPEC 1.023 05/24/2023 0215   LABSPEC 1.015 02/23/2017 0936   PHURINE 5.0 05/24/2023 0215   GLUCOSEU NEGATIVE 05/24/2023 0215   HGBUR LARGE (A) 05/24/2023 0215   BILIRUBINUR NEGATIVE 05/24/2023 0215   BILIRUBINUR negative 02/23/2017 0936   BILIRUBINUR neg 03/29/2014 0943   KETONESUR NEGATIVE 05/24/2023 0215   PROTEINUR 100 (A) 05/24/2023 0215   UROBILINOGEN negative 03/29/2014  0943   UROBILINOGEN 0.2 03/25/2010 1030   NITRITE NEGATIVE 05/24/2023 0215   LEUKOCYTESUR SMALL (A) 05/24/2023 0215   Sepsis Labs Recent Labs  Lab 05/24/23 0405 05/25/23 0428 05/26/23 0440 05/27/23 0435  WBC 14.6* 14.6* 13.9* 14.9*   Microbiology Recent Results (from the past 240 hours)  MRSA Next Gen by PCR, Nasal  Status: None   Collection Time: 05/19/23 11:40 AM   Specimen: Nasal Mucosa; Nasal Swab  Result Value Ref Range Status   MRSA by PCR Next Gen NOT DETECTED NOT DETECTED Final    Comment: (NOTE) The GeneXpert MRSA Assay (FDA approved for NASAL specimens only), is one component of a comprehensive MRSA colonization surveillance program. It is not intended to diagnose MRSA infection nor to guide or monitor treatment for MRSA infections. Test performance is not FDA approved in patients less than 31 years old. Performed at [facility], [address], [place], [place] [address]   Culture, blood (Routine X 2) w Reflex to ID Panel     Status: None   Collection Time: 05/21/23 10:10 AM   Specimen: BLOOD RIGHT HAND  Result Value Ref Range Status   Specimen Description BLOOD RIGHT HAND  Final   Special Requests   Final    BOTTLES DRAWN AEROBIC AND ANAEROBIC Blood Culture adequate volume   Culture   Final    NO GROWTH 5 DAYS Performed at [facility], [address], [place], [place] [address]    Report Status 05/26/2023 FINAL  Final  Culture, blood (Routine X 2) w Reflex to ID Panel     Status: None   Collection Time: 05/21/23 10:18 AM   Specimen: BLOOD LEFT ARM  Result Value Ref Range Status   Specimen Description BLOOD LEFT ARM  Final   Special Requests   Final    BOTTLES DRAWN AEROBIC AND ANAEROBIC Blood Culture adequate volume   Culture   Final    NO GROWTH 5 DAYS Performed at [facility], [address], [place], [place] [address]    Report Status 05/26/2023 FINAL  Final  Culture, blood (Routine X 2) w Reflex to ID Panel     Status: None  (Preliminary result)   Collection Time: 05/23/23 11:20 PM   Specimen: BLOOD RIGHT HAND  Result Value Ref Range Status   Specimen Description BLOOD RIGHT HAND  Final   Special Requests   Final    BOTTLES DRAWN AEROBIC AND ANAEROBIC Blood Culture adequate volume   Culture   Final    NO GROWTH 4 DAYS Performed at [facility], [address], [place], [place] [address]    Report Status PENDING  Incomplete  Culture, blood (Routine X 2) w Reflex to ID Panel     Status: None (Preliminary result)   Collection Time: 05/23/23 11:22 PM   Specimen: BLOOD RIGHT ARM  Result Value Ref Range Status   Specimen Description BLOOD RIGHT ARM  Final   Special Requests   Final    BOTTLES DRAWN AEROBIC AND ANAEROBIC Blood Culture adequate volume   Culture   Final    NO GROWTH 4 DAYS Performed at [facility], [address], [place], [place] [address]    Report Status PENDING  Incomplete  Respiratory (~20 pathogens) panel by PCR     Status: None   Collection Time: 05/23/23 11:50 PM   Specimen: Nasopharyngeal Swab; Respiratory  Result Value Ref Range Status   Adenovirus NOT DETECTED NOT DETECTED Final   Coronavirus 229E NOT DETECTED NOT DETECTED Final    Comment: (NOTE) The Coronavirus on the Respiratory Panel, DOES NOT test for the novel  Coronavirus (2019 nCoV)    Coronavirus HKU1 NOT DETECTED NOT DETECTED Final   Coronavirus NL63 NOT DETECTED NOT DETECTED Final   Coronavirus OC43 NOT DETECTED NOT DETECTED Final  Metapneumovirus NOT DETECTED NOT DETECTED Final   Rhinovirus / Enterovirus NOT DETECTED NOT DETECTED Final   Influenza A NOT DETECTED NOT DETECTED Final   Influenza B NOT DETECTED NOT DETECTED Final   Parainfluenza Virus 1 NOT DETECTED NOT DETECTED Final   Parainfluenza Virus 2 NOT DETECTED NOT DETECTED Final   Parainfluenza Virus 3 NOT DETECTED NOT DETECTED Final   Parainfluenza Virus 4 NOT DETECTED NOT DETECTED Final   Respiratory Syncytial Virus NOT DETECTED NOT  DETECTED Final   Bordetella pertussis NOT DETECTED NOT DETECTED Final   Bordetella Parapertussis NOT DETECTED NOT DETECTED Final   Chlamydophila pneumoniae NOT DETECTED NOT DETECTED Final   Mycoplasma pneumoniae NOT DETECTED NOT DETECTED Final    Comment: Performed at [facility], [address], [place], [place] [address]  Urine Culture (for pregnant, neutropenic or urologic patients or patients with an indwelling urinary catheter)     Status: None   Collection Time: 05/24/23  2:15 AM   Specimen: Urine, Clean Catch  Result Value Ref Range Status   Specimen Description URINE, CLEAN CATCH  Final   Special Requests NONE  Final   Culture   Final    NO GROWTH Performed at [facility], [address], [place], [place] [address]    Report Status 05/25/2023 FINAL  Final     Time coordinating discharge: Over 30 minutes  SIGNED:   Huey Bienenstock, MD  [facility] 05/27/2023, 1:31 PM Pager   If 7PM-7AM, please contact night-coverage [email or web address] Password [email or web address]

## 2023-05-27 NOTE — NC FL2 (Signed)
 [place] MEDICAID FL2 LEVEL OF CARE FORM     IDENTIFICATION  Patient Name: Andre Walker Birthdate: 01-13-1933 Sex: male Admission Date (Current Location): 05/19/2023  [facility] and [place] Number:  Producer, television/film/video and Address:  [facility] [place], [address], [place], [place] [address]      Provider Number: [id number]  Attending Physician Name and Address:  Elgergawy, Leana Roe, MD  Relative Name and Phone Number:       Current Level of Care: Hospital Recommended Level of Care: Skilled Nursing Facility Prior Approval Number:    Date Approved/Denied:   PASRR Number: [id number]  Discharge Plan: SNF    Current Diagnoses: Patient Active Problem List   Diagnosis Date Noted   Seizure (HCC) 05/19/2023   Syncope 04/22/2023   Acquired thrombophilia (HCC) 08/05/2022   Acute cholecystitis 01/25/2019   Chronic atrial fibrillation (HCC) 01/25/2019   Medication monitoring encounter 08/02/2018   Diabetes mellitus with microalbuminuria (HCC) 08/02/2018   Subclinical hypothyroidism 08/02/2018   Type 2 diabetes mellitus with hypercholesterolemia (HCC) 08/02/2018   Elevated serum creatinine    UTI (urinary tract infection) 01/24/2017   AKI (acute kidney injury) (HCC) 01/24/2017   Squamous cell carcinoma of leg, right 01/14/2016   Senile purpura (HCC) 09/18/2014   Increased prostate specific antigen (PSA) velocity 03/22/2014   Benign prostatic hyperplasia 03/22/2014   Melanoma of right arm 03/05/2012   Actinic keratoses 12/03/2011   Pure hypercholesterolemia 12/02/2010   Type 2 diabetes mellitus, controlled (HCC) 12/02/2010    Orientation RESPIRATION BLADDER Height & Weight     Self  O2 (4L nasal cannula) Incontinent, Indwelling catheter Weight: 192 lb 7.4 oz (87.3 kg) Height:  5\' 10"  (177.8 cm)  BEHAVIORAL SYMPTOMS/MOOD NEUROLOGICAL BOWEL NUTRITION STATUS      Incontinent Diet (See dc summary)  AMBULATORY STATUS COMMUNICATION OF NEEDS Skin    Extensive Assist Verbally Normal                       Personal Care Assistance Level of Assistance  Bathing, Feeding, Dressing Bathing Assistance: Maximum assistance Feeding assistance: Limited assistance Dressing Assistance: Maximum assistance     Functional Limitations Info  Sight, Hearing Sight Info: Impaired Hearing Info: Impaired      SPECIAL CARE FACTORS FREQUENCY  PT (By licensed PT), OT (By licensed OT), Speech therapy     PT Frequency: 5x/week OT Frequency: 5x/week     Speech Therapy Frequency: eval and treat      Contractures Contractures Info: Not present    Additional Factors Info  Code Status, Allergies Code Status Info: DNR Allergies Info: Penicillins   Insulin Sliding Scale Info: See dc summary       Current Medications (05/27/2023):  This is the current hospital active medication list Current Facility-Administered Medications  Medication Dose Route Frequency Provider Last Rate Last Admin   acetaminophen (TYLENOL) suppository 650 mg  650 mg Rectal Q4H PRN Janalyn Shy, Subrina, MD   650 mg at 05/25/23 2328   ampicillin-sulbactam (UNASYN) 1.5 g in sodium chloride 0.9 % 100 mL IVPB  1.5 g Intravenous Q6H Susa Raring K, MD 200 mL/hr at 05/27/23 1009 1.5 g at 05/27/23 1009   apixaban (ELIQUIS) tablet 5 mg  5 mg Oral BID Hunsucker, Lesia Sago, MD   5 mg at 05/27/23 5621   Chlorhexidine Gluconate Cloth 2 % PADS 6 each  6 each Topical Daily Hunsucker, Lesia Sago, MD   6 each at 05/27/23 [id number]  dextrose 5 % and 0.45 % NaCl with KCl 20 mEq/L infusion   Intravenous Continuous Leroy Sea, MD 50 mL/hr at 05/26/23 1700 Infusion Verify at 05/26/23 1700   docusate sodium (COLACE) capsule 100 mg  100 mg Oral BID PRN Hunsucker, Lesia Sago, MD   100 mg at 05/22/23 0834   guaiFENesin (ROBITUSSIN) 100 MG/5ML liquid 15 mL  15 mL Oral Q6H Hunsucker, Lesia Sago, MD   15 mL at 05/27/23 0001   insulin aspart (novoLOG) injection 0-15 Units  0-15 Units Subcutaneous TID  PC,HS,0200 Sundil, Subrina, MD   3 Units at 05/27/23 1007   levalbuterol (XOPENEX) nebulizer solution 0.63 mg  0.63 mg Nebulization Q6H PRN Sundil, Subrina, MD       levETIRAcetam (KEPPRA) tablet 500 mg  500 mg Oral BID Hunsucker, Lesia Sago, MD   500 mg at 05/27/23 0955   linezolid (ZYVOX) tablet 600 mg  600 mg Oral Q12H Leroy Sea, MD   600 mg at 05/27/23 0955   metoprolol tartrate (LOPRESSOR) injection 5 mg  5 mg Intravenous Q8H PRN Leroy Sea, MD       metoprolol tartrate (LOPRESSOR) tablet 12.5 mg  12.5 mg Oral BID Sundil, Subrina, MD   12.5 mg at 05/27/23 1610   Oral care mouth rinse  15 mL Mouth Rinse 4 times per day Karren Burly, MD   15 mL at 05/27/23 0750   Oral care mouth rinse  15 mL Mouth Rinse PRN Hunsucker, Lesia Sago, MD       pantoprazole (PROTONIX) EC tablet 40 mg  40 mg Oral Daily Leroy Sea, MD   40 mg at 05/27/23 0955   polyethylene glycol (MIRALAX / GLYCOLAX) packet 17 g  17 g Oral Daily PRN Hunsucker, Lesia Sago, MD   17 g at 05/22/23 0834   scopolamine (TRANSDERM-SCOP) 1 MG/3DAYS 1.5 mg  1 patch Transdermal Q72H Susa Raring K, MD   1.5 mg at 05/24/23 1330   simvastatin (ZOCOR) tablet 20 mg  20 mg Oral q1800 Hunsucker, Lesia Sago, MD   20 mg at 05/24/23 1700   tamsulosin (FLOMAX) capsule 0.4 mg  0.4 mg Oral Daily Leroy Sea, MD   0.4 mg at 05/27/23 9604     Discharge Medications: Please see discharge summary for a list of discharge medications.  Relevant Imaging Results:  Relevant Lab Results:   Additional Information SSN: [id number]. Add Hospice services at [facility], LCSW

## 2023-05-27 NOTE — TOC Progression Note (Addendum)
 Transition of Care [facility]) - Progression Note    Patient Details  Name: [name] MRN: [id number] Date of Birth: 1932/09/26  [facility]) CM/SW Contact  Mearl Latin, LCSW Phone Number: 05/27/2023, 2:13 PM  Clinical Narrative:    2pm-Awaiting return call from [facility] with report # to schedule PTAR.    4:13 PM-Katie from [facility] returned call and stated patient can be transported. She will arrange hospice services and declined CSW making referral.   Expected Discharge Plan: Skilled Nursing Facility Barriers to Discharge: Continued Medical Work up, SNF Pending bed offer  Expected Discharge Plan and Services In-house Referral: Clinical Social Work   Post Acute Care Choice: Skilled Nursing Facility Living arrangements for the past 2 months: Independent Living Facility Expected Discharge Date: 05/27/23                                     Social Determinants of Health (SDOH) Interventions SDOH Screenings   Food Insecurity: Food Insecurity Present (05/19/2023)  Housing: High Risk (05/19/2023)  Transportation Needs: No Transportation Needs (05/19/2023)  Utilities: Not At Risk (05/19/2023)  Depression (PHQ2-9): Low Risk  (03/12/2023)  Social Connections: Moderately Integrated (05/19/2023)  Tobacco Use: Low Risk  (05/19/2023)    Readmission Risk Interventions     No data to display

## 2023-05-27 NOTE — Discharge Instructions (Addendum)
 Patient is going back to SNF with hospice

## 2023-05-27 NOTE — TOC Progression Note (Addendum)
 Transition of Care [facility]) - Progression Note    Patient Details  Name: Andre Walker MRN: [id number] Date of Birth: May 08, 1932  Transition of [facility]) CM/SW Contact  Mearl Latin, LCSW Phone Number: 05/27/2023, 10:25 AM  Clinical Narrative:    CSW received request for patient to go to [facility] with Hospice. CSW left voicemail for Florentina Addison there to discuss timing.    Katie returned call and confirmed that they can accept patient on SNF side and will talk to spouse about which Hospice to use.     Expected Discharge Plan: Skilled Nursing Facility Barriers to Discharge: Continued Medical Work up, SNF Pending bed offer  Expected Discharge Plan and Services In-house Referral: Clinical Social Work   Post Acute Care Choice: Skilled Nursing Facility Living arrangements for the past 2 months: Independent Living Facility                                       Social Determinants of Health (SDOH) Interventions SDOH Screenings   Food Insecurity: Food Insecurity Present (05/19/2023)  Housing: High Risk (05/19/2023)  Transportation Needs: No Transportation Needs (05/19/2023)  Utilities: Not At Risk (05/19/2023)  Depression (PHQ2-9): Low Risk  (03/12/2023)  Social Connections: Moderately Integrated (05/19/2023)  Tobacco Use: Low Risk  (05/19/2023)    Readmission Risk Interventions     No data to display

## 2023-05-27 NOTE — Telephone Encounter (Signed)
 Pharmacy Patient Advocate Encounter   Received notification from Inpatient Request that prior authorization for Scopolamine 1MG /3DAYS 72 hr patches is required/requested.   Insurance verification completed.   The patient is insured through [employer] [employer] .   Per test claim: PA required; PA submitted to above mentioned insurance via CoverMyMeds [place]/confirmation #/EOC [id number] Status is pending

## 2023-05-27 NOTE — Telephone Encounter (Signed)
 Pharmacy Patient Advocate Encounter  Received notification from [employer] that Prior Authorization for Scopolamine 1MG /3DAYS 72 hr patches  has been APPROVED from 05/27/2023 to 02/24/2024   PA #/Case ID/Reference #: [id number]

## 2023-05-28 ENCOUNTER — Non-Acute Institutional Stay (SKILLED_NURSING_FACILITY): Payer: Self-pay | Admitting: Sports Medicine

## 2023-05-28 ENCOUNTER — Encounter: Payer: Self-pay | Admitting: Sports Medicine

## 2023-05-28 DIAGNOSIS — R569 Unspecified convulsions: Secondary | ICD-10-CM

## 2023-05-28 DIAGNOSIS — J690 Pneumonitis due to inhalation of food and vomit: Secondary | ICD-10-CM | POA: Diagnosis not present

## 2023-05-28 DIAGNOSIS — J9601 Acute respiratory failure with hypoxia: Secondary | ICD-10-CM

## 2023-05-28 DIAGNOSIS — I482 Chronic atrial fibrillation, unspecified: Secondary | ICD-10-CM | POA: Diagnosis not present

## 2023-05-28 DIAGNOSIS — Z515 Encounter for palliative care: Secondary | ICD-10-CM

## 2023-05-28 DIAGNOSIS — E119 Type 2 diabetes mellitus without complications: Secondary | ICD-10-CM

## 2023-05-28 DIAGNOSIS — R339 Retention of urine, unspecified: Secondary | ICD-10-CM

## 2023-05-28 LAB — CULTURE, BLOOD (ROUTINE X 2)
Culture: NO GROWTH
Culture: NO GROWTH
Special Requests: ADEQUATE
Special Requests: ADEQUATE

## 2023-05-28 NOTE — Progress Notes (Signed)
 Provider:  Dr. Venita Sheffield Location:  [facility] Place of Service:   Skilled care Nursing   PCP: Joselyn Arrow, MD Patient Care Team: Joselyn Arrow, MD as PCP - General (Family Medicine) Dr. Luiz Blare as Consulting Physician (Dentistry) Leitha Bleak, Milas Kocher, [facility] (Pharmacist)  Extended Emergency Contact Information Primary Emergency Contact: Andre Walker Address: [address]          [place], [place] [address] [place] of [place] Home Phone: [phone] Mobile Phone: [phone] Relation: Spouse Secondary Emergency Contact: Schmader,Perry Mobile Phone: [phone] Relation: Son  Goals of Care: Advanced Directive information    05/19/2023    4:00 PM  Advanced Directives  Does Patient Have a Medical Advance Directive? Yes  Type of Advance Directive Out of facility DNR (pink MOST or yellow form)  Does patient want to make changes to medical advance directive? No - Guardian declined      No chief complaint on file.     History of Present Illness   89 year old male with a past medical history of A-fib on Eliquis, diabetes, seizure disorder, urinary retention is evaluated for admission to skilled care.  Patient was recently hospitalized from 3/25 to 05/27/2023 for altered mental status.  Patient was intubated for airway protection and his hospital course complicated by aspiration pneumonia.  He discharged on Augmentin and Zyvox.  His overall prognosis is poor.  As per discharge summary palliative medicine met with family and wife decided with hospice care with main focus on comfort. Patient seen and examined in his room.  He is laying on the bed.  Alert and answers questions appropriately. He is currently on supplemental oxygen 4 L. Patient had his breakfast this morning.  Staff reported he ate 25% of his morning meals.  No coughing or choking reported by the staff. Patient complains of cough with yellowish phlegm and exertional shortness of breath. Denies chest  pain, dizziness, abdominal pain, nausea, vomiting. Patient states he is unable to get up from the bed. He is slightly weaker on his right side when compared to its left.   Past Medical History:  Diagnosis Date   Actinic keratosis    Dr. Terri Piedra   Atrial fibrillation [facility])    BPH (benign prostatic hyperplasia)    controlled on Hytrin   Colon polyps    Dr. Ewing Schlein   Diabetes mellitus 2004   Diverticulosis    ED (erectile dysfunction)    Gallstones    Hearing loss    high frequency   Hiatal hernia    small, noted on EGD 10/2010   LAE (left atrial enlargement) 01/2019   Moderate, Noted on ECHO   Melanoma (HCC)    right arm   Microalbuminuria    on ACEI for microalbuminuria (does NOT have HTN)   Pneumonia 01/2019   Bilateral, noted on CT   Pure hypercholesterolemia 2006   Traumatic partial tear of biceps tendon 01/2009   "popeye" injury on right   Wears glasses    Wears hearing aid in both ears    Wears partial dentures    lower   Wears partial dentures    lower   Past Surgical History:  Procedure Laterality Date   APPENDECTOMY  1955   BILIARY STENT PLACEMENT N/A 01/28/2019   Procedure: BILIARY STENT PLACEMENT;  Surgeon: Willis Modena, MD;  Location: [facility];  Service: Endoscopy;  Laterality: N/A;   CATARACT EXTRACTION Bilateral R 05/07/15, L 05/21/15   Dr. Dione Booze   CHOLECYSTECTOMY N/A 01/27/2019  Procedure: LAPAROSCOPIC CHOLECYSTECTOMY WITH INTRAOPEREATIVE CHOLANGIOGRAM;  Surgeon: Ovidio Kin, MD;  Location: [facility]S;  Service: General;  Laterality: N/A;   COLONOSCOPY  11/09, 10/29/10   Dr. Ewing Schlein   ERCP N/A 01/28/2019   Procedure: ENDOSCOPIC RETROGRADE CHOLANGIOPANCREATOGRAPHY (ERCP);  Surgeon: Willis Modena, MD;  Location: [facility];  Service: Endoscopy;  Laterality: N/A;   ERCP N/A 04/12/2019   Procedure: ENDOSCOPIC RETROGRADE CHOLANGIOPANCREATOGRAPHY (ERCP);  Surgeon: Vida Rigger, MD;  Location: [facility];  Service: Endoscopy;  Laterality: N/A;  stent removal    ESOPHAGOGASTRODUODENOSCOPY  10/29/10   Dr. Ewing Schlein   INGUINAL HERNIA REPAIR Right 1989   MELANOMA EXCISION  10/08/10   R upper arm, Dr. Daphine Deutscher   MELANOMA EXCISION  03/16/2012   Procedure: MELANOMA EXCISION;  Surgeon: Valarie Merino, MD;  Location: [facility];  Service: General;  Laterality: Right;  excision of melanoma of right arm   REMOVAL OF STONES  01/28/2019   Procedure: REMOVAL OF STONES;  Surgeon: Willis Modena, MD;  Location: [facility];  Service: Endoscopy;;   SPHINCTEROTOMY  01/28/2019   Procedure: Dennison Mascot;  Surgeon: Willis Modena, MD;  Location: [facility];  Service: Endoscopy;;   SPHINCTEROTOMY  04/12/2019   Procedure: Dennison Mascot;  Surgeon: Vida Rigger, MD;  Location: [facility];  Service: Endoscopy;;  stone sludge   STENT REMOVAL  04/12/2019   Procedure: STENT REMOVAL;  Surgeon: Vida Rigger, MD;  Location: [facility];  Service: Endoscopy;;   TOTAL KNEE ARTHROPLASTY Right 2012   TOTAL KNEE ARTHROPLASTY Left 2005    reports that he has never smoked. He has never used smokeless tobacco. He reports current alcohol use. He reports that he does not use drugs. Social History   Socioeconomic History   Marital status: Married    Spouse name: Not on file   Number of children: 2   Years of education: Not on file   Highest education level: Not on file  Occupational History   Occupation: Retired (from post office)  Tobacco Use   Smoking status: Never   Smokeless tobacco: Never  Vaping Use   Vaping status: Never Used  Substance and Sexual Activity   Alcohol use: Yes    Comment: 2/month   Drug use: No   Sexual activity: Not Currently    Partners: Female  Other Topics Concern   Not on file  Social History Narrative   1 son in [place], 1 son in [place].    Previously did handyman work (very little now).     Moved to [facility] [place] 09/2012   3 grandchildren (granddaughter Toni Amend is in [place] school at [employer], grandson Viviann Spare  works in [employer] in [place], Dahlia Client is at [employer] administrator)      Updated 02/2023   Social Drivers of Health   Financial Resource Strain: Not on file  Food Insecurity: Food Insecurity Present (05/19/2023)   Hunger Vital Sign    Worried About Running Out of Food in the Last Year: Sometimes true    Ran Out of Food in the Last Year: Never true  Transportation Needs: No Transportation Needs (05/19/2023)   PRAPARE - Administrator, Civil Service (Medical): No    Lack of Transportation (Non-Medical): No  Physical Activity: Not on file  Stress: Not on file  Social Connections: Moderately Integrated (05/19/2023)   Social Connection and Isolation Panel [NHANES]    Frequency of Communication with Friends and Family: Once a week    Frequency of Social Gatherings with  Friends and Family: More than three times a week    Attends Religious Services: More than 4 times per year    Active Member of Clubs or Organizations: No    Attends Banker Meetings: Never    Marital Status: Married  Catering manager Violence: Patient Unable To Answer (05/20/2023)   Humiliation, Afraid, Rape, and Kick questionnaire    Fear of Current or Ex-Partner: Patient unable to answer    Emotionally Abused: Patient unable to answer    Physically Abused: Patient unable to answer    Sexually Abused: Patient unable to answer    Functional Status Survey:    Family History  Problem Relation Age of Onset   Diabetes Sister    Diabetes Brother    Heart disease Brother    Hyperlipidemia Brother    Hypertension Brother    Stroke Brother    Diabetes Sister    Cancer Paternal Uncle    Heart disease Paternal Uncle     Health Maintenance  Topic Date Due   COVID-19 Vaccine (8 - Moderna risk 2024-25 season) 06/25/2023   FOOT EXAM  08/06/2023   INFLUENZA VACCINE  09/25/2023   HEMOGLOBIN A1C  10/21/2023   OPHTHALMOLOGY EXAM  10/22/2023   Medicare Annual Wellness (AWV)  03/11/2024    DTaP/Tdap/Td (4 - Td or Tdap) 01/31/2031   Pneumonia Vaccine 53+ Years old  Completed   Zoster Vaccines- Shingrix  Completed   HPV VACCINES  Aged Out    No Active Allergies  Outpatient Encounter Medications as of 05/28/2023  Medication Sig   acetaminophen (TYLENOL) 650 MG suppository Place 1 suppository (650 mg total) rectally every 6 (six) hours as needed for fever or moderate pain (pain score 4-6).   amoxicillin-clavulanate (AUGMENTIN) 875-125 MG tablet Take 1 tablet by mouth 2 (two) times daily.   apixaban (ELIQUIS) 5 MG TABS tablet Take 1 tablet by mouth twice daily   Ascorbic Acid (VITAMIN C PO) Take 1 tablet by mouth daily.   Calcium Carbonate-Vitamin D 600-400 MG-UNIT tablet Take 1 tablet by mouth daily.   docusate sodium (COLACE) 100 MG capsule Take 1 capsule (100 mg total) by mouth 2 (two) times daily as needed for mild constipation.   levETIRAcetam (KEPPRA) 500 MG tablet Take 1 tablet (500 mg total) by mouth 2 (two) times daily.   linezolid (ZYVOX) 600 MG tablet Take 1 tablet (600 mg total) by mouth every 12 (twelve) hours.   loratadine (CLARITIN) 10 MG tablet Take 10 mg by mouth daily.   LORazepam (ATIVAN) 1 MG tablet Take 1 tablet (1 mg total) by mouth every 8 (eight) hours as needed for anxiety.   magnesium oxide (MAG-OX) 400 (240 Mg) MG tablet Take 400 mg by mouth daily.   metFORMIN (GLUCOPHAGE-XR) 500 MG 24 hr tablet Take 1 tablet (500 mg total) by mouth daily with breakfast. TAKE 3 TABLETS BY MOUTH ONCE DAILY WITH BREAKFAST   Multiple Vitamins-Minerals (MULTIVITAMIN WITH MINERALS) tablet Take 1 tablet by mouth daily.   Omega-3 Fatty Acids (FISH OIL PO) Take 1 capsule by mouth daily at 12 noon.   oxyCODONE (ROXICODONE) 5 MG immediate release tablet Take 1 tablet (5 mg total) by mouth every 6 (six) hours as needed for severe pain (pain score 7-10).   pantoprazole (PROTONIX) 40 MG tablet Take 1 tablet (40 mg total) by mouth daily.   polyethylene glycol powder (GLYCOLAX/MIRALAX)  17 GM/SCOOP powder Take 1 capful (17 g) by mouth daily as needed for moderate constipation.   [  START ON 05/30/2023] scopolamine (TRANSDERM-SCOP) 1 MG/3DAYS Place 1 patch (1.5 mg total) onto the skin every 3 (three) days.   tamsulosin (FLOMAX) 0.4 MG CAPS capsule Take 1 capsule (0.4 mg total) by mouth daily.   No facility-administered encounter medications on file as of 05/28/2023.    Review of Systems Negative unless indicated in HPI.  There were no vitals filed for this visit. There is no height or weight on file to calculate BMI. BP Readings from Last 3 Encounters:  05/27/23 134/69  05/18/23 130/74  05/12/23 126/74   Wt Readings from Last 3 Encounters:  05/27/23 192 lb 7.4 oz (87.3 kg)  05/18/23 186 lb (84.4 kg)  04/30/23 190 lb (86.2 kg)   Physical Exam Constitutional:      Appearance: Normal appearance.  Cardiovascular:     Rate and Rhythm: Normal rate and regular rhythm.     Pulses: Normal pulses.     Heart sounds: Normal heart sounds.  Pulmonary:     Effort: No respiratory distress.     Breath sounds: No wheezing or rales.  Abdominal:     General: Bowel sounds are normal. There is no distension.     Palpations: Abdomen is soft.     Tenderness: There is no abdominal tenderness. There is no guarding.     Comments: Foley catheter in place  Neurological:     Mental Status: He is alert.     Comments: Rt side weaker than left      Labs reviewed: Basic Metabolic Panel: Recent Labs    05/25/23 0428 05/26/23 0440 05/27/23 0435  NA 141 141 140  K 4.0 4.1 4.0  CL 106 106 108  CO2 25 23 23   GLUCOSE 156* 134* 163*  BUN 25* 29* 31*  CREATININE 1.06 1.04 1.05  CALCIUM 8.8* 8.8* 8.3*  MG 1.4* 1.8 1.6*  PHOS 3.8 3.6 2.6   Liver Function Tests: Recent Labs    05/25/23 0428 05/26/23 0440 05/27/23 0435  AST 28 24 29   ALT 25 25 27   ALKPHOS 88 90 89  BILITOT 1.3* 1.4* 1.1  PROT 6.0* 5.7* 5.3*  ALBUMIN 2.8* 2.5* 2.2*   No results for input(s): "LIPASE", "AMYLASE"  in the last 8760 hours. No results for input(s): "AMMONIA" in the last 8760 hours. CBC: Recent Labs    05/25/23 0428 05/26/23 0440 05/27/23 0435  WBC 14.6* 13.9* 14.9*  NEUTROABS 10.9* 9.9* 11.4*  HGB 11.5* 12.1* 11.1*  HCT 34.1* 35.9* 33.4*  MCV 97.4 96.5 97.4  PLT 268 299 316   Cardiac Enzymes: Recent Labs    04/22/23 0544  CKTOTAL 97   BNP: Invalid input(s): "POCBNP" Lab Results  Component Value Date   HGBA1C 6.6 (H) 04/23/2023   Lab Results  Component Value Date   TSH 3.688 05/24/2023   Lab Results  Component Value Date   VITAMINB12 469 08/13/2020   No results found for: "FOLATE" No results found for: "IRON", "TIBC", "FERRITIN"  Imaging and Procedures obtained prior to SNF admission: MR BRAIN W WO CONTRAST Result Date: 05/20/2023 CLINICAL DATA:  New onset seizures, confusion. EXAM: MRI HEAD WITHOUT AND WITH CONTRAST TECHNIQUE: Multiplanar, multiecho pulse sequences of the brain and surrounding structures were obtained without and with intravenous contrast. CONTRAST:  8mL GADAVIST GADOBUTROL 1 MMOL/ML IV SOLN COMPARISON:  CT head and CTA head and neck 05/19/2023, MRI head 04/22/2023. FINDINGS: Brain: No acute infarct. No evidence of intracranial hemorrhage. Scattered and confluent FLAIR signal abnormality in the periventricular and subcortical white  matter. Chronic microhemorrhages in the anterior left frontal lobe and left temporal lobe, similar to prior. 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 Generalized parenchymal volume loss. No mass lesion or midline shift. Normal appearance of midline structures. The basilar cisterns are patent. No extra-axial fluid collections. No abnormal intracranial enhancement. The hippocampi are symmetric in size. There is mild asymmetric FLAIR hyperintensity in the mesial right temporal lobe and right hippocampus. However, there is preserved internal architecture of the hippocampi. The fornices are relatively  symmetric. Normal appearance of the mammillary bodies. Ventricles: Prominence of the lateral ventricles suggestive of underlying parenchymal volume loss. Vascular: Skull base flow voids are visualized. Skull and upper cervical spine: The visualized calvarium is unremarkable. Degenerative pannus noted along the dorsal aspect of the dens. Sinuses/Orbits: Mild mucosal thickening in the ethmoid sinuses. Mucosal thickening and near complete opacification of the sphenoid sinuses. Other: Small bilateral mastoid effusions. Fluid noted layering in the nasopharynx. IMPRESSION: No acute intracranial abnormality. No abnormal intracranial enhancement. Mild asymmetric FLAIR signal abnormality in the mesial right temporal lobe and right hippocampus. Internal architecture of the hippocampi appears preserved. Recommend correlation with EEG findings. Moderate chronic microvascular ischemic changes and parenchymal volume loss. 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 Bilateral sphenoid sinus disease. Small bilateral mastoid effusions. Electronically Signed   By: Emily Filbert M.D.   On: 05/20/2023 11:55   Overnight EEG with video Result Date: 05/20/2023 Charlsie Quest, MD     05/20/2023  8:48 PM Patient Name: Andre Walker MRN: [id number] Epilepsy Attending: Charlsie Quest Referring Physician/Provider: Rejeana Brock, MD Duration: 05/19/2023 1236 to 05/20/2023 0947 Patient history:  89 y.o. male with a history of a single previous episode of syncope versus seizure who presents with witnessed convulsive activity with left gaze deviation. EEG to evaluate for seizure Level of alertness:  comatose AEDs during EEG study: LEV, propofol Technical aspects: This EEG study was done with scalp electrodes positioned according to the 10-20 International system of electrode placement. Electrical activity was reviewed with band pass filter of 1-70Hz , sensitivity of 7 uV/mm, display speed of 68mm/sec with a 60Hz   notched filter applied as appropriate. EEG data were recorded continuously and digitally stored.  Video monitoring was available and reviewed as appropriate. Description: EEG showed continuous generalized low amplitude 3 to 6 Hz theta-delta slowing. Hyperventilation and photic stimulation were not performed.   ABNORMALITY - Continuous slow, generalized IMPRESSION: This study is suggestive of severe diffuse encephalopathy, nonspecific etiology but could be related to sedation. No seizures or epileptiform discharges were seen throughout the recording. Charlsie Quest   DG Abd Portable 1V Result Date: 05/19/2023 CLINICAL DATA:  Orogastric tube placement EXAM: PORTABLE ABDOMEN - 1 VIEW COMPARISON:  Earlier film of the same day FINDINGS: Orogastric tube extends into the decompressed stomach. Visualized lung bases clear. Aortic and splenic arterial calcifications. Cholecystectomy clips. Lower abdomen excluded. Lumbar dextroscoliosis with multilevel degenerative change IMPRESSION: Orogastric tube in the stomach. Electronically Signed   By: Corlis Leak M.D.   On: 05/19/2023 16:32   DG CHEST PORT 1 VIEW Result Date: 05/19/2023 CLINICAL DATA:  Orogastric tube and endotracheal tube. EXAM: PORTABLE CHEST 1 VIEW COMPARISON:  Chest x-ray 04/22/2023. FINDINGS: Enteric tube extends below the diaphragm, but distal tip is not included on this study. Endotracheal tube tip is proximally 5.5 cm above the carina. The heart is enlarged. There are minimal atelectatic changes in the lung bases. Costophrenic angles are clear. There is  no pneumothorax or acute fracture. IMPRESSION: 1. Endotracheal tube tip is 5.5 cm above the carina. 2. Enteric tube extends below the diaphragm, but distal tip is not included on this study. 3. Cardiomegaly. Electronically Signed   By: Darliss Cheney M.D.   On: 05/19/2023 15:15   CT ANGIO HEAD NECK W WO CM Result Date: 05/19/2023 CLINICAL DATA:  89 year old male with altered mental status. Found with  left side gaze which developed into seizure activity. Atrial fibrillation, on Eliquis. EXAM: CT ANGIOGRAPHY HEAD AND NECK TECHNIQUE: Multidetector CT imaging of the head and neck was performed using the standard protocol during bolus administration of intravenous contrast. Multiplanar CT image reconstructions and MIPs were obtained to evaluate the vascular anatomy. Carotid stenosis measurements (when applicable) are obtained utilizing NASCET criteria, using the distal internal carotid diameter as the denominator. RADIATION DOSE REDUCTION: This exam was performed according to the departmental dose-optimization program which includes automated exposure control, adjustment of the mA and/or kV according to patient size and/or use of iterative reconstruction technique. CONTRAST:  75mL OMNIPAQUE IOHEXOL 350 MG/ML SOLN COMPARISON:  CT head and cervical spine 0942 hours today. FINDINGS: CTA NECK Skeleton: As reported separately today. Upper chest: Intubated. Endotracheal tube tip in good position above the carina. Enteric tube continues into the visible esophagus. Upper lungs well aerated. Other neck: Intubated and oral enteric tubes with mild retained fluid in the pharynx. Other neck soft tissue spaces are within normal limits. Aortic arch: Calcified aortic atherosclerosis.  Three vessel arch. Right carotid system: Brachiocephalic artery soft and calcified plaque with mild tortuosity. Mild involvement of the right CCA origin. Moderate calcified plaque in the right CCA at the level of the larynx, with 65 % stenosis with respect to the distal vessel series 5, image 141. Bulky calcified plaque at the right ICA origin and bulb resulting in high-grade stenosis approaching a radiographic string sign is seen on series 5, image 111. This is along a segment of about 5 mm of the vessel as seen on series 9, image 83. The right ICA remains patent with distal bulb 50% stenosis due to additional bulky calcified plaque. Tortuosity below  the skull base. Left carotid system: Mild left CCA origin plaque without staff that mild left CCA plaque without stenosis proximal to the bifurcation. Bulky calcified plaque at the left ICA origin and bulb resulting in 50 % stenosis with respect to the distal vessel. Tortuosity below the skull base. Vertebral arteries: Mild proximal right subclavian artery plaque without stenosis. Bulky calcified plaque at the right vertebral artery origin resulting in moderate to severe stenosis on series 8, image 128. Distal to that the right vertebral artery is mildly tortuous and patent to the skull base without stenosis. Proximal left subclavian artery tortuosity. Bulky proximal left subclavian calcified plaque resulting in 70 % stenosis with respect to the distal vessel on series 6, image 383. This continues to the left vertebral artery origin with moderate to severe stenosis there on series 8, image 136. Non dominant left vertebral artery is then patent to the skull base with no additional plaque or stenosis. CTA HEAD Posterior circulation: Patent distal vertebral arteries, vertebrobasilar junction with mild tortuosity and no stenosis. Patent PICA origins, AICA origins, SCA and left PCA origin. Fetal type right PCA origin. Diminutive or absent left posterior communicating artery. Bilateral PCA branches are within normal limits. Anterior circulation: Both ICA siphons are patent. Mild to moderate left siphon calcified plaque without stenosis. Similar mild to moderate right siphon calcified plaque without  stenosis. Both siphons are ectatic, greater on the right. Normal right posterior communicating artery origin. Patent carotid termini. Patent MCA and ACA origins. Anterior communicating artery is normal. Left ACA A2 segment is mildly dominant. Bilateral ACA branches are within normal limits. Left MCA M1 segment and bifurcation are patent with tortuosity. Right MCA M1 segment and bifurcation are patent with mild tortuosity.  Bilateral MCA branches are patent with mild tortuosity. No branch stenosis or occlusion is identified. Venous sinuses: Not evaluated due to early contrast timing. Anatomic variants: Dominant right vertebral artery. Fetal type right PCA origin. Dominant left ACA A2. Review of the MIP images confirms the above findings IMPRESSION: 1. Negative for large vessel occlusion. 2. Generalized intracranial artery tortuosity but no significant intracranial atherosclerosis. But positive for Bulky Extracranial Atherosclerosis with multifocal High-grade stenoses: - Severe stenosis Right ICA origin approaching a RADIOGRAPHIC STRING SIGN. - superimposed 65% stenosis mid Right CCA. - 70% stenosis proximal Left Subclavian Artery. - Moderate to Severe stenosis at Both Vertebral Artery origins. 3.  Aortic Atherosclerosis (ICD10-I70.0). Electronically Signed   By: Odessa Fleming M.D.   On: 05/19/2023 10:39   CT Head Wo Contrast Result Date: 05/19/2023 CLINICAL DATA:  Mental status change with unknown cause. Altered mental status EXAM: CT HEAD WITHOUT CONTRAST CT CERVICAL SPINE WITHOUT CONTRAST TECHNIQUE: Multidetector CT imaging of the head and cervical spine was performed following the standard protocol without intravenous contrast. Multiplanar CT image reconstructions of the cervical spine were also generated. RADIATION DOSE REDUCTION: This exam was performed according to the departmental dose-optimization program which includes automated exposure control, adjustment of the mA and/or kV according to patient size and/or use of iterative reconstruction technique. COMPARISON:  Brain MRI 04/22/2023 FINDINGS: CT HEAD FINDINGS Brain: No evidence of acute infarction, hemorrhage, hydrocephalus, extra-axial collection or mass lesion/mass effect. Generalized brain atrophy Vascular: No hyperdense vessel or unexpected calcification. Skull: Normal. Negative for fracture or focal lesion. Sinuses/Orbits: No acute finding. Chronic opacification of  paranasal sinuses, especially the ethmoids. CT CERVICAL SPINE FINDINGS Alignment: C6-7 mild anterolisthesis.  No traumatic malalignment. Skull base and vertebrae: No acute fracture. No primary bone lesion or focal pathologic process. Soft tissues and spinal canal: No prevertebral fluid or swelling. No visible canal hematoma. Unremarkable intubation hardware where traversing the neck. Disc levels:  Generalized degenerative endplate and facet spurring. Upper chest: Clear apical lungs IMPRESSION: No evidence of acute intracranial or cervical spine injury. Electronically Signed   By: Tiburcio Pea M.D.   On: 05/19/2023 09:56   CT Cervical Spine Wo Contrast Result Date: 05/19/2023 CLINICAL DATA:  Mental status change with unknown cause. Altered mental status EXAM: CT HEAD WITHOUT CONTRAST CT CERVICAL SPINE WITHOUT CONTRAST TECHNIQUE: Multidetector CT imaging of the head and cervical spine was performed following the standard protocol without intravenous contrast. Multiplanar CT image reconstructions of the cervical spine were also generated. RADIATION DOSE REDUCTION: This exam was performed according to the departmental dose-optimization program which includes automated exposure control, adjustment of the mA and/or kV according to patient size and/or use of iterative reconstruction technique. COMPARISON:  Brain MRI 04/22/2023 FINDINGS: CT HEAD FINDINGS Brain: No evidence of acute infarction, hemorrhage, hydrocephalus, extra-axial collection or mass lesion/mass effect. Generalized brain atrophy Vascular: No hyperdense vessel or unexpected calcification. Skull: Normal. Negative for fracture or focal lesion. Sinuses/Orbits: No acute finding. Chronic opacification of paranasal sinuses, especially the ethmoids. CT CERVICAL SPINE FINDINGS Alignment: C6-7 mild anterolisthesis.  No traumatic malalignment. Skull base and vertebrae: No acute fracture. No primary bone lesion  or focal pathologic process. Soft tissues and spinal  canal: No prevertebral fluid or swelling. No visible canal hematoma. Unremarkable intubation hardware where traversing the neck. Disc levels:  Generalized degenerative endplate and facet spurring. Upper chest: Clear apical lungs IMPRESSION: No evidence of acute intracranial or cervical spine injury. Electronically Signed   By: Tiburcio Pea M.D.   On: 05/19/2023 09:56    Assessment and Plan Assessment & Plan  Hospital discharge follow-up Hypoxic respiratory failure Aspiration versus ventilator associated pneumonia Patient is alert Complains of cough and shortness of breath Continue with supplemental oxygen Continue with Augmentin, Zyvox.  Hospice care Wife decided hospice care. Will stop metformin as his A1c is 6.4, calcium, vitamin D, fish oil, vitamin C   History of A-fib Continue with Eliquis for now Will discuss case with the wife regarding continuing Eliquis.  Urinary retention Patient denies abdominal pain Continue with Foley catheter  Seizure disorder Continue with Keppra    35 min Total time spent for obtaining history,  performing a medically appropriate examination and evaluation, reviewing the tests,   documenting clinical information in the electronic or other health record, ,care coordination (not separately reported)

## 2023-06-02 ENCOUNTER — Other Ambulatory Visit

## 2023-06-02 ENCOUNTER — Non-Acute Institutional Stay (SKILLED_NURSING_FACILITY): Payer: Self-pay | Admitting: Nurse Practitioner

## 2023-06-02 ENCOUNTER — Encounter: Payer: Self-pay | Admitting: Nurse Practitioner

## 2023-06-02 DIAGNOSIS — J9621 Acute and chronic respiratory failure with hypoxia: Secondary | ICD-10-CM

## 2023-06-02 DIAGNOSIS — R627 Adult failure to thrive: Secondary | ICD-10-CM | POA: Diagnosis not present

## 2023-06-02 DIAGNOSIS — J9690 Respiratory failure, unspecified, unspecified whether with hypoxia or hypercapnia: Secondary | ICD-10-CM | POA: Insufficient documentation

## 2023-06-02 DIAGNOSIS — R4189 Other symptoms and signs involving cognitive functions and awareness: Secondary | ICD-10-CM | POA: Diagnosis not present

## 2023-06-02 NOTE — Assessment & Plan Note (Signed)
 Tachypnea 40/min, hypoxia even if with O2 Ocean, goal of care is comfort measures, will have prn Morphine and Atropine ophthalmic sol SL available to him.

## 2023-06-02 NOTE — Assessment & Plan Note (Signed)
 Supportive care, dc po meds for now.

## 2023-06-02 NOTE — Assessment & Plan Note (Signed)
 Comfort measures, under Hospice care, will have Morphine 5mg , Lorazepam 1mg , Atropine ophthalmic sol SL q4h prn.

## 2023-06-02 NOTE — Progress Notes (Unsigned)
 Location:   SNF [facility] Room Number: 9 Place of Service:  SNF (31) Provider: Arna Snipe Genaro Bekker NP  Joselyn Arrow, MD  Patient Care Team: Joselyn Arrow, MD as PCP - General (Family Medicine) Dr. Luiz Blare as Consulting Physician (Dentistry) Sherrill Raring, [facility] (Pharmacist)  Extended Emergency Contact Information Primary Emergency Contact: Herschel Senegal Address: [address]          [place], [place] [address] Darden Amber of [place] Home Phone: [phone] Mobile Phone: [phone] Relation: Spouse Secondary Emergency Contact: Frazer,Perry Mobile Phone: [phone] Relation: Son  Code Status: DNR Goals of care: Advanced Directive information    05/19/2023    4:00 PM  Advanced Directives  Does Patient Have a Medical Advance Directive? Yes  Type of Advance Directive Out of facility DNR (pink MOST or yellow form)  Does patient want to make changes to medical advance directive? No - Guardian declined     Chief Complaint  Patient presents with   Acute Visit    Tachycardia, tachypnea, unresponsive, hypoxia    HPI:  Pt is a 89 y.o. male seen today for an acute visit for tachycardia, tachypnea, hypoxia, unresponsiveness.   Hx of Afib on Eliquis, T2DM, seizures, urinary retention with recent hospitalization 05/19/23-05/27/23 for AMS. Hospital stay was complicated with aspiration PNA, fully treated with antibiotics and successfully extubated. His goal of care is comfort measures and he is under Hospice care.    Past Medical History:  Diagnosis Date   Actinic keratosis    Dr. Terri Piedra   Atrial fibrillation [facility])    BPH (benign prostatic hyperplasia)    controlled on Hytrin   Colon polyps    Dr. Ewing Schlein   Diabetes mellitus 2004   Diverticulosis    ED (erectile dysfunction)    Gallstones    Hearing loss    high frequency   Hiatal hernia    small, noted on EGD 10/2010   LAE (left atrial enlargement) 01/2019   Moderate, Noted on ECHO   Melanoma (HCC)    right arm    Microalbuminuria    on ACEI for microalbuminuria (does NOT have HTN)   Pneumonia 01/2019   Bilateral, noted on CT   Pure hypercholesterolemia 2006   Traumatic partial tear of biceps tendon 01/2009   "popeye" injury on right   Wears glasses    Wears hearing aid in both ears    Wears partial dentures    lower   Wears partial dentures    lower   Past Surgical History:  Procedure Laterality Date   APPENDECTOMY  1955   BILIARY STENT PLACEMENT N/A 01/28/2019   Procedure: BILIARY STENT PLACEMENT;  Surgeon: Willis Modena, MD;  Location: [facility];  Service: Endoscopy;  Laterality: N/A;   CATARACT EXTRACTION Bilateral R 05/07/15, L 05/21/15   Dr. Dione Booze   CHOLECYSTECTOMY N/A 01/27/2019   Procedure: LAPAROSCOPIC CHOLECYSTECTOMY WITH INTRAOPEREATIVE CHOLANGIOGRAM;  Surgeon: Ovidio Kin, MD;  Location: [facility];  Service: General;  Laterality: N/A;   COLONOSCOPY  11/09, 10/29/10   Dr. Ewing Schlein   ERCP N/A 01/28/2019   Procedure: ENDOSCOPIC RETROGRADE CHOLANGIOPANCREATOGRAPHY (ERCP);  Surgeon: Willis Modena, MD;  Location: [facility];  Service: Endoscopy;  Laterality: N/A;   ERCP N/A 04/12/2019   Procedure: ENDOSCOPIC RETROGRADE CHOLANGIOPANCREATOGRAPHY (ERCP);  Surgeon: Vida Rigger, MD;  Location: [facility];  Service: Endoscopy;  Laterality: N/A;  stent removal   ESOPHAGOGASTRODUODENOSCOPY  10/29/10   Dr. Ewing Schlein   INGUINAL HERNIA REPAIR Right 1989   MELANOMA EXCISION  10/08/10  R upper arm, Dr. Daphine Deutscher   MELANOMA EXCISION  03/16/2012   Procedure: MELANOMA EXCISION;  Surgeon: Valarie Merino, MD;  Location: [facility];  Service: General;  Laterality: Right;  excision of melanoma of right arm   REMOVAL OF STONES  01/28/2019   Procedure: REMOVAL OF STONES;  Surgeon: Willis Modena, MD;  [facility]: [facility];  Service: Endoscopy;;   SPHINCTEROTOMY  01/28/2019   Procedure: Dennison Mascot;  Surgeon: Willis Modena, MD;  Location: [facility];  Service: Endoscopy;;   SPHINCTEROTOMY   04/12/2019   Procedure: Dennison Mascot;  Surgeon: Vida Rigger, MD;  Location: [facility];  Service: Endoscopy;;  stone sludge   STENT REMOVAL  04/12/2019   Procedure: STENT REMOVAL;  Surgeon: Vida Rigger, MD;  [facility]: [facility];  Service: Endoscopy;;   TOTAL KNEE ARTHROPLASTY Right 2012   TOTAL KNEE ARTHROPLASTY Left 2005    No Active Allergies  Allergies as of 06/02/2023   No Active Allergies      Medication List        Accurate as of June 02, 2023  2:40 PM. If you have any questions, ask your nurse or doctor.          acetaminophen 650 MG suppository Commonly known as: TYLENOL Place 1 suppository (650 mg total) rectally every 6 (six) hours as needed for fever or moderate pain (pain score 4-6).   amoxicillin-clavulanate 875-125 MG tablet Commonly known as: AUGMENTIN Take 1 tablet by mouth 2 (two) times daily.   docusate sodium 100 MG capsule Commonly known as: COLACE Take 1 capsule (100 mg total) by mouth 2 (two) times daily as needed for mild constipation.   Eliquis 5 MG Tabs tablet Generic drug: apixaban Take 1 tablet by mouth twice daily   levETIRAcetam 500 MG tablet Commonly known as: KEPPRA Take 1 tablet (500 mg total) by mouth 2 (two) times daily.   linezolid 600 MG tablet Commonly known as: ZYVOX Take 1 tablet (600 mg total) by mouth every 12 (twelve) hours.   loratadine 10 MG tablet Commonly known as: CLARITIN Take 10 mg by mouth daily.   LORazepam 1 MG tablet Commonly known as: ATIVAN Take 1 tablet (1 mg total) by mouth every 8 (eight) hours as needed for anxiety.   magnesium oxide 400 (240 Mg) MG tablet Commonly known as: MAG-OX Take 400 mg by mouth daily.   multivitamin with minerals tablet Take 1 tablet by mouth daily.   oxyCODONE 5 MG immediate release tablet Commonly known as: Roxicodone Take 1 tablet (5 mg total) by mouth every 6 (six) hours as needed for severe pain (pain score 7-10).   pantoprazole 40 MG tablet Commonly known  as: PROTONIX Take 1 tablet (40 mg total) by mouth daily.   polyethylene glycol powder 17 GM/SCOOP powder Commonly known as: GLYCOLAX/MIRALAX Take 1 capful (17 g) by mouth daily as needed for moderate constipation.   scopolamine 1 MG/3DAYS Commonly known as: TRANSDERM-SCOP Place 1 patch (1.5 mg total) onto the skin every 3 (three) days.   tamsulosin 0.4 MG Caps capsule Commonly known as: FLOMAX Take 1 capsule (0.4 mg total) by mouth daily.        Review of Systems  Unable to perform ROS: Patient unresponsive    Immunization History  Administered Date(s) Administered   Fluad Quad(high Dose 65+) 12/22/2018, 11/17/2019, 12/11/2020, 11/24/2021   Influenza Split 11/25/2007, 10/26/2008, 12/02/2010, 12/03/2011   Influenza, High Dose Seasonal PF 12/14/2009, 12/01/2012, 11/24/2013, 12/12/2013, 12/06/2014, 12/15/2014, 11/13/2015, 11/26/2016, 12/07/2017   Influenza-Unspecified 02/06/2023  Moderna Covid-19 Vaccine Bivalent Booster 45yrs & up 12/11/2020   Moderna SARS-COV2 Booster Vaccination 07/09/2020   Moderna Sars-Covid-2 Vaccination 02/28/2019, 03/28/2019, 01/03/2020   PNEUMOCOCCAL CONJUGATE-20 08/06/2022   Pfizer(Comirnaty)Fall Seasonal Vaccine 12 years and older 01/29/2022, 08/06/2022, 12/26/2022   Pneumococcal Conjugate-13 02/24/2013, 09/12/2013   Pneumococcal Polysaccharide-23 12/25/2004, 06/04/2011, 06/07/2011, 12/03/2016   RSV,unspecified 10/09/2022   Td 04/24/2004   Tdap 12/02/2010, 01/30/2021   Zoster Recombinant(Shingrix) 06/30/2017, 10/11/2017   Zoster, Live 10/25/2007   Pertinent  Health Maintenance Due  Topic Date Due   FOOT EXAM  08/06/2023   INFLUENZA VACCINE  09/25/2023   HEMOGLOBIN A1C  10/21/2023   OPHTHALMOLOGY EXAM  10/22/2023      01/16/2020    9:26 AM 01/30/2021    1:38 PM 02/03/2022    1:53 PM 07/02/2022    9:24 AM 03/12/2023    1:31 PM  Fall Risk  Falls in the past year? 0 0 0 0 0  Was there an injury with Fall? 0 0 0 0 0  Fall Risk Category  Calculator 0 0 0 0 0  Fall Risk Category (Retired) Low Low Low    (RETIRED) Patient Fall Risk Level  Low fall risk Low fall risk    Patient at Risk for Falls Due to  No Fall Risks No Fall Risks No Fall Risks No Fall Risks  Fall risk Follow up  Falls evaluation completed Falls evaluation completed Falls evaluation completed Falls evaluation completed   Functional Status Survey:    Vitals:   06/02/23 1417  BP: 129/66  Pulse: (!) 102  Resp: (!) 40  Temp: (!) 97.4 F (36.3 C)  SpO2: (!) 87%   There is no height or weight on file to calculate BMI. Physical Exam Vitals and nursing note reviewed.  Constitutional:      Comments: unresponsive  HENT:     Head: Normocephalic.     Mouth/Throat:     Mouth: Mucous membranes are dry.  Cardiovascular:     Rate and Rhythm: Tachycardia present.  Pulmonary:     Comments: Labored breathing, RR 40x/min Abdominal:     Palpations: Abdomen is soft.  Musculoskeletal:     Cervical back: Normal range of motion and neck supple.  Skin:    General: Skin is warm and dry.  Neurological:     Comments: unresponsive     Labs reviewed: Recent Labs    05/25/23 0428 05/26/23 0440 05/27/23 0435  NA 141 141 140  K 4.0 4.1 4.0  CL 106 106 108  CO2 25 23 23   GLUCOSE 156* 134* 163*  BUN 25* 29* 31*  CREATININE 1.06 1.04 1.05  CALCIUM 8.8* 8.8* 8.3*  MG 1.4* 1.8 1.6*  PHOS 3.8 3.6 2.6   Recent Labs    05/25/23 0428 05/26/23 0440 05/27/23 0435  AST 28 24 29   ALT 25 25 27   ALKPHOS 88 90 89  BILITOT 1.3* 1.4* 1.1  PROT 6.0* 5.7* 5.3*  ALBUMIN 2.8* 2.5* 2.2*   Recent Labs    05/25/23 0428 05/26/23 0440 05/27/23 0435  WBC 14.6* 13.9* 14.9*  NEUTROABS 10.9* 9.9* 11.4*  HGB 11.5* 12.1* 11.1*  HCT 34.1* 35.9* 33.4*  MCV 97.4 96.5 97.4  PLT 268 299 316   Lab Results  Component Value Date   TSH 3.688 05/24/2023   Lab Results  Component Value Date   HGBA1C 6.6 (H) 04/23/2023   Lab Results  Component Value Date   CHOL 101  04/23/2023   HDL 41 04/23/2023  LDLCALC 45 04/23/2023   TRIG 75 05/20/2023   CHOLHDL 2.5 04/23/2023    Significant Diagnostic Results in last 30 days:  DG Chest Port 1 View Result Date: 05/25/2023 CLINICAL DATA:  Shortness of breath. EXAM: PORTABLE CHEST 1 VIEW COMPARISON:  05/23/2023 FINDINGS: Low volume film. Cardiopericardial silhouette is at upper limits of normal for size. Streaky densities at the lung bases suggest atelectasis although superimposed pneumonia at the left base is not excluded. No acute bony abnormality. Telemetry leads overlie the chest. IMPRESSION: Low volume film with streaky densities at the lung bases, likely atelectasis. Superimposed pneumonia at the left base not excluded. Electronically Signed   By: Kennith Center M.D.   On: 05/25/2023 07:40   DG CHEST PORT 1 VIEW Result Date: 05/23/2023 CLINICAL DATA:  Pneumonia EXAM: PORTABLE CHEST 1 VIEW COMPARISON:  None Available. FINDINGS: Lungs are well expanded, symmetric, and clear. No pneumothorax or pleural effusion. Cardiac size within normal limits. Pulmonary vascularity is normal. Osseous structures are age-appropriate. No acute bone abnormality. IMPRESSION: No active disease. Electronically Signed   By: Helyn Numbers M.D.   On: 05/23/2023 23:32   DG Chest Port 1 View Result Date: 05/23/2023 CLINICAL DATA:  Shortness of breath. Atrial fibrillation EXAM: PORTABLE CHEST 1 VIEW COMPARISON:  05/19/2023. FINDINGS: Heart size appears stable. Aortic atherosclerosis. Interval removal of ET tube and enteric tube. Lung volumes are low. Pulmonary vascular congestion noted. Atelectatic changes are again seen in the lung bases. IMPRESSION: 1. Low lung volumes and bibasilar atelectasis. 2. Pulmonary vascular congestion. Electronically Signed   By: Signa Kell M.D.   On: 05/23/2023 07:59   VAS [place] LOWER EXTREMITY VENOUS (DVT) Result Date: 05/21/2023  Lower Venous DVT Study Patient Name:  JAWUAN ROBB Schalk  Date of Exam:   05/21/2023 Medical  Rec #: [id number]      Accession #:    [id number] Date of Birth: 09-26-1932      Patient Gender: M Patient Age:   60 years Exam Location:  [facility] Procedure:      VAS [place] LOWER EXTREMITY VENOUS (DVT) Referring Phys: Vilma Meckel --------------------------------------------------------------------------------  Indications: Fever, A-fib, seizure.  Anticoagulation: Eliquis. Limitations: Patient coughing throughout exam interfering with compressions and dopplers. Comparison Study: No prior exam. Performing Technologist: Fernande Bras  Examination Guidelines: A complete evaluation includes B-mode imaging, spectral Doppler, color Doppler, and power Doppler as needed of all accessible portions of each vessel. Bilateral testing is considered an integral part of a complete examination. Limited examinations for reoccurring indications may be performed as noted. The reflux portion of the exam is performed with the patient in reverse Trendelenburg.  +---------+---------------+---------+-----------+----------+--------------+ RIGHT    CompressibilityPhasicitySpontaneityPropertiesThrombus Aging +---------+---------------+---------+-----------+----------+--------------+ CFV      Full           Yes      Yes                                 +---------+---------------+---------+-----------+----------+--------------+ SFJ      Full           Yes      Yes                                 +---------+---------------+---------+-----------+----------+--------------+ FV Prox  Full                                                        +---------+---------------+---------+-----------+----------+--------------+  FV Mid   Full                                                        +---------+---------------+---------+-----------+----------+--------------+ FV DistalFull                                                         +---------+---------------+---------+-----------+----------+--------------+ PFV      Full                                                        +---------+---------------+---------+-----------+----------+--------------+ POP      Full           Yes      Yes                                 +---------+---------------+---------+-----------+----------+--------------+ PTV      Full                                                        +---------+---------------+---------+-----------+----------+--------------+ PERO     Full                                                        +---------+---------------+---------+-----------+----------+--------------+   +---------+---------------+---------+-----------+----------+--------------+ LEFT     CompressibilityPhasicitySpontaneityPropertiesThrombus Aging +---------+---------------+---------+-----------+----------+--------------+ CFV      Full           Yes      Yes                                 +---------+---------------+---------+-----------+----------+--------------+ SFJ      Full           Yes      Yes                                 +---------+---------------+---------+-----------+----------+--------------+ FV Prox  Full                                                        +---------+---------------+---------+-----------+----------+--------------+ FV Mid   Full                                                        +---------+---------------+---------+-----------+----------+--------------+  FV DistalFull                                                        +---------+---------------+---------+-----------+----------+--------------+ PFV      Full                                                        +---------+---------------+---------+-----------+----------+--------------+ POP      Full           Yes      Yes                                  +---------+---------------+---------+-----------+----------+--------------+ PTV      Full                                                        +---------+---------------+---------+-----------+----------+--------------+ PERO     Full                                                        +---------+---------------+---------+-----------+----------+--------------+     Summary: BILATERAL: - No evidence of deep vein thrombosis seen in the lower extremities, bilaterally. -No evidence of popliteal cyst, bilaterally.   *See table(s) above for measurements and observations. Electronically signed by Heath Lark on 05/21/2023 at 6:48:29 PM.    Final    DG Swallowing Func-Speech Pathology Result Date: 05/21/2023 Table formatting from the original result was not included. Modified Barium Swallow Study Patient Details Name: SUHAN PACI MRN: [id number] Date of Birth: 1932-05-22 Today's Date: 05/21/2023 HPI/PMH: HPI: Patient is an 89 y.o. male with PMH: DM, a-fib, hiatal hernia, hearing loss (bilateral hearing aids). He presented to the hospital on 05/19/23 with confusion followed by sudden onset of seizure activity. He was transported to the hospital by EMS. In ER he was not protecting his airway and was intubated. He was extubated on 04/22/23 and kept NPO awaiting SLP swallow evaluation. Clinical Impression: Clinical Impression: Patient presents with an oropharyngeal dysphagia as per this MBS with silent aspiration occuring during and after initiation of swallow with thin liquids. (PAS 8)  Penetration above the vocal cords (PAS 3) and to the vocal cords (PAS 5) that did not clear occured with thin and nectar thick liquids when swallow was delayed to level of pyriform sinus. When swallow was initiated at level of vallecular sinus or posterior laryngeal surface of the epiglottis, no penetration or aspiration observed. Cued cough was effective to expel penetrate however barium transited back into laryngeal vestibule  unless swallow was initiated immediately after cough. One instance of silent aspiration of trace amount of nectar thick liquid occured which was preciptated by delayed aspiration of thin liquid residuals. Mild to moderate amount of vallecular residuals and base of tongue residuals remained after  initial swallow with solids and liquids but with clearance of some but not all with subsequent swallows. PES opening and bolus transit through PES and upper esophagus was [facility] and no retrograde movement of barium observed. Factors that may increase risk of adverse event in presence of aspiration Rubye Oaks & Clearance Coots 2021): Factors that may increase risk of adverse event in presence of aspiration Rubye Oaks & Clearance Coots 2021): Frail or deconditioned Recommendations/Plan: Swallowing Evaluation Recommendations Swallowing Evaluation Recommendations Recommendations: PO diet PO Diet Recommendation: Dysphagia 2 (Finely chopped); Mildly thick liquids (Level 2, nectar thick) Liquid Administration via: Cup; Straw Medication Administration: Crushed with puree Supervision: Patient able to self-feed; Staff to assist with self-feeding; Full supervision/cueing for swallowing strategies Swallowing strategies  : Minimize environmental distractions; Slow rate; Small bites/sips; Clear throat intermittently Postural changes: Position pt fully upright for meals Oral care recommendations: Oral care BID (2x/day) Caregiver Recommendations: Remove water pitcher; Avoid jello, ice cream, thin soups, popsicles; Have oral suction available Treatment Plan Treatment Plan Treatment recommendations: Therapy as outlined in treatment plan below Follow-up recommendations: Skilled nursing-short term rehab (<3 hours/day) Functional status assessment: Patient has had a recent decline in their functional status and demonstrates the ability to make significant improvements in function in a reasonable and predictable amount of time. Treatment frequency: Min 2x/week Treatment  duration: 1 week Interventions: Aspiration precaution training; Trials of upgraded texture/liquids; Diet toleration management by SLP; Patient/family education; Compensatory techniques Recommendations Recommendations for follow up therapy are one component of a multi-disciplinary discharge planning process, led by the attending physician.  Recommendations may be updated based on patient status, additional functional criteria and insurance authorization. Assessment: Orofacial Exam: Orofacial Exam Oral Cavity - Dentition: Adequate natural dentition; Missing dentition; Other (Comment) (reports he has bottom partials which are likely at home) Anatomy: Anatomy: WFL Boluses Administered: Boluses Administered Boluses Administered: Thin liquids (Level 0); Mildly thick liquids (Level 2, nectar thick); Moderately thick liquids (Level 3, honey thick); Puree; Solid  Oral Impairment Domain: Oral Impairment Domain Lip Closure: No labial escape Tongue control during bolus hold: Not tested Bolus preparation/mastication: Slow prolonged chewing/mashing with complete recollection Bolus transport/lingual motion: Slow tongue motion Oral residue: Trace residue lining oral structures Location of oral residue : Tongue Initiation of pharyngeal swallow : Posterior laryngeal surface of the epiglottis; Pyriform sinuses  Pharyngeal Impairment Domain: Pharyngeal Impairment Domain Soft palate elevation: No bolus between soft palate (SP)/pharyngeal wall (PW) Laryngeal elevation: Complete superior movement of thyroid cartilage with complete approximation of arytenoids to epiglottic petiole Anterior hyoid excursion: Partial anterior movement Epiglottic movement: Complete inversion Laryngeal vestibule closure: Incomplete, narrow column air/contrast in laryngeal vestibule Pharyngeal stripping wave : Present - complete Pharyngoesophageal segment opening: Complete distension and complete duration, no obstruction of flow Tongue base retraction: No  contrast between tongue base and posterior pharyngeal wall (PPW) Pharyngeal residue: Collection of residue within or on pharyngeal structures Location of pharyngeal residue: Valleculae; Tongue base  Esophageal Impairment Domain: Esophageal Impairment Domain Esophageal clearance upright position: Complete clearance, esophageal coating Pill: No data recorded Penetration/Aspiration Scale Score: Penetration/Aspiration Scale Score 1.  Material does not enter airway: Moderately thick liquids (Level 3, honey thick); Puree; Solid 3.  Material enters airway, remains ABOVE vocal cords and not ejected out: Mildly thick liquids (Level 2, nectar thick) 5.  Material enters airway, CONTACTS cords and not ejected out: Thin liquids (Level 0); Mildly thick liquids (Level 2, nectar thick) 8.  Material enters airway, passes BELOW cords without attempt by patient to eject out (silent aspiration) : Thin liquids (Level  0) Compensatory Strategies: Compensatory Strategies Compensatory strategies: Yes Straw: Effective Effective Straw: Mildly thick liquid (Level 2, nectar thick)   General Information: No data recorded Diet Prior to this Study: Dysphagia 2 (finely chopped); Thin liquids (Level 0)   No data recorded  Respiratory Status: WFL   Supplemental O2: None (Room air)   History of Recent Intubation: Yes  Behavior/Cognition: Alert; Cooperative; Lethargic/Drowsy Self-Feeding Abilities: Able to self-feed; Needs set-up for self-feeding Baseline vocal quality/speech: Normal Volitional Cough: Able to elicit Volitional Swallow: Able to elicit Exam Limitations: No limitations Goal Planning: Prognosis for improved oropharyngeal function: Good No data recorded No data recorded Patient/Family Stated Goal: patient has been requesting PO's per RN Consulted and agree with results and recommendations: Patient Pain: Pain Assessment Pain Assessment: Faces Faces Pain Scale: 0 Breathing: 0 Negative Vocalization: 0 Facial Expression: 0 Body Language: 0  Consolability: 0 PAINAD Score: 0 Facial Expression: 0 Body Movements: 0 Muscle Tension: 0 Compliance with ventilator (intubated pts.): 0 Vocalization (extubated pts.): N/A CPOT Total: 0 End of Session: Start Time:SLP Start Time (ACUTE ONLY): 1330 Stop Time: SLP Stop Time (ACUTE ONLY): 1350 Time Calculation:SLP Time Calculation (min) (ACUTE ONLY): 20 min Charges: SLP Evaluations $ SLP Speech Visit: 1 Visit SLP Evaluations $BSS Swallow: 1 Procedure $MBS Swallow: 1 Procedure SLP visit diagnosis: SLP Visit Diagnosis: Dysphagia, oropharyngeal phase (R13.12) Past Medical History: Past Medical History: Diagnosis Date  Actinic keratosis   Dr. Terri Piedra  Atrial fibrillation [facility])   BPH (benign prostatic hyperplasia)   controlled on Hytrin  Colon polyps   Dr. Ewing Schlein  Diabetes mellitus 2004  Diverticulosis   ED (erectile dysfunction)   Gallstones   Hearing loss   high frequency  Hiatal hernia   small, noted on EGD 10/2010  LAE (left atrial enlargement) 01/2019  Moderate, Noted on ECHO  Melanoma (HCC)   right arm  Microalbuminuria   on ACEI for microalbuminuria (does NOT have HTN)  Pneumonia 01/2019  Bilateral, noted on CT  Pure hypercholesterolemia 2006  Traumatic partial tear of biceps tendon 01/2009  "popeye" injury on right  Wears glasses   Wears hearing aid in both ears   Wears partial dentures   lower  Wears partial dentures   lower Past Surgical History: Past Surgical History: Procedure Laterality Date  APPENDECTOMY  1955  BILIARY STENT PLACEMENT N/A 01/28/2019  Procedure: BILIARY STENT PLACEMENT;  Surgeon: Willis Modena, MD;  Location: [facility];  Service: Endoscopy;  Laterality: N/A;  CATARACT EXTRACTION Bilateral R 05/07/15, L 05/21/15  Dr. Dione Booze  CHOLECYSTECTOMY N/A 01/27/2019  Procedure: LAPAROSCOPIC CHOLECYSTECTOMY WITH INTRAOPEREATIVE CHOLANGIOGRAM;  Surgeon: Ovidio Kin, MD;  Location: [facility];  Service: General;  Laterality: N/A;  COLONOSCOPY  11/09, 10/29/10  Dr. Ewing Schlein  ERCP N/A 01/28/2019  Procedure: ENDOSCOPIC  RETROGRADE CHOLANGIOPANCREATOGRAPHY (ERCP);  Surgeon: Willis Modena, MD;  Location: [facility];  Service: Endoscopy;  Laterality: N/A;  ERCP N/A 04/12/2019  Procedure: ENDOSCOPIC RETROGRADE CHOLANGIOPANCREATOGRAPHY (ERCP);  Surgeon: Vida Rigger, MD;  Location: [facility];  Service: Endoscopy;  Laterality: N/A;  stent removal  ESOPHAGOGASTRODUODENOSCOPY  10/29/10  Dr. Ewing Schlein  INGUINAL HERNIA REPAIR Right 1989  MELANOMA EXCISION  10/08/10  R upper arm, Dr. Daphine Deutscher  MELANOMA EXCISION  03/16/2012  Procedure: MELANOMA EXCISION;  Surgeon: Valarie Merino, MD;  Location: [facility];  Service: General;  Laterality: Right;  excision of melanoma of right arm  REMOVAL OF STONES  01/28/2019  Procedure: REMOVAL OF STONES;  Surgeon: Willis Modena, MD;  Location: [facility];  Service: Endoscopy;;  SPHINCTEROTOMY  01/28/2019  Procedure: SPHINCTEROTOMY;  Surgeon: Willis Modena, MD;  Location: [facility];  Service: Endoscopy;;  SPHINCTEROTOMY  04/12/2019  Procedure: Dennison Mascot;  Surgeon: Vida Rigger, MD;  Location: [facility];  Service: Endoscopy;;  stone sludge  STENT REMOVAL  04/12/2019  Procedure: STENT REMOVAL;  Surgeon: Vida Rigger, MD;  Location: [facility];  Service: Endoscopy;;  TOTAL KNEE ARTHROPLASTY Right 2012  TOTAL KNEE ARTHROPLASTY Left 2005 Angela Nevin, MA, CCC-SLP Speech Therapy'  MR BRAIN W WO CONTRAST Result Date: 05/20/2023 CLINICAL DATA:  New onset seizures, confusion. EXAM: MRI HEAD WITHOUT AND WITH CONTRAST TECHNIQUE: Multiplanar, multiecho pulse sequences of the brain and surrounding structures were obtained without and with intravenous contrast. CONTRAST:  8mL GADAVIST GADOBUTROL 1 MMOL/ML IV SOLN COMPARISON:  CT head and CTA head and neck 05/19/2023, MRI head 04/22/2023. FINDINGS: Brain: No acute infarct. No evidence of intracranial hemorrhage. Scattered and confluent FLAIR signal abnormality in the periventricular and subcortical white matter. Chronic microhemorrhages in the  anterior left frontal lobe and left temporal lobe, similar to prior. 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 Generalized parenchymal volume loss. No mass lesion or midline shift. Normal appearance of midline structures. The basilar cisterns are patent. No extra-axial fluid collections. No abnormal intracranial enhancement. The hippocampi are symmetric in size. There is mild asymmetric FLAIR hyperintensity in the mesial right temporal lobe and right hippocampus. However, there is preserved internal architecture of the hippocampi. The fornices are relatively symmetric. Normal appearance of the mammillary bodies. Ventricles: Prominence of the lateral ventricles suggestive of underlying parenchymal volume loss. Vascular: Skull base flow voids are visualized. Skull and upper cervical spine: The visualized calvarium is unremarkable. Degenerative pannus noted along the dorsal aspect of the dens. Sinuses/Orbits: Mild mucosal thickening in the ethmoid sinuses. Mucosal thickening and near complete opacification of the sphenoid sinuses. Other: Small bilateral mastoid effusions. Fluid noted layering in the nasopharynx. IMPRESSION: No acute intracranial abnormality. No abnormal intracranial enhancement. Mild asymmetric FLAIR signal abnormality in the mesial right temporal lobe and right hippocampus. Internal architecture of the hippocampi appears preserved. Recommend correlation with EEG findings. Moderate chronic microvascular ischemic changes and parenchymal volume loss. 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 Bilateral sphenoid sinus disease. Small bilateral mastoid effusions. Electronically Signed   By: Emily Filbert M.D.   On: 05/20/2023 11:55   Overnight EEG with video Result Date: 05/20/2023 Charlsie Quest, MD     05/20/2023  8:48 PM Patient Name: WLADYSLAW HENRICHS MRN: [id number] Epilepsy Attending: Charlsie Quest Referring Physician/Provider: Rejeana Brock,  MD Duration: 05/19/2023 1236 to 05/20/2023 0947 Patient history:  89 y.o. male with a history of a single previous episode of syncope versus seizure who presents with witnessed convulsive activity with left gaze deviation. EEG to evaluate for seizure Level of alertness:  comatose AEDs during EEG study: LEV, propofol Technical aspects: This EEG study was done with scalp electrodes positioned according to the 10-20 International system of electrode placement. Electrical activity was reviewed with band pass filter of 1-70Hz , sensitivity of 7 uV/mm, display speed of 28mm/sec with a 60Hz  notched filter applied as appropriate. EEG data were recorded continuously and digitally stored.  Video monitoring was available and reviewed as appropriate. Description: EEG showed continuous generalized low amplitude 3 to 6 Hz theta-delta slowing. Hyperventilation and photic stimulation were not performed.   ABNORMALITY - Continuous slow, generalized IMPRESSION: This study is suggestive of severe diffuse encephalopathy, nonspecific etiology but could be related  to sedation. No seizures or epileptiform discharges were seen throughout the recording. Charlsie Quest   DG Abd Portable 1V Result Date: 05/19/2023 CLINICAL DATA:  Orogastric tube placement EXAM: PORTABLE ABDOMEN - 1 VIEW COMPARISON:  Earlier film of the same day FINDINGS: Orogastric tube extends into the decompressed stomach. Visualized lung bases clear. Aortic and splenic arterial calcifications. Cholecystectomy clips. Lower abdomen excluded. Lumbar dextroscoliosis with multilevel degenerative change IMPRESSION: Orogastric tube in the stomach. Electronically Signed   By: Corlis Leak M.D.   On: 05/19/2023 16:32   DG CHEST PORT 1 VIEW Result Date: 05/19/2023 CLINICAL DATA:  Orogastric tube and endotracheal tube. EXAM: PORTABLE CHEST 1 VIEW COMPARISON:  Chest x-ray 04/22/2023. FINDINGS: Enteric tube extends below the diaphragm, but distal tip is not included on this study.  Endotracheal tube tip is proximally 5.5 cm above the carina. The heart is enlarged. There are minimal atelectatic changes in the lung bases. Costophrenic angles are clear. There is no pneumothorax or acute fracture. IMPRESSION: 1. Endotracheal tube tip is 5.5 cm above the carina. 2. Enteric tube extends below the diaphragm, but distal tip is not included on this study. 3. Cardiomegaly. Electronically Signed   By: Darliss Cheney M.D.   On: 05/19/2023 15:15   CT ANGIO HEAD NECK W WO CM Result Date: 05/19/2023 CLINICAL DATA:  89 year old male with altered mental status. Found with left side gaze which developed into seizure activity. Atrial fibrillation, on Eliquis. EXAM: CT ANGIOGRAPHY HEAD AND NECK TECHNIQUE: Multidetector CT imaging of the head and neck was performed using the standard protocol during bolus administration of intravenous contrast. Multiplanar CT image reconstructions and MIPs were obtained to evaluate the vascular anatomy. Carotid stenosis measurements (when applicable) are obtained utilizing NASCET criteria, using the distal internal carotid diameter as the denominator. RADIATION DOSE REDUCTION: This exam was performed according to the departmental dose-optimization program which includes automated exposure control, adjustment of the mA and/or kV according to patient size and/or use of iterative reconstruction technique. CONTRAST:  75mL OMNIPAQUE IOHEXOL 350 MG/ML SOLN COMPARISON:  CT head and cervical spine 0942 hours today. FINDINGS: CTA NECK Skeleton: As reported separately today. Upper chest: Intubated. Endotracheal tube tip in good position above the carina. Enteric tube continues into the visible esophagus. Upper lungs well aerated. Other neck: Intubated and oral enteric tubes with mild retained fluid in the pharynx. Other neck soft tissue spaces are within normal limits. Aortic arch: Calcified aortic atherosclerosis.  Three vessel arch. Right carotid system: Brachiocephalic artery soft and  calcified plaque with mild tortuosity. Mild involvement of the right CCA origin. Moderate calcified plaque in the right CCA at the level of the larynx, with 65 % stenosis with respect to the distal vessel series 5, image 141. Bulky calcified plaque at the right ICA origin and bulb resulting in high-grade stenosis approaching a radiographic string sign is seen on series 5, image 111. This is along a segment of about 5 mm of the vessel as seen on series 9, image 83. The right ICA remains patent with distal bulb 50% stenosis due to additional bulky calcified plaque. Tortuosity below the skull base. Left carotid system: Mild left CCA origin plaque without staff that mild left CCA plaque without stenosis proximal to the bifurcation. Bulky calcified plaque at the left ICA origin and bulb resulting in 50 % stenosis with respect to the distal vessel. Tortuosity below the skull base. Vertebral arteries: Mild proximal right subclavian artery plaque without stenosis. Bulky calcified plaque at the right vertebral  artery origin resulting in moderate to severe stenosis on series 8, image 128. Distal to that the right vertebral artery is mildly tortuous and patent to the skull base without stenosis. Proximal left subclavian artery tortuosity. Bulky proximal left subclavian calcified plaque resulting in 70 % stenosis with respect to the distal vessel on series 6, image 383. This continues to the left vertebral artery origin with moderate to severe stenosis there on series 8, image 136. Non dominant left vertebral artery is then patent to the skull base with no additional plaque or stenosis. CTA HEAD Posterior circulation: Patent distal vertebral arteries, vertebrobasilar junction with mild tortuosity and no stenosis. Patent PICA origins, AICA origins, SCA and left PCA origin. Fetal type right PCA origin. Diminutive or absent left posterior communicating artery. Bilateral PCA branches are within normal limits. Anterior circulation:  Both ICA siphons are patent. Mild to moderate left siphon calcified plaque without stenosis. Similar mild to moderate right siphon calcified plaque without stenosis. Both siphons are ectatic, greater on the right. Normal right posterior communicating artery origin. Patent carotid termini. Patent MCA and ACA origins. Anterior communicating artery is normal. Left ACA A2 segment is mildly dominant. Bilateral ACA branches are within normal limits. Left MCA M1 segment and bifurcation are patent with tortuosity. Right MCA M1 segment and bifurcation are patent with mild tortuosity. Bilateral MCA branches are patent with mild tortuosity. No branch stenosis or occlusion is identified. Venous sinuses: Not evaluated due to early contrast timing. Anatomic variants: Dominant right vertebral artery. Fetal type right PCA origin. Dominant left ACA A2. Review of the MIP images confirms the above findings IMPRESSION: 1. Negative for large vessel occlusion. 2. Generalized intracranial artery tortuosity but no significant intracranial atherosclerosis. But positive for Bulky Extracranial Atherosclerosis with multifocal High-grade stenoses: - Severe stenosis Right ICA origin approaching a RADIOGRAPHIC STRING SIGN. - superimposed 65% stenosis mid Right CCA. - 70% stenosis proximal Left Subclavian Artery. - Moderate to Severe stenosis at Both Vertebral Artery origins. 3.  Aortic Atherosclerosis (ICD10-I70.0). Electronically Signed   By: Odessa Fleming M.D.   On: 05/19/2023 10:39   CT Head Wo Contrast Result Date: 05/19/2023 CLINICAL DATA:  Mental status change with unknown cause. Altered mental status EXAM: CT HEAD WITHOUT CONTRAST CT CERVICAL SPINE WITHOUT CONTRAST TECHNIQUE: Multidetector CT imaging of the head and cervical spine was performed following the standard protocol without intravenous contrast. Multiplanar CT image reconstructions of the cervical spine were also generated. RADIATION DOSE REDUCTION: This exam was performed according  to the departmental dose-optimization program which includes automated exposure control, adjustment of the mA and/or kV according to patient size and/or use of iterative reconstruction technique. COMPARISON:  Brain MRI 04/22/2023 FINDINGS: CT HEAD FINDINGS Brain: No evidence of acute infarction, hemorrhage, hydrocephalus, extra-axial collection or mass lesion/mass effect. Generalized brain atrophy Vascular: No hyperdense vessel or unexpected calcification. Skull: Normal. Negative for fracture or focal lesion. Sinuses/Orbits: No acute finding. Chronic opacification of paranasal sinuses, especially the ethmoids. CT CERVICAL SPINE FINDINGS Alignment: C6-7 mild anterolisthesis.  No traumatic malalignment. Skull base and vertebrae: No acute fracture. No primary bone lesion or focal pathologic process. Soft tissues and spinal canal: No prevertebral fluid or swelling. No visible canal hematoma. Unremarkable intubation hardware where traversing the neck. Disc levels:  Generalized degenerative endplate and facet spurring. Upper chest: Clear apical lungs IMPRESSION: No evidence of acute intracranial or cervical spine injury. Electronically Signed   By: Tiburcio Pea M.D.   On: 05/19/2023 09:56   CT Cervical Spine Wo Contrast  Result Date: 05/19/2023 CLINICAL DATA:  Mental status change with unknown cause. Altered mental status EXAM: CT HEAD WITHOUT CONTRAST CT CERVICAL SPINE WITHOUT CONTRAST TECHNIQUE: Multidetector CT imaging of the head and cervical spine was performed following the standard protocol without intravenous contrast. Multiplanar CT image reconstructions of the cervical spine were also generated. RADIATION DOSE REDUCTION: This exam was performed according to the departmental dose-optimization program which includes automated exposure control, adjustment of the mA and/or kV according to patient size and/or use of iterative reconstruction technique. COMPARISON:  Brain MRI 04/22/2023 FINDINGS: CT HEAD FINDINGS  Brain: No evidence of acute infarction, hemorrhage, hydrocephalus, extra-axial collection or mass lesion/mass effect. Generalized brain atrophy Vascular: No hyperdense vessel or unexpected calcification. Skull: Normal. Negative for fracture or focal lesion. Sinuses/Orbits: No acute finding. Chronic opacification of paranasal sinuses, especially the ethmoids. CT CERVICAL SPINE FINDINGS Alignment: C6-7 mild anterolisthesis.  No traumatic malalignment. Skull base and vertebrae: No acute fracture. No primary bone lesion or focal pathologic process. Soft tissues and spinal canal: No prevertebral fluid or swelling. No visible canal hematoma. Unremarkable intubation hardware where traversing the neck. Disc levels:  Generalized degenerative endplate and facet spurring. Upper chest: Clear apical lungs IMPRESSION: No evidence of acute intracranial or cervical spine injury. Electronically Signed   By: Tiburcio Pea M.D.   On: 05/19/2023 09:56    Assessment/Plan: Adult failure to thrive Comfort measures, under Hospice care, will have Morphine 5mg , Lorazepam 1mg , Atropine ophthalmic sol SL q4h prn.   Respiratory failure (HCC) Tachypnea 40/min, hypoxia even if with O2 [place], goal of care is comfort measures, will have prn Morphine and Atropine ophthalmic sol SL available to him.   Unresponsiveness Supportive care, dc po meds for now.     Family/ staff Communication: plan of care reviewed with the patient and charge nurse.   Labs/tests ordered:  none

## 2023-06-22 ENCOUNTER — Telehealth: Payer: Self-pay | Admitting: Family Medicine

## 2023-06-22 NOTE — Telephone Encounter (Signed)
Sympathy card sent 

## 2023-06-25 DEATH — deceased

## 2023-06-30 ENCOUNTER — Ambulatory Visit: Admitting: Diagnostic Neuroimaging

## 2023-09-09 ENCOUNTER — Other Ambulatory Visit: Payer: Medicare Other

## 2023-09-14 ENCOUNTER — Encounter: Payer: Medicare Other | Admitting: Family Medicine
